# Patient Record
Sex: Female | Born: 1935
Health system: Southern US, Community
[De-identification: ages and names within clinical notes are randomized; demographics above are authoritative.]

## PROBLEM LIST (undated history)

## (undated) DIAGNOSIS — E785 Hyperlipidemia, unspecified: Secondary | ICD-10-CM

## (undated) DIAGNOSIS — H409 Unspecified glaucoma: Secondary | ICD-10-CM

## (undated) DIAGNOSIS — N183 Chronic kidney disease, stage 3 unspecified: Secondary | ICD-10-CM

## (undated) DIAGNOSIS — I10 Essential (primary) hypertension: Secondary | ICD-10-CM

## (undated) DIAGNOSIS — E119 Type 2 diabetes mellitus without complications: Secondary | ICD-10-CM

## (undated) DIAGNOSIS — H547 Unspecified visual loss: Secondary | ICD-10-CM

## (undated) DIAGNOSIS — E7800 Pure hypercholesterolemia, unspecified: Secondary | ICD-10-CM

## (undated) DIAGNOSIS — M899 Disorder of bone, unspecified: Secondary | ICD-10-CM

## (undated) DIAGNOSIS — M949 Disorder of cartilage, unspecified: Secondary | ICD-10-CM

## (undated) DIAGNOSIS — E669 Obesity, unspecified: Secondary | ICD-10-CM

## (undated) DIAGNOSIS — H9190 Unspecified hearing loss, unspecified ear: Secondary | ICD-10-CM

## (undated) DIAGNOSIS — J310 Chronic rhinitis: Secondary | ICD-10-CM

## (undated) HISTORY — DX: Unspecified glaucoma: H40.9

## (undated) HISTORY — DX: Unspecified hearing loss, unspecified ear: H91.90

## (undated) HISTORY — DX: Essential (primary) hypertension: I10

## (undated) HISTORY — DX: Unspecified visual loss: H54.7

## (undated) HISTORY — DX: Disorder of cartilage, unspecified: M94.9

## (undated) HISTORY — DX: Type 2 diabetes mellitus without complications: E11.9

## (undated) HISTORY — DX: Obesity, unspecified: E66.9

## (undated) HISTORY — DX: Hyperlipidemia, unspecified: E78.5

## (undated) HISTORY — PX: TOTAL ABDOMINAL HYSTERECTOMY: SHX209

## (undated) HISTORY — DX: Pure hypercholesterolemia, unspecified: E78.00

## (undated) HISTORY — DX: Chronic rhinitis: J31.0

## (undated) HISTORY — DX: Disorder of bone, unspecified: M89.9

---

## 1997-11-18 ENCOUNTER — Other Ambulatory Visit: Admission: RE | Admit: 1997-11-18 | Discharge: 1997-11-18 | Payer: Self-pay | Admitting: *Deleted

## 2000-11-01 ENCOUNTER — Encounter: Admission: RE | Admit: 2000-11-01 | Discharge: 2000-11-01 | Payer: Self-pay | Admitting: Internal Medicine

## 2001-03-07 ENCOUNTER — Encounter: Admission: RE | Admit: 2001-03-07 | Discharge: 2001-06-05 | Payer: Self-pay | Admitting: Internal Medicine

## 2004-04-04 ENCOUNTER — Ambulatory Visit: Payer: Self-pay | Admitting: Internal Medicine

## 2004-05-17 ENCOUNTER — Ambulatory Visit: Payer: Self-pay | Admitting: Internal Medicine

## 2004-05-23 ENCOUNTER — Ambulatory Visit: Payer: Self-pay | Admitting: Internal Medicine

## 2004-05-24 ENCOUNTER — Ambulatory Visit: Payer: Self-pay | Admitting: Internal Medicine

## 2004-07-25 ENCOUNTER — Ambulatory Visit: Payer: Self-pay | Admitting: Internal Medicine

## 2004-08-01 ENCOUNTER — Ambulatory Visit: Payer: Self-pay | Admitting: Internal Medicine

## 2004-08-08 ENCOUNTER — Ambulatory Visit: Payer: Self-pay | Admitting: Internal Medicine

## 2004-08-22 ENCOUNTER — Ambulatory Visit: Payer: Self-pay | Admitting: Internal Medicine

## 2004-08-23 ENCOUNTER — Ambulatory Visit: Payer: Self-pay | Admitting: Internal Medicine

## 2004-10-03 ENCOUNTER — Ambulatory Visit: Payer: Self-pay | Admitting: Internal Medicine

## 2004-10-06 ENCOUNTER — Ambulatory Visit: Payer: Self-pay | Admitting: Internal Medicine

## 2004-11-18 ENCOUNTER — Ambulatory Visit: Payer: Self-pay | Admitting: Internal Medicine

## 2004-11-21 ENCOUNTER — Ambulatory Visit: Payer: Self-pay | Admitting: Pulmonary Disease

## 2004-12-02 ENCOUNTER — Ambulatory Visit: Payer: Self-pay | Admitting: Pulmonary Disease

## 2004-12-08 ENCOUNTER — Ambulatory Visit: Payer: Self-pay | Admitting: Pulmonary Disease

## 2004-12-30 ENCOUNTER — Ambulatory Visit: Payer: Self-pay | Admitting: Internal Medicine

## 2005-01-06 ENCOUNTER — Ambulatory Visit: Payer: Self-pay | Admitting: Internal Medicine

## 2005-01-10 ENCOUNTER — Ambulatory Visit: Payer: Self-pay | Admitting: Cardiovascular Disease

## 2005-01-12 ENCOUNTER — Ambulatory Visit: Payer: Self-pay | Admitting: Internal Medicine

## 2005-01-23 ENCOUNTER — Ambulatory Visit: Payer: Self-pay | Admitting: Internal Medicine

## 2005-02-10 ENCOUNTER — Ambulatory Visit: Payer: Self-pay | Admitting: Internal Medicine

## 2005-02-13 ENCOUNTER — Ambulatory Visit: Payer: Self-pay | Admitting: Internal Medicine

## 2005-04-17 ENCOUNTER — Ambulatory Visit: Payer: Self-pay | Admitting: Internal Medicine

## 2005-07-12 ENCOUNTER — Encounter: Admission: RE | Admit: 2005-07-12 | Discharge: 2005-07-12 | Payer: Self-pay | Admitting: Internal Medicine

## 2005-07-27 ENCOUNTER — Ambulatory Visit: Payer: Self-pay | Admitting: Internal Medicine

## 2005-07-28 ENCOUNTER — Ambulatory Visit: Payer: Self-pay | Admitting: Internal Medicine

## 2005-11-03 ENCOUNTER — Ambulatory Visit: Payer: Self-pay | Admitting: Internal Medicine

## 2005-12-11 ENCOUNTER — Ambulatory Visit: Payer: Self-pay | Admitting: Internal Medicine

## 2006-01-29 ENCOUNTER — Ambulatory Visit: Payer: Self-pay | Admitting: Internal Medicine

## 2006-04-23 ENCOUNTER — Ambulatory Visit: Payer: Self-pay | Admitting: Internal Medicine

## 2006-04-23 LAB — CONVERTED CEMR LAB
ALT: 16 units/L (ref 0–40)
AST: 18 units/L (ref 0–37)
Albumin: 3.8 g/dL (ref 3.5–5.2)
Alkaline Phosphatase: 33 units/L — ABNORMAL LOW (ref 39–117)
BUN: 12 mg/dL (ref 6–23)
Bacteria, U Microscopic: NEGATIVE /hpf
Basophils Absolute: 0 10*3/uL (ref 0.0–0.1)
Basophils Relative: 0.6 % (ref 0.0–1.0)
Bilirubin Urine: NEGATIVE
CO2: 29 meq/L (ref 19–32)
Calcium: 9.2 mg/dL (ref 8.4–10.5)
Chloride: 107 meq/L (ref 96–112)
Chol/HDL Ratio, serum: 3.1
Cholesterol: 198 mg/dL (ref 0–200)
Creatinine, Ser: 0.7 mg/dL (ref 0.4–1.2)
Crystals: NEGATIVE
Eosinophil percent: 4.9 % (ref 0.0–5.0)
GFR calc non Af Amer: 86 mL/min
Glomerular Filtration Rate, Af Am: 104 mL/min/{1.73_m2}
Glucose, Bld: 126 mg/dL — ABNORMAL HIGH (ref 70–99)
HCT: 37.1 % (ref 36.0–46.0)
HDL: 63 mg/dL (ref >39.0)
Hemoglobin, Urine: NEGATIVE
Hemoglobin: 12.5 g/dL (ref 12.0–15.0)
Hgb A1c MFr Bld: 6.9 % — ABNORMAL HIGH (ref 4.6–6.0)
Ketones, ur: NEGATIVE mg/dL
LDL Cholesterol: 103 mg/dL — ABNORMAL HIGH (ref 0–99)
Lymphocytes Relative: 25.9 % (ref 12.0–46.0)
MCHC: 33.6 g/dL (ref 30.0–36.0)
MCV: 89.3 fL (ref 78.0–100.0)
Monocytes Absolute: 0.4 10*3/uL (ref 0.2–0.7)
Monocytes Relative: 8 % (ref 3.0–11.0)
Mucus, UA: NEGATIVE
Neutro Abs: 3.2 10*3/uL (ref 1.4–7.7)
Neutrophils Relative %: 60.6 % (ref 43.0–77.0)
Nitrite: NEGATIVE
Platelets: 206 10*3/uL (ref 150–400)
Potassium: 4.4 meq/L (ref 3.5–5.1)
RBC: 4.15 M/uL (ref 3.87–5.11)
RDW: 12.8 % (ref 11.5–14.6)
Sodium: 143 meq/L (ref 135–145)
Specific Gravity, Urine: 1.015 (ref 1.000–1.03)
TSH: 2.68 microintl units/mL (ref 0.35–5.50)
Total Bilirubin: 0.7 mg/dL (ref 0.3–1.2)
Total Protein, Urine: NEGATIVE mg/dL
Total Protein: 6.5 g/dL (ref 6.0–8.3)
Triglyceride fasting, serum: 160 mg/dL — ABNORMAL HIGH (ref 0–149)
Urine Glucose: NEGATIVE mg/dL
Urobilinogen, UA: 0.2 (ref 0.0–1.0)
VLDL: 32 mg/dL (ref 0–40)
WBC: 5.3 10*3/uL (ref 4.5–10.5)
pH: 6 (ref 5.0–8.0)

## 2006-05-24 ENCOUNTER — Ambulatory Visit: Payer: Self-pay | Admitting: Internal Medicine

## 2006-08-13 ENCOUNTER — Ambulatory Visit: Payer: Self-pay | Admitting: Internal Medicine

## 2006-08-13 LAB — CONVERTED CEMR LAB
Chloride: 103 meq/L (ref 96–112)
Cholesterol: 247 mg/dL (ref 0–200)
Creatinine, Ser: 0.6 mg/dL (ref 0.4–1.2)
Glucose, Bld: 144 mg/dL — ABNORMAL HIGH (ref 70–99)
Hgb A1c MFr Bld: 7 % — ABNORMAL HIGH (ref 4.6–6.0)
Sodium: 139 meq/L (ref 135–145)
VLDL: 44 mg/dL — ABNORMAL HIGH (ref 0–40)

## 2006-11-19 ENCOUNTER — Ambulatory Visit: Payer: Self-pay | Admitting: Internal Medicine

## 2006-11-19 LAB — CONVERTED CEMR LAB
BUN: 10 mg/dL (ref 6–23)
Basophils Absolute: 0 10*3/uL (ref 0.0–0.1)
Bilirubin, Direct: 0.1 mg/dL (ref 0.0–0.3)
CRP, High Sensitivity: 1 (ref 0.00–5.00)
Calcium: 9.2 mg/dL (ref 8.4–10.5)
Chloride: 111 meq/L (ref 96–112)
Direct LDL: 118.6 mg/dL
Eosinophils Absolute: 0.2 10*3/uL (ref 0.0–0.6)
Eosinophils Relative: 3.3 % (ref 0.0–5.0)
GFR calc Af Amer: 124 mL/min
Glucose, Bld: 141 mg/dL — ABNORMAL HIGH (ref 70–99)
HDL: 56.4 mg/dL (ref >39.0)
Hgb A1c MFr Bld: 7 % — ABNORMAL HIGH (ref 4.6–6.0)
MCHC: 34.2 g/dL (ref 30.0–36.0)
MCV: 87 fL (ref 78.0–100.0)
Platelets: 201 10*3/uL (ref 150–400)
Potassium: 4.3 meq/L (ref 3.5–5.1)
RBC: 4.12 M/uL (ref 3.87–5.11)
Sodium: 143 meq/L (ref 135–145)
Total Bilirubin: 0.8 mg/dL (ref 0.3–1.2)
Total Protein: 6.5 g/dL (ref 6.0–8.3)
Triglycerides: 203 mg/dL (ref 0–149)
WBC: 6.2 10*3/uL (ref 4.5–10.5)

## 2007-02-28 ENCOUNTER — Ambulatory Visit: Payer: Self-pay | Admitting: Internal Medicine

## 2007-02-28 LAB — CONVERTED CEMR LAB
Cholesterol: 219 mg/dL (ref 0–200)
Direct LDL: 127.6 mg/dL
Hgb A1c MFr Bld: 6.9 % — ABNORMAL HIGH (ref 4.6–6.0)
Total CHOL/HDL Ratio: 3.8
VLDL: 38 mg/dL (ref 0–40)
Vit D, 1,25-Dihydroxy: 43 (ref 30–89)

## 2007-06-10 DIAGNOSIS — E119 Type 2 diabetes mellitus without complications: Secondary | ICD-10-CM | POA: Insufficient documentation

## 2007-06-10 DIAGNOSIS — Z9189 Other specified personal risk factors, not elsewhere classified: Secondary | ICD-10-CM

## 2007-06-10 DIAGNOSIS — E1169 Type 2 diabetes mellitus with other specified complication: Secondary | ICD-10-CM | POA: Insufficient documentation

## 2007-06-10 DIAGNOSIS — E785 Hyperlipidemia, unspecified: Secondary | ICD-10-CM

## 2007-06-10 DIAGNOSIS — E7800 Pure hypercholesterolemia, unspecified: Secondary | ICD-10-CM | POA: Insufficient documentation

## 2007-06-10 DIAGNOSIS — E669 Obesity, unspecified: Secondary | ICD-10-CM

## 2007-06-10 DIAGNOSIS — J310 Chronic rhinitis: Secondary | ICD-10-CM

## 2007-06-11 ENCOUNTER — Ambulatory Visit: Payer: Self-pay | Admitting: Internal Medicine

## 2007-06-11 LAB — CONVERTED CEMR LAB
Creatinine, Ser: 0.5 mg/dL (ref 0.4–1.2)
GFR calc Af Amer: 153 mL/min
Hgb A1c MFr Bld: 7 % — ABNORMAL HIGH (ref 4.6–6.0)
Potassium: 4.6 meq/L (ref 3.5–5.1)
Sodium: 140 meq/L (ref 135–145)

## 2007-06-24 ENCOUNTER — Telehealth (INDEPENDENT_AMBULATORY_CARE_PROVIDER_SITE_OTHER): Payer: Self-pay | Admitting: *Deleted

## 2007-09-05 ENCOUNTER — Encounter: Payer: Self-pay | Admitting: Internal Medicine

## 2007-09-12 ENCOUNTER — Ambulatory Visit: Payer: Self-pay | Admitting: Internal Medicine

## 2007-09-12 DIAGNOSIS — I10 Essential (primary) hypertension: Secondary | ICD-10-CM | POA: Insufficient documentation

## 2007-09-19 ENCOUNTER — Ambulatory Visit: Payer: Self-pay | Admitting: Internal Medicine

## 2007-09-19 DIAGNOSIS — J209 Acute bronchitis, unspecified: Secondary | ICD-10-CM | POA: Insufficient documentation

## 2007-12-30 ENCOUNTER — Ambulatory Visit: Payer: Self-pay | Admitting: Internal Medicine

## 2007-12-30 ENCOUNTER — Encounter: Payer: Self-pay | Admitting: Internal Medicine

## 2007-12-31 ENCOUNTER — Ambulatory Visit: Payer: Self-pay | Admitting: Internal Medicine

## 2008-01-01 LAB — CONVERTED CEMR LAB
ALT: 16 units/L (ref 0–35)
AST: 17 units/L (ref 0–37)
Albumin: 3.5 g/dL (ref 3.5–5.2)
Alkaline Phosphatase: 39 units/L (ref 39–117)
BUN: 14 mg/dL (ref 6–23)
Bilirubin, Direct: 0.1 mg/dL (ref 0.0–0.3)
CO2: 30 meq/L (ref 19–32)
Calcium: 9.1 mg/dL (ref 8.4–10.5)
Chloride: 103 meq/L (ref 96–112)
Cholesterol: 190 mg/dL (ref 0–200)
Creatinine, Ser: 0.6 mg/dL (ref 0.4–1.2)
GFR calc Af Amer: 124 mL/min
GFR calc non Af Amer: 102 mL/min
Glucose, Bld: 146 mg/dL — ABNORMAL HIGH (ref 70–99)
HDL: 50 mg/dL (ref >39.0)
Hgb A1c MFr Bld: 7.3 % — ABNORMAL HIGH (ref 4.6–6.0)
LDL Cholesterol: 111 mg/dL — ABNORMAL HIGH (ref 0–99)
Potassium: 4.3 meq/L (ref 3.5–5.1)
Sodium: 140 meq/L (ref 135–145)
Total Bilirubin: 0.8 mg/dL (ref 0.3–1.2)
Total CHOL/HDL Ratio: 3.8
Total Protein: 6.3 g/dL (ref 6.0–8.3)
Triglycerides: 146 mg/dL (ref 0–149)
VLDL: 29 mg/dL (ref 0–40)

## 2008-01-03 LAB — CONVERTED CEMR LAB: Vit D, 1,25-Dihydroxy: 37 (ref 30–89)

## 2008-04-02 ENCOUNTER — Ambulatory Visit: Payer: Self-pay | Admitting: Internal Medicine

## 2008-04-02 LAB — CONVERTED CEMR LAB
BUN: 16 mg/dL (ref 6–23)
CO2: 28 meq/L (ref 19–32)
Calcium: 10.5 mg/dL (ref 8.4–10.5)
Chloride: 103 meq/L (ref 96–112)
Creatinine, Ser: 0.6 mg/dL (ref 0.4–1.2)
GFR calc Af Amer: 124 mL/min
GFR calc non Af Amer: 102 mL/min
Glucose, Bld: 176 mg/dL — ABNORMAL HIGH (ref 70–99)
Hgb A1c MFr Bld: 7.2 % — ABNORMAL HIGH (ref 4.6–6.0)
Potassium: 4.9 meq/L (ref 3.5–5.1)
Sodium: 144 meq/L (ref 135–145)

## 2008-05-05 ENCOUNTER — Ambulatory Visit: Payer: Self-pay | Admitting: Internal Medicine

## 2008-06-04 ENCOUNTER — Emergency Department (HOSPITAL_COMMUNITY): Admission: EM | Admit: 2008-06-04 | Discharge: 2008-06-04 | Payer: Self-pay | Admitting: Emergency Medicine

## 2008-06-05 ENCOUNTER — Telehealth (INDEPENDENT_AMBULATORY_CARE_PROVIDER_SITE_OTHER): Payer: Self-pay | Admitting: *Deleted

## 2008-06-09 ENCOUNTER — Ambulatory Visit: Payer: Self-pay | Admitting: Internal Medicine

## 2008-06-09 DIAGNOSIS — N3000 Acute cystitis without hematuria: Secondary | ICD-10-CM

## 2008-06-11 LAB — CONVERTED CEMR LAB
Bilirubin Urine: NEGATIVE
Mucus, UA: NEGATIVE
Nitrite: NEGATIVE
RBC / HPF: NONE SEEN
Specific Gravity, Urine: 1.01 (ref 1.000–1.03)
Total Protein, Urine: NEGATIVE mg/dL
pH: 6 (ref 5.0–8.0)

## 2008-06-24 ENCOUNTER — Emergency Department (HOSPITAL_COMMUNITY): Admission: EM | Admit: 2008-06-24 | Discharge: 2008-06-24 | Payer: Self-pay | Admitting: Emergency Medicine

## 2008-06-25 ENCOUNTER — Telehealth: Payer: Self-pay | Admitting: Adult Health

## 2008-06-25 DIAGNOSIS — R1900 Intra-abdominal and pelvic swelling, mass and lump, unspecified site: Secondary | ICD-10-CM | POA: Insufficient documentation

## 2008-07-10 ENCOUNTER — Encounter: Payer: Self-pay | Admitting: Internal Medicine

## 2008-07-13 ENCOUNTER — Inpatient Hospital Stay (HOSPITAL_COMMUNITY): Admission: RE | Admit: 2008-07-13 | Discharge: 2008-07-15 | Payer: Self-pay | Admitting: Obstetrics and Gynecology

## 2008-07-13 ENCOUNTER — Encounter (INDEPENDENT_AMBULATORY_CARE_PROVIDER_SITE_OTHER): Payer: Self-pay | Admitting: Obstetrics and Gynecology

## 2008-08-04 ENCOUNTER — Ambulatory Visit: Payer: Self-pay | Admitting: Internal Medicine

## 2008-08-04 DIAGNOSIS — J381 Polyp of vocal cord and larynx: Secondary | ICD-10-CM

## 2008-08-18 ENCOUNTER — Ambulatory Visit: Payer: Self-pay | Admitting: Internal Medicine

## 2008-08-18 ENCOUNTER — Telehealth (INDEPENDENT_AMBULATORY_CARE_PROVIDER_SITE_OTHER): Payer: Self-pay | Admitting: *Deleted

## 2008-09-22 ENCOUNTER — Encounter: Payer: Self-pay | Admitting: Internal Medicine

## 2008-11-05 ENCOUNTER — Ambulatory Visit: Payer: Self-pay | Admitting: Internal Medicine

## 2008-11-05 LAB — CONVERTED CEMR LAB
Albumin: 3.9 g/dL (ref 3.5–5.2)
BUN: 14 mg/dL (ref 6–23)
Basophils Relative: 0.4 % (ref 0.0–3.0)
CRP, High Sensitivity: 2 (ref 0.00–5.00)
Calcium: 9.4 mg/dL (ref 8.4–10.5)
Cholesterol: 225 mg/dL — ABNORMAL HIGH (ref 0–200)
Creatinine, Ser: 0.5 mg/dL (ref 0.4–1.2)
Direct LDL: 123 mg/dL
Eosinophils Relative: 3.6 % (ref 0.0–5.0)
GFR calc non Af Amer: 125.76 mL/min (ref >60)
Glucose, Bld: 123 mg/dL — ABNORMAL HIGH (ref 70–99)
HCT: 34 % — ABNORMAL LOW (ref 36.0–46.0)
HDL: 63.2 mg/dL (ref >39.00)
Hemoglobin, Urine: NEGATIVE
Lymphs Abs: 1.5 10*3/uL (ref 0.7–4.0)
MCV: 87.6 fL (ref 78.0–100.0)
Monocytes Absolute: 0.4 10*3/uL (ref 0.1–1.0)
Monocytes Relative: 7.4 % (ref 3.0–12.0)
Nitrite: NEGATIVE
RBC: 3.89 M/uL (ref 3.87–5.11)
Specific Gravity, Urine: 1.01 (ref 1.000–1.030)
TSH: 2.96 microintl units/mL (ref 0.35–5.50)
Total Bilirubin: 0.9 mg/dL (ref 0.3–1.2)
Total Protein, Urine: NEGATIVE mg/dL
Triglycerides: 262 mg/dL — ABNORMAL HIGH (ref 0.0–149.0)
Urine Glucose: NEGATIVE mg/dL
Urobilinogen, UA: 0.2 (ref 0.0–1.0)
VLDL: 52.4 mg/dL — ABNORMAL HIGH (ref 0.0–40.0)
WBC: 5.5 10*3/uL (ref 4.5–10.5)

## 2009-02-02 ENCOUNTER — Ambulatory Visit: Payer: Self-pay | Admitting: Internal Medicine

## 2009-02-02 LAB — CONVERTED CEMR LAB
Albumin: 3.8 g/dL (ref 3.5–5.2)
BUN: 16 mg/dL (ref 6–23)
Calcium: 9.5 mg/dL (ref 8.4–10.5)
Cholesterol: 245 mg/dL — ABNORMAL HIGH (ref 0–200)
GFR calc non Af Amer: 125.69 mL/min (ref >60)
Glucose, Bld: 131 mg/dL — ABNORMAL HIGH (ref 70–99)
HDL: 52.4 mg/dL (ref >39.00)
Potassium: 4.5 meq/L (ref 3.5–5.1)
Total Bilirubin: 0.8 mg/dL (ref 0.3–1.2)
Total CHOL/HDL Ratio: 5
Triglycerides: 226 mg/dL — ABNORMAL HIGH (ref 0.0–149.0)
VLDL: 45.2 mg/dL — ABNORMAL HIGH (ref 0.0–40.0)

## 2009-03-12 ENCOUNTER — Ambulatory Visit: Payer: Self-pay | Admitting: Internal Medicine

## 2009-03-17 ENCOUNTER — Telehealth: Payer: Self-pay | Admitting: Internal Medicine

## 2009-03-17 ENCOUNTER — Ambulatory Visit: Payer: Self-pay | Admitting: Internal Medicine

## 2009-03-22 ENCOUNTER — Telehealth: Payer: Self-pay | Admitting: Internal Medicine

## 2009-04-23 ENCOUNTER — Encounter: Payer: Self-pay | Admitting: Internal Medicine

## 2009-05-28 ENCOUNTER — Ambulatory Visit: Payer: Self-pay | Admitting: Internal Medicine

## 2009-05-31 LAB — CONVERTED CEMR LAB
Albumin: 4.2 g/dL (ref 3.5–5.2)
Alkaline Phosphatase: 43 units/L (ref 39–117)
BUN: 17 mg/dL (ref 6–23)
Bilirubin, Direct: 0.1 mg/dL (ref 0.0–0.3)
CO2: 18 meq/L — ABNORMAL LOW (ref 19–32)
Calcium: 10 mg/dL (ref 8.4–10.5)
Chloride: 102 meq/L (ref 96–112)
Creatinine, Ser: 0.58 mg/dL (ref 0.40–1.20)
Glucose, Bld: 118 mg/dL — ABNORMAL HIGH (ref 70–99)
HDL: 57 mg/dL (ref >39)
LDL Cholesterol: 114 mg/dL — ABNORMAL HIGH (ref 0–99)
Total Bilirubin: 0.5 mg/dL (ref 0.3–1.2)

## 2009-08-06 ENCOUNTER — Ambulatory Visit: Payer: Self-pay | Admitting: Internal Medicine

## 2009-08-06 DIAGNOSIS — J4520 Mild intermittent asthma, uncomplicated: Secondary | ICD-10-CM | POA: Insufficient documentation

## 2009-08-27 ENCOUNTER — Ambulatory Visit: Payer: Self-pay | Admitting: Internal Medicine

## 2009-08-30 LAB — CONVERTED CEMR LAB
ALT: 17 units/L (ref 0–35)
AST: 17 units/L (ref 0–37)
BUN: 12 mg/dL (ref 6–23)
Bilirubin, Direct: 0.1 mg/dL (ref 0.0–0.3)
CO2: 30 meq/L (ref 19–32)
Chloride: 102 meq/L (ref 96–112)
Cholesterol: 276 mg/dL — ABNORMAL HIGH (ref 0–200)
Hgb A1c MFr Bld: 7.2 % — ABNORMAL HIGH (ref 4.6–6.5)
Potassium: 4.3 meq/L (ref 3.5–5.1)
Total Bilirubin: 0.6 mg/dL (ref 0.3–1.2)
Total CHOL/HDL Ratio: 5
Total Protein: 6.8 g/dL (ref 6.0–8.3)

## 2009-11-25 ENCOUNTER — Ambulatory Visit: Payer: Self-pay | Admitting: Internal Medicine

## 2009-11-25 LAB — CONVERTED CEMR LAB
Albumin: 4.3 g/dL (ref 3.5–5.2)
Alkaline Phosphatase: 40 units/L (ref 39–117)
Basophils Absolute: 0 10*3/uL (ref 0.0–0.1)
Bilirubin, Direct: 0.1 mg/dL (ref 0.0–0.3)
CO2: 30 meq/L (ref 19–32)
Calcium: 9.6 mg/dL (ref 8.4–10.5)
Cholesterol: 305 mg/dL — ABNORMAL HIGH (ref 0–200)
Creatinine, Ser: 0.5 mg/dL (ref 0.4–1.2)
Creatinine,U: 64.2 mg/dL
Eosinophils Absolute: 0.3 10*3/uL (ref 0.0–0.7)
Glucose, Bld: 124 mg/dL — ABNORMAL HIGH (ref 70–99)
HDL: 72.6 mg/dL (ref >39.00)
Ketones, ur: NEGATIVE mg/dL
Lymphocytes Relative: 22.8 % (ref 12.0–46.0)
MCHC: 34.8 g/dL (ref 30.0–36.0)
Microalb Creat Ratio: 0.9 mg/g (ref 0.0–30.0)
Neutrophils Relative %: 66 % (ref 43.0–77.0)
Platelets: 206 10*3/uL (ref 150.0–400.0)
RDW: 13.6 % (ref 11.5–14.6)
Specific Gravity, Urine: 1.015 (ref 1.000–1.030)
Total Bilirubin: 0.7 mg/dL (ref 0.3–1.2)
Triglycerides: 316 mg/dL — ABNORMAL HIGH (ref 0.0–149.0)
Urobilinogen, UA: 0.2 (ref 0.0–1.0)

## 2010-01-18 IMAGING — CR DG RIBS 2V*L*
1 series · 1 of 1 positions shown · non-contrast
Comparison: 04/23/2006

CLINICAL DATA: Left chest pain

LEFT RIBS - 2 VIEW

[view not recorded]
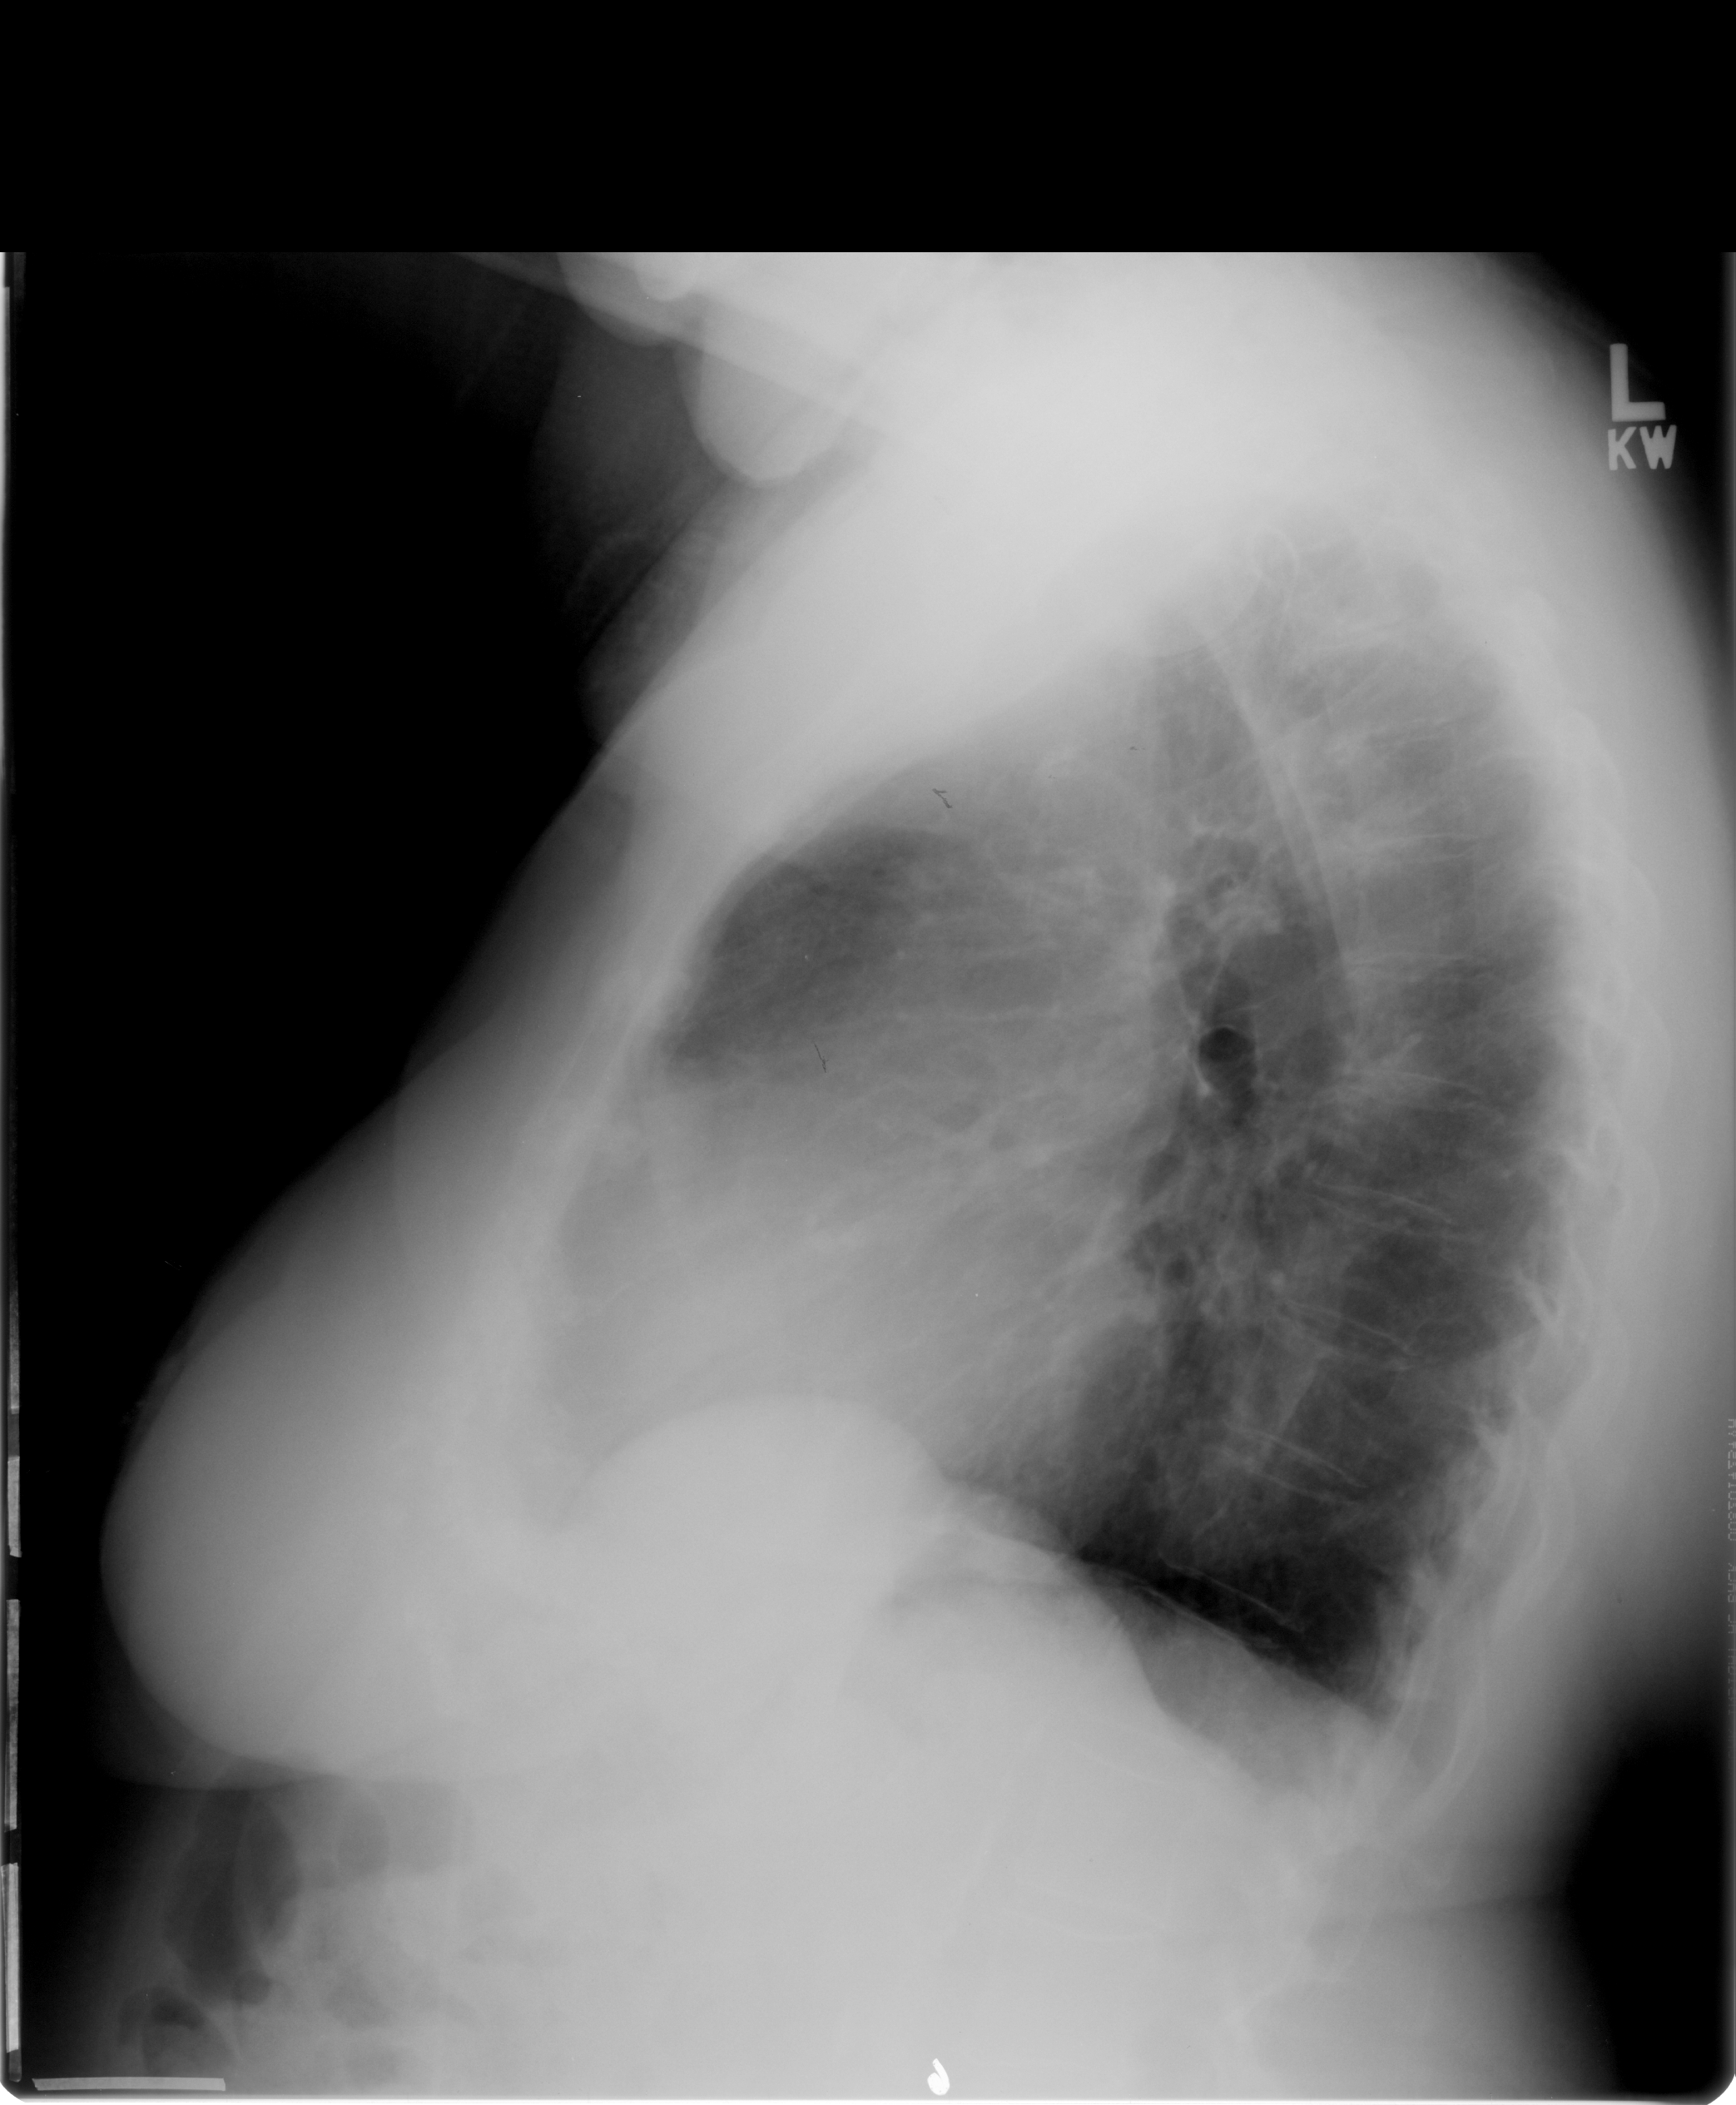

[1 of 1 positions shown; findings below may reference images not displayed]

FINDINGS: Two detailed views of left ribs show no displaced
fracture or other focal lesion.  No definite pneumothorax or
effusion although a standard chest radiograph was not obtained.
Atheromatous   aorta.
IMPRESSION: 1.  Negative for displaced fracture or associated complication.

## 2010-02-10 ENCOUNTER — Ambulatory Visit: Payer: Self-pay | Admitting: Internal Medicine

## 2010-02-23 ENCOUNTER — Ambulatory Visit: Payer: Self-pay | Admitting: Internal Medicine

## 2010-02-23 ENCOUNTER — Encounter: Payer: Self-pay | Admitting: Internal Medicine

## 2010-03-23 ENCOUNTER — Ambulatory Visit: Payer: Self-pay | Admitting: Internal Medicine

## 2010-03-24 LAB — CONVERTED CEMR LAB
CO2: 31 meq/L (ref 19–32)
Calcium: 9.6 mg/dL (ref 8.4–10.5)
Glucose, Bld: 142 mg/dL — ABNORMAL HIGH (ref 70–99)
Potassium: 4.7 meq/L (ref 3.5–5.1)
Sodium: 141 meq/L (ref 135–145)

## 2010-04-13 ENCOUNTER — Ambulatory Visit: Payer: Self-pay | Admitting: Internal Medicine

## 2010-04-13 DIAGNOSIS — D239 Other benign neoplasm of skin, unspecified: Secondary | ICD-10-CM | POA: Insufficient documentation

## 2010-04-18 LAB — CONVERTED CEMR LAB
ALT: 18 units/L (ref 0–35)
AST: 18 units/L (ref 0–37)
Alkaline Phosphatase: 44 units/L (ref 39–117)
BUN: 18 mg/dL (ref 6–23)
Bilirubin, Direct: 0.1 mg/dL (ref 0.0–0.3)
Calcium: 9.4 mg/dL (ref 8.4–10.5)
Cholesterol: 266 mg/dL — ABNORMAL HIGH (ref 0–200)
Creatinine, Ser: 0.5 mg/dL (ref 0.4–1.2)
GFR calc non Af Amer: 117.17 mL/min (ref >60)
Total Protein: 6.8 g/dL (ref 6.0–8.3)

## 2010-06-12 LAB — CONVERTED CEMR LAB
ALT: 16 units/L (ref 0–35)
AST: 19 units/L (ref 0–37)
Albumin: 3.8 g/dL (ref 3.5–5.2)
BUN: 13 mg/dL (ref 6–23)
Bacteria, UA: NEGATIVE
Basophils Absolute: 0 10*3/uL (ref 0.0–0.1)
Basophils Relative: 0.5 % (ref 0.0–1.0)
Bilirubin Urine: NEGATIVE
CO2: 32 meq/L (ref 19–32)
Chloride: 104 meq/L (ref 96–112)
Creatinine, Ser: 0.6 mg/dL (ref 0.4–1.2)
HCT: 34.7 % — ABNORMAL LOW (ref 36.0–46.0)
Hemoglobin: 12.1 g/dL (ref 12.0–15.0)
Hgb A1c MFr Bld: 7 % — ABNORMAL HIGH (ref 4.6–6.0)
Ketones, ur: NEGATIVE mg/dL
Lymphocytes Relative: 26.8 % (ref 12.0–46.0)
MCHC: 34.7 g/dL (ref 30.0–36.0)
Monocytes Relative: 7 % (ref 3.0–12.0)
Mucus, UA: NEGATIVE
Neutro Abs: 3 10*3/uL (ref 1.4–7.7)
RBC: 3.89 M/uL (ref 3.87–5.11)
Squamous Epithelial / LPF: NEGATIVE /lpf
Total Bilirubin: 0.7 mg/dL (ref 0.3–1.2)
Total Protein, Urine: NEGATIVE mg/dL
VLDL: 47 mg/dL — ABNORMAL HIGH (ref 0–40)
pH: 6 (ref 5.0–8.0)

## 2010-06-14 NOTE — Assessment & Plan Note (Signed)
Summary: Primary svc/ ext ov with hfa teaching 50%   Primary Provider/Referring Provider:  Sherene Sires  CC:  Acute visit.  Pt c/o cough and increased SOB x 2 days.  Cough is prod with minimal yellow sputum.  She also c/o "stuffy head".  .  History of Present Illness: 63 yowf never smoker with moderate obesity> adult onset diabetes dx  2002    December 31, 2007  c/o  chronic is bilateral  nocturnal muscle cramps for which quinine works great but she is having more difficulty finding it.  The other complaint she has is persistent nasal congestion with sensation of water in drainage, especially when she lies down at night.  She admits she has not been compliant with Nasarel.--A1C   7.3 advised on diet mananagement.   April 02, 2008 -- follow up and med review. rec increase actos 15 to 2 daily because of hgba1c of 7.2 with pc blood sugar of 176  August 04, 2008 post op visit s/p resection of benign ovarian mass and hysterectomy 07/13/08 and slowly regaining full function, minimal residual pain.    February 02, 2009 ov no med calendar.  no hypoglycemic spells, tia, claudication. rec use med calendar,  no change in rx  March 17, 2009 ov Acute visit.  Pt c/o cough x 3 days.  Cough is prod with yellow sputum.  She also c/o wheezing.  All started 10/29 with scratchy throat and nasal congestion. on timolol eye drops with no h/o asthma.  rec change to betoptic > resolved.  August 06, 2009 Acute visit.  Pt c/o cough and increased SOB x 2 days.  Cough is prod with minimal yellow sputum.  She also c/o "stuffy head" x 3 days and subjective wheezing.  No chronic symptoms of wheeze or sob.  Pt denies any significant sore throat, dysphagia, itching, sneezing,  nasal congestion or excess secretions,  fever, chills, sweats, unintended wt loss, pleuritic or exertional cp, hempoptysis, change in activity tolerance  orthopnea pnd or leg swelling.   Current Medications (verified): 1)  Actos 15 Mg  Tabs (Pioglitazone Hcl)  .... Take 1 Tab By Mouth Once Daily For Blood Sugar 2)  Zetia 10 Mg  Tabs (Ezetimibe) .... Once Daily 3)  Vitamin D 1000 Unit  Caps (Cholecalciferol) .... One Daily 4)  Timolol .... At Bedtime 5)  Travatan 0.004 % Soln (Travoprost) .Marland Kitchen.. 1 Drop Each Eye Once Daily 6)  Centrum Silver   Tabs (Multiple Vitamins-Minerals) .... Once Daily 7)  Bayer Aspirin 325 Mg  Tabs (Aspirin) .... Once Daily 8)  Tums 500 Mg  Chew (Calcium Carbonate Antacid) .... Before Meals and At Bedtime 9)  Claritin 10 Mg  Tabs (Loratadine) .... Once Daily As Needed 10)  Delsym 30 Mg/96ml  Lqcr (Dextromethorphan Polistirex) .... 2 Tsp Every 12 Hours As Needed 11)  Advil Cold & Sinus Liqui-Gels 30-200 Mg  Caps (Pseudoephedrine-Ibuprofen) .... As Needed 12)  Advil 200 Mg  Caps (Ibuprofen) .... As Needed  Allergies (verified): 1)  ! Lipitor 2)  ! * Drixoral  Past History:  Past Medical History: ESSENTIAL HYPERTENSION, BENIGN (ICD-401.1) Glaucoma................................................................................[name] HYPERLIPIDEMIA (ICD-272.4)   -  target LDL less than 70 because of diabetes RHINITIS (ICD-472.0) Intermittent asthma     -  OSTEOPENIA (ICD-733.90)    - DEXA  12/28/07 PA Spine .655, L Hip -1.033, Lfem Neck -1.793 OBESITY (ICD-278.00)   - Target wt  =  148  for BMI < 30  AODM (ICD-250.00)   Health Maintenance......................................................................Marland KitchenWert   -  Med calendar done 04/02/08   - Td 11/08   - Pneumovax 5/02   - November 05, 2008 CPX             Vital Signs:  Patient profile:   77 year old female Weight:      165 pounds O2 Sat:      95 % on Room air Temp:     97.8 degrees F oral Pulse rate:   88 / minute BP sitting:   164 / 78  (left arm) Cuff size:   regular  Vitals Entered By: Vernie Murders (August 06, 2009 9:35 AM)  O2 Flow:  Room air  Physical Exam  Additional Exam:  wt 157 > 159 November 05, 2008 > 162 February 02, 2009 > 161  March 17, 2009 > 161 May 29, 2009 > 165 August 06, 2009  HEENT: nl dentition, turbinates, and orophanx. Nl external ear canals without cough reflex NECK :  without JVD/Nodes/TM/ nl carotid upstrokes bilaterally LUNGS: no acc muscle,  mild/ moderate sonorous rhonchi exp > isp bilaterally CV:  RRR  no s3 or murmur or increase in P2, no edema   ABD:  soft and nontender with nl excursion in the supine position. No bruits or organomegaly, bowel sounds nl MS:  warm without deformities, calf tenderness, cyanosis or clubbing SKIN: warm and dry without lesions        Impression & Recommendations:  Problem # 1:  ASTHMA, UNSPECIFIED (ICD-493.90) Previously wheezed with timoptic, now wheezing with apparetn uri vs allergic rhinitis so has intermittent if not chronic asthma.  I spent extra time with the patient today explaining optimal mdi  technique.  This improved from  25-75%   Each maintenance medication was reviewed in detail including most importantly the difference between maintenance and as needed and under what circumstances the prns are to be used. See instructions for specific recommendations   I discussed the importance of understanding the goals of asthma therapy and benefits of using symbicort off label, the way it is used in [place].  Problem # 2:  AODM (ICD-250.00)  Her updated medication list for this problem includes:    Actos 15 Mg Tabs (Pioglitazone hcl) .Marland Kitchen... Take 1 tab by mouth once daily for blood sugar    Bayer Aspirin 325 Mg Tabs (Aspirin) ..... Once daily  Labs Reviewed: Creat: 0.58 (05/28/2009)    Reviewed HgBA1c results: 7.0 (05/28/2009)  6.7 (02/02/2009)  Good control chronically so should be able to tolerate short course prednisone  Medications Added to Medication List This Visit: 1)  Prednisone 10 Mg Tabs (Prednisone) .... 4 each am x 2 days,  3 x 2days, 2x2 days, and 1x2 days 2)  Doxycycline Hyclate 100 Mg Caps (Doxycycline hyclate) .... One twice daily x  7 day 3)  Symbicort 80-4.5 Mcg/act Aero (Budesonide-formoterol fumarate) .... 2 puffs first thing  in am and 2 puffs again in pm about 12 hours later  Other Orders: Est. Patient Level IV (46962)  Patient Instructions: 1)  Take prednisone and doxy until complete 2)  Symbicort 80 2 puffs first thing  in am and 2 puffs again in pm about 12 hours later if wheezing or breath  3)  Work on inhaler technique:  relax and blow all the way out then take a nice smooth deep breath back in, triggering the inhaler at same time you start breathing in and hold a few seconds 4)  See calendar for specific medication instructions and bring it back  for each and every office visit for every healthcare provider you see.  Without it,  you may not receive the best quality medical care that we feel you deserve.  Prescriptions: DOXYCYCLINE HYCLATE 100 MG CAPS (DOXYCYCLINE HYCLATE) one twice daily x 7 day  #14 x 0   Entered and Authorized by:   Nyoka Cowden MD   Signed by:   Nyoka Cowden MD on 08/06/2009   Method used:   Electronically to        [facility]* (retail)       [address] [place], [place]  [address]       Ph: [phone] or [phone]       Fax: [phone]   RxID:   [id number] PREDNISONE 10 MG  TABS (PREDNISONE) 4 each am x 2 days,  3 x 2days, 2x2 days, and 1x2 days  #20 x 0   Entered and Authorized by:   Nyoka Cowden MD   Signed by:   Nyoka Cowden MD on 08/06/2009   Method used:   Electronically to        [facility]* (retail)       [address] [place], [place]  [address]       Ph: [phone] or [phone]       Fax: [phone]   RxID:   [id number]

## 2010-06-14 NOTE — Assessment & Plan Note (Signed)
Summary: Primary svc/ f/u asthma/ dm > change actos to metformin   Primary Provider/Referring Provider:  Sherene Sires  CC:  Follow up, denies sob at this time, and needs refill on symbicort and actos.  History of Present Illness: 77 yowf never smoker with moderate obesity> adult onset diabetes dx  2002.  December 31, 2007  c/o  chronic is bilateral  nocturnal muscle cramps for which quinine works great but she is having more difficulty finding it.  The other complaint she has is persistent nasal congestion with sensation of water in drainage, especially when she lies down at night.  She admits she has not been compliant with Nasarel.--A1C   7.3 advised on diet mananagement.   April 02, 2008 -- follow up and med review. rec increase actos 15 to 2 daily because of hgba1c of 7.2 with pc blood sugar of 176  August 04, 2008 post op visit s/p resection of benign ovarian mass and hysterectomy 07/13/08 and slowly regaining full function, minimal residual pain.    February 02, 2009 ov no med calendar.  no hypoglycemic spells, tia, claudication. rec use med calendar,  no change in rx  March 17, 2009 ov Acute visit.  Pt c/o cough x 3 days.  Cough is prod with yellow sputum.  She also c/o wheezing.  All started 10/29 with scratchy throat and nasal congestion. on timolol eye drops with no h/o asthma.  rec change to betoptic > resolved.  August 06, 2009 Acute visit.  Pt c/o cough and increased SOB x 2 days.  Cough is prod with minimal yellow sputum.  She also c/o "stuffy head" x 3 days and subjective wheezing.  No chronic symptoms of wheeze or sob.  Take prednisone and doxy until complete Symbicort 80 2 puffs first thing  in am and 2 puffs again in pm about 12 hours later if wheezing or breath  Work on inhaler technique:  relax and blow all the way out then take a nice smooth deep breath back in, triggering the inhaler at same time you start breathing in and hold a few seconds  August 27, 2009 ov cough and SOB   resolved, no need for symbicort, back on timolol and doing fine    November 25, 2009 cpx not needing symbicort, no ex symptoms, tia, claudication or symptoms of high or low sugars. no change in rxNovember  9, 2011 ov f/u dm, wants off actos due to fda warnings.  no sob, cp or h/o swelling.  Pt denies any significant sore throat, dysphagia, itching, sneezing,  nasal congestion or excess secretions,  fever, chills, sweats, unintended wt loss, pleuritic or exertional cp, hempoptysis, change in activity tolerance  orthopnea pnd or leg swelling.  no overt hyper or hypoglycemia  Preventive Screening-Counseling & Management  Alcohol-Tobacco     Smoking Status: never  Allergies: 1)  ! Lipitor 2)  ! * Drixoral  Past History:  Past Medical History: ESSENTIAL HYPERTENSION, BENIGN (ICD-401.1) Glaucoma................................................................................[name]/[name] HYPERLIPIDEMIA (ICD-272.4)   -  target LDL less than 70 because of diabetes RHINITIS (ICD-472.0) Intermittent asthma     - ? exac by Timolol OSTEOPENIA (ICD-733.90)    - DEXA  12/28/07 PA Spine .655, L Hip -1.033, Lfem Neck -1.793   -  DEXA  02/23/10              1.7     L Fem -1.9    Right Fem -1.8 OBESITY (ICD-278.00)   - Target wt  =  148  for BMI < 30  AODM (ICD-250.00)   Health Maintenance.........................................................................Marland KitchenWert   - Med calendar done 04/02/08   - Td 11/08   - Pneumovax 5/02   - CPX November 25, 2009    - GYN Health Maint : GYN             Vital Signs:  Patient profile:   77 year old female Height:      59 inches Weight:      163.4 pounds BMI:     33.12 O2 Sat:      97 % on Room air Temp:     97.9 degrees F oral Pulse rate:   67 / minute BP sitting:   134 / 70  (left arm)  Vitals Entered By: Renold Genta RCP, LPN (March 23, 2010 9:36 AM)  O2 Flow:  Room air CC: Follow up, denies sob at this time, needs refill on  symbicort and actos Comments Medications reviewed with patient Renold Genta RCP, LPN  March 23, 2010 9:36 AM    Physical Exam  Additional Exam:  wt  159 November 05, 2008  > 165 August 27, 2009 > 159 November 25, 2009 > 163 March 23, 2010  HEENT: nl dentition, turbinates, and orophanx. Nl external ear canals without cough reflex NECK :  without JVD/Nodes/TM/ nl carotid upstrokes bilaterally LUNGS: no acc muscle,  mild/ moderate sonorous rhonchi exp > isp bilaterally CV:  RRR  no s3 or murmur or increase in P2, no edema   ABD:  soft and nontender with nl excursion in the supine position. No bruits or organomegaly, bowel sounds nl MS:  nl gait, warm without deformities, calf tenderness, cyanosis or clubbing Neuro: alert, approp          Impression & Recommendations:  Problem # 1:  ASTHMA, UNSPECIFIED (ICD-493.90) All goals of asthma met including optimal function and elimination of symptoms with minimum need for rescue therapy. Contingencies discussed today including the rule of two's.   Problem # 2:  AODM (ICD-250.00)  The following medications were removed from the medication list:    Actos 15 Mg Tabs (Pioglitazone hcl) .Marland Kitchen... Take 1 tab by mouth once daily for blood sugar Her updated medication list for this problem includes:    Bayer Aspirin 325 Mg Tabs (Aspirin) ..... Once daily    Metformin Hcl 500 Mg Tabs (Metformin hcl) ..... One tablet twice daily     Labs Reviewed: Creat: 0.5 (11/25/2009)    Reviewed HgBA1c results: 7.2 (08/27/2009) > 7.3 March 23, 2010 > change to metformin  7.0 (05/28/2009)  Medications Added to Medication List This Visit: 1)  Metformin Hcl 500 Mg Tabs (Metformin hcl) .... One tablet twice daily  Other Orders: TLB-BMP (Basic Metabolic Panel-BMET) (80048-METABOL) TLB-A1C / Hgb A1C (Glycohemoglobin) (83036-A1C) Est. Patient Level III ([address])  Patient Instructions: 1)  See [name] w/in 2 weeks with all your medications, even over the counter  meds, separated in two separate bags, the ones you take no matter what vs the ones you stop once you feel better and take only as needed.  She will generate for you a new user friendly medication calendar that will put [place] all on the same page re: your medication use. she will recheck your fasting sugar 2)  stop actos 3)  start metformin 500 mg twice daily Prescriptions: METFORMIN HCL 500 MG  TABS (METFORMIN HCL) One tablet twice daily  #60 x 11   Entered and Authorized by:  Nyoka Cowden MD   Signed by:   Nyoka Cowden MD on 03/23/2010   Method used:   Electronically to        [facility]* (retail)       [address] [place], [place]  [address]       Ph: [phone] or [phone]       Fax: [phone]   RxID:   [id number]

## 2010-06-14 NOTE — Assessment & Plan Note (Signed)
Summary: NP follow up - med calendar   Primary Provider/Referring Provider:  Sherene Sires  CC:  est med calendar - pt brought prescription meds with her.  no new complaints..  History of Present Illness: 43 yowf never smoker with moderate obesity> adult onset diabetes dx  2002.  December 31, 2007  c/o  chronic is bilateral  nocturnal muscle cramps for which quinine works great but she is having more difficulty finding it.  The other complaint she has is persistent nasal congestion with sensation of water in drainage, especially when she lies down at night.  She admits she has not been compliant with Nasarel.--A1C   7.3 advised on diet mananagement.   April 02, 2008 -- follow up and med review. rec increase actos 15 to 2 daily because of hgba1c of 7.2 with pc blood sugar of 176  August 04, 2008 post op visit s/p resection of benign ovarian mass and hysterectomy 07/13/08 and slowly regaining full function, minimal residual pain.    February 02, 2009 ov no med calendar.  no hypoglycemic spells, tia, claudication. rec use med calendar,  no change in rx  March 17, 2009 ov Acute visit.  Pt c/o cough x 3 days.  Cough is prod with yellow sputum.  She also c/o wheezing.  All started 10/29 with scratchy throat and nasal congestion. on timolol eye drops with no h/o asthma.  rec change to betoptic > resolved.  August 06, 2009 Acute visit.  Pt c/o cough and increased SOB x 2 days.  Cough is prod with minimal yellow sputum.  She also c/o "stuffy head" x 3 days and subjective wheezing.  No chronic symptoms of wheeze or sob.  Take prednisone and doxy until complete Symbicort 80 2 puffs first thing  in am and 2 puffs again in pm about 12 hours later if wheezing or breath  Work on inhaler technique:  relax and blow all the way out then take a nice smooth deep breath back in, triggering the inhaler at same time you start breathing in and hold a few seconds  August 27, 2009 ov cough and SOB  resolved, no need for  symbicort, back on timolol and doing fine    November 25, 2009 cpx not needing symbicort, no ex symptoms, tia, claudication or symptoms of high or low sugars. no change in rx March 23, 2010 ov f/u dm, wants off actos due to fda warnings.  no sob, cp or h/o swelling.   >>A1C 7.3 . Actos stopped and metformin two times a day rx   April 13, 2010--Presents for follow up and med review. We orgainzed her meds and updated her med calendar. She is fasting today for cholestrol . Has had muscle cramps in past on statin rx. Cholestrol has not bee at goal in past on zetia but refuses statin. Doing well on metformin w/ no diarrhea. Denies chest pain, dyspnea, orthopnea, hemoptysis, fever, n/v/d, edema, headache.   Medications Prior to Update: 1)  Zetia 10 Mg  Tabs (Ezetimibe) .... Once Daily 2)  Vitamin D 1000 Unit  Caps (Cholecalciferol) .... One Daily 3)  Timolol .... At Bedtime 4)  Travatan 0.004 % Soln (Travoprost) .Marland Kitchen.. 1 Drop Each Eye Once Daily 5)  Centrum Silver   Tabs (Multiple Vitamins-Minerals) .... Once Daily 6)  Bayer Aspirin 325 Mg  Tabs (Aspirin) .... Once Daily 7)  Tums E-X 750 750 Mg Chew (Calcium Carbonate Antacid) .... Before Meals and At Bedtime 8)  Metformin Hcl 500  Mg  Tabs (Metformin Hcl) .... One Tablet Twice Daily 9)  Claritin 10 Mg  Tabs (Loratadine) .... Once Daily As Needed 10)  Delsym 30 Mg/75ml  Lqcr (Dextromethorphan Polistirex) .... 2 Tsp Every 12 Hours As Needed 11)  Advil Cold & Sinus Liqui-Gels 30-200 Mg  Caps (Pseudoephedrine-Ibuprofen) .... As Needed 12)  Advil 200 Mg  Caps (Ibuprofen) .... As Needed 13)  Symbicort 80-4.5 Mcg/act Aero (Budesonide-Formoterol Fumarate) .... 2 Puffs Every 12 Hours As Needed 14)  Desoximetasone 0.05 % Crea (Desoximetasone) .... As Directed  Allergies (verified): 1)  ! Lipitor 2)  ! * Drixoral  Past History:  Past Surgical History: Last updated: 08/04/2008 one ovary removed age 38s TAH USO  July 13, 2008  ............................................Marland KitchenHenley  Family History: Last updated: 05/28/2009 COPD: mother smoker Hyperlipidemia 2 sisters and brother no premature ascvd but pt is oldest Lung  father, lung cancer Prostate Cancer  Social History: Last updated: 11/05/2008 Patient never smoked.  Wine with dinner  Risk Factors: Smoking Status: never (03/23/2010)  Past Medical History: ESSENTIAL HYPERTENSION, BENIGN (ICD-401.1) Glaucoma................................................................................[name]/[name] HYPERLIPIDEMIA (ICD-272.4)   -  target LDL less than 70 because of diabetes RHINITIS (ICD-472.0) Intermittent asthma     - ? exac by Timolol OSTEOPENIA (ICD-733.90)    - DEXA  12/28/07 PA Spine .655, L Hip -1.033, Lfem Neck -1.793   -  DEXA  02/23/10              1.7     L Fem -1.9    Right Fem -1.8 OBESITY (ICD-278.00)   - Target wt  =  148  for BMI < 30  AODM (ICD-250.00)   Health Maintenance.........................................................................Marland KitchenWert   - Med calendar done 04/02/08   - Td 11/08   - Pneumovax 5/02   - CPX November 25, 2009    - GYN Health Maint : GYN COMPLEX MED REGIMEN-Meds reviewed with pt education and computerized med calendar updated April 13, 2010            Review of Systems      See HPI  Vital Signs:  Patient profile:   77 year old female Height:      59 inches Weight:      162.25 pounds BMI:     32.89 O2 Sat:      98 % on Room air Temp:     97.6 degrees F oral Pulse rate:   66 / minute BP sitting:   140 / 78  (left arm) Cuff size:   regular  Vitals Entered By: Boone Master CNA/MA (April 13, 2010 10:26 AM)  O2 Flow:  Room air CC: est med calendar - pt brought prescription meds with her.  no new complaints. Is Patient Diabetic? No Comments Medications reviewed with patient Daytime contact number verified with patient. Boone Master CNA/MA  April 13, 2010 10:22 AM     Physical Exam  Additional Exam:  wt  159 November 05, 2008  > 165 August 27, 2009 > 159 November 25, 2009 > 163 March 23, 2010 >162 April 13, 2010  HEENT: nl dentition, turbinates, and orophanx. Nl external ear canals without cough reflex NECK :  without JVD/Nodes/TM/ nl carotid upstrokes bilaterally LUNGS: no acc muscle,  mild/ moderate sonorous rhonchi exp > isp bilaterally CV:  RRR  no s3 or murmur or increase in P2, no edema   ABD:  soft and nontender with nl excursion in the supine position. No bruits or organomegaly, bowel sounds nl MS:  nl  gait, warm without deformities, calf tenderness, cyanosis or clubbing Neuro: alert, approp   Skin: along right temple hairline 2cm raised dark nevus         Impression & Recommendations:  Problem # 1:  ASTHMA, UNSPECIFIED (ICD-493.90) Controlled   Problem # 2:  HYPERLIPIDEMIA (ICD-272.4)  fasitng labs today if not at goal consider low dose statin w/ q hs every other day   Her updated medication list for this problem includes:    Zetia 10 Mg Tabs (Ezetimibe) ..... Once daily  Labs Reviewed: SGOT: 17 (11/25/2009)   SGPT: 17 (11/25/2009)   HDL:72.60 (11/25/2009), 57.80 (08/27/2009)  LDL:114 (05/28/2009), 111 (12/31/2007)  Chol:305 (11/25/2009), 276 (08/27/2009)  Trig:316.0 (11/25/2009), 288.0 (08/27/2009)  Orders: TLB-Hepatic/Liver Function Pnl (80076-HEPATIC) TLB-BMP (Basic Metabolic Panel-BMET) (80048-METABOL) TLB-Lipid Panel (80061-LIPID) TLB-CRP-High Sensitivity (C-Reactive Protein) (86140-FCRP) Est. Patient Level IV (56213)  Problem # 3:  AODM (ICD-250.00)  A1c 7.3  advised on diet and exercise.  follow up labs in 3 months w/ A1C.  Her updated medication list for this problem includes:    Bayer Aspirin 325 Mg Tabs (Aspirin) ..... Once daily    Metformin Hcl 500 Mg Tabs (Metformin hcl) ..... One tablet twice daily  Her updated medication list for this problem includes:    Bayer Aspirin 325 Mg Tabs (Aspirin) ..... Once  daily    Metformin Hcl 500 Mg Tabs (Metformin hcl) ..... One tablet twice daily  Orders: Est. Patient Level IV (08657)  Problem # 4:  NEVUS (ICD-216.9)  facial nevus w/ irregular borders/raised have recommended referral to dermatologist she declines, says she will make her own appointment with Dr. Londell Moh (husband's derm) advised of dangers of moles/skin cancers.   she verbalizes understanding. husband encouraged her as well she declines referral today.   Orders: Est. Patient Level IV (84696)  Complete Medication List: 1)  Zetia 10 Mg Tabs (Ezetimibe) .... Once daily 2)  Vitamin D 1000 Unit Caps (Cholecalciferol) .... One daily 3)  Timolol  .... At bedtime 4)  Travatan 0.004 % Soln (Travoprost) .Marland Kitchen.. 1 drop each eye once daily 5)  Centrum Silver Tabs (Multiple vitamins-minerals) .... Once daily 6)  Bayer Aspirin 325 Mg Tabs (Aspirin) .... Once daily 7)  Tums E-x 750 750 Mg Chew (Calcium carbonate antacid) .... Before meals and at bedtime 8)  Metformin Hcl 500 Mg Tabs (Metformin hcl) .... One tablet twice daily 9)  Claritin 10 Mg Tabs (Loratadine) .... Once daily as needed 10)  Delsym 30 Mg/18ml Lqcr (Dextromethorphan polistirex) .... 2 tsp every 12 hours as needed 11)  Advil Cold & Sinus Liqui-gels 30-200 Mg Caps (Pseudoephedrine-ibuprofen) .... As needed 12)  Advil 200 Mg Caps (Ibuprofen) .... As needed 13)  Symbicort 80-4.5 Mcg/act Aero (Budesonide-formoterol fumarate) .... 2 puffs every 12 hours as needed 14)  Desoximetasone 0.05 % Crea (Desoximetasone) .... As directed  Patient Instructions: 1)  I will call with lab resuls.  2)  Follow med calendar closely and birng to each visit.  3)  follow up 3 months Dr. Sherene Sires  4)  Low sweet and cholestrol diet.  5)  Please contact office for sooner follow up if symptoms do not improve or worsen  6)  PLEASE MAKE APPOINTMENT TO SEE DR HOUSTON ABOUT MOLES, ESPECIALLY THE ONE ON YOUR FACE/HAIRLINE.     Appended Document: med calendar  update    Clinical Lists Changes  Medications: Changed medication from ZETIA 10 MG  TABS (EZETIMIBE) once daily to ZETIA 10 MG  TABS (EZETIMIBE) Take 1  tab by mouth at bedtime Changed medication from * TIMOLOL at bedtime to TIMOLOL MALEATE 0.5 % SOLG (TIMOLOL MALEATE) 1 drop each eye at bedtime Changed medication from TRAVATAN 0.004 % SOLN (TRAVOPROST) 1 drop each eye once daily to TRAVATAN 0.004 % SOLN (TRAVOPROST) 1 drop each eye at bedtime Changed medication from TUMS E-X 750 750 MG CHEW (CALCIUM CARBONATE ANTACID) before meals and at bedtime to TUMS E-X 750 750 MG CHEW (CALCIUM CARBONATE ANTACID) 1 tab with each meal Added new medication of AFRIN NASAL SPRAY 0.05 % SOLN (OXYMETAZOLINE HCL) 1 spray every 12 hours x5 days Changed medication from ADVIL COLD & SINUS LIQUI-GELS 30-200 MG  CAPS (PSEUDOEPHEDRINE-IBUPROFEN) as needed to ADVIL COLD & SINUS LIQUI-GELS 30-200 MG  CAPS (PSEUDOEPHEDRINE-IBUPROFEN) per bottle as needed Changed medication from ADVIL 200 MG  CAPS (IBUPROFEN) as needed to ADVIL 200 MG  CAPS (IBUPROFEN) 2-3 with meals two times a day as needed Removed medication of DESOXIMETASONE 0.05 % CREA (DESOXIMETASONE) as directed Added new medication of GINKOBA 40 MG TABS (GINKGO BILOBA) use as needed

## 2010-06-14 NOTE — Assessment & Plan Note (Signed)
Summary: Primary Svc/ multiple issues   Primary Provider/Referring Provider:  Sherene Sires  CC:  Followup for fasting labs.  Pt denies any complaints today.Mikayla [name]  History of Present Illness: 13 yowf never smoker with moderate obesity> adult onset diabetes dx  2002    December 31, 2007  c/o  chronic is bilateral  nocturnal muscle cramps for which quinine works great but she is having more difficulty finding it.  The other complaint she has is persistent nasal congestion with sensation of water in drainage, especially when she lies down at night.  She admits she has not been compliant with Nasarel.--A1C   7.3 advised on diet mananagement.   April 02, 2008 -- follow up and med review. rec increase actos 15 to 2 daily because of hgba1c of 7.2 with pc blood sugar of 176  August 04, 2008 post op visit s/p resection of benign ovarian mass and hysterectomy 07/13/08 and slowly regaining full function, minimal residual pain.    February 02, 2009 ov no med calendar.  no hypoglycemic spells, tia, claudication. rec use med calendar,  no change in rx  March 17, 2009 ov Acute visit.  Pt c/o cough x 3 days.  Cough is prod with yellow sputum.  She also c/o wheezing.  All started 10/29 with scratchy throat and nasal congestion. on timolol eye drops with no h/o asthma.    May 28, 2009 Followup for fasting labs.  Pt denies any complaints today. no overt symptoms of hypo or hyperglycemia.  Pt denies any significant sore throat, dysphagia, itching, sneezing,  nasal congestion or excess secretions,  fever, chills, sweats, unintended wt loss, pleuritic or exertional cp, hempoptysis, change in activity tolerance  orthopnea pnd or leg swelling   Current Medications (verified): 1)  Actos 15 Mg  Tabs (Pioglitazone Hcl) .... Take 1 Tab By Mouth Once Daily For Blood Sugar 2)  Zetia 10 Mg  Tabs (Ezetimibe) .... Once Daily 3)  Vitamin D 1000 Unit  Caps (Cholecalciferol) .... One Daily 4)  Timolol .... At Bedtime 5)  Travatan  0.004 % Soln (Travoprost) .Mikayla [name].. 1 Drop Each Eye Once Daily 6)  Centrum Silver   Tabs (Multiple Vitamins-Minerals) .... Once Daily 7)  Bayer Aspirin 325 Mg  Tabs (Aspirin) .... Once Daily 8)  Tums 500 Mg  Chew (Calcium Carbonate Antacid) .... Before Meals and At Bedtime 9)  Claritin 10 Mg  Tabs (Loratadine) .... Once Daily As Needed 10)  Delsym 30 Mg/56ml  Lqcr (Dextromethorphan Polistirex) .... 2 Tsp Every 12 Hours As Needed 11)  Advil Cold & Sinus Liqui-Gels 30-200 Mg  Caps (Pseudoephedrine-Ibuprofen) .... As Needed 12)  Advil 200 Mg  Caps (Ibuprofen) .... As Needed 13)  Desoximetasone 0.05 % Crea (Desoximetasone) .... As Directed  Allergies (verified): 1)  ! Lipitor 2)  ! * Drixoral  Past History:  Past Medical History: ESSENTIAL HYPERTENSION, BENIGN (ICD-401.1) Glaucoma................................................................................Mikayla KitchenCashwell HYPERLIPIDEMIA (ICD-272.4)   -  target LDL less than 70 because of diabetes RHINITIS (ICD-472.0) OSTEOPENIA (ICD-733.90)    - DEXA  12/28/07 PA Spine .655, L Hip -1.033, Lfem Neck -1.793 OBESITY (ICD-278.00)   - Target wt  =  148  for BMI < 30  AODM (ICD-250.00)   Health Maintenance.....................................................................Mikayla KitchenWert   - Med calendar done 04/02/08   - Td 11/08   - Pneumovax 5/02   - November 05, 2008 CPX             Family History: COPD: mother smoker Hyperlipidemia 2 sisters and brother no premature  ascvd but pt is oldest Lung  father, lung cancer Prostate Cancer  Vital Signs:  Patient profile:   77 year old female Weight:      161.38 pounds BMI:     31.63 O2 Sat:      99 % on Room air Temp:     97.8 degrees F oral Pulse rate:   64 / minute BP sitting:   136 / 78  (left arm)  Vitals Entered By: Vernie Murders (May 28, 2009 9:29 AM)  O2 Flow:  Room air  Physical Exam  Additional Exam:  wt 157 > 159 November 05, 2008 > 162 February 02, 2009 > 161 March 17, 2009 >  161 May 29, 2009  HEENT: nl dentition, turbinates, and orophanx. Nl external ear canals without cough reflex NECK :  without JVD/Nodes/TM/ nl carotid upstrokes bilaterally LUNGS: no acc muscle,  mild/ moderate sonorous rhonchi exp > isp bilaterally CV:  RRR  no s3 or murmur or increase in P2, no edema   ABD:  soft and nontender with nl excursion in the supine position. No bruits or organomegaly, bowel sounds nl MS:  warm without deformities, calf tenderness, cyanosis or clubbing SKIN: warm and dry without lesions      Sodium                    142 mEq/L                   135-145   Potassium                 4.9 mEq/L                   3.5-5.3   Chloride                  102 mEq/L                   96-112   CO2                  [L]  18 mEq/L                    19-32   Glucose              [H]  118 mg/dL                   16-10   BUN                       17 mg/dL                    9-60   Creatinine                0.58 mg/dL                  0.40-1.20   Calcium                   10.0 mg/dL                  4.5-40.9  Tests: (2) Lipid Profile (81191)   Cholesterol          [H]  226 mg/dL                   4-782     ATP III Classification:           <  200        mg/dL        Desirable          200 - 239     mg/dL        Borderline High          >= 240        mg/dL        High         Triglyceride         [H]  273 mg/dL                   <098   HDL Cholesterol           57 mg/dL                    >11   Total Chol/HDL Ratio      4.0 Ratio  VLDL Cholesterol (Calc)                        [H]  55 mg/dL                    9-14  LDL Cholesterol (Calc)                        [H]  114 mg/dL                   7-82           Total Cholesterol/HDL Ratio:CHD Risk                            Coronary Heart Disease Risk Table                                            Men       Women              1/2 Average Risk              3.4        3.3                  Average Risk              5.0         4.4              2 X Average Risk              9.6        7.1              3 X Average Risk             23.4       11.0     Use the calculated Patient Ratio above and the CHD Risk table      to determine the patient's CHD Risk.     ATP III Classification (LDL):           < 100        mg/dL         Optimal          100 - 129     mg/dL  Near or Above Optimal          130 - 159     mg/dL         Borderline High          160 - 189     mg/dL         High           > 190        mg/dL         Very High        Tests: (3) Liver Profile (16109)   Bilirubin, Total          0.5 mg/dL                   6.0-4.5   Bilirubin, Direct         0.1 mg/dL                   4.0-9.8   Indirect Bilirubin        0.4 mg/dL                   1.1-9.1   Alkaline Phosphatase      43 U/L                      39-117   AST/SGOT                  18 U/L                      0-37   ALT/SGPT                  12 U/L                      0-35   Total Protein             7.3 g/dL                    4.7-8.2   Albumin                   4.2 g/dL                    9.5-6.2  Hgb A1C  7.0  Impression & Recommendations:  Problem # 1:  AODM (ICD-250.00)  Her updated medication list for this problem includes:    Actos 15 Mg Tabs (Pioglitazone hcl) .Mikayla [name]... Take 1 tab by mouth once daily for blood sugar    Bayer Aspirin 325 Mg Tabs (Aspirin) ..... Once daily   Labs Reviewed: Creat: 0.5 (02/02/2009)    Reviewed HgBA1c results: 6.7 (02/02/2009) >  7.0  May 28, 2009  so no change med, work on diet ex  6.8 (11/05/2008)  Problem # 2:  HYPERLIPIDEMIA (ICD-272.4)  can't tolerate statins, target ldl , 70 due to dm.  Her updated medication list for this problem includes:    Zetia 10 Mg Tabs (Ezetimibe) ..... Once daily  Labs Reviewed: SGOT: 16 (02/02/2009)   SGPT: 16 (02/02/2009)   HDL:52.40 (02/02/2009), 63.20 (11/05/2008)  LDL:111 (12/31/2007) > 114 May 28, 2009  so no change feasible  DEL (09/12/2007)  Chol:245  (02/02/2009), 225 (11/05/2008)  Trig:226.0 (02/02/2009), 262.0 (11/05/2008)  Orders: Est. Patient Level IV (13086)  Problem # 3:  OBESITY (ICD-278.00)  Weight control is a matter of calorie balance which needs to be  tilted in the pt's favor by eating less and exercising more.  Specifically, I recommended  exercise at a level where pt  is short of breath but not out of breath 30 minutes daily.  If not losing weight on this program, I would strongly recommend pt see a nutritionist with a food diary recorded for two weeks prior to the visit.     Problem # 4:  OSTEOPENIA (ICD-733.90) vit d 44 on present rx, no changes required. increase wt bearing ex advised   Each maintenance medication was reviewed in detail including most importantly the difference between maintenance and as needed and under what circumstances the prns are to be used.  This was done in the context of a medication calendar review which provided the patient with a user-friendly unambiguous mechanism for medication administration and reconciliation and provides an action plan for all active problems. It is critical that this be shown to every doctor  for modification during the office visit if necessary so the patient can use it as a working document.   Medications Added to Medication List This Visit: 1)  Travatan 0.004 % Soln (Travoprost) .Mikayla [name].. 1 drop each eye once daily  Other Orders: T-Vitamin D (25-Hydroxy) (16109-60454) T- * Misc. Laboratory test [phone]) TLB-A1C / Hgb A1C (Glycohemoglobin) (83036-A1C)  Patient Instructions: 1)  Weight control is simply a matter of calorie balance which needs to be tilted in your favor by eating less and exercising more.  To get the most out of exercise, you need to be continuously aware that you are short of breath, but never out of breath, for 30 minutes daily. As you improve, it will actually be easier for you to do the same amount in  30 minutes so always push to the level where you are  short of breath.  If this does not result in gradual weight reduction,  I recommend  a nutritionist for a food diary 2)  We will call with lab reports 3)  See calendar for specific medication instructions and bring it back for each and every office visit for every healthcare provider you see.  Without it,  you may not receive the best quality medical care that we feel you deserve. 4)  Return to office in 3 months, sooner if needed

## 2010-06-14 NOTE — Assessment & Plan Note (Signed)
Summary: Primary svc/ f/u ov multiple chronic problems   Primary Provider/Referring Provider:  Sherene Sires  CC:  3 month followup.  Pt states that her cough and SOB has resolved.  She denies any complaints today.Marland Kitchen  History of Present Illness: 50 yowf never smoker with moderate obesity> adult onset diabetes dx  2002    December 31, 2007  c/o  chronic is bilateral  nocturnal muscle cramps for which quinine works great but she is having more difficulty finding it.  The other complaint she has is persistent nasal congestion with sensation of water in drainage, especially when she lies down at night.  She admits she has not been compliant with Nasarel.--A1C   7.3 advised on diet mananagement.   April 02, 2008 -- follow up and med review. rec increase actos 15 to 2 daily because of hgba1c of 7.2 with pc blood sugar of 176  August 04, 2008 post op visit s/p resection of benign ovarian mass and hysterectomy 07/13/08 and slowly regaining full function, minimal residual pain.    February 02, 2009 ov no med calendar.  no hypoglycemic spells, tia, claudication. rec use med calendar,  no change in rx  March 17, 2009 ov Acute visit.  Pt c/o cough x 3 days.  Cough is prod with yellow sputum.  She also c/o wheezing.  All started 10/29 with scratchy throat and nasal congestion. on timolol eye drops with no h/o asthma.  rec change to betoptic > resolved.  August 06, 2009 Acute visit.  Pt c/o cough and increased SOB x 2 days.  Cough is prod with minimal yellow sputum.  She also c/o "stuffy head" x 3 days and subjective wheezing.  No chronic symptoms of wheeze or sob.  Take prednisone and doxy until complete Symbicort 80 2 puffs first thing  in am and 2 puffs again in pm about 12 hours later if wheezing or breath  Work on inhaler technique:  relax and blow all the way out then take a nice smooth deep breath back in, triggering the inhaler at same time you start breathing in and hold a few seconds  August 27, 2009 ov  cough and SOB has resolved.  She denies any complaints of hypo or hyperglycemia cough or sob.  Pt denies any significant sore throat, dysphagia, itching, sneezing,  nasal congestion or excess secretions,  fever, chills, sweats, unintended wt loss, pleuritic or exertional cp, hempoptysis.  Current Medications (verified): 1)  Actos 15 Mg  Tabs (Pioglitazone Hcl) .... Take 1 Tab By Mouth Once Daily For Blood Sugar 2)  Zetia 10 Mg  Tabs (Ezetimibe) .... Once Daily 3)  Vitamin D 1000 Unit  Caps (Cholecalciferol) .... One Daily 4)  Timolol .... At Bedtime 5)  Travatan 0.004 % Soln (Travoprost) .Marland Kitchen.. 1 Drop Each Eye Once Daily 6)  Centrum Silver   Tabs (Multiple Vitamins-Minerals) .... Once Daily 7)  Bayer Aspirin 325 Mg  Tabs (Aspirin) .... Once Daily 8)  Tums 500 Mg  Chew (Calcium Carbonate Antacid) .... Before Meals and At Bedtime 9)  Claritin 10 Mg  Tabs (Loratadine) .... Once Daily As Needed 10)  Delsym 30 Mg/20ml  Lqcr (Dextromethorphan Polistirex) .... 2 Tsp Every 12 Hours As Needed 11)  Advil Cold & Sinus Liqui-Gels 30-200 Mg  Caps (Pseudoephedrine-Ibuprofen) .... As Needed 12)  Advil 200 Mg  Caps (Ibuprofen) .... As Needed  Allergies (verified): 1)  ! Lipitor 2)  ! * Drixoral  Past History:  Past Medical History: ESSENTIAL HYPERTENSION,  BENIGN (ICD-401.1) Glaucoma................................................................................[name] HYPERLIPIDEMIA (ICD-272.4)   -  target LDL less than 70 because of diabetes RHINITIS (ICD-472.0) Intermittent asthma     -  OSTEOPENIA (ICD-733.90)    - DEXA  12/28/07 PA Spine .655, L Hip -1.033, Lfem Neck -1.793 OBESITY (ICD-278.00)   - Target wt  =  148  for BMI < 30  AODM (ICD-250.00)   Health Maintenance.......................................................................Marland KitchenWert   - Med calendar done 04/02/08   - Td 11/08   - Pneumovax 5/02   - November 05, 2008 CPX             Vital Signs:  Patient profile:   77 year  old female Weight:      165 pounds O2 Sat:      98 % on Room air Temp:     97.9 degrees F oral Pulse rate:   60 / minute BP sitting:   140 / 76  (left arm) Cuff size:   large  Vitals Entered By: Vernie Murders (August 27, 2009 9:22 AM)  O2 Flow:  Room air  Physical Exam  Additional Exam:  wt  159 November 05, 2008 > 162 February 02, 2009 >  161 May 29, 2009 > 165 August 06, 2009 > 165 August 27, 2009  HEENT: nl dentition, turbinates, and orophanx. Nl external ear canals without cough reflex NECK :  without JVD/Nodes/TM/ nl carotid upstrokes bilaterally LUNGS: no acc muscle,  mild/ moderate sonorous rhonchi exp > isp bilaterally CV:  RRR  no s3 or murmur or increase in P2, no edema   ABD:  soft and nontender with nl excursion in the supine position. No bruits or organomegaly, bowel sounds nl MS:  warm without deformities, calf tenderness, cyanosis or clubbing        Sodium                    140 mEq/L                   135-145   Potassium                 4.3 mEq/L                   3.5-5.1   Chloride                  102 mEq/L                   96-112   Carbon Dioxide            30 mEq/L                    19-32   Glucose              [H]  127 mg/dL                   40-98   BUN                       12 mg/dL                    1-19   Creatinine                0.5 mg/dL                   1.4-7.8   Calcium  9.2 mg/dL                   0.4-54.0   GFR                       125.51 mL/min               >60  Tests: (2) Hepatic/Liver Function Panel (HEPATIC)   Total Bilirubin           0.6 mg/dL                   9.8-1.1   Direct Bilirubin          0.1 mg/dL                   9.1-4.7   Alkaline Phosphatase [L]  36 U/L                      39-117   AST                       17 U/L                      0-37   ALT                       17 U/L                      0-35   Total Protein             6.8 g/dL                    8.2-9.5   Albumin                   3.7 g/dL                     6.2-1.3  Tests: (3) Lipid Panel (LIPID)   Cholesterol          [H]  276 mg/dL                   0-865     ATP III Classification            Desirable:  < 200 mg/dL                    Borderline High:  200 - 239 mg/dL               High:  > = 240 mg/dL   Triglycerides        [H]  288.0 mg/dL                 7.8-469.6     Normal:  <150 mg/dL     Borderline High:  295 - 199 mg/dL   HDL                       28.41 mg/dL                 >32.44   VLDL Cholesterol     [H]  57.6 mg/dL                  0.1-02.7  CHO/HDL Ratio:  CHD Risk  5                    Men          Women     1/2 Average Risk     3.4          3.3     Average Risk          5.0          4.4     2X Average Risk          9.6          7.1     3X Average Risk          15.0          11.0                           Tests: (4) Hemoglobin A1C (A1C)   Hemoglobin A1C       [H]  7.2 %                       4.6-6.5     Glycemic Control Guidelines for People with Diabetes:     Non Diabetic:  <6%     Goal of Therapy: <7%     Additional Action Suggested:  >8%   Tests: (5) Cholesterol LDL - Direct (DIRLDL)  Cholesterol LDL - Direct                             154.2 mg/dL  Impression & Recommendations:  Problem # 1:  ASTHMA, UNSPECIFIED (ICD-493.90) All goals of asthma met including optimal function and elimination of symptoms with minimum need for rescue therapy. Contingencies discussed today including the rule of two's.    Each maintenance medication was reviewed in detail including most importantly the difference between maintenance and as needed and under what circumstances the prns are to be used.  This was done in the context of a medication calendar review which provided the patient with a user-friendly unambiguous mechanism for medication administration and reconciliation and provides an action plan for all active problems. It is critical that this be shown to every doctor  for  modification during the office visit if necessary so the patient can use it as a working document.   Problem # 2:  AODM (ICD-250.00)  Her updated medication list for this problem includes:    Actos 15 Mg Tabs (Pioglitazone hcl) .Marland Kitchen... Take 1 tab by mouth once daily for blood sugar    Bayer Aspirin 325 Mg Tabs (Aspirin) ..... Once daily  Labs Reviewed: Creat: 0.58 (05/28/2009)    Reviewed HgBA1c results: 7.0 (05/28/2009) > 7.2 August 27, 2009   6.7 (02/02/2009)  Problem # 3:  HYPERLIPIDEMIA (ICD-272.4)  Her updated medication list for this problem includes:    Zetia 10 Mg Tabs (Ezetimibe) ..... Once daily   Labs Reviewed: SGOT: 18 (05/28/2009)   SGPT: 12 (05/28/2009)   HDL:57 (05/28/2009), 52.40 (02/02/2009)  LDL:114 (05/28/2009), 111 (12/31/2007)   >  154  August 27, 2009 work on diet/ex   Chol:226 (05/28/2009), 245 (02/02/2009)  Trig:273 (05/28/2009), 226.0 (02/02/2009)  Problem # 4:  ESSENTIAL HYPERTENSION, BENIGN (ICD-401.1) control with diet  Other Orders: TLB-BMP (Basic Metabolic Panel-BMET) (80048-METABOL) TLB-Hepatic/Liver Function Pnl (80076-HEPATIC) TLB-Lipid Panel (80061-LIPID) TLB-A1C / Hgb A1C (Glycohemoglobin) (83036-A1C) Est. Patient Level IV (16109)  Patient Instructions:  1)  See calendar for specific medication instructions and bring it back for each and every office visit for every healthcare provider you see.  Without it,  you may not receive the best quality medical care that we feel you deserve.  2)  CPX 3 months

## 2010-06-14 NOTE — Assessment & Plan Note (Signed)
Summary: Primary svc/ cpx   Primary Provider/Referring Provider:  Sherene Sires   History of Present Illness: 77 yowf never smoker with moderate obesity> adult onset diabetes dx  2002.  December 31, 2007  c/o  chronic is bilateral  nocturnal muscle cramps for which quinine works great but she is having more difficulty finding it.  The other complaint she has is persistent nasal congestion with sensation of water in drainage, especially when she lies down at night.  She admits she has not been compliant with Nasarel.--A1C   7.3 advised on diet mananagement.   April 02, 2008 -- follow up and med review. rec increase actos 15 to 2 daily because of hgba1c of 7.2 with pc blood sugar of 176  August 04, 2008 post op visit s/p resection of benign ovarian mass and hysterectomy 07/13/08 and slowly regaining full function, minimal residual pain.    February 02, 2009 ov no med calendar.  no hypoglycemic spells, tia, claudication. rec use med calendar,  no change in rx  March 17, 2009 ov Acute visit.  Pt c/o cough x 3 days.  Cough is prod with yellow sputum.  She also c/o wheezing.  All started 10/29 with scratchy throat and nasal congestion. on timolol eye drops with no h/o asthma.  rec change to betoptic > resolved.  August 06, 2009 Acute visit.  Pt c/o cough and increased SOB x 2 days.  Cough is prod with minimal yellow sputum.  She also c/o "stuffy head" x 3 days and subjective wheezing.  No chronic symptoms of wheeze or sob.  Take prednisone and doxy until complete Symbicort 80 2 puffs first thing  in am and 2 puffs again in pm about 12 hours later if wheezing or breath  Work on inhaler technique:  relax and blow all the way out then take a nice smooth deep breath back in, triggering the inhaler at same time you start breathing in and hold a few seconds  August 27, 2009 ov cough and SOB  resolved, no need for symbicort, back on timolol and doing fine    November 25, 2009 cpx not needing symbicort, no ex  symptoms, tia, claudication or symptoms of high or low sugars.  Current Medications (verified): 1)  Actos 15 Mg  Tabs (Pioglitazone Hcl) .... Take 1 Tab By Mouth Once Daily For Blood Sugar 2)  Zetia 10 Mg  Tabs (Ezetimibe) .... Once Daily 3)  Vitamin D 1000 Unit  Caps (Cholecalciferol) .... One Daily 4)  Timolol .... At Bedtime 5)  Travatan 0.004 % Soln (Travoprost) .Marland Kitchen.. 1 Drop Each Eye Once Daily 6)  Centrum Silver   Tabs (Multiple Vitamins-Minerals) .... Once Daily 7)  Bayer Aspirin 325 Mg  Tabs (Aspirin) .... Once Daily 8)  Tums E-X 750 750 Mg Chew (Calcium Carbonate Antacid) .... Before Meals and At Bedtime 9)  Claritin 10 Mg  Tabs (Loratadine) .... Once Daily As Needed 10)  Delsym 30 Mg/31ml  Lqcr (Dextromethorphan Polistirex) .... 2 Tsp Every 12 Hours As Needed 11)  Advil Cold & Sinus Liqui-Gels 30-200 Mg  Caps (Pseudoephedrine-Ibuprofen) .... As Needed 12)  Advil 200 Mg  Caps (Ibuprofen) .... As Needed 13)  Symbicort 80-4.5 Mcg/act Aero (Budesonide-Formoterol Fumarate) .... 2 Puffs Every 12 Hours As Needed  Allergies (verified): 1)  ! Lipitor 2)  ! * Drixoral  Past History:  Past Medical History: ESSENTIAL HYPERTENSION, BENIGN (ICD-401.1) Glaucoma................................................................................[name]/[name] HYPERLIPIDEMIA (ICD-272.4)   -  target LDL less than 70 because of diabetes RHINITIS (  ICD-472.0) Intermittent asthma     - ? exac by Timolol OSTEOPENIA (ICD-733.90)    - DEXA  12/28/07 PA Spine .655, L Hip -1.033, Lfem Neck -1.793 OBESITY (ICD-278.00)   - Target wt  =  148  for BMI < 30  AODM (ICD-250.00)   Health Maintenance.......................................................................Marland KitchenWert   - Med calendar done 04/02/08   - Td 11/08   - Pneumovax 5/02   - CPX November 25, 2009    - GYN Health Maint : GYN             Family History: Reviewed history from 05/28/2009 and no changes required. COPD: mother  smoker Hyperlipidemia 2 sisters and brother no premature ascvd but pt is oldest Lung  father, lung cancer Prostate Cancer  Social History: Reviewed history from 11/05/2008 and no changes required. Patient never smoked.  Wine with dinner  Vital Signs:  Patient profile:   77 year old female Height:      59 inches Weight:      159.50 pounds BMI:     32.33 O2 Sat:      97 % on Room air Temp:     97.7 degrees F oral Pulse rate:   67 / minute BP sitting:   112 / 72  (left arm) Cuff size:   large  Vitals Entered ByVernie Murders (November 25, 2009 8:45 AM)  O2 Flow:  Room air  Physical Exam  Additional Exam:  wt  159 November 05, 2008 > 162 February 02, 2009 >  161 May 29, 2009 > 165 August 06, 2009 > 165 August 27, 2009 > 159 November 25, 2009  HEENT: nl dentition, turbinates, and orophanx. Nl external ear canals without cough reflex NECK :  without JVD/Nodes/TM/ nl carotid upstrokes bilaterally LUNGS: no acc muscle,  mild/ moderate sonorous rhonchi exp > isp bilaterally CV:  RRR  no s3 or murmur or increase in P2, no edema   ABD:  soft and nontender with nl excursion in the supine position. No bruits or organomegaly, bowel sounds nl MS:  nl gait, warm without deformities, calf tenderness, cyanosis or clubbing Neuro: alert, approp with nl short term recall, no sensory or motor def, nl dtrs      Cholesterol          [H]  305 mg/dL                   1-610     ATP III Classification            Desirable:  < 200 mg/dL                    Borderline High:  200 - 239 mg/dL               High:  > = 240 mg/dL   Triglycerides        [H]  316.0 mg/dL                 9.6-045.4     Normal:  <150 mg/dL     Borderline High:  098 - 199 mg/dL   HDL                       11.91 mg/dL                 >47.82   VLDL Cholesterol     [H]  63.2 mg/dL  0.0-40.0  CHO/HDL Ratio:  CHD Risk                             4                    Men          Women     1/2 Average Risk     3.4           3.3     Average Risk          5.0          4.4     2X Average Risk          9.6          7.1     3X Average Risk          15.0          11.0                           Tests: (2) BMP (METABOL)   Sodium                    139 mEq/L                   135-145   Potassium                 4.4 mEq/L                   3.5-5.1   Chloride                  100 mEq/L                   96-112   Carbon Dioxide            30 mEq/L                    19-32   Glucose              [H]  124 mg/dL                   16-10   BUN                       20 mg/dL                    9-60   Creatinine                0.5 mg/dL                   4.5-4.0   Calcium                   9.6 mg/dL                   9.8-11.9   GFR                       131.49 mL/min               >60  Tests: (3) CBC Platelet w/Diff (CBCD)   White Cell Count          7.9 K/uL  4.5-10.5   Red Cell Count            4.23 Mil/uL                 3.87-5.11   Hemoglobin                13.2 g/dL                   60.4-54.0   Hematocrit                37.8 %                      36.0-46.0   MCV                       89.4 fl                     78.0-100.0   MCHC                      34.8 g/dL                   98.1-19.1   RDW                       13.6 %                      11.5-14.6   Platelet Count            206.0 K/uL                  150.0-400.0   Neutrophil %              66.0 %                      43.0-77.0   Lymphocyte %              22.8 %                      12.0-46.0   Monocyte %                7.3 %                       3.0-12.0   Eosinophils%              3.7 %                       0.0-5.0   Basophils %               0.2 %                       0.0-3.0   Neutrophill Absolute      5.2 K/uL                    1.4-7.7   Lymphocyte Absolute       1.8 K/uL                    0.7-4.0   Monocyte Absolute         0.6 K/uL  0.1-1.0  Eosinophils, Absolute                             0.3 K/uL                     0.0-0.7   Basophils Absolute        0.0 K/uL                    0.0-0.1  Tests: (4) Hepatic/Liver Function Panel (HEPATIC)   Total Bilirubin           0.7 mg/dL                   5.4-0.9   Direct Bilirubin          0.1 mg/dL                   8.1-1.9   Alkaline Phosphatase      40 U/L                      39-117   AST                       17 U/L                      0-37   ALT                       17 U/L                      0-35   Total Protein             7.3 g/dL                    1.4-7.8   Albumin                   4.3 g/dL                    2.9-5.6  Tests: (5) TSH (TSH)   FastTSH                   3.27 uIU/mL                 0.35-5.50  Tests: (6) Microalbumin/Creatinine Ratio (MALB)   Microalbumin              0.6 mg/dL                   2.1-3.0   Urine Creainine           64.2 mg/dL   Microalbumin Ratio        0.9 mg/g                    0.0-30.0  Tests: (7) UDip w/Micro (URINE)   Color                     LT. YELLOW       RANGE:  Yellow;Lt. Yellow   Clarity                   CLEAR                       Clear  Specific Gravity          1.015                       1.000 - 1.030   Urine Ph                  6.5                         5.0-8.0   Protein                   NEGATIVE                    Negative   Urine Glucose             NEGATIVE                    Negative   Ketones                   NEGATIVE                    Negative   Urine Bilirubin           NEGATIVE                    Negative   Blood                     NEGATIVE                    Negative   Urobilinogen              0.2                         0.0 - 1.0   Leukocyte Esterace        SMALL                       Negative   Nitrite                   NEGATIVE                    Negative   Urine WBC                 3-6/hpf                     0-2/hpf   Urine Epith               Rare(0-4/hpf)               Rare(0-4/hpf)   Urine Bacteria            Rare(<10/hpf)               None  Tests: (8)  Cholesterol LDL - Direct (DIRLDL)  Cholesterol LDL - Direct                             178.3 mg/dL  Impression & Recommendations:  Problem # 1:  AODM (ICD-250.00)    Labs Reviewed: Creat: 0.5 (08/27/2009)    Reviewed HgBA1c results: 7.2 (08/27/2009)   7.0 (05/28/2009)  ok on rx acceptable fbs, rech hgba1c 3 months  Problem #  2:  OSTEOPENIA (ICD-733.90)  VIt k ok, repeat bone density due after 01/27/10  Orders: T-Vitamin D (25-Hydroxy) [phone]) Misc. Referral (Misc. Ref)  Problem # 3:  HYPERLIPIDEMIA (ICD-272.4)  Her updated medication list for this problem includes:    Zetia 10 Mg Tabs (Ezetimibe) ..... Once daily     Labs Reviewed: SGOT: 17 (08/27/2009)   SGPT: 17 (08/27/2009)   HDL:57.80 (08/27/2009), 57 (05/28/2009)  LDL:114 (05/28/2009), 111 (12/31/2007)  >  178 November 25, 2009  Chol:276 (08/27/2009), 226 (05/28/2009)  Trig:288.0 (08/27/2009), 273 (05/28/2009)  Can't take statins so options are diet /ex or go to lipid clinic  Problem # 4:  OBESITY (ICD-278.00)  Weight control is a matter of calorie balance which needs to be tilted in the pt's favor by eating less and exercising more.  Specifically, I recommended  exercise at a level where pt  is short of breath but not out of breath 30 minutes daily.  If not losing weight on this program, I would strongly recommend pt see a nutritionist with a food diary recorded for two weeks prior to the visit.     Medications Added to Medication List This Visit: 1)  Tums E-x 750 750 Mg Chew (Calcium carbonate antacid) .... Before meals and at bedtime 2)  Symbicort 80-4.5 Mcg/act Aero (Budesonide-formoterol fumarate) .... 2 puffs every 12 hours as needed  Other Orders: Est. Patient 65& > (44010) TLB-Lipid Panel (80061-LIPID) TLB-BMP (Basic Metabolic Panel-BMET) (80048-METABOL) TLB-CBC Platelet - w/Differential (85025-CBCD) TLB-Hepatic/Liver Function Pnl (80076-HEPATIC) TLB-TSH (Thyroid Stimulating Hormone)  (84443-TSH) TLB-Udip ONLY (81003-UDIP) TLB-Microalbumin/Creat Ratio, Urine (82043-MALB)  Patient Instructions: 1)  Call [phone] for your results w/in next 3 days - if there's something important  I feel you need to know,  I'll be in touch with you directly.  2)  Return to office in 3 months, sooner if needed

## 2010-06-14 NOTE — Assessment & Plan Note (Signed)
Summary: FLU SHOT/KLW  Nurse Visit   Flu Vaccine Consent Questions     Do you have a history of severe allergic reactions to this vaccine? no    Any prior history of allergic reactions to egg and/or gelatin? no    Do you have a sensitivity to the preservative Thimersol? no    Do you have a past history of Guillan-Barre Syndrome? no    Do you currently have an acute febrile illness? no    Have you ever had a severe reaction to latex? no    Vaccine information given and explained to patient? yes    Are you currently pregnant? no    Lot Number:[id number]   Exp Date:11/12/2010   Site Given  Left Deltoid IM Reynaldo Minium CMA  February 10, 2010 4:07 PM             Allergies: 1)  ! Lipitor 2)  ! * Drixoral  Orders Added: 1)  Flu Vaccine 60yrs + MEDICARE PATIENTS [Q2039] 2)  Administration Flu vaccine - MCR [G0008]

## 2010-06-14 NOTE — Miscellaneous (Signed)
Summary: BONE DENSITY  Clinical Lists Changes  Orders: Added new Test order of T-Bone Densitometry (77080) - Signed Added new Test order of T-Lumbar Vertebral Assessment (77082) - Signed 

## 2010-07-06 ENCOUNTER — Ambulatory Visit (INDEPENDENT_AMBULATORY_CARE_PROVIDER_SITE_OTHER): Payer: Medicare Other | Admitting: Internal Medicine

## 2010-07-06 ENCOUNTER — Encounter (INDEPENDENT_AMBULATORY_CARE_PROVIDER_SITE_OTHER): Payer: Self-pay | Admitting: *Deleted

## 2010-07-06 ENCOUNTER — Other Ambulatory Visit: Payer: Medicare Other

## 2010-07-06 ENCOUNTER — Other Ambulatory Visit: Payer: Self-pay | Admitting: Internal Medicine

## 2010-07-06 ENCOUNTER — Encounter: Payer: Self-pay | Admitting: Internal Medicine

## 2010-07-06 DIAGNOSIS — D239 Other benign neoplasm of skin, unspecified: Secondary | ICD-10-CM

## 2010-07-06 DIAGNOSIS — J45909 Unspecified asthma, uncomplicated: Secondary | ICD-10-CM

## 2010-07-06 DIAGNOSIS — E119 Type 2 diabetes mellitus without complications: Secondary | ICD-10-CM

## 2010-07-06 DIAGNOSIS — E785 Hyperlipidemia, unspecified: Secondary | ICD-10-CM

## 2010-07-06 LAB — BASIC METABOLIC PANEL
BUN: 14 mg/dL (ref 6–23)
Creatinine, Ser: 0.5 mg/dL (ref 0.4–1.2)
GFR: 114.6 mL/min (ref >60.00)
Glucose, Bld: 118 mg/dL — ABNORMAL HIGH (ref 70–99)

## 2010-07-06 LAB — LIPID PANEL
Cholesterol: 200 mg/dL (ref 0–200)
LDL Cholesterol: 110 mg/dL — ABNORMAL HIGH (ref 0–99)
Triglycerides: 191 mg/dL — ABNORMAL HIGH (ref 0.0–149.0)
VLDL: 38.2 mg/dL (ref 0.0–40.0)

## 2010-07-06 LAB — HEMOGLOBIN A1C: Hgb A1c MFr Bld: 7.4 % — ABNORMAL HIGH (ref 4.6–6.5)

## 2010-07-06 LAB — HEPATIC FUNCTION PANEL
ALT: 16 U/L (ref 0–35)
Albumin: 3.8 g/dL (ref 3.5–5.2)
Total Bilirubin: 0.7 mg/dL (ref 0.3–1.2)

## 2010-07-12 NOTE — Assessment & Plan Note (Signed)
Summary: Pulmonary/ ext ov multiple issus   Primary Provider/Referring Provider:  Sherene Sires  CC:  Hyperlipidemia.  History of Present Illness: 62  yowf never smoker with moderate obesity> adult onset diabetes dx  2002 and hyperlipidemia.     March 17, 2009 ov Acute visit.  Pt c/o cough x 3 days.  Cough is prod with yellow sputum.  She also c/o wheezing.  All started 10/29 with scratchy throat and nasal congestion. on timolol eye drops with no h/o asthma.  rec change to betoptic > resolved.  August 06, 2009 Acute visit.  Pt c/o cough and increased SOB x 2 days.  Cough is prod with minimal yellow sputum.  She also c/o "stuffy head" x 3 days and subjective wheezing.  No chronic symptoms of wheeze or sob.  Take prednisone and doxy until complete Symbicort 80 2 puffs first thing  in am and 2 puffs again in pm about 12 hours later if wheezing or breath  Work on inhaler technique:  relax and blow all the way out then take a nice smooth deep breath back in, triggering the inhaler at same time you start breathing in and hold a few seconds  August 27, 2009 ov cough and SOB  resolved, no need for symbicort, back on timolol and doing fine    March 23, 2010 ov f/u dm, wants off actos due to fda warnings.  no sob, cp or h/o swelling.   >>A1C 7.3 . Actos stopped and metformin two times a day rx   April 13, 2010--Presents for follow up and med review. We orgainzed her meds and updated her med calendar. zetia not working well so rec rechallenge with zocor 20 every other day.  July 06, 2010 ov back on statin on simvastatin 20 mg every other day and no cramps, no tia or claudiation, low or high blood sugar symptoms.  Pt denies any significant sore throat, dysphagia, itching, sneezing,  nasal congestion or excess secretions,  fever, chills, sweats, unintended wt loss, pleuritic or exertional cp, hempoptysis, change in activity tolerance  orthopnea pnd or leg swelling   Current Medications (verified): 1)   Metformin Hcl 500 Mg  Tabs (Metformin Hcl) .... One Tablet Twice Daily 2)  Zocor 20 Mg Tabs (Simvastatin) .Marland Kitchen.. 1 Every Other Day At Bedtime 3)  Vitamin D 1000 Unit  Caps (Cholecalciferol) .... One Daily 4)  Timolol Maleate 0.5 % Solg (Timolol Maleate) .Marland Kitchen.. 1 Drop Each Eye At Bedtime 5)  Travatan 0.004 % Soln (Travoprost) .Marland Kitchen.. 1 Drop Each Eye At Bedtime 6)  Centrum Silver   Tabs (Multiple Vitamins-Minerals) .... Once Daily 7)  Bayer Aspirin 325 Mg  Tabs (Aspirin) .... Once Daily 8)  Tums E-X 750 750 Mg Chew (Calcium Carbonate Antacid) .Marland Kitchen.. 1 Tab With Each Meal 9)  Claritin 10 Mg  Tabs (Loratadine) .... Once Daily As Needed 10)  Delsym 30 Mg/55ml  Lqcr (Dextromethorphan Polistirex) .... 2 Tsp Every 12 Hours As Needed 11)  Afrin Nasal Spray 0.05 % Soln (Oxymetazoline Hcl) .Marland Kitchen.. 1 Spray Every 12 Hours X5 Days 12)  Advil Cold & Sinus Liqui-Gels 30-200 Mg  Caps (Pseudoephedrine-Ibuprofen) .... Per Bottle As Needed 13)  Advil 200 Mg  Caps (Ibuprofen) .... 2-3 With Meals Two Times A Day As Needed 14)  Symbicort 80-4.5 Mcg/act Aero (Budesonide-Formoterol Fumarate) .... 2 Puffs Every 12 Hours As Needed 15)  Ginkoba 40 Mg Tabs (Ginkgo Biloba) .... Use As Needed  Allergies (verified): 1)  ! Lipitor 2)  ! *  Drixoral  Past History:  Past Medical History: ESSENTIAL HYPERTENSION, BENIGN (ICD-401.1) Glaucoma................................................................................[name]/[name] HYPERLIPIDEMIA (ICD-272.4)   -  target LDL less than 70 because of diabetes -Zocor 20mg  every other day at bedtime April 14, 2010>>> LDL 110 July 06, 2010 and no cramps RHINITIS (ICD-472.0) Intermittent asthma     -   exac by Timolol but tolerates unless uri/flare OSTEOPENIA (ICD-733.90)    - DEXA  12/28/07 PA Spine .655, L Hip -1.033, Lfem Neck -1.793   -  DEXA  02/23/10              1.7     L Fem -1.9    Right Fem -1.8 OBESITY (ICD-278.00)   - Target wt  =  148  for BMI < 30  AODM  (ICD-250.00)   Health Maintenance.........................................................................Marland KitchenWert   - Med calendar done 04/02/08   - Td 11/08   - Pneumovax 5/02   - CPX November 25, 2009    - GYN Health Maint : GYN COMPLEX MED REGIMEN-Meds reviewed with pt education and computerized med calendar updated April 13, 2010            Vital Signs:  Patient profile:   77 year old female Weight:      159 pounds O2 Sat:      97 % on Room air Temp:     97.9 degrees F oral Pulse rate:   64 / minute BP sitting:   162 / 72  (left arm)  Vitals Entered By: Vernie Murders (July 06, 2010 9:18 AM)  O2 Flow:  Room air  Physical Exam  Additional Exam:  wt  159 November 05, 2008   >162 April 13, 2010 > 159 July 06, 2010  HEENT: nl dentition, turbinates, and orophanx. Nl external ear canals without cough reflex NECK :  without JVD/Nodes/TM/ nl carotid upstrokes bilaterally LUNGS: no acc muscle,  mild/ moderate sonorous rhonchi exp > isp bilaterally CV:  RRR  no s3 or murmur or increase in P2, no edema   ABD:  soft and nontender with nl excursion in the supine position. No bruits or organomegaly, bowel sounds nl MS:  nl gait, warm without deformities, calf tenderness, cyanosis or clubbing         Impression & Recommendations:  Problem # 1:  AODM (ICD-250.00)  Her updated medication list for this problem includes:    Metformin Hcl 500 Mg Tabs (Metformin hcl) ..... One tablet twice daily    Bayer Aspirin 325 Mg Tabs (Aspirin) ..... Once daily  Labs Reviewed: Creat: 0.5 (04/13/2010)    Reviewed HgBA1c results: 7.3 (03/23/2010) > 7.4 July 06, 2010 so no change in rx at age 56 warranted   7.2 (08/27/2009)  Orders: Est. Patient Level IV (16109)  Problem # 2:  HYPERLIPIDEMIA (ICD-272.4)  The following medications were removed from the medication list:    Simvastatin 20 Mg Tabs (Simvastatin) .Marland Kitchen... Take 1 tablet by mouth every other day at bedtime Her updated  medication list for this problem includes:    Zocor 20 Mg Tabs (Simvastatin) .Marland Kitchen... 1 every other day at bedtime    Labs Reviewed: SGOT: 18 (04/13/2010)   SGPT: 18 (04/13/2010)   HDL:58.00 (04/13/2010), 72.60 (11/25/2009)  LDL:114 (05/28/2009) > 110 July 06, 2010 so no change for now 111 (12/31/2007)  Chol:266 (04/13/2010), 305 (11/25/2009)  Trig:262.0 (04/13/2010), 316.0 (11/25/2009)  Problem # 3:  ASTHMA, UNSPECIFIED (ICD-493.90)  All goals of asthma met including optimal function and elimination of symptoms with minimum  need for rescue therapy. Contingencies discussed today including the rule of two's.   Orders: Est. Patient Level IV (32951)  Problem # 4:  NEVUS (ICD-216.9) re-emphasized derm f/u as prev rec  Medications Added to Medication List This Visit: 1)  Zocor 20 Mg Tabs (Simvastatin) .Marland Kitchen.. 1 every other day at bedtime  Other Orders: TLB-Lipid Panel (80061-LIPID) TLB-BMP (Basic Metabolic Panel-BMET) (80048-METABOL) TLB-Hepatic/Liver Function Pnl (80076-HEPATIC) TLB-A1C / Hgb A1C (Glycohemoglobin) (83036-A1C)  Patient Instructions: 1)  We will call you with results 2)  No change in meds 3)  See calendar for specific medication instructions and bring it back for each and every office visit for every healthcare provider you see.  Without it,  you may not receive the best quality medical care that we feel you deserve.  4)  Return to office in 3 months, sooner if needed  5)  LATE ADD - MAKE SURE SHE SEE'S DERM SOON  Appended Document: Pulmonary/ ext ov multiple issus called and spoke with pt.  instructed her regarding the late addendum to the d/c instructions-----pt will f/u with derm soon.

## 2010-07-22 ENCOUNTER — Other Ambulatory Visit: Payer: Self-pay | Admitting: Dermatology

## 2010-08-25 LAB — TYPE AND SCREEN: ABO/RH(D): A NEG

## 2010-08-25 LAB — CBC
HCT: 29.6 % — ABNORMAL LOW (ref 36.0–46.0)
Hemoglobin: 9.9 g/dL — ABNORMAL LOW (ref 12.0–15.0)
RBC: 3.3 MIL/uL — ABNORMAL LOW (ref 3.87–5.11)
WBC: 9 10*3/uL (ref 4.0–10.5)

## 2010-08-25 LAB — GLUCOSE, CAPILLARY
Glucose-Capillary: 142 mg/dL — ABNORMAL HIGH (ref 70–99)
Glucose-Capillary: 143 mg/dL — ABNORMAL HIGH (ref 70–99)
Glucose-Capillary: 159 mg/dL — ABNORMAL HIGH (ref 70–99)
Glucose-Capillary: 164 mg/dL — ABNORMAL HIGH (ref 70–99)
Glucose-Capillary: 180 mg/dL — ABNORMAL HIGH (ref 70–99)

## 2010-08-29 LAB — DIFFERENTIAL
Basophils Absolute: 0 10*3/uL (ref 0.0–0.1)
Basophils Relative: 0 % (ref 0–1)
Lymphocytes Relative: 15 % (ref 12–46)
Neutro Abs: 6.4 10*3/uL (ref 1.7–7.7)
Neutrophils Relative %: 79 % — ABNORMAL HIGH (ref 43–77)

## 2010-08-29 LAB — BASIC METABOLIC PANEL
CO2: 27 mEq/L (ref 19–32)
Calcium: 9.6 mg/dL (ref 8.4–10.5)
Creatinine, Ser: 0.55 mg/dL (ref 0.4–1.2)
GFR calc Af Amer: >60 mL/min (ref >60)
GFR calc non Af Amer: >60 mL/min (ref >60)
Glucose, Bld: 196 mg/dL — ABNORMAL HIGH (ref 70–99)
Sodium: 135 mEq/L (ref 135–145)

## 2010-08-29 LAB — CBC
Hemoglobin: 12.3 g/dL (ref 12.0–15.0)
MCHC: 33.8 g/dL (ref 30.0–36.0)
RDW: 13.4 % (ref 11.5–15.5)

## 2010-08-29 LAB — URINALYSIS, ROUTINE W REFLEX MICROSCOPIC
Bilirubin Urine: NEGATIVE
Ketones, ur: NEGATIVE mg/dL
Nitrite: NEGATIVE
Protein, ur: NEGATIVE mg/dL

## 2010-08-29 LAB — URINE CULTURE: Colony Count: >=100000

## 2010-08-29 LAB — URINE MICROSCOPIC-ADD ON

## 2010-08-30 LAB — URINALYSIS, ROUTINE W REFLEX MICROSCOPIC
Bilirubin Urine: NEGATIVE
Nitrite: NEGATIVE
Specific Gravity, Urine: 1.022 (ref 1.005–1.030)
Urobilinogen, UA: 0.2 mg/dL (ref 0.0–1.0)
pH: 7 (ref 5.0–8.0)

## 2010-08-30 LAB — COMPREHENSIVE METABOLIC PANEL
Alkaline Phosphatase: 44 U/L (ref 39–117)
BUN: 13 mg/dL (ref 6–23)
Chloride: 100 mEq/L (ref 96–112)
Creatinine, Ser: 0.57 mg/dL (ref 0.4–1.2)
Glucose, Bld: 150 mg/dL — ABNORMAL HIGH (ref 70–99)
Potassium: 4.1 mEq/L (ref 3.5–5.1)
Total Bilirubin: 0.6 mg/dL (ref 0.3–1.2)

## 2010-08-30 LAB — DIFFERENTIAL
Basophils Absolute: 0 10*3/uL (ref 0.0–0.1)
Basophils Relative: 0 % (ref 0–1)
Basophils Relative: 1 % (ref 0–1)
Lymphocytes Relative: 17 % (ref 12–46)
Monocytes Relative: 4 % (ref 3–12)
Neutro Abs: 5.8 10*3/uL (ref 1.7–7.7)
Neutro Abs: 7 10*3/uL (ref 1.7–7.7)
Neutrophils Relative %: 77 % (ref 43–77)
Neutrophils Relative %: 78 % — ABNORMAL HIGH (ref 43–77)

## 2010-08-30 LAB — BASIC METABOLIC PANEL
Chloride: 102 mEq/L (ref 96–112)
GFR calc non Af Amer: >60 mL/min (ref >60)
Glucose, Bld: 192 mg/dL — ABNORMAL HIGH (ref 70–99)
Potassium: 4.4 mEq/L (ref 3.5–5.1)
Sodium: 138 mEq/L (ref 135–145)

## 2010-08-30 LAB — CBC
HCT: 32.4 % — ABNORMAL LOW (ref 36.0–46.0)
Hemoglobin: 12.7 g/dL (ref 12.0–15.0)
Hemoglobin: 10.9 g/dL — ABNORMAL LOW (ref 12.0–15.0)
MCHC: 35.5 g/dL (ref 30.0–36.0)
MCV: 89.4 fL (ref 78.0–100.0)
RBC: 4.1 MIL/uL (ref 3.87–5.11)
RDW: 13.4 % (ref 11.5–15.5)
WBC: 7.6 10*3/uL (ref 4.0–10.5)
WBC: 8.9 10*3/uL (ref 4.0–10.5)

## 2010-09-27 NOTE — Assessment & Plan Note (Signed)
[facility]                             PULMONARY OFFICE NOTE   NAME:Geier, ANTANIYA VENUTI                  MRN:          [id number]  DATE:02/28/2007                            DOB:          [birth year]    PRIMARY SERVICE EXTENDED FOLLOWUP OFFICE VISIT   HISTORY:  A 77 year old white female with multiple concerns. She has  been followed for obesity, diabetes and tells me today that every time  she loses weight she has problems with low sugar spells, which sound  convincing in that she begins to shake and immediately is improved when  she eats. This is the dose of Actos 15 mg daily. She has not had any of  these spells recently and on the other hand is maintaining a higher  weight at around 158. She denies any exertional chest pain, orthopnea,  PND or leg swelling.   The purpose of her visit today is for a repeat hemoglobin A1c with no  overt symptoms of hyper or hypoglycemia since her previous visit. She  also was found to be slightly deficient in vitamin D and carries a  diagnosis of osteopenia and is here for vitamin D levels.   For full inventory of medications, please see medication face sheet  dated February 28, 2007, noting there has been no real change. She  misunderstood her instructions on how to use Nasarel appropriately and  thought she could taper it off completely even though it is listed to  take a minimum of one at bedtime on her calendar. Otherwise, her list  corresponds to how she takes her medications.   PHYSICAL EXAMINATION:  She is a pleasant ambulatory white female in no  acute distress. She has stable vital signs, although blood pressure  156/78.  HEENT: Reveals moderate nonspecific turbinate edema. Oropharynx is  clear.  NECK: Supple without cervical adenopathy or tenderness. Trachea is  midline without lymphadenopathy.  LUNGS: Lung fields are clear bilaterally to auscultation and percussion.  HEART: Regular rate and rhythm  without murmur, gallop or rub.  ABDOMEN: Soft, benign.  EXTREMITIES: Warm without calf tenderness, cyanosis, clubbing or edema.   IMPRESSION:  1. Diabetes secondary to obesity. If she is wholeheartedly ready to      lose weight then I think we need to find a lower dose of Actos or      switch over to metformin as her oral hypoglycemic agent since it      causes less hypoglycemia than Actos, which actually rarely causes      hypoglycemia itself. I discussed the options with her and she is      happy however at her present weight. Therefore, as long as she is      meeting the goals for diabetes and cholesterol, I believe it is      reasonable to continue on the Actos.  2. Osteopenia with borderline low vitamin D levels. I reviewed the      standard recommendations for vitamin D replacement therapy and for      now will recheck a vitamin D level and ask her to increase the  vitamin D up to 800 units a day (because she was on 400 a day when      she was deficient) status post a three month course of vitamin D at      50000 units that was      completed three weeks ago now.  3. General health maintenance. She was due a flu shot today which was      given and also lab work is pending.     Charlaine Dalton. Sherene Sires, MD, [facility]  Electronically Signed    MBW/MedQ  DD: 02/28/2007  DT: 02/28/2007  Job #: [id number]

## 2010-09-27 NOTE — H&P (Signed)
Mikayla [name], Mikayla [name]           ACCOUNT NO.:  [id number]   MEDICAL RECORD NO.:  [id number]          PATIENT TYPE:  AMB   LOCATION:                                FACILITY:  [facility]   PHYSICIAN:  Malachi Pro. Ambrose Mantle, M.D. DATE OF BIRTH:  [birth year]   DATE OF ADMISSION:  07/13/2008  DATE OF DISCHARGE:                              HISTORY & PHYSICAL   PRESENT ILLNESS:  An 77 year old white female para 4-0-0-3 who is  admitted to the hospital for exploratory laparotomy abdominal  hysterectomy and removal of the remaining tube and ovary including a  pelvic mass that is thought to be ovarian in origin.  This patient had  pain in her lower abdomen in January 2010.  She went to the emergency  room and was treated for a urinary tract infection.  She was placed on  Bactrim for 3 days.  The symptoms cleared.  She did not have dysuria,  just pelvic pressure and pain.  Then on June 21, 2008, she had a  bowel movement and then developed pain in her abdomen, nausea, and  retching without vomiting and went to the emergency room again.  A CT  scan showed an 11 cm centrally necrotic pelvic mass that was thought to  probably be a fibroid.  After that she took hydrocodone and did not have  fever.  She reported that she had never had a Pap smear in 30 years and  had not had a pelvic exam the same period of time.  She underwent a  pelvic ultrasound on June 26, 2008 and that showed what appeared to  be 11.7 x 9.9 x 8.0 cm mass most likely arising from an ovary.  The mass  was largely filled with echogenic fluid.  Soft tissue irregular solid  growth was seen along the internal aspects of the walls in several areas  and there was a layering of thick debris within dependent aspects of the  mass.  There was no free fluid present.  There was no a fluid seen in  the abdomen or pelvis.  The CT scan done on  February 10 had shown lung  bases bilaterally with scarring, no pleural or pericardial fluid.  The  liver  and gallbladder appeared normal.  The spleen was normal.  The  pancreas was normal.  The adrenal glands were normal.  The kidneys were  normal.  There were no focal lesions.  There was pronounced  arteriosclerosis of the aorta but no aneurysm.  The inferior vena caval  was unremarkable.  No retroperitoneal mass or adenopathy  was seen.  There was no fluid in the bowel and no bowel pathology was evident.  A  CA-125 was done and was elevated at 148.7.  Pap smear was normal.  Because of the pelvic mass, I offered the patient a consultation with a  gynecologic oncologist.  She wanted to proceed with me doing the  surgery.  I asked Dr. Thornton Dales to assist me with the surgery in  case nodal dissection is necessary.  The patient has continued to have  pain but it is well  controlled with Vicodin.   PAST MEDICAL HISTORY:  No known allergies.  Operations: C-section x2,  cataract surgery x2, and she did have surgery on her optic nerve.  Illnesses:  Adult onset diabetes, hyperlipidemia, obesity, glaucoma.   REVIEW OF SYSTEMS:  No headaches.  No heart problems.  No bowel  problems.   SOCIAL HISTORY:  Alcohol:  Occasional.  No tobacco.  The patient is  retired.  Previously she taught elementary school for 7 years, never  went to college but was able to teach in this elementary school because  it was a [employer] stool.  She worked in the Psychologist, clinical for 6-1/2  years, with a telephone company for 7 years and has lived here for 22  years.   FAMILY HISTORY:  The patient has no living sisters.  She has one living  brother, unremarkable health.  Her mother died of COPD.  Father died  with lung cancer.  She has an 51 year old sister who is not living.  She  has three children living, 64, 39 and 19 years of age.   MEDICATIONS:  Actos, Zetia, Centrum Silver, hydrocodone, vitamin D, ,  aspirin.   PHYSICAL EXAMINATION:  Well-developed, somewhat obese, 5 feet tall, 164  pounds female in no  distress.  HEENT:  Normal.  HEART:  Normal size and sounds.  No murmurs.  LUNGS:  Clear to auscultation.  BREASTS:  Soft without masses.  ABDOMEN:  Soft, obese.  There is a lower abdominal mass that is somewhat  tender.  There are two prior vertical incisions from the pubis to the  umbilicus and one transverse incision suprapubically.  The vulva and vagina are clean.  The cervix is clean.  Uterus is hard to  feel.  There is a lower abdominal mass that is tender.  RECTAL:  Exam is negative.   ADMITTING IMPRESSION:  Pelvic mass with pain, adult onset diabetes,  hyperlipidemia, glaucoma.   The patient is admitted for laparotomy.  She has had an ovary removed  previously and she will undergo abdominal hysterectomy and removal of  the remaining tube and ovary.  The mass will be sent for frozen section.  If it is an ovarian malignancy, consideration will be given to do an  omentectomy and lymph node dissection by Dr. Zachery Dakins.  The patient has  been informed of the risks of surgery including but not limited to heart  attack, stroke, pulmonary embolus, wound disruption, hemorrhage with  need for reoperation and/or transfusion, fistula formation, nerve  injury, intestinal obstruction, hernia formation.  She understands and  agrees to proceed.  I have consulted with Dr. Sherene Sires prior to surgery to  see if he needed to see her preoperatively and he felt like she was a  good candidate for surgery and did not need to see her.      Malachi Pro. Ambrose Mantle, M.D.  Electronically Signed     TFH/MEDQ  D:  07/12/2008  T:  07/12/2008  Job:  [id number]   cc:   Anselm Pancoast. Zachery Dakins, M.D.  [address]  [place]  [place] [address]

## 2010-09-27 NOTE — Op Note (Signed)
NAMEMELANIA, [name] NO.:  [id number]   MEDICAL RECORD NO.:  [id number]           PATIENT TYPE:   LOCATION:                                 FACILITY:   PHYSICIAN:  Mikayla Pro. Ambrose [name], M.D. DATE OF BIRTH:  November 28, 1925   DATE OF PROCEDURE:  DATE OF DISCHARGE:                               OPERATIVE REPORT   PREOPERATIVE DIAGNOSIS:  Large pelvic mass with pain.   POSTOPERATIVE DIAGNOSES:  Large pelvic mass with pain with frozen  section probably benign, possibly borderline, left ovarian neoplasm with  hemorrhage, inflammation, and necrosis, and adherence to the sigmoid  colon and the omentum.   OPERATION:  Laparotomy, removal of pelvic mass, division of adhesions,  abdominal hysterectomy.   OPERATOR:  Mikayla Pro. Ambrose Mantle, MD   ASSISTANT:  Mikayla Pancoast. Zachery Dakins, MD   General anesthesia.   The patient was brought to the operating room and placed under  satisfactory general anesthesia.  The abdomen was prepped from the pubis  to the xiphoid.  The vagina was prepped and then the urethra was  prepped, and a Foley catheter was inserted to straight drain.  The  patient was then placed supine.  The abdomen was draped as a sterile  field, and the abdomen was examined.  The mass was palpable to just  under the umbilicus.  The abdomen was distorted by 2 prior vertical  incisions and a transverse incision.  The midline incision was used and  carried in layers through the skin, subcutaneous tissue, and fascia from  the pubis to the umbilicus.  Almost immediately upon incising the  fascia, the peritoneal cavity was opened, so it was a suggestion that  may be there had been a slight hernia or just weakening of the fascia.  A retractor was placed, and some omental adhesions to the abdominal wall  were lysed on the right side of the midline.  The large mass presented  right underneath the abdominal wall and Dr. Zachery [name] divided adhesions  between the mass and the sigmoid colon  and the mass and the omentum.  Then, I was able to visualize the left ovarian blood supply which the  tumor had torsed on its blood supply.  The tumor itself appeared  somewhat necrotic.  A clamp was placed across the blood supply, and the  tumor was removed after extending the incision above the umbilicus.  This was then sent for frozen section.  Peritoneal washings had already  been obtained.  The omentum appeared completely normal.  The bowel  appeared normal.  The uterus was small, but the patient expressed a  desire to have the uterus removed, and so while we were waiting for the  frozen section report, we removed the uterus by dividing both round  ligaments creating a bladder flap clamping, cutting, and suture ligating  the parametrium and paracervical tissues.  The vaginal angle on the  right was entered and then the uterus was removed by transecting the  upper vagina.  Both vaginal angles were suture ligated, and the central  portion of the vagina was closed with interrupted figure-of-eight  sutures of  0 Vicryl, keeping the bladder well free of the operative  field at all times.  I did palpate the ureters.  They were felt normal.  Even though I did not separately see the uterosacral ligaments, I did  suture the tissue close to the uterosacral ligaments together in the  midline to hopefully provide better support.  There was really no tissue  to reperitonealize over the vaginal cuff.  We suture ligated the left  round ligament and doubly suture ligated the ovarian pedicle.  I  palpated both ureters, they felt normal.  The frozen section report came  back from Dr. Luisa [name] that the tumor in the large ovary was probably  neoplastic, it was probably benign, but possibly borderline in the  ligament.  No evidence of frank carcinoma and with the findings that  there was no evidence of true carcinoma.  The patient being 77 years old  showing significant arteriosclerosis of the aorta on CT  scan, we felt  that the proper procedure was to not do lymph node dissection and  omentectomy.  At this point, liberal irrigation confirmed hemostasis.  Packs and retractors were removed, and the abdominal wall was closed  with interrupted Marcello Moores sutures of #1 Novofil and then placing  interrupted sutures of #1 Novofil in the areas where the fascia seemed  to have a gap.  The subcu tissue was closed with a running suture of 3-0  Vicryl, and the skin was reapproximated with the automatic staples.  The  patient seemed to tolerate the procedure well.  Blood loss was felt to  be no more than 100 mL.  Sponge and needle counts were correct, and she  was returned to recovery in satisfactory condition having put out 350 mL  of clear urine.  During the case, the patient did have 2 events with her  heart.  The anesthetist called the anesthesiologist in to evaluate the  EKG which apparently showed a prolonged PR interval and also at one time  when Dr. Zachery [name] was examining the upper abdomen which he found to be  free of any significant disease.  The patient did experience a  bradycardia down to 32.  This recovered quite quickly.      Mikayla Pro. Ambrose [name], M.D.  Electronically Signed     TFH/MEDQ  D:  07/13/2008  T:  07/13/2008  Job:  [id number]   cc:   Mikayla Dalton. Sherene Sires, MD, FCCP  [address]  [place] [address]   Mikayla Pancoast. Zachery [name], M.D.  [address], [address]  [place]  [place] [address]

## 2010-09-27 NOTE — Discharge Summary (Signed)
Mikayla [name], Mikayla [name]           ACCOUNT NO.:  [id number]   MEDICAL RECORD NO.:  [id number]          PATIENT TYPE:  INP   LOCATION:  9302                          FACILITY:  [facility]   PHYSICIAN:  Malachi Pro. Ambrose Mantle, M.D. DATE OF BIRTH:  [birth year]   DATE OF ADMISSION:  07/13/2008  DATE OF DISCHARGE:  07/15/2008                               DISCHARGE SUMMARY   This is an 77 year old white female who was admitted to the hospital for  surgical exploration of a large pelvic mass with pain.  She underwent a  laparotomy, removal of the pelvic mass with division of adhesions and  abdominal hysterectomy and removal of the mass included the left tube  and ovary.  Procedure was done by Dr. Ambrose Mantle with Dr. Zachery Dakins  assisting under general anesthesia.  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  No evidence of carcinoma.  Postoperatively, the patient did well.  Her initial blood pressures were  elevated at 183/76 as high as 190/65 but subsequently returned toward  normal and the last several blood pressures have been approximately  160/70.  Her output was good.  The hemoglobin was stable.  She tolerated  solid food, did not pass flatus, but was felt to be a candidate for  discharge on the second postop day.  Her temperatures were all normal.  She never became febrile.  Capillary blood glucoses were checked after  each meal, never rose as high as 200, mainly in the range of 137-189.  O2 sats remained 96 or above without oxygen.  Laboratory data, initial  hemoglobin 10.9, hematocrit 32.4, white count 8900, platelet count  371,000.  Followup hemoglobin 9.9.  Comprehensive metabolic profile was  abnormal in that there was a glucose of 150.  Estimated glomerular  filtration rate was greater than 60.  Pathology report other than the  frozen section is not available.   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  Pathology report final is pending.   OPERATION:  Laparotomy, removal of the pelvic mass, division of  adhesions, and removal of the left tube and ovary that was part of the  pelvic mass and the uterus.   FINAL CONDITION:  Improved.   INSTRUCTIONS:  Our regular discharge instructions.  No vaginal entrance,  no heavy lifting or strenuous activity.  Call with any fever greater  than 100.4 degrees.  Call with any unusual problems.  Call with heavy  drainage from the abdominal incision.  Call with vomiting.   MEDICATIONS TO CONTINUE AT HOME:  Actos, Zetia, vitamin D, timolol,  Travatan, aspirin, Tums, and ibuprofen.  I have written a prescription  for Vicodin 5/500, 36 tablets 1 every 4-6 hours as needed for pain, and  she is to apply Silvadene cream to her blisters around her incision  twice a day.  She is to return in 5 days for followup examination.      Maisie Fus  Corwin Levins, M.D.  Electronically Signed     TFH/MEDQ  D:  07/15/2008  T:  07/15/2008  Job:  [id number]   cc:   Anselm Pancoast. Zachery Dakins, M.D.  [address], [address]  [place]  [place] [address]   Charlaine Dalton. Sherene Sires, MD, FCCP  [address]  [place] [address]

## 2010-09-30 NOTE — Assessment & Plan Note (Signed)
[facility]                               PULMONARY OFFICE NOTE   NAME:[name], Mikayla ROSENSTOCK                  MRN:          [id number]  DATE:01/29/2006                            DOB:          29-Aug-1925    PRIMARY SERVICE/EXTENDED FOLLOWUP OFFICE VISIT   A 77 year old, white female in for a followup discussion of multiple issues.  She has morbid obesity and has not been able to make any further headway  toward her goal weight of 133 from her present weight in the mid 150 range.  She is here for followup of hyperlipidemia which by most recent lipid  profile had improved on Zetia down to in the low 100 range but was not able  to tolerate statin, so has been using diet and exercise.  She also was  concerned about bone density issues (see comments below) and diabetes.   She is not able to fingerstick herself but has no overt symptoms of hyper or  hypoglycemia, specifically no polyuria, polydipsia, or polyuria.   For full list of medication, please see face sheet, dated January 29, 2006, which was reviewed line-by-line from her medication counter.   PHYSICAL EXAMINATION:  She is a pleasant, ambulatory, white female in no  acute distress.  She is afebrile, stable vital signs with a weight of 158  pounds.  Blood pressure 146/80.  HEENT:  Unremarkable.  Pharynx clear.  No evidence of postnasal drainage or  cobblestoning.  NECK:  Supple without cervical adenopathy, tenderness.  Trachea is midline.  No thyromegaly.  LUNGS:  Perfectly clear bilaterally on auscultation and percussion.  HEART:  Regular rhythm without murmur, gallop, or rub.  ABDOMEN:  Soft, benign.  EXTREMITIES:  Warm without calf tenderness, cyanosis, clubbing.   Bone density was reviewed of the patient from December 27, 2005, and shows no  significant worsening of mild osteopenia.  In fact, her spine density has  actually increased slightly.  Most recent lipid profile was reviewed with  the patient, indicating her LDL had actually increased slightly from 111 to  125.  Her hemoglobin A1c, however, dropped from 6.2 to 6.0.   I had a long discussion with the patient about options regarding further  medical therapy.  I think it is critical that she commit again to consistent  weight loss through calorie balance issues and consider referring herself  back to Nutrition with a food diary in hand if not able to move toward  goal.  The other option is simply let it go and accept a higher risk of  long-term complications  from diabetes.  Followup will be in the context of comprehensive evaluation  in December 2007.                                  Charlaine Dalton. Sherene Sires, MD, [facility]   MBW/MedQ  DD:  01/29/2006 DT:  01/30/2006 Job #:  [id number]

## 2010-09-30 NOTE — Assessment & Plan Note (Signed)
[facility]                             PULMONARY OFFICE NOTE   NAME:Mikayla [name], Mikayla [name]                  MRN:          [id number]  DATE:05/24/2006                            DOB:          22-Nov-1925    HISTORY OF PRESENT ILLNESS:  Patient is a 77 year old white female  patient of Dr. Thurston Hole who has a known history of diabetes mellitus,  asthmatic bronchitis, rhinitis, and hyperlipidemia, presents for 1 month  followup and medications review.  Patient has brought all of her  medications in today for review which are correct with our medication  list.  Patient has a history of diabetes mellitus, currently maintained  on Actos 15 mg daily.  A1c was slightly elevated from 6.5 up to 6.9.  Patient also has a history of hyperlipidemia, currently on Zetia 10 mg,  total cholesterol 198, and LDL 103.  Patient reports she eats a very low  fat, low cholesterol diet, and has mild-to-moderate exercise, however,  does not consistently exercise.  Patient also complains that since  discontinuing Lyrica for postherpetic neuralgia, that she has been  having some intermittent pains along the left mid subscapular thorax.  Patient had previously been on Lyrica 75 mg 3 times a day, and had been  well-controlled.  Patient reports pain is somewhat mild and intermittent  in nature.  Patient denies any chest pain, shortness of breath,  orthopnea, PND, or leg swelling.   PAST MEDICAL HISTORY:  Reviewed.   CURRENT MEDICATIONS:  Reviewed.   PHYSICAL EXAMINATION:  Patient is a pleasant female in no acute  distress.  She is afebrile.  Stable vital signs.  O2 saturation is 99%  on room air.  HEENT:  Unremarkable.  NECK:  Supple without adenopathy.  No JVD.  Lung sounds are clear to auscultation bilaterally without any wheezing.  CARDIAC:  S1, S2 without murmurs, rubs, or gallops.  ABDOMEN:  Soft and nontender.  EXTREMITIES:  Warm without any calf tenderness, cyanosis,  clubbing, or  edema.   IMPRESSION/PLAN:  1. Diabetes mellitus currently with a slight increase in A1c per labs.      Patient is advised on dietary, exercise measures.  Patient will      recheck here as scheduled with Dr. Sherene Sires in 2 months for a followup      A1c.  If elevated, may need to adjust medications accordingly.  2. Dyslipidemia with an LDL goal of less than 70-100.  Patient, again,      is on Zetia, has been advised on dietary and exercise measures.      Patient declines to add any additional therapy such as statins at      this time.  3. Postherpetic neuralgia with previous shingles and a left T8      distribution in March of 2006.  Patient appears to have been well-      controlled on Lyrica.  Have recommended that patient restart Lyrica      75 mg at bedtime to see if this will control pain.  She will re-      follow back up with Dr. Sherene Sires  in 6-8 weeks or sooner if needed.  4. Complex medication regimen.  Patient's medications reviewed in      detail.  Patient education was provided.  Patient's computerized      medication counter was adjusted accordingly and      reviewed with patient.  5. Tetanus booster was given today.      Rubye Oaks, NP  Electronically Signed      Charlaine Dalton. Sherene Sires, MD, [facility]  Electronically Signed   TP/MedQ  DD: 05/29/2006  DT: 05/29/2006  Job #: [id number]
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
## 2010-09-30 NOTE — Assessment & Plan Note (Signed)
[facility]                             PULMONARY OFFICE NOTE   NAME:Mikayla [name], Mikayla [name]                  MRN:          [id number]  DATE:04/23/2006                            DOB:          1925-09-19    CHIEF COMPLAINT:  Progressive weight gain.   HISTORY:  A 77 year old white female with a history of morbid obesity  complicated by hyperlipidemia and diabetes in for follow up having not  been able to make any headway toward her stated goal weight of 133  pounds.  She says she does not know what she is doing wrong.  She thinks  there must be something wrong with her metabolism.  On further review,  she has received dietary instructions but admits she is not particularly  compliant with diet and also is not consistently exercising.  She  denies, however, any exertional chest pain, orthopnea, TIA or  claudication symptoms, fevers, chills, sweats or cold intolerance.   PAST MEDICAL HISTORY:  1. Diabetes mellitus type 2 diagnosed 2002.  2. Glaucoma followed by Dr. Eulah Pont.  3. She is status post right oophorectomy for cyst.  Declines further      GYN follow-up.  4. Atypical chest pain with negative heart cath in 1997.  5. Mild seasonal rhinitis with sensation of postnasal drainage with      sinusitis documented August 2006.  6. Asthmatic bronchitis typically triggered by exacerbation of      rhinitis.  7. Shingles left T8 distribution March of 2006 with no residual pain.   MEDICATIONS:  Taken in detail from the medication face sheet dated  April 23, 2006 which corresponds at 100% to her medication calendar.   SOCIAL HISTORY:  She has never smoked.  She rarely drinks alcohol.  She  lives with her husband who is a retired Production designer, theatre/television/film for a medical  group in [place].   FAMILY HISTORY:  Positive for lung cancer in her father who was a  smoker.  Mother had COPD and was also a smoker.  Brother had prostate  cancer.  No one else in the family has  any major illnesses to her  knowledge.   REVIEW OF SYSTEMS:  Taken in detail on the worksheet and essentially  negative except as outlined above.  She has occasional aches and pains  in her hands but no significant stiffness or numbness.   PHYSICAL EXAMINATION:  This is a pleasant, ambulatory, moderately obese  white female with a weight of 159 pounds compared to 150 pounds a year  ago versus a target of 132 or 133.  HEENT reveals severe right turbinate  edema more than left with nonspecific features.  Ear canals are clear.  Limited funduscopy was normal.  Dentition was intact.  Neck was supple  without cervical adenopathy or tenderness.  Carotid upstrokes were brisk  without any bruits.  Chest was completely clear bilaterally to  auscultation and percussion.  Breasts were without masses, nipple or  skin changes.  Axillary nodes were negative.  Regular rate and rhythm  without murmur, rub, or gallop.  I know this patient had PMI.  Abdomen  is soft, obese and benign with no palpable organomegaly, masses or  tenderness.  Extremities are warm without calf tenderness, clubbing,  cyanosis, or edema.  Femoral pulses were present bilaterally.  Neurologic shows no focal deficits.  Pathologic reflexes.  Skin exam was  warm and dry.   LABORATORY DATA:  Chest x-ray showed minimally cardiomegaly.  EKG was  normal.  Urinalysis was unremarkable with no microscopic proteinuria.  Hemoglobin A1c was 6.9, TRP was 1, fasting blood sugar was 126.  Total  cholesterol was 198 with an HDL of 63 and an LDL of 103.   IMPRESSION:  1. Morbid obesity with diabetes and hyperlipidemia:  Note, however,      that her cholesterol profile is favorable on Zetia and no further      changes are needed.  I did spend extra time today going over      calorie balance issues with the patient in detail and emphasized      the importance of returning to her nutritionist with her food diary      in hand and also exercise at a  level where she is short of breath      but not out of breath daily.  2. Chronic rhinitis with severe turbinate edema:  I have recommended      the patient resume taking Nizoral on a regular basis and for any      acute symptoms to use Afrin for the first five days to help Nizoral      penetrate through target tissue.  3. Diabetes mellitus with marginal control on her present regimen:  I      did not change it but did recommend again that she move toward her      target rate rather than take additional medications which would of      course incur expense and possible side effects.  4. Glaucoma:  Already on eye drops which include Timolol.  This may be      a problem if she developed significant asthma but does not appear      to have any wheezing or cough unless she is having active rhinitis      or bronchitis.  5. Health maintenance issues:  She again refused colonoscopy and      gynecologic evaluation.  She did agree to bone densitometry      serially and her last study reviewed no significant change in      osteopenia dated August 2007.  It was reviewed with her.  Mammogram      was also reviewed with her from February 2007 and appeared normal.   Follow up will be in one month for the purpose of reviewing all of her  lab studies and providing her with a new updated and user friendly  medication calendar and new MAR for the chart.  If she is not making  progress in her weight at that point, we need to make sure she is being  seen by a nutritionist with a food diary for specific feedback.     Charlaine Dalton. Sherene Sires, MD, [facility]  Electronically Signed    MBW/MedQ  DD: 04/24/2006  DT: 04/25/2006  Job #: [id number]

## 2010-10-04 ENCOUNTER — Encounter: Payer: Self-pay | Admitting: Internal Medicine

## 2010-10-11 ENCOUNTER — Ambulatory Visit (INDEPENDENT_AMBULATORY_CARE_PROVIDER_SITE_OTHER): Payer: Medicare Other | Admitting: Internal Medicine

## 2010-10-11 ENCOUNTER — Other Ambulatory Visit (INDEPENDENT_AMBULATORY_CARE_PROVIDER_SITE_OTHER): Payer: Medicare Other

## 2010-10-11 ENCOUNTER — Encounter: Payer: Self-pay | Admitting: Internal Medicine

## 2010-10-11 ENCOUNTER — Other Ambulatory Visit (INDEPENDENT_AMBULATORY_CARE_PROVIDER_SITE_OTHER): Payer: Medicare Other | Admitting: Internal Medicine

## 2010-10-11 VITALS — BP 140/70 | HR 63 | Temp 97.4°F | Ht 61.0 in | Wt 151.0 lb

## 2010-10-11 DIAGNOSIS — E119 Type 2 diabetes mellitus without complications: Secondary | ICD-10-CM

## 2010-10-11 DIAGNOSIS — E785 Hyperlipidemia, unspecified: Secondary | ICD-10-CM

## 2010-10-11 DIAGNOSIS — M949 Disorder of cartilage, unspecified: Secondary | ICD-10-CM

## 2010-10-11 DIAGNOSIS — E669 Obesity, unspecified: Secondary | ICD-10-CM

## 2010-10-11 DIAGNOSIS — M899 Disorder of bone, unspecified: Secondary | ICD-10-CM

## 2010-10-11 LAB — LIPID PANEL
Cholesterol: 204 mg/dL — ABNORMAL HIGH (ref 0–200)
HDL: 59.5 mg/dL (ref >39.00)
Total CHOL/HDL Ratio: 3
VLDL: 34.4 mg/dL (ref 0.0–40.0)

## 2010-10-11 LAB — BASIC METABOLIC PANEL
Chloride: 101 mEq/L (ref 96–112)
GFR: 105.46 mL/min (ref >60.00)
Potassium: 4.8 mEq/L (ref 3.5–5.1)
Sodium: 139 mEq/L (ref 135–145)

## 2010-10-11 NOTE — Patient Instructions (Signed)
Please schedule a follow up visit in 3 months but call sooner if needed for CPX  See calendar for specific medication instructions and bring it back for each and every office visit for every healthcare provider you see.  Without it,  you may not receive the best quality medical care that we feel you deserve.  You will note that the calendar groups together  your maintenance  medications that are timed at particular times of the day.  Think of this as your checklist for what your doctor has instructed you to do until your next evaluation to see what benefit  there is  to staying on a consistent group of medications intended to keep you well.  The other group at the bottom is entirely up to you to use as you see fit  for specific symptoms that may arise between visits that require you to treat them on an as needed basis.  Think of this as your action plan or "what if" list.   Separating the top medications from the bottom group is fundamental to providing you adequate care going forward.

## 2010-10-11 NOTE — Progress Notes (Signed)
Subjective:     Patient ID: Mikayla [name], female   DOB: Feb 26, 1926, 77 y.o.   MRN: [id number]  HPI 30 yowf never smoker with moderate obesity> adult onset diabetes dx 2002 and hyperlipidemia.   March 17, 2009 ov Acute visit. Pt c/o cough x 3 days. Cough is prod with yellow sputum. She also c/o wheezing. All started 10/29 with scratchy throat and nasal congestion. on timolol eye drops with no h/o asthma. rec change to betoptic > resolved.   March 23, 2010 ov f/u dm, wants off actos due to fda warnings. no sob, cp or h/o swelling. >>A1C 7.3 . Actos stopped and metformin two times a day rx   April 13, 2010--Presents for follow up and med review. We orgainzed her meds and updated her med calendar. zetia not working well so rec rechallenge with zocor 20 every other day.   July 06, 2010 ov back on statin on simvastatin 20 mg every other day and no cramps, no tia or claudiation, low or high blood sugar symptoms. rec no change rx  10/11/10 ov/[name]  Cc f/u dm/ lipids. No muscle cramps on zocor 20 qod. Pt denies any significant sore throat, dysphagia, itching, sneezing,  nasal congestion or excess/ purulent secretions,  fever, chills, sweats, unintended wt loss, pleuritic or exertional cp, hempoptysis, orthopnea pnd or leg swelling.    Also denies any obvious fluctuation of symptoms with weather or environmental changes or other aggravating or alleviating factors.     Allergies   1) ! Lipitor  2) ! * Drixoral   :  Past Medical History:  ESSENTIAL HYPERTENSION, BENIGN (ICD-401.1)  Glaucoma................................................................................[name]/[name]  HYPERLIPIDEMIA (ICD-272.4)  - target LDL less than 70 because of diabetes  -Zocor 20mg  every other day at bedtime April 14, 2010>>> LDL 110 July 06, 2010 and no cramps  RHINITIS (ICD-472.0)  Intermittent asthma  - exac by Timolol but tolerates unless uri/flare  OSTEOPENIA (ICD-733.90)  -  DEXA 12/28/07 PA Spine .655, L Hip -1.033, Lfem Neck -1.793  - DEXA 02/23/10 1.7 L Fem -1.9 Right Fem -1.8  OBESITY (ICD-278.00)  - Target wt = 148 for BMI < 30  AODM (ICD-250.00)  Health Maintenance.........................................................................Marland KitchenWert  - Med calendar done 04/02/08  - Td 11/08  - Pneumovax 5/02  - CPX November 25, 2009  - GYN Health Maint : GYN  COMPLEX MED REGIMEN-Meds reviewed with pt education and computerized med calendar updated April 13, 2010       Review of Systems     Objective:   Physical Exam   wt 159 November 05, 2008 >162 April 13, 2010 > 159 July 06, 2010 > 151 10/11/2010  HEENT: nl dentition, turbinates, and orophanx. Nl external ear canals without cough reflex  NECK : without JVD/Nodes/TM/ nl carotid upstrokes bilaterally  LUNGS: no acc muscle, mild/ moderate sonorous rhonchi exp > isp bilaterally  CV: RRR no s3 or murmur or increase in P2, no edema  ABD: soft and nontender with nl excursion in the supine position. No bruits or organomegaly, bowel sounds nl  MS: nl gait, warm without deformities, calf tenderness, cyanosis or clubbing     Assessment:         Plan:

## 2010-10-12 ENCOUNTER — Encounter: Payer: Self-pay | Admitting: Internal Medicine

## 2010-10-12 NOTE — Progress Notes (Signed)
Quick Note:  Spoke with pt and notified of results per Dr. Sherene Sires. Pt verbalized understanding and denied any questions. Will mail copy to pt per her request- address was verified.  ______

## 2010-10-12 NOTE — Assessment & Plan Note (Signed)
Hgb a1c trending up slowly on metformin vs actos but wt trending down so for now no change in rx needed

## 2010-10-12 NOTE — Assessment & Plan Note (Signed)
LDL not at goal yet but hdl quite good and h/o poor statin tol so leave the zocor at 20 mg qod

## 2010-10-12 NOTE — Assessment & Plan Note (Signed)
Much better wt control p change to metformin

## 2010-11-30 ENCOUNTER — Telehealth: Payer: Self-pay | Admitting: Internal Medicine
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
## 2010-12-01 ENCOUNTER — Ambulatory Visit (INDEPENDENT_AMBULATORY_CARE_PROVIDER_SITE_OTHER): Payer: Medicare Other | Admitting: Adult Health

## 2010-12-01 ENCOUNTER — Encounter: Payer: Self-pay | Admitting: Adult Health

## 2010-12-01 VITALS — BP 154/66 | HR 65 | Temp 98.1°F | Ht 60.0 in | Wt 150.6 lb

## 2010-12-01 DIAGNOSIS — F419 Anxiety disorder, unspecified: Secondary | ICD-10-CM | POA: Insufficient documentation

## 2010-12-01 DIAGNOSIS — F411 Generalized anxiety disorder: Secondary | ICD-10-CM

## 2010-12-01 MED ORDER — ALPRAZOLAM 0.25 MG PO TABS: 0.2500 mg | ORAL_TABLET | Freq: Every day | ORAL | Status: DC | PRN
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
## 2010-12-01 NOTE — Patient Instructions (Signed)
Fluids and rest  Ice and heat to sore joints/muscles As needed  Please contact office for sooner follow up if symptoms do not improve or worsen or seek emergency care  May use Xanax 0.25mg 1/2-1 daily As needed Nerves      
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
## 2011-01-11 ENCOUNTER — Ambulatory Visit (INDEPENDENT_AMBULATORY_CARE_PROVIDER_SITE_OTHER): Payer: Medicare Other | Admitting: Internal Medicine

## 2011-01-11 ENCOUNTER — Other Ambulatory Visit (INDEPENDENT_AMBULATORY_CARE_PROVIDER_SITE_OTHER): Payer: Medicare Other

## 2011-01-11 ENCOUNTER — Encounter: Payer: Self-pay | Admitting: Internal Medicine

## 2011-01-11 ENCOUNTER — Other Ambulatory Visit: Payer: Self-pay | Admitting: Internal Medicine

## 2011-01-11 DIAGNOSIS — E785 Hyperlipidemia, unspecified: Secondary | ICD-10-CM

## 2011-01-11 DIAGNOSIS — E119 Type 2 diabetes mellitus without complications: Secondary | ICD-10-CM

## 2011-01-11 DIAGNOSIS — M899 Disorder of bone, unspecified: Secondary | ICD-10-CM

## 2011-01-11 DIAGNOSIS — J45909 Unspecified asthma, uncomplicated: Secondary | ICD-10-CM

## 2011-01-11 DIAGNOSIS — I10 Essential (primary) hypertension: Secondary | ICD-10-CM

## 2011-01-11 DIAGNOSIS — M949 Disorder of cartilage, unspecified: Secondary | ICD-10-CM

## 2011-01-11 LAB — CBC WITH DIFFERENTIAL/PLATELET
Basophils Relative: 0.2 % (ref 0.0–3.0)
Eosinophils Absolute: 0.2 10*3/uL (ref 0.0–0.7)
HCT: 35.9 % — ABNORMAL LOW (ref 36.0–46.0)
Hemoglobin: 12.1 g/dL (ref 12.0–15.0)
Lymphs Abs: 1.6 10*3/uL (ref 0.7–4.0)
MCHC: 33.6 g/dL (ref 30.0–36.0)
MCV: 89.8 fl (ref 78.0–100.0)
Monocytes Absolute: 0.5 10*3/uL (ref 0.1–1.0)
Neutro Abs: 3.6 10*3/uL (ref 1.4–7.7)
Neutrophils Relative %: 61.4 % (ref 43.0–77.0)
RBC: 3.99 Mil/uL (ref 3.87–5.11)

## 2011-01-11 LAB — URINALYSIS, ROUTINE W REFLEX MICROSCOPIC
Bilirubin Urine: NEGATIVE
Hgb urine dipstick: NEGATIVE
Ketones, ur: NEGATIVE
Nitrite: NEGATIVE
Specific Gravity, Urine: 1.01
Total Protein, Urine: NEGATIVE
Urine Glucose: NEGATIVE
Urobilinogen, UA: 0.2
pH: 7 (ref 5.0–8.0)

## 2011-01-11 LAB — BASIC METABOLIC PANEL WITH GFR
BUN: 15 mg/dL (ref 6–23)
CO2: 31 meq/L (ref 19–32)
Calcium: 9.2 mg/dL (ref 8.4–10.5)
Chloride: 102 meq/L (ref 96–112)
Creatinine, Ser: 0.5 mg/dL (ref 0.4–1.2)
GFR: 131.12 mL/min
Glucose, Bld: 148 mg/dL — ABNORMAL HIGH (ref 70–99)
Potassium: 4 meq/L (ref 3.5–5.1)
Sodium: 141 meq/L (ref 135–145)

## 2011-01-11 LAB — HEPATIC FUNCTION PANEL
Albumin: 4 g/dL (ref 3.5–5.2)
Bilirubin, Direct: 0.1 mg/dL (ref 0.0–0.3)
Total Protein: 6.8 g/dL (ref 6.0–8.3)

## 2011-01-11 LAB — LIPID PANEL
HDL: 54.9 mg/dL (ref >39.00)
VLDL: 46.2 mg/dL — ABNORMAL HIGH (ref 0.0–40.0)

## 2011-01-11 LAB — LDL CHOLESTEROL, DIRECT: Direct LDL: 130 mg/dL

## 2011-01-11 LAB — MICROALBUMIN / CREATININE URINE RATIO: Creatinine,U: 53.4 mg/dL
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
## 2011-01-11 NOTE — Assessment & Plan Note (Signed)
All goals of chronic asthma control met including optimal function and elimination of symptoms with minimal need for rescue therapy.  Contingencies discussed in full including contacting this office immediately if not controlling the symptoms using the rule of two's.    

## 2011-01-11 NOTE — Patient Instructions (Signed)
See calendar for specific medication instructions and bring it back for each and every office visit for every healthcare provider you see.  Without it,  you may not receive the best quality medical care that we feel you deserve.  You will note that the calendar groups together  your maintenance  medications that are timed at particular times of the day.  Think of this as your checklist for what your doctor has instructed you to do until your next evaluation to see what benefit  there is  to staying on a consistent group of medications intended to keep you well.  The other group at the bottom is entirely up to you to use as you see fit  for specific symptoms that may arise between visits that require you to treat them on an as needed basis.  Think of this as your action plan or "what if" list.   Separating the top medications from the bottom group is fundamental to providing you adequate care going forward.    We will with your lab results  Please schedule a follow up visit in 3 months but call sooner if needed

## 2011-01-11 NOTE — Assessment & Plan Note (Signed)
Lab Results  Component Value Date   CREATININE 0.5 01/11/2011   CREATININE 0.6 10/11/2010   CREATININE 0.5 07/06/2010    Adequate control on present rx, reviewed

## 2011-01-11 NOTE — Assessment & Plan Note (Signed)
Not at goal so rechallenge with zocor 20 mg per day and see if tolerates

## 2011-01-12 NOTE — Progress Notes (Signed)
Quick Note:  Spoke with pt and notified of results per Dr. [name]. Pt verbalized understanding and denied any questions.  ______ 

## 2011-01-25 ENCOUNTER — Ambulatory Visit (INDEPENDENT_AMBULATORY_CARE_PROVIDER_SITE_OTHER): Payer: Medicare Other

## 2011-01-25 DIAGNOSIS — Z23 Encounter for immunization: Secondary | ICD-10-CM

## 2011-02-09 ENCOUNTER — Telehealth: Payer: Self-pay | Admitting: Internal Medicine

## 2011-02-09 NOTE — Telephone Encounter (Signed)
Called and spoke with pt. Pt states she recently had a root canal done last week d/t an abcess and was given Amoxicillin and Motrin to take.  States she was taking the Amoxicillin qid as prescribed but became nauseated so over the last few days has only been taking bid.  Last night she noticed her neck itched and today she woke up to rash all over her body- arms, legs, and face.  States the rash looks like "blotches" and is red and itchy.  Denies any swelling of her tongue or face and no increased sob.  Pt states she doesn't recall ever having any reactions to these meds in the past.  Pt denies any other med changes recently.  Pt is requesting recs.  Please advise.  Thank you.   Allergies  Allergen Reactions  . Atorvastatin     REACTION: aches

## 2011-02-09 NOTE — Telephone Encounter (Signed)
Called and spoke with pt.  Informed her of VS's recs.  Pt verbalized understanding and denied any questions.

## 2011-02-09 NOTE — Telephone Encounter (Signed)
She should stop taking amoxicillin and contact her dentist for an alternative antibiotic.    She can use benadryl 50 mg every 6 hours as needed for rash and itching.  Advised her to use caution when taking benadryl since this can cause drowsiness, and she should not drink alcohol with this medication.    Advise her to go to the emergency room if she notices swelling in her throat/tongue/lips, and/or feels like she has trouble with her breathing.  She can call back for ROV if rash persists after few days.

## 2011-03-03 ENCOUNTER — Telehealth: Payer: Self-pay | Admitting: Internal Medicine

## 2011-03-03 NOTE — Telephone Encounter (Signed)
Error.  Sent to voice mail.  Mikayla [name]

## 2011-03-28 ENCOUNTER — Telehealth: Payer: Self-pay | Admitting: Internal Medicine

## 2011-03-28 MED ORDER — DOXYCYCLINE HYCLATE 100 MG PO TABS: 100.0000 mg | ORAL_TABLET | Freq: Two times a day (BID) | ORAL | Status: AC

## 2011-03-28 NOTE — Telephone Encounter (Signed)
Per SN: okay for doxycycline 100mg  #14, 1 po bid.  Called spoke with patient, advised of SN's recs as stated above.  Pt okay with this and verbalized her understanding.  rx sent to verified pharmacy.

## 2011-03-28 NOTE — Telephone Encounter (Signed)
Spoke with pt. She states had a head cold about 1 wk ago and this has started to clear up, but for the past 2 days has had a productive cough with green sputum. Denies any SOB, f/c/s. I advised that MW out of the office this afternoon and offered her ov with TP for tomorrow and she declined this, stating that there is no need for appt b/c "this is the same thing that happens every year and I just need abx". Pt with hx of asthma. Looks like he has given her doxy in the past. Will forward to doc of the day- pls advise, thanks! Allergies  Allergen Reactions  . Amoxicillin Rash  . Atorvastatin     REACTION: aches

## 2011-04-27 ENCOUNTER — Encounter: Payer: Self-pay | Admitting: Internal Medicine

## 2011-04-27 ENCOUNTER — Other Ambulatory Visit (INDEPENDENT_AMBULATORY_CARE_PROVIDER_SITE_OTHER): Payer: Medicare Other

## 2011-04-27 ENCOUNTER — Ambulatory Visit (INDEPENDENT_AMBULATORY_CARE_PROVIDER_SITE_OTHER): Payer: Medicare Other | Admitting: Internal Medicine

## 2011-04-27 ENCOUNTER — Other Ambulatory Visit: Payer: Self-pay | Admitting: Internal Medicine

## 2011-04-27 VITALS — BP 132/70 | HR 65 | Temp 98.1°F | Ht 60.0 in | Wt 148.2 lb

## 2011-04-27 DIAGNOSIS — J45909 Unspecified asthma, uncomplicated: Secondary | ICD-10-CM

## 2011-04-27 DIAGNOSIS — E785 Hyperlipidemia, unspecified: Secondary | ICD-10-CM

## 2011-04-27 DIAGNOSIS — E119 Type 2 diabetes mellitus without complications: Secondary | ICD-10-CM

## 2011-04-27 LAB — HEPATIC FUNCTION PANEL
ALT: 17 U/L (ref 0–35)
AST: 18 U/L (ref 0–37)
Alkaline Phosphatase: 37 U/L — ABNORMAL LOW (ref 39–117)
Bilirubin, Direct: 0 mg/dL (ref 0.0–0.3)
Total Bilirubin: 0.6 mg/dL (ref 0.3–1.2)
Total Protein: 6.8 g/dL (ref 6.0–8.3)

## 2011-04-27 LAB — BASIC METABOLIC PANEL
BUN: 16 mg/dL (ref 6–23)
CO2: 28 mEq/L (ref 19–32)
Calcium: 9.3 mg/dL (ref 8.4–10.5)
Creatinine, Ser: 0.6 mg/dL (ref 0.4–1.2)
GFR: 103.27 mL/min (ref >60.00)
Glucose, Bld: 157 mg/dL — ABNORMAL HIGH (ref 70–99)
Sodium: 141 mEq/L (ref 135–145)

## 2011-04-27 NOTE — Assessment & Plan Note (Signed)
Adequate control on present rx, reviewed  
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
## 2011-04-27 NOTE — Progress Notes (Signed)
Quick Note:  Spoke with pt and notified of results per Dr. [name]. Pt verbalized understanding and denied any questions.  ______ 

## 2011-04-27 NOTE — Patient Instructions (Signed)
Continue to use your med calendar we updated today.  Prefer you use Betoptic exclusively  But will need to be monitored by your eye doctor  For am cough, try taking pepcid 20 mg one at bedtime until it goes away for several weeks  GERD (REFLUX)  is an extremely common cause of respiratory symptoms, many times with no significant heartburn at all.    It can be treated with medication, but also with lifestyle changes including avoidance of late meals, excessive alcohol, smoking cessation, and avoid fatty foods, chocolate, peppermint, colas, red wine, and acidic juices such as orange juice.  NO MINT OR MENTHOL PRODUCTS SO NO COUGH DROPS  USE SUGARLESS CANDY INSTEAD (jolley ranchers or Stover's)  NO OIL BASED VITAMINS - use powdered substitutes.   Please schedule a follow up visit in 3 months but call sooner if needed

## 2011-04-27 NOTE — Assessment & Plan Note (Signed)
Flare of am cough in setting of use of timolol ? Noct reflux > try pepcid qhs whenever coughing and strongly prefer Betoptic eye drops    Each maintenance medication was reviewed in detail including most importantly the difference between maintenance and as needed and under what circumstances the prns are to be used.  This was done in the context of a medication calendar review which provided the patient with a user-friendly unambiguous mechanism for medication administration and reconciliation and provides an action plan for all active problems. It is critical that this be shown to every doctor  for modification during the office visit if necessary so the patient can use it as a working document. Marland Kitchen

## 2011-05-02 ENCOUNTER — Other Ambulatory Visit: Payer: Self-pay | Admitting: Allergy

## 2011-05-02 MED ORDER — METFORMIN HCL ER (MOD) 500 MG PO TB24: 500.0000 mg | ORAL_TABLET | Freq: Two times a day (BID) | ORAL | Status: DC

## 2011-05-03 ENCOUNTER — Telehealth: Payer: Self-pay | Admitting: Allergy

## 2011-05-03 MED ORDER — METFORMIN HCL 500 MG PO TABS: 500.0000 mg | ORAL_TABLET | Freq: Two times a day (BID) | ORAL | Status: DC

## 2011-05-03 NOTE — Telephone Encounter (Signed)
Should stay on metformin

## 2011-05-03 NOTE — Telephone Encounter (Signed)
[facility] [phone] PT PREFERS METFORMIN TO THE GLUMETZA 500 MG  TAKES 1 TABLET BID  WITH MEALS DR Sherene Sires IS THIS OK TO CHANGE AND FILL  Allergies  Allergen Reactions  . Amoxicillin Rash  . Atorvastatin     REACTION: aches

## 2011-05-03 NOTE — Telephone Encounter (Signed)
Called [facility], was told that 12.18.12 rx was for glumetza which is the extended release metformin.  Per MW, okay to stay with the metformin 500mg  bid.  This was given verbally to [facility] rep.  Updated on med list.  Nothing further needed.

## 2011-05-08 ENCOUNTER — Other Ambulatory Visit: Payer: Self-pay | Admitting: Allergy

## 2011-06-06 DIAGNOSIS — H353290 Exudative age-related macular degeneration, unspecified eye, stage unspecified: Secondary | ICD-10-CM | POA: Diagnosis not present

## 2011-06-06 DIAGNOSIS — H353190 Nonexudative age-related macular degeneration, unspecified eye, stage unspecified: Secondary | ICD-10-CM | POA: Diagnosis not present

## 2011-06-07 ENCOUNTER — Other Ambulatory Visit: Payer: Self-pay | Admitting: Allergy

## 2011-06-07 MED ORDER — METFORMIN HCL 500 MG PO TABS: 500.0000 mg | ORAL_TABLET | Freq: Two times a day (BID) | ORAL | Status: DC

## 2011-06-07 NOTE — Telephone Encounter (Signed)
[facility] REQUESTING RX FOR  METFORMIN RX SENT

## 2011-07-31 ENCOUNTER — Encounter: Payer: Self-pay | Admitting: Internal Medicine

## 2011-07-31 ENCOUNTER — Other Ambulatory Visit (INDEPENDENT_AMBULATORY_CARE_PROVIDER_SITE_OTHER): Payer: Medicare Other

## 2011-07-31 ENCOUNTER — Ambulatory Visit (INDEPENDENT_AMBULATORY_CARE_PROVIDER_SITE_OTHER): Payer: Medicare Other | Admitting: Internal Medicine

## 2011-07-31 VITALS — BP 120/70 | HR 64 | Temp 97.8°F | Ht 60.5 in | Wt 148.8 lb

## 2011-07-31 DIAGNOSIS — E119 Type 2 diabetes mellitus without complications: Secondary | ICD-10-CM

## 2011-07-31 DIAGNOSIS — J45909 Unspecified asthma, uncomplicated: Secondary | ICD-10-CM

## 2011-07-31 DIAGNOSIS — E785 Hyperlipidemia, unspecified: Secondary | ICD-10-CM | POA: Diagnosis not present

## 2011-07-31 LAB — BASIC METABOLIC PANEL
GFR: 97.46 mL/min (ref >60.00)
Glucose, Bld: 142 mg/dL — ABNORMAL HIGH (ref 70–99)
Potassium: 4.6 mEq/L (ref 3.5–5.1)
Sodium: 138 mEq/L (ref 135–145)

## 2011-07-31 NOTE — Patient Instructions (Signed)
Please remember to go to the lab department downstairs for your tests - we will call you with the results when they are available.  Please schedule a follow up visit in 3 months but call sooner if needed.  See calendar for specific medication instructions and bring it back for each and every office visit for every healthcare provider you see.  Without it,  you may not receive the best quality medical care that we feel you deserve.  You will note that the calendar groups together  your maintenance  medications that are timed at particular times of the day.  Think of this as your checklist for what your doctor has instructed you to do until your next evaluation to see what benefit  there is  to staying on a consistent group of medications intended to keep you well.  The other group at the bottom is entirely up to you to use as you see fit  for specific symptoms that may arise between visits that require you to treat them on an as needed basis.  Think of this as your action plan or "what if" list.   Separating the top medications from the bottom group is fundamental to providing you adequate care going forward.

## 2011-07-31 NOTE — Progress Notes (Signed)
Subjective:     Patient ID: Mikayla [name], female   DOB: 09/16/1925 .   MRN: [id number]  HPI 57 yowf never smoker with moderate obesity> adult onset diabetes dx 2002 and hyperlipidemia.   March 17, 2009 ov Acute visit. Pt c/o cough x 3 days. Cough is prod with yellow sputum. She also c/o wheezing. All started 10/29 with scratchy throat and nasal congestion. on timolol eye drops with no h/o asthma. rec change to betoptic > resolved.   March 23, 2010 ov f/u dm, wants off actos due to fda warnings. no sob, cp or h/o swelling. >>A1C 7.3 . Actos stopped and metformin two times a day rx     04/27/2011 f/u ov/Lillieann Pavlich cc increase cough and wheeze on Timolol requiring symbicort x 48 hours no purulent sputum fever cp overt sinus or reflux symptoms, cough worst first thing in am, abrupt in onset p "getting over a cold" rec Continue to use your med calendar we updated today. Prefer you use Betoptic exclusively  But will need to be monitored by your eye doctor For am cough, try taking pepcid 20 mg one at bedtime until it goes away for several weeks GERD diet reviewed   07/31/2011 f/u ov/Donika Butner all better from last flare  cc no sob no cough no overt symptoms of high or low sugars but continues to decline self testing. No need for saba daytime back on timolol ok  Sleeping ok without nocturnal  or early am exacerbation  of respiratory  c/o's or need for noct saba. Also denies any obvious fluctuation of symptoms with weather or environmental changes or other aggravating or alleviating factors except as outlined above    ROS  At present neg for  any significant sore throat, dysphagia, itching, sneezing,  nasal congestion or excess/ purulent secretions,  fever, chills, sweats, unintended wt loss, pleuritic or exertional cp, hempoptysis, orthopnea pnd or leg swelling.  Also denies presyncope, palpitations, heartburn, abdominal pain, nausea, vomiting, diarrhea  or change in bowel or urinary habits,  dysuria,hematuria,  rash, arthralgias, visual complaints, headache, numbness weakness or ataxia.        Allergies   1) ! Lipitor  2) ! * Drixoral   :  Past Medical History:  ESSENTIAL HYPERTENSION, BENIGN (ICD-401.1)  Glaucoma................................................................................[name]/[name]  HYPERLIPIDEMIA (ICD-272.4)  - target LDL less than 70 because of diabetes  -Zocor 20mg  every other day at bedtime April 14, 2010>>> LDL 110 July 06, 2010 and no cramps  RHINITIS (ICD-472.0)  Intermittent asthma  - exac by Timolol but tolerates unless uri/flare  OSTEOPENIA (ICD-733.90)  - DEXA 12/28/07 PA Spine .655, L Hip -1.033, Lfem Neck -1.793  - DEXA 02/23/10 1.7 L Fem -1.9 Right Fem -1.8  OBESITY (ICD-278.00)  - Target wt = 148 for BMI < 30  AODM (ICD-250.00)  Health Maintenance.........................................................................Marland KitchenWert  - Med calendar done 04/02/08  - Td 03/2007 - Pneumovax 09/2000 - CPX Jan 11 2011  - GYN Health Maint : GYN  COMPLEX MED REGIMEN-Meds reviewed with pt education and computerized med calendar updated April 13, 2010               Objective:   Physical Exam   wt 159 November 05, 2008 >162 April 13, 2010 > 159 July 06, 2010 > 151 10/11/2010 >150 12/01/2010 > 150 01/11/2011 > 04/27/2011  148> 148 07/31/2011  HEENT: nl dentition, turbinates, and orophanx. Nl external ear canals without cough reflex  NECK : without JVD/Nodes/TM/ nl carotid upstrokes bilaterally  LUNGS: no  acc muscle, mild/ moderate sonorous rhonchi exp > isp bilaterally  CV: RRR no s3 or murmur or increase in P2, no edema  ABD: soft and nontender with nl excursion in the supine position. No bruits or organomegaly, bowel sounds nl  MS: nl gait, warm without deformities, calf tenderness, cyanosis or clubbing  Neuro: intact with no focal deficits noted.     Assessment:         Plan:

## 2011-08-01 ENCOUNTER — Encounter: Payer: Self-pay | Admitting: Internal Medicine

## 2011-08-01 NOTE — Assessment & Plan Note (Signed)
All goals of chronic asthma control met including optimal function and elimination of symptoms with minimal need for rescue therapy.  Contingencies discussed in full including contacting this office immediately if not controlling the symptoms using the rule of two's.    

## 2011-08-01 NOTE — Progress Notes (Signed)
Quick Note:  Spoke with pt and notified of results per Dr. [name]. Pt verbalized understanding and denied any questions.  ______ 

## 2011-08-01 NOTE — Assessment & Plan Note (Signed)
-   Target LDL < 100 since has DM but poor statin tolerance   Lab Results  Component Value Date   CHOL 244* 04/27/2011   HDL 59.40 04/27/2011   LDLCALC 110* 07/06/2010   LDLDIRECT 131.6 04/27/2011   TRIG 244.0* 04/27/2011   CHOLHDL 4 04/27/2011   Not ideal but poor tol statins so no change rx x work on wt at age 78 the NNT to drive this lower is unknown but probably quite high

## 2011-08-01 NOTE — Assessment & Plan Note (Signed)
Adequate control on present rx, reviewed  

## 2011-09-07 DIAGNOSIS — H409 Unspecified glaucoma: Secondary | ICD-10-CM | POA: Diagnosis not present

## 2011-09-07 DIAGNOSIS — H4011X0 Primary open-angle glaucoma, stage unspecified: Secondary | ICD-10-CM | POA: Diagnosis not present

## 2011-09-07 DIAGNOSIS — H402290 Chronic angle-closure glaucoma, unspecified eye, stage unspecified: Secondary | ICD-10-CM | POA: Diagnosis not present

## 2011-09-07 DIAGNOSIS — H353290 Exudative age-related macular degeneration, unspecified eye, stage unspecified: Secondary | ICD-10-CM | POA: Diagnosis not present

## 2011-09-28 ENCOUNTER — Other Ambulatory Visit: Payer: Self-pay | Admitting: *Deleted

## 2011-09-28 MED ORDER — METFORMIN HCL 500 MG PO TABS: 500.0000 mg | ORAL_TABLET | Freq: Two times a day (BID) | ORAL | Status: DC

## 2011-10-30 ENCOUNTER — Other Ambulatory Visit (INDEPENDENT_AMBULATORY_CARE_PROVIDER_SITE_OTHER): Payer: Medicare Other

## 2011-10-30 ENCOUNTER — Encounter: Payer: Self-pay | Admitting: Internal Medicine

## 2011-10-30 ENCOUNTER — Ambulatory Visit (INDEPENDENT_AMBULATORY_CARE_PROVIDER_SITE_OTHER): Payer: Medicare Other | Admitting: Internal Medicine

## 2011-10-30 VITALS — BP 140/70 | HR 68 | Temp 98.1°F | Ht 61.0 in | Wt 148.2 lb

## 2011-10-30 DIAGNOSIS — J45909 Unspecified asthma, uncomplicated: Secondary | ICD-10-CM

## 2011-10-30 DIAGNOSIS — E119 Type 2 diabetes mellitus without complications: Secondary | ICD-10-CM

## 2011-10-30 DIAGNOSIS — M949 Disorder of cartilage, unspecified: Secondary | ICD-10-CM

## 2011-10-30 DIAGNOSIS — M899 Disorder of bone, unspecified: Secondary | ICD-10-CM

## 2011-10-30 DIAGNOSIS — E785 Hyperlipidemia, unspecified: Secondary | ICD-10-CM | POA: Diagnosis not present

## 2011-10-30 DIAGNOSIS — E669 Obesity, unspecified: Secondary | ICD-10-CM

## 2011-10-30 LAB — BASIC METABOLIC PANEL
CO2: 32 mEq/L (ref 19–32)
Chloride: 103 mEq/L (ref 96–112)
Potassium: 5 mEq/L (ref 3.5–5.1)
Sodium: 140 mEq/L (ref 135–145)

## 2011-10-30 LAB — LDL CHOLESTEROL, DIRECT: Direct LDL: 137.3 mg/dL

## 2011-10-30 LAB — HEPATIC FUNCTION PANEL
Alkaline Phosphatase: 36 U/L — ABNORMAL LOW (ref 39–117)
Bilirubin, Direct: 0.1 mg/dL (ref 0.0–0.3)
Total Bilirubin: 0.5 mg/dL (ref 0.3–1.2)
Total Protein: 6.8 g/dL (ref 6.0–8.3)

## 2011-10-30 LAB — LIPID PANEL
Total CHOL/HDL Ratio: 4
VLDL: 57.4 mg/dL — ABNORMAL HIGH (ref 0.0–40.0)

## 2011-10-30 MED ORDER — METFORMIN HCL 500 MG PO TABS: 500.0000 mg | ORAL_TABLET | Freq: Two times a day (BID) | ORAL | Status: DC

## 2011-10-30 NOTE — Assessment & Plan Note (Signed)
Wt Readings from Last 3 Encounters:  10/30/11 148 lb 3.2 oz (67.223 kg)  07/31/11 148 lb 12.8 oz (67.495 kg)  04/27/11 148 lb 3.2 oz (67.223 kg)    She is at target for bmi < 30 but not yet at ideal body wt, discussed

## 2011-10-30 NOTE — Assessment & Plan Note (Signed)
Adequate control on present rx, reviewed contingencies

## 2011-10-30 NOTE — Patient Instructions (Addendum)
See calendar for specific medication instructions and bring it back for each and every office visit for every healthcare provider you see.  Without it,  you may not receive the best quality medical care that we feel you deserve.  You will note that the calendar groups together  your maintenance  medications that are timed at particular times of the day.  Think of this as your checklist for what your doctor has instructed you to do until your next evaluation to see what benefit  there is  to staying on a consistent group of medications intended to keep you well.  The other group at the bottom is entirely up to you to use as you see fit  for specific symptoms that may arise between visits that require you to treat them on an as needed basis.  Think of this as your action plan or "what if" list.   Separating the top medications from the bottom group is fundamental to providing you adequate care going forward.    Please remember to go to the lab   department downstairs for your tests - we will call you with the results when they are available.     Please schedule a follow up visit in 3 months but call sooner if needed for CPX on return.

## 2011-10-30 NOTE — Assessment & Plan Note (Signed)
Adequate control on present rx, reviewed need to control wt

## 2011-10-30 NOTE — Assessment & Plan Note (Signed)
-   DEXA 12/28/07 PA Spine .655, L Hip -1.033, Lfem Neck -1.793  - DEXA 02/23/10 1.7 L Fem -1.9 Right Fem -1.8   Check Vit D levels

## 2011-10-30 NOTE — Progress Notes (Signed)
Subjective:     Patient ID: Mikayla [name], female   DOB: 01/24/1926 .   MRN: [id number]  HPI 53 yowf never smoker with moderate obesity> adult onset diabetes dx 2002 and hyperlipidemia.   March 17, 2009 ov Acute visit. Pt c/o cough x 3 days. Cough is prod with yellow sputum. She also c/o wheezing. All started 10/29 with scratchy throat and nasal congestion. on timolol eye drops with no h/o asthma. rec change to betoptic > resolved.   March 23, 2010 ov f/u dm, wants off actos due to fda warnings. no sob, cp or h/o swelling. >>A1C 7.3 . Actos stopped and metformin two times a day rx     04/27/2011 f/u ov/Moses Odoherty cc increase cough and wheeze on Timolol requiring symbicort x 48 hours no purulent sputum fever cp overt sinus or reflux symptoms, cough worst first thing in am, abrupt in onset p "getting over a cold" rec Continue to use your med calendar we updated today. Prefer you use Betoptic exclusively  But will need to be monitored by your eye doctor For am cough, try taking pepcid 20 mg one at bedtime until it goes away for several weeks GERD diet reviewed   07/31/2011 f/u ov/Abdirahman Chittum all better from last flare  cc no sob no cough no overt symptoms of high or low sugars but continues to decline self testing. No need for saba daytime back on timolol ok rec Follow calendar, no change rx  10/30/2011 f/u ov/Deardra Hinkley for yearly eval multiple chronic conditions: dm/ hyperlipidemia/asthma no new c/o's with no tia/claudication/ symptoms of high or low sugars, declines self testing. Not able to lose additional wt, no cough or limiting sob  Sleeping ok without nocturnal  or early am exacerbation  of respiratory  c/o's or need for noct saba. Also denies any obvious fluctuation of symptoms with weather or environmental changes or other aggravating or alleviating factors except as outlined above    ROS  At present neg for  any significant sore throat, dysphagia, itching, sneezing,  nasal congestion or excess/  purulent secretions,  fever, chills, sweats, unintended wt loss, pleuritic or exertional cp, hempoptysis, orthopnea pnd or leg swelling.  Also denies presyncope, palpitations, heartburn, abdominal pain, nausea, vomiting, diarrhea  or change in bowel or urinary habits, dysuria,hematuria,  rash, arthralgias, visual complaints, headache, numbness weakness or ataxia.        Allergies   1) ! Lipitor  2) ! * Drixoral   :  Past Medical History:  ESSENTIAL HYPERTENSION, BENIGN (ICD-401.1)  Glaucoma................................................................................[name]/[name]  HYPERLIPIDEMIA (ICD-272.4)  - target LDL less than 70 because of diabetes  -Zocor 20mg  every other day at bedtime April 14, 2010>>> LDL 110 July 06, 2010 and no recurrent  cramps  RHINITIS (ICD-472.0)  Intermittent asthma  - exac by Timolol but tolerates unless uri/flare  OSTEOPENIA (ICD-733.90)  - DEXA 12/28/07 PA Spine .655, L Hip -1.033, Lfem Neck -1.793  - DEXA 02/23/10 1.7 L Fem -1.9 Right Fem -1.8  OBESITY (ICD-278.00)  - Target wt = 148 for BMI < 30  AODM (ICD-250.00)  Health Maintenance.........................................................................Marland KitchenWert  - Td 03/2007 - Pneumovax 09/2000 - CPX 10/30/2011  - GYN Health Maint : GYN                Objective:   Physical Exam   wt 159 November 05, 2008 >162 April 13, 2010 > 159 July 06, 2010 > 151 10/11/2010 >150 12/01/2010 > 150 01/11/2011 > 04/27/2011  148> 148 07/31/2011 > 10/30/2011  148 HEENT: nl dentition, turbinates, and orophanx. Nl external ear canals without cough reflex  NECK : without JVD/Nodes/TM/ nl carotid upstrokes bilaterally  LUNGS: no acc muscle, mild/ moderate sonorous rhonchi exp > isp bilaterally  CV: RRR no s3 or murmur or increase in P2, no edema  ABD: soft and nontender with nl excursion in the supine position. No bruits or organomegaly, bowel sounds nl  MS: nl gait, warm without deformities,  calf tenderness, cyanosis or clubbing  Neuro: intact with no focal deficits noted.  Skin: no lestions    Assessment:         Plan:

## 2011-10-30 NOTE — Assessment & Plan Note (Signed)
-   Target LDL < 100 since has DM but poor statin tolerance   Adequate control on present rx, reviewed unlikley to tolerate higher statin doses

## 2011-10-31 NOTE — Progress Notes (Signed)
Quick Note:  Spoke with pt and notified of results per Dr. [name]. Pt verbalized understanding and denied any questions.  ______ 

## 2011-11-01 ENCOUNTER — Telehealth: Payer: Self-pay | Admitting: Internal Medicine

## 2011-11-01 MED ORDER — METFORMIN HCL 500 MG PO TABS: 500.0000 mg | ORAL_TABLET | Freq: Two times a day (BID) | ORAL | Status: DC

## 2011-11-01 NOTE — Telephone Encounter (Signed)
RX has been resent and pt aware. Nothing further was needed

## 2011-12-05 DIAGNOSIS — H409 Unspecified glaucoma: Secondary | ICD-10-CM | POA: Diagnosis not present

## 2011-12-05 DIAGNOSIS — H4010X0 Unspecified open-angle glaucoma, stage unspecified: Secondary | ICD-10-CM | POA: Diagnosis not present

## 2011-12-05 DIAGNOSIS — H353290 Exudative age-related macular degeneration, unspecified eye, stage unspecified: Secondary | ICD-10-CM | POA: Diagnosis not present

## 2011-12-05 DIAGNOSIS — H353190 Nonexudative age-related macular degeneration, unspecified eye, stage unspecified: Secondary | ICD-10-CM | POA: Diagnosis not present

## 2012-01-04 DIAGNOSIS — H353290 Exudative age-related macular degeneration, unspecified eye, stage unspecified: Secondary | ICD-10-CM | POA: Diagnosis not present

## 2012-01-11 DIAGNOSIS — H353290 Exudative age-related macular degeneration, unspecified eye, stage unspecified: Secondary | ICD-10-CM | POA: Diagnosis not present

## 2012-01-31 ENCOUNTER — Other Ambulatory Visit (INDEPENDENT_AMBULATORY_CARE_PROVIDER_SITE_OTHER): Payer: Medicare Other

## 2012-01-31 ENCOUNTER — Encounter: Payer: Self-pay | Admitting: Internal Medicine

## 2012-01-31 ENCOUNTER — Ambulatory Visit (INDEPENDENT_AMBULATORY_CARE_PROVIDER_SITE_OTHER): Payer: Medicare Other | Admitting: Internal Medicine

## 2012-01-31 VITALS — BP 132/68 | HR 63 | Temp 97.4°F | Ht 60.0 in | Wt 147.4 lb

## 2012-01-31 DIAGNOSIS — Z23 Encounter for immunization: Secondary | ICD-10-CM

## 2012-01-31 DIAGNOSIS — J45909 Unspecified asthma, uncomplicated: Secondary | ICD-10-CM

## 2012-01-31 DIAGNOSIS — E119 Type 2 diabetes mellitus without complications: Secondary | ICD-10-CM

## 2012-01-31 DIAGNOSIS — M899 Disorder of bone, unspecified: Secondary | ICD-10-CM | POA: Diagnosis not present

## 2012-01-31 DIAGNOSIS — E785 Hyperlipidemia, unspecified: Secondary | ICD-10-CM

## 2012-01-31 DIAGNOSIS — M549 Dorsalgia, unspecified: Secondary | ICD-10-CM

## 2012-01-31 DIAGNOSIS — M949 Disorder of cartilage, unspecified: Secondary | ICD-10-CM

## 2012-01-31 LAB — CBC WITH DIFFERENTIAL/PLATELET
Basophils Absolute: 0 10*3/uL (ref 0.0–0.1)
Basophils Relative: 0.3 % (ref 0.0–3.0)
Eosinophils Absolute: 0.3 10*3/uL (ref 0.0–0.7)
Lymphocytes Relative: 28.1 % (ref 12.0–46.0)
MCHC: 33.4 g/dL (ref 30.0–36.0)
Neutrophils Relative %: 60.3 % (ref 43.0–77.0)
RBC: 4.17 Mil/uL (ref 3.87–5.11)
WBC: 5.7 10*3/uL (ref 4.5–10.5)

## 2012-01-31 LAB — HEPATIC FUNCTION PANEL
Alkaline Phosphatase: 37 U/L — ABNORMAL LOW (ref 39–117)
Bilirubin, Direct: 0.1 mg/dL (ref 0.0–0.3)
Total Protein: 6.8 g/dL (ref 6.0–8.3)

## 2012-01-31 LAB — URINALYSIS, ROUTINE W REFLEX MICROSCOPIC
Specific Gravity, Urine: <=1.005 (ref 1.000–1.030)
Total Protein, Urine: NEGATIVE
Urine Glucose: NEGATIVE

## 2012-01-31 LAB — BASIC METABOLIC PANEL
CO2: 30 mEq/L (ref 19–32)
Calcium: 9.4 mg/dL (ref 8.4–10.5)
Chloride: 102 mEq/L (ref 96–112)
Potassium: 4.6 mEq/L (ref 3.5–5.1)
Sodium: 140 mEq/L (ref 135–145)

## 2012-01-31 LAB — MICROALBUMIN / CREATININE URINE RATIO
Creatinine,U: 31.9 mg/dL
Microalb Creat Ratio: 0.6 mg/g (ref 0.0–30.0)
Microalb, Ur: 0.2 mg/dL (ref 0.0–1.9)

## 2012-01-31 MED ORDER — SIMVASTATIN 20 MG PO TABS: 20.0000 mg | ORAL_TABLET | Freq: Every day | ORAL | Status: DC

## 2012-01-31 NOTE — Patient Instructions (Addendum)
Please remember to go to the x-ray department downstairs for your tests - we will call you with the results when they are available.  See calendar for specific medication instructions and bring it back for each and every office visit for every healthcare provider you see.  Without it,  you may not receive the best quality medical care that we feel you deserve.  You will note that the calendar groups together  your maintenance  medications that are timed at particular times of the day.  Think of this as your checklist for what your doctor has instructed you to do until your next evaluation to see what benefit  there is  to staying on a consistent group of medications intended to keep you well.  The other group at the bottom is entirely up to you to use as you see fit  for specific symptoms that may arise between visits that require you to treat them on an as needed basis.  Think of this as your action plan or "what if" list.   Separating the top medications from the bottom group is fundamental to providing you adequate care going forward.    Please schedule a follow up visit in 3 months but call sooner if needed to see Tammy on return for new med calendar and fasting lab bloodwork

## 2012-01-31 NOTE — Progress Notes (Signed)
Subjective:     Patient ID: Mikayla [name], female   DOB: 1926-05-12 .   MRN: [id number]  HPI 58 yowf never smoker with moderate obesity> adult onset diabetes dx 2002 and hyperlipidemia.   March 17, 2009 ov Acute visit. Pt c/o cough x 3 days. Cough is prod with yellow sputum. She also c/o wheezing. All started 10/29 with scratchy throat and nasal congestion. on timolol eye drops with no h/o asthma. rec change to betoptic > resolved.   March 23, 2010 ov f/u dm, wants off actos due to fda warnings. no sob, cp or h/o swelling. >>A1C 7.3 . Actos stopped and metformin two times a day rx     04/27/2011 f/u ov/[name] cc increase cough and wheeze on Timolol requiring symbicort x 48 hours no purulent sputum fever cp overt sinus or reflux symptoms, cough worst first thing in am, abrupt in onset p "getting over a cold" rec Continue to use your med calendar we updated today. Prefer you use Betoptic exclusively  But will need to be monitored by your eye doctor For am cough, try taking pepcid 20 mg one at bedtime until it goes away for several weeks GERD diet reviewed   07/31/2011 f/u ov/[name] all better from last flare  cc no sob no cough no overt symptoms of high or low sugars but continues to decline self testing. No need for saba daytime back on timolol ok rec Follow calendar, no change rx  10/30/2011 f/u ov/[name] for yearly eval multiple chronic conditions: dm/ hyperlipidemia/asthma no new c/o's with no tia/claudication/ symptoms of high or low sugars, declines self testing. Not able to lose additional wt, no cough or limiting sob rec Follow calendar, no change in rx  01/31/2012 f/u ov/[name] cc multiple chronic medical problems main c/o  L sacroiliac area pain s radiation aggravated by housework first noted x 2 days - no weakness or numbness or abn gait.  No change with urination or bm's. No cough or sob.  Sleeping ok without nocturnal  or early am exacerbation  of respiratory  c/o's or need for  noct saba. Also denies any obvious fluctuation of symptoms with weather or environmental changes or other aggravating or alleviating factors except as outlined above   ROS  The following are not active complaints unless bolded sore throat, dysphagia, dental problems, itching, sneezing,  nasal congestion or excess/ purulent secretions, ear ache,   fever, chills, sweats, unintended wt loss, pleuritic or exertional cp, hemoptysis,  orthopnea pnd or leg swelling, presyncope, palpitations, heartburn, abdominal pain, anorexia, nausea, vomiting, diarrhea  or change in bowel or urinary habits, change in stools or urine, dysuria,hematuria,  rash, arthralgias, visual complaints, headache, numbness weakness or ataxia or problems with walking or coordination,  change in mood/affect or memory.            Allergies   1) ! Lipitor  2) ! * Drixoral   :  Past Medical History:  ESSENTIAL HYPERTENSION, BENIGN (ICD-401.1)  Glaucoma................................................................................[name]/[name]  HYPERLIPIDEMIA (ICD-272.4)  - target LDL less than 70 because of diabetes  -Zocor 20mg  every other day at bedtime April 14, 2010>>> LDL 110 July 06, 2010 and no recurrent  cramps  RHINITIS (ICD-472.0)  Intermittent asthma  - exac by Timolol but tolerates unless uri/flare  OSTEOPENIA (ICD-733.90)  - DEXA 12/28/07 PA Spine .655, L Hip -1.033, Lfem Neck -1.793  - DEXA 02/23/10 1.7 L Fem -1.9 Right Fem -1.8  OBESITY (ICD-278.00)  - Target wt = 148 for BMI <  30  AODM (ICD-250.00)  Health Maintenance.........................................................................Marland KitchenWert  - Td 03/2007 - Pneumovax 09/2000 - CPX 10/30/2011  - GYN Health Maint : GYN [name], declines mammography               Objective:   Physical Exam   wt 159 November 05, 2008 >162 April 13, 2010 > 159 July 06, 2010 > 151 10/11/2010 >150 12/01/2010 > 150 01/11/2011 > 04/27/2011  148> 148  07/31/2011 > 10/30/2011  148 > 01/31/2012  147  HEENT: nl dentition, turbinates, and orophanx. Nl external ear canals without cough reflex  NECK : without JVD/Nodes/TM/ nl carotid upstrokes bilaterally  LUNGS: no acc muscle, mild/ moderate sonorous rhonchi exp > isp bilaterally  CV: RRR no s3 or murmur or increase in P2, no edema  ABD: soft and nontender with nl excursion in the supine position. No bruits or organomegaly, bowel sounds nl  MS: nl gait, warm without deformities, calf tenderness, cyanosis or clubbing, neg slr, from both hips  Neuro: intact with no focal deficits noted - nl gait.  Skin: no lestions    Assessment:         Plan:

## 2012-02-01 ENCOUNTER — Ambulatory Visit: Payer: Medicare Other | Admitting: Internal Medicine

## 2012-02-01 NOTE — Progress Notes (Signed)
Quick Note:  Spoke with pt and notified of results per Dr. [name]. Pt verbalized understanding and denied any questions.  ______ 

## 2012-02-04 DIAGNOSIS — M549 Dorsalgia, unspecified: Secondary | ICD-10-CM | POA: Insufficient documentation

## 2012-02-04 NOTE — Assessment & Plan Note (Signed)
Not Adequate control on present rx, reviewed options, declined referral to lipid clinic

## 2012-02-04 NOTE — Assessment & Plan Note (Signed)
Adequate control on present rx, reviewed  

## 2012-02-04 NOTE — Assessment & Plan Note (Signed)
L sacroiliac pain > try nsaids first then refer to ortho prn

## 2012-02-04 NOTE — Assessment & Plan Note (Signed)
Adequate control on present rx, reviewed action plans

## 2012-02-08 DIAGNOSIS — H353290 Exudative age-related macular degeneration, unspecified eye, stage unspecified: Secondary | ICD-10-CM | POA: Diagnosis not present

## 2012-03-07 DIAGNOSIS — H353290 Exudative age-related macular degeneration, unspecified eye, stage unspecified: Secondary | ICD-10-CM | POA: Diagnosis not present

## 2012-04-04 DIAGNOSIS — H353290 Exudative age-related macular degeneration, unspecified eye, stage unspecified: Secondary | ICD-10-CM | POA: Diagnosis not present

## 2012-04-24 DIAGNOSIS — H353290 Exudative age-related macular degeneration, unspecified eye, stage unspecified: Secondary | ICD-10-CM | POA: Diagnosis not present

## 2012-04-24 DIAGNOSIS — H4011X0 Primary open-angle glaucoma, stage unspecified: Secondary | ICD-10-CM | POA: Diagnosis not present

## 2012-04-24 DIAGNOSIS — H409 Unspecified glaucoma: Secondary | ICD-10-CM | POA: Diagnosis not present

## 2012-05-02 ENCOUNTER — Encounter: Payer: Medicare Other | Admitting: Adult Health

## 2012-05-16 ENCOUNTER — Encounter: Payer: Medicare Other | Admitting: Adult Health

## 2012-05-27 ENCOUNTER — Other Ambulatory Visit: Payer: Self-pay | Admitting: Internal Medicine

## 2012-05-27 ENCOUNTER — Encounter: Payer: Medicare Other | Admitting: Adult Health

## 2012-05-27 ENCOUNTER — Other Ambulatory Visit (INDEPENDENT_AMBULATORY_CARE_PROVIDER_SITE_OTHER): Payer: Medicare Other

## 2012-05-27 DIAGNOSIS — E785 Hyperlipidemia, unspecified: Secondary | ICD-10-CM

## 2012-05-27 DIAGNOSIS — E119 Type 2 diabetes mellitus without complications: Secondary | ICD-10-CM | POA: Diagnosis not present

## 2012-05-27 LAB — HEPATIC FUNCTION PANEL
ALT: 16 U/L (ref 0–35)
Bilirubin, Direct: 0.1 mg/dL (ref 0.0–0.3)
Total Bilirubin: 0.8 mg/dL (ref 0.3–1.2)
Total Protein: 6.7 g/dL (ref 6.0–8.3)

## 2012-05-27 LAB — LIPID PANEL
Cholesterol: 251 mg/dL — ABNORMAL HIGH (ref 0–200)
Triglycerides: 270 mg/dL — ABNORMAL HIGH (ref 0.0–149.0)
VLDL: 54 mg/dL — ABNORMAL HIGH (ref 0.0–40.0)

## 2012-05-27 LAB — HEMOGLOBIN A1C: Hgb A1c MFr Bld: 7.3 % — ABNORMAL HIGH (ref 4.6–6.5)

## 2012-05-27 NOTE — Progress Notes (Signed)
Quick Note:  Spoke with pt and notified of results per Dr. [name]. Pt verbalized understanding and denied any questions.  ______ 

## 2012-06-04 ENCOUNTER — Encounter: Payer: Self-pay | Admitting: Adult Health

## 2012-06-04 ENCOUNTER — Ambulatory Visit (INDEPENDENT_AMBULATORY_CARE_PROVIDER_SITE_OTHER): Payer: Medicare Other | Admitting: Adult Health

## 2012-06-04 VITALS — BP 134/64 | HR 82 | Temp 98.3°F | Ht 60.0 in | Wt 143.8 lb

## 2012-06-04 DIAGNOSIS — J45909 Unspecified asthma, uncomplicated: Secondary | ICD-10-CM

## 2012-06-04 DIAGNOSIS — E785 Hyperlipidemia, unspecified: Secondary | ICD-10-CM | POA: Diagnosis not present

## 2012-06-04 DIAGNOSIS — Z23 Encounter for immunization: Secondary | ICD-10-CM | POA: Diagnosis not present

## 2012-06-04 DIAGNOSIS — E119 Type 2 diabetes mellitus without complications: Secondary | ICD-10-CM

## 2012-06-04 NOTE — Assessment & Plan Note (Signed)
Compensated on present regimen without flare  

## 2012-06-04 NOTE — Patient Instructions (Addendum)
Low fat cholesterol diet.  Restart Zocor as discussed.  Keep sweets low.  Pneumovax today  follow up Dr. Sherene Sires  In 3 months for fasting labs and physical

## 2012-06-04 NOTE — Progress Notes (Signed)
Subjective:     Patient ID: Mikayla [name], female   DOB: 11-26-1925 .   MRN: [id number]  HPI 89 yowf never smoker with moderate obesity> adult onset diabetes dx 2002 and hyperlipidemia.   March 17, 2009 ov Acute visit. Pt c/o cough x 3 days. Cough is prod with yellow sputum. She also c/o wheezing. All started 10/29 with scratchy throat and nasal congestion. on timolol eye drops with no h/o asthma. rec change to betoptic > resolved.   March 23, 2010 ov f/u dm, wants off actos due to fda warnings. no sob, cp or h/o swelling. >>A1C 7.3 . Actos stopped and metformin two times a day rx     04/27/2011 f/u ov/[name] cc increase cough and wheeze on Timolol requiring symbicort x 48 hours no purulent sputum fever cp overt sinus or reflux symptoms, cough worst first thing in am, abrupt in onset p "getting over a cold" rec Continue to use your med calendar we updated today. Prefer you use Betoptic exclusively  But will need to be monitored by your eye doctor For am cough, try taking pepcid 20 mg one at bedtime until it goes away for several weeks GERD diet reviewed   07/31/2011 f/u ov/[name] all better from last flare  cc no sob no cough no overt symptoms of high or low sugars but continues to decline self testing. No need for saba daytime back on timolol ok rec Follow calendar, no change rx  10/30/2011 f/u ov/[name] for yearly eval multiple chronic conditions: dm/ hyperlipidemia/asthma no new c/o's with no tia/claudication/ symptoms of high or low sugars, declines self testing. Not able to lose additional wt, no cough or limiting sob rec Follow calendar, no change in rx  01/31/2012 f/u ov/[name] cc multiple chronic medical problems main c/o  L sacroiliac area pain s radiation aggravated by housework first noted x 2 days - no weakness or numbness or abn gait.  No change with urination or bm's. No cough or sob. >  06/04/2012 Follow up and Med review  Patient returns for a three-month followup and  medication review. We reviewed all her medications and organized them into a medication calendar with patient education. Patient had fasting labs done last week. Patient's cholesterol is not at goal. She reports that she has not been taking her simvastatin on a consistent basis. We discussed the dangers of hyperlipidemia, along with her underlying diabetes. Patient has agreed to restart simvastatin take it on a daily basis. She will return in 3 months with fasting labs. Hemoglobin A1c was elevated at 7.3 , to discuss better diet, and weight management planned. Patient will return in 3 months and have a repeat hemoglobin A1c if not improving. Need additional medications. Patient denies any chest pain, palpitations, orthopnea, PND, or leg swelling We discussed routine colonoscopies as in the past. Patient has refused on multiple occasions to have a screening colonoscopy. She declines cards. We also discussed routine mammograms, which she has declined having in the past as well. I have discussed benefit of colonoscopies and mammograms. However, she continues to decline.              Allergies   1) ! Lipitor  2) ! * Drixoral   :  Past Medical History:  ESSENTIAL HYPERTENSION, BENIGN (ICD-401.1)  Glaucoma................................................................................[name]/[name]  HYPERLIPIDEMIA (ICD-272.4)  - target LDL less than 70 because of diabetes  -Zocor 20mg  every other day at bedtime April 14, 2010>>> LDL 110 July 06, 2010 and no recurrent  cramps  RHINITIS (ICD-472.0)  Intermittent asthma  - exac by Timolol but tolerates unless uri/flare  OSTEOPENIA (ICD-733.90)  - DEXA 12/28/07 PA Spine .655, L Hip -1.033, Lfem Neck -1.793  - DEXA 02/23/10 1.7 L Fem -1.9 Right Fem -1.8  OBESITY (ICD-278.00)  - Target wt = 148 for BMI < 30  AODM (ICD-250.00)  Health Maintenance.........................................................................Marland KitchenWert  - Td  03/2007 - Pneumovax 09/2000 - CPX 10/30/2011  - GYN Health Maint : GYN [name], declines mammography and colonoscopy  --med calendar 06/04/2012        Constitutional:   No  weight loss, night sweats,  Fevers, chills, + fatigue, or  lassitude.  HEENT:   No headaches,  Difficulty swallowing,  Tooth/dental problems, or  Sore throat,                No sneezing, itching, ear ache, nasal congestion, post nasal drip,   CV:  No chest pain,  Orthopnea, PND, swelling in lower extremities, anasarca, dizziness, palpitations, syncope.   GI  No heartburn, indigestion, abdominal pain, nausea, vomiting, diarrhea, change in bowel habits, loss of appetite, bloody stools.   Resp: No shortness of breath with exertion or at rest.  No excess mucus, no productive cough,  No non-productive cough,  No coughing up of blood.  No change in color of mucus.  No wheezing.  No chest wall deformity  Skin: no rash or lesions.  GU: no dysuria, change in color of urine, no urgency or frequency.  No flank pain, no hematuria   MS:  No joint pain or swelling.  No decreased range of motion.  No back pain.  Psych:  No change in mood or affect. No depression or anxiety.  No memory loss.             Objective:   Physical Exam   wt 159 November 05, 2008 >162 April 13, 2010 > 159 July 06, 2010 > 151 10/11/2010 >150 12/01/2010 > 150 01/11/2011 > 04/27/2011  148> 148 07/31/2011 > 10/30/2011  148 > 01/31/2012  147 >143 06/04/2012  HEENT: nl dentition, turbinates, and orophanx. Nl external ear canals without cough reflex  NECK : without JVD/Nodes/TM/ nl carotid upstrokes bilaterally  LUNGS: no acc muscle, CTA  CV: RRR no s3 or murmur or increase in P2, no edema  ABD: soft and nontender with nl excursion in the supine position. No bruits or organomegaly, bowel sounds nl  MS: nl gait, warm without deformities, calf tenderness, cyanosis or clubbing,   Neuro: intact with no focal deficits noted - nl gait.  Skin: no lestions     Assessment:         Plan:

## 2012-06-04 NOTE — Assessment & Plan Note (Signed)
LDL currently not a go. Patient is to restart her Zocor as discussed. Low-fat, low-cholesterol diet. Return 2 months with fasting labs with repeat lipid panel. Patient's medications were reviewed today and patient education was given. Computerized medication calendar was adjusted/completed

## 2012-06-04 NOTE — Assessment & Plan Note (Signed)
Hemoglobin A1c currently not at ideal goal. Patient is encouraged on dietary measures along with weight loss. Repeat hemoglobin A1c in 3 months if not improved will need to add in additional therapy

## 2012-07-09 ENCOUNTER — Telehealth: Payer: Self-pay | Admitting: Internal Medicine

## 2012-07-09 MED ORDER — METFORMIN HCL 500 MG PO TABS: 500.0000 mg | ORAL_TABLET | Freq: Two times a day (BID) | ORAL | Status: DC

## 2012-07-09 NOTE — Telephone Encounter (Signed)
1.21.14 med cal w/ TP 9.18.13 ov w/ MW  Refills metformin sent to [facility] Patient's husband is aware Will sign off

## 2012-09-04 ENCOUNTER — Ambulatory Visit: Payer: Medicare Other | Admitting: Internal Medicine

## 2012-09-23 ENCOUNTER — Other Ambulatory Visit (INDEPENDENT_AMBULATORY_CARE_PROVIDER_SITE_OTHER): Payer: Medicare Other

## 2012-09-23 ENCOUNTER — Encounter: Payer: Self-pay | Admitting: Internal Medicine

## 2012-09-23 ENCOUNTER — Ambulatory Visit (INDEPENDENT_AMBULATORY_CARE_PROVIDER_SITE_OTHER): Payer: Medicare Other | Admitting: Internal Medicine

## 2012-09-23 VITALS — BP 140/78 | HR 64 | Temp 97.9°F | Ht 60.0 in | Wt 149.0 lb

## 2012-09-23 DIAGNOSIS — E785 Hyperlipidemia, unspecified: Secondary | ICD-10-CM | POA: Diagnosis not present

## 2012-09-23 DIAGNOSIS — E119 Type 2 diabetes mellitus without complications: Secondary | ICD-10-CM

## 2012-09-23 DIAGNOSIS — E669 Obesity, unspecified: Secondary | ICD-10-CM | POA: Diagnosis not present

## 2012-09-23 LAB — HEMOGLOBIN A1C: Hgb A1c MFr Bld: 6.7 % — ABNORMAL HIGH (ref 4.6–6.5)

## 2012-09-23 LAB — BASIC METABOLIC PANEL
BUN: 20 mg/dL (ref 6–23)
Calcium: 9.5 mg/dL (ref 8.4–10.5)
Creatinine, Ser: 0.5 mg/dL (ref 0.4–1.2)
GFR: 119.07 mL/min (ref >60.00)
Glucose, Bld: 127 mg/dL — ABNORMAL HIGH (ref 70–99)
Potassium: 4.7 mEq/L (ref 3.5–5.1)

## 2012-09-23 LAB — HEPATIC FUNCTION PANEL
AST: 18 U/L (ref 0–37)
Albumin: 4 g/dL (ref 3.5–5.2)
Total Bilirubin: 0.6 mg/dL (ref 0.3–1.2)

## 2012-09-23 LAB — LDL CHOLESTEROL, DIRECT: Direct LDL: 147.3 mg/dL

## 2012-09-23 NOTE — Progress Notes (Signed)
Subjective:     Patient ID: Mikayla [name], female   DOB: June 30, 1925 .   MRN: [id number]  HPI 30 yowf never smoker with moderate obesity> adult onset diabetes dx 2002 and hyperlipidemia.   March 17, 2009 ov Acute visit. Pt c/o cough x 3 days. Cough is prod with yellow sputum. She also c/o wheezing. All started 10/29 with scratchy throat and nasal congestion. on timolol eye drops with no h/o asthma. rec change to betoptic > resolved.   March 23, 2010 ov f/u dm, wants off actos due to fda warnings. no sob, cp or h/o swelling. >>A1C 7.3 . Actos stopped and metformin two times a day rx     04/27/2011 f/u ov/Tywaun Hiltner cc increase cough and wheeze on Timolol requiring symbicort x 48 hours no purulent sputum fever cp overt sinus or reflux symptoms, cough worst first thing in am, abrupt in onset p "getting over a cold" rec Continue to use your med calendar we updated today. Prefer you use Betoptic exclusively  But will need to be monitored by your eye doctor For am cough, try taking pepcid 20 mg one at bedtime until it goes away for several weeks GERD diet reviewed   07/31/2011 f/u ov/Mikayla [name] all better from last flare  cc no sob no cough no overt symptoms of high or low sugars but continues to decline self testing. No need for saba daytime back on timolol ok rec Follow calendar, no change rx  10/30/2011 f/u ov/Mikayla [name] for yearly eval multiple chronic conditions: dm/ hyperlipidemia/asthma no new c/o's with no tia/claudication/ symptoms of high or low sugars, declines self testing. Not able to lose additional wt, no cough or limiting sob rec Follow calendar, no change in rx  01/31/2012 f/u ov/Mikayla [name] cc multiple chronic medical problems main c/o  L sacroiliac area pain s radiation aggravated by housework first noted x 2 days - no weakness or numbness or abn gait.  No change with urination or bm's. No cough or sob. >  06/04/2012 Follow up and Med review  Patient returns for a three-month followup and  medication review. We reviewed all her medications and organized them into a medication calendar with patient education. Patient had fasting labs done last week. Patient's cholesterol is not at goal. She reports that she has not been taking her simvastatin on a consistent basis. We discussed the dangers of hyperlipidemia, along with her underlying diabetes. Patient has agreed to restart simvastatin take it on a daily basis. She will return in 3 months with fasting labs. Hemoglobin A1c was elevated at 7.3 , to discuss better diet, and weight management planned. Patient will return in 3 months and have a repeat hemoglobin A1c if not improving. Need additional medications. Patient denies any chest pain, palpitations, orthopnea, PND, or leg swelling We discussed routine colonoscopies as in the past. Patient has refused on multiple occasions to have a screening colonoscopy. She declines cards. We also discussed routine mammograms, which she has declined having in the past as well. I have discussed benefit of colonoscopies and mammograms. However, she continues to decline. rec Low fat cholesterol diet.  Restart Zocor as discussed.  Keep sweets low.  Pneumovax today   09/23/2012 f/u ov/Mikayla [name] re dm/ hyperlipidemia Chief Complaint  Patient presents with  . Follow-up    Pt doing well and denies any new co's today.    no cp, tia or claudication, no symptoms of overt hyper or hypoglycemia.  Sleeping ok without nocturnal  or early am exacerbation  of respiratory  c/o's or need for noct saba. Also denies any obvious fluctuation of symptoms with weather or environmental changes or other aggravating or alleviating factors except as outlined above   Current Medications, Allergies, Past Medical History, Past Surgical History, Family History, and Social History were reviewed in Mikayla [name] record.  ROS  The following are not active complaints unless bolded sore throat, dysphagia, dental  problems, itching, sneezing,  nasal congestion or excess/ purulent secretions, ear ache,   fever, chills, sweats, unintended wt loss, pleuritic or exertional cp, hemoptysis,  orthopnea pnd or leg swelling, presyncope, palpitations, heartburn, abdominal pain, anorexia, nausea, vomiting, diarrhea  or change in bowel or urinary habits, change in stools or urine, dysuria,hematuria,  rash, arthralgias, visual complaints, headache, numbness weakness or ataxia or problems with walking or coordination,  change in mood/affect or memory.                   Allergies   1) ! Lipitor  2) ! * Drixoral   :  Past Medical History:  ESSENTIAL HYPERTENSION, BENIGN (ICD-401.1)  Glaucoma................................................................................[name]/[name]  HYPERLIPIDEMIA (ICD-272.4)  - target LDL less than 70 because of diabetes  -Zocor 20mg  every other day at bedtime April 14, 2010>>> LDL 110 July 06, 2010 and no recurrent  cramps  RHINITIS (ICD-472.0)  Intermittent asthma  - exac by Timolol but tolerates unless uri/flare  OSTEOPENIA (ICD-733.90)  - DEXA 12/28/07 PA Spine .655, L Hip -1.033, Lfem Neck -1.793  - DEXA 02/23/10 1.7 L Fem -1.9 Right Fem -1.8  OBESITY (ICD-278.00)  - Target wt = 148 for BMI < 30  AODM (ICD-250.00)  Health Maintenance.........................................................................Marland KitchenWert  - Td 03/2007 - Pneumovax 04/2013  - CPX 10/30/2011  - GYN Health Maint : [name], declines mammography and colonoscopy  --med calendar 06/04/2012                    Objective:   Physical Exam   wt 159 November 05, 2008 >162 April 13, 2010 > 159 July 06, 2010 > 151 10/11/2010 >150 12/01/2010 > 150 01/11/2011 > 04/27/2011  148> 148 07/31/2011 > 10/30/2011  148 > 01/31/2012  147 >143 06/04/2012 > 09/23/2012  149 HEENT: nl dentition, turbinates, and orophanx. Nl external ear canals without cough reflex  NECK : without JVD/Nodes/TM/ nl  carotid upstrokes bilaterally  LUNGS: no acc muscle, CTA  CV: RRR no s3 or murmur or increase in P2, no edema  ABD: soft and nontender with nl excursion in the supine position. No bruits or organomegaly, bowel sounds nl  MS: nl gait, warm without deformities, calf tenderness, cyanosis or clubbing,   Neuro: intact with no focal deficits noted - nl gait.  Skin: no lestions    Assessment:         Plan:

## 2012-09-23 NOTE — Patient Instructions (Addendum)
Please remember to go to the lab  department downstairs for your tests - we will call you with the results when they are available.  See calendar for specific medication instructions and bring it back for each and every office visit for every healthcare provider you see.  Without it,  you may not receive the best quality medical care that we feel you deserve.  You will note that the calendar groups together  your maintenance  medications that are timed at particular times of the day.  Think of this as your checklist for what your doctor has instructed you to do until your next evaluation to see what benefit  there is  to staying on a consistent group of medications intended to keep you well.  The other group at the bottom is entirely up to you to use as you see fit  for specific symptoms that may arise between visits that require you to treat them on an as needed basis.  Think of this as your action plan or "what if" list.   Separating the top medications from the bottom group is fundamental to providing you adequate care going forward.    Please schedule a follow up visit in 3 months but call sooner if needed - cpx on return

## 2012-09-24 ENCOUNTER — Telehealth: Payer: Self-pay | Admitting: Internal Medicine

## 2012-09-24 NOTE — Telephone Encounter (Signed)
Patient returning call.

## 2012-09-24 NOTE — Assessment & Plan Note (Signed)
Adequate control on present rx, reviewed     Each maintenance medication was reviewed in detail including most importantly the difference between maintenance and as needed and under what circumstances the prns are to be used. This was done in the context of a medication calendar review which provided the patient with a user-friendly unambiguous mechanism for medication administration and reconciliation and provides an action plan for all active problems. It is critical that this be shown to every doctor  for modification during the office visit if necessary so the patient can use it as a working document.     

## 2012-09-24 NOTE — Telephone Encounter (Signed)
Called and spoke with patient-- Made her aware of results as listed below Patient requesting copy mailed to home This has been placed upfront for outgoing mail Nothing further needed at this time   Result Note    Call patient : Studies are unremarkable, no change in recs - cholesterol not great so be sure still taking same rx of zocor and we'll revisit this issue in August 2014 cpx

## 2012-09-24 NOTE — Progress Notes (Signed)
Quick Note:  Called and spoke with patient-- Made her aware of results as listed below Patient requesting copy mailed to home This has been placed upfront for outgoing mail Nothing further needed at this time ______

## 2012-09-24 NOTE — Assessment & Plan Note (Signed)
-   Target LDL < 100 since has DM but poor statin tolerance   No Adequate control on present rx, reviewed options > working on wt loss, declined referral to lipid clinic

## 2012-09-24 NOTE — Telephone Encounter (Signed)
LMTCB This is in regards to her blood work results

## 2012-09-24 NOTE — Assessment & Plan Note (Signed)
Getting close to target wt for bmi < 30 but not down to ideal body wt to  Eliminate aodm and help control hyperlipidemia without rx, reviewed

## 2012-09-26 DIAGNOSIS — H409 Unspecified glaucoma: Secondary | ICD-10-CM | POA: Diagnosis not present

## 2012-09-26 DIAGNOSIS — H353190 Nonexudative age-related macular degeneration, unspecified eye, stage unspecified: Secondary | ICD-10-CM | POA: Diagnosis not present

## 2012-09-26 DIAGNOSIS — H4011X0 Primary open-angle glaucoma, stage unspecified: Secondary | ICD-10-CM | POA: Diagnosis not present

## 2012-11-19 DIAGNOSIS — H353290 Exudative age-related macular degeneration, unspecified eye, stage unspecified: Secondary | ICD-10-CM | POA: Diagnosis not present

## 2012-12-24 ENCOUNTER — Ambulatory Visit: Payer: Medicare Other | Admitting: Internal Medicine

## 2012-12-31 ENCOUNTER — Other Ambulatory Visit (INDEPENDENT_AMBULATORY_CARE_PROVIDER_SITE_OTHER): Payer: Medicare Other

## 2012-12-31 ENCOUNTER — Ambulatory Visit (INDEPENDENT_AMBULATORY_CARE_PROVIDER_SITE_OTHER)
Admission: RE | Admit: 2012-12-31 | Discharge: 2012-12-31 | Disposition: A | Discharge status: Home / Self Care | Payer: Medicare Other | Source: Ambulatory Visit | Attending: Internal Medicine | Admitting: Internal Medicine

## 2012-12-31 ENCOUNTER — Ambulatory Visit (INDEPENDENT_AMBULATORY_CARE_PROVIDER_SITE_OTHER): Payer: Medicare Other | Admitting: Internal Medicine

## 2012-12-31 ENCOUNTER — Encounter: Payer: Self-pay | Admitting: Internal Medicine

## 2012-12-31 VITALS — BP 138/74 | HR 67 | Temp 97.7°F | Ht 59.0 in | Wt 142.0 lb

## 2012-12-31 DIAGNOSIS — M899 Disorder of bone, unspecified: Secondary | ICD-10-CM

## 2012-12-31 DIAGNOSIS — E119 Type 2 diabetes mellitus without complications: Secondary | ICD-10-CM

## 2012-12-31 DIAGNOSIS — J45909 Unspecified asthma, uncomplicated: Secondary | ICD-10-CM

## 2012-12-31 DIAGNOSIS — R05 Cough: Secondary | ICD-10-CM | POA: Diagnosis not present

## 2012-12-31 DIAGNOSIS — E785 Hyperlipidemia, unspecified: Secondary | ICD-10-CM

## 2012-12-31 DIAGNOSIS — J310 Chronic rhinitis: Secondary | ICD-10-CM

## 2012-12-31 LAB — CBC WITH DIFFERENTIAL/PLATELET
Basophils Absolute: 0 10*3/uL (ref 0.0–0.1)
HCT: 36.2 % (ref 36.0–46.0)
Lymphs Abs: 1.8 10*3/uL (ref 0.7–4.0)
MCV: 88.2 fl (ref 78.0–100.0)
Monocytes Absolute: 0.5 10*3/uL (ref 0.1–1.0)
Neutrophils Relative %: 57.6 % (ref 43.0–77.0)
Platelets: 186 10*3/uL (ref 150.0–400.0)
RDW: 12.9 % (ref 11.5–14.6)

## 2012-12-31 LAB — MICROALBUMIN / CREATININE URINE RATIO
Creatinine,U: 55.7 mg/dL
Microalb, Ur: 1.5 mg/dL (ref 0.0–1.9)

## 2012-12-31 LAB — LIPID PANEL
Cholesterol: 219 mg/dL — ABNORMAL HIGH (ref 0–200)
HDL: 50.7 mg/dL (ref >39.00)
Total CHOL/HDL Ratio: 4
Triglycerides: 234 mg/dL — ABNORMAL HIGH (ref 0.0–149.0)
VLDL: 46.8 mg/dL — ABNORMAL HIGH (ref 0.0–40.0)

## 2012-12-31 LAB — HEPATIC FUNCTION PANEL: Total Bilirubin: 0.7 mg/dL (ref 0.3–1.2)

## 2012-12-31 LAB — HEMOGLOBIN A1C: Hgb A1c MFr Bld: 6.9 % — ABNORMAL HIGH (ref 4.6–6.5)

## 2012-12-31 NOTE — Patient Instructions (Addendum)
Please see patient coordinator before you leave today  to schedule bone density   Please remember to go to the lab and x-ray department downstairs for your tests - we will call you with the results when they are available.    Please schedule a follow up visit in 3 months but call sooner if needed  Late add : vit d level denied but not done since 2009

## 2012-12-31 NOTE — Assessment & Plan Note (Signed)
Adequate control on present rx, reviewed > no change in rx needed      Each maintenance medication was reviewed in detail including most importantly the difference between maintenance and as needed and under what circumstances the prns are to be used. This was done in the context of a medication calendar review which provided the patient with a user-friendly unambiguous mechanism for medication administration and reconciliation and provides an action plan for all active problems. It is critical that this be shown to every doctor  for modification during the office visit if necessary so the patient can use it as a working document.     

## 2012-12-31 NOTE — Assessment & Plan Note (Signed)
-   DEXA 12/28/07 PA Spine .655, L Hip -1.033, Lfem Neck -1.793  - DEXA 02/23/10 1.7 L Fem -1.9 Right Fem -1.8  - DEXA ordered  Reviewed Ca and Vit d recs

## 2012-12-31 NOTE — Progress Notes (Signed)
Subjective:     Patient ID: Mikayla [name], female   DOB: [birth year] .   MRN: [id number]  Brief patient profile:  34 yowf never smoker with moderate obesity> adult onset diabetes dx 2002 and hyperlipidemia.    HPI March 17, 2009 ov Acute visit. Pt c/o cough x 3 days. Cough is prod with yellow sputum. She also c/o wheezing. All started 10/29 with scratchy throat and nasal congestion. on timolol eye drops with no h/o asthma. rec change to betoptic > resolved.    06/04/2012 Follow up and Med review  Patient returns for a three-month followup and medication review. We reviewed all her medications and organized them into a medication calendar with patient education. Patient had fasting labs done last week. Patient's cholesterol is not at goal. She reports that she has not been taking her simvastatin on a consistent basis. We discussed the dangers of hyperlipidemia, along with her underlying diabetes. Patient has agreed to restart simvastatin take it on a daily basis. She will return in 3 months with fasting labs. Hemoglobin A1c was elevated at 7.3 , to discuss better diet, and weight management planned. Patient will return in 3 months and have a repeat hemoglobin A1c if not improving. Need additional medications. Patient denies any chest pain, palpitations, orthopnea, PND, or leg swelling We discussed routine colonoscopies as in the past. Patient has refused on multiple occasions to have a screening colonoscopy. She declines cards. We also discussed routine mammograms, which she has declined having in the past as well. I have discussed benefit of colonoscopies and mammograms. However, she continues to decline. rec Low fat cholesterol diet.  Restart Zocor as discussed.  Keep sweets low.  Pneumovax    12/31/2012 f/u ov/Trashaun Streight comprehensive dm/ asthma/hyperlipidemia Some sinus congestion in am, not using any prns Burning just bottom both Feet. No progression severity or pattern x sev years No  sob/ wheeze, not using saba at all  No cough   Sleeping ok without nocturnal  or early am exacerbation  of respiratory  c/o's or need for noct saba. Also denies any obvious fluctuation of symptoms with weather or environmental changes or other aggravating or alleviating factors except as outlined above   Current Medications, Allergies, Past Medical History, Past Surgical History, Family History, and Social History were reviewed in [employer] record.  ROS  The following are not active complaints unless bolded sore throat, dysphagia, dental problems, itching, sneezing,  nasal congestion or excess/ purulent secretions, ear ache,   fever, chills, sweats, unintended wt loss, pleuritic or exertional cp, hemoptysis,  orthopnea pnd or leg swelling, presyncope, palpitations, heartburn, abdominal pain, anorexia, nausea, vomiting, diarrhea  or change in bowel or urinary habits, change in stools or urine, dysuria,hematuria,  rash, arthralgias, visual complaints, headache, numbness weakness or ataxia or problems with walking or coordination,  change in mood/affect or memory.                    :  Past Medical History:  ESSENTIAL HYPERTENSION, BENIGN (ICD-401.1)  Glaucoma................................................................................[name]/[name]  HYPERLIPIDEMIA (ICD-272.4)  - target LDL less than 70 because of diabetes  -Zocor 20mg  every other day at bedtime April 14, 2010>>> LDL 110 July 06, 2010 and no recurrent  cramps  RHINITIS (ICD-472.0)  Intermittent asthma  - exac by Timolol but tolerates unless uri/flare  OSTEOPENIA (ICD-733.90)  - DEXA 12/28/07 PA Spine .655, L Hip -1.033, Lfem Neck -1.793  - DEXA 02/23/10 1.7 L Fem -1.9 Right Fem -1.8  OBESITY (ICD-278.00)  - Target wt = 148 for BMI < 30  AODM (ICD-250.00)  Health Maintenance.........................................................................Marland KitchenWert  - Td 03/2007 - Pneumovax  04/2013  - CPX 12/31/2012  - GYN Health Maint : [name], declines mammography and colonoscopy  --med calendar 06/04/2012                    Objective:   Physical Exam   wt 159 November 05, 2008 >162 April 13, 2010 > 159 July 06, 2010 > 151 10/11/2010 >150 12/01/2010 > 150 01/11/2011 > 04/27/2011  148> 148 07/31/2011 > 10/30/2011  148 > 01/31/2012  147 >143 06/04/2012 > 09/23/2012  149> 01/01/13  142  HEENT: nl dentition, turbinates, and orophanx. Nl external ear canals without cough reflex  NECK : without JVD/Nodes/TM/ nl carotid upstrokes bilaterally  LUNGS: no acc muscle, CTA  CV: RRR no s3 or murmur or increase in P2, no edema  ABD: soft and nontender with nl excursion in the supine position. No bruits or organomegaly, bowel sounds nl  MS: nl gait, warm without deformities, calf tenderness, cyanosis or clubbing,   Neuro: intact with no focal deficits noted - nl gait.  Skin: no lestions   CXR  12/31/2012 :  No edema or consolidation.        Assessment:

## 2012-12-31 NOTE — Assessment & Plan Note (Signed)
All goals of chronic asthma control met including optimal function and elimination of symptoms with minimal need for rescue therapy.  Contingencies discussed in full including contacting this office immediately if not controlling the symptoms using the rule of two's.    

## 2012-12-31 NOTE — Assessment & Plan Note (Addendum)
-   Target LDL < 100 since has DM but poor statin tolerance    Lab Results  Component Value Date   CHOL 219* 12/31/2012   HDL 50.70 12/31/2012   LDLCALC 110* 07/06/2010   LDLDIRECT 126.4 12/31/2012   TRIG 234.0* 12/31/2012   CHOLHDL 4 12/31/2012     tol statins poorly so no changes in zocor rx for now

## 2012-12-31 NOTE — Assessment & Plan Note (Addendum)
Lab Results  Component Value Date   HGBA1C 6.9* 12/31/2012     Adequate control on present rx, reviewed > no change in rx needed      Each maintenance medication was reviewed in detail including most importantly the difference between maintenance and as needed and under what circumstances the prns are to be used. This was done in the context of a medication calendar review which provided the patient with a user-friendly unambiguous mechanism for medication administration and reconciliation and provides an action plan for all active problems. It is critical that this be shown to every doctor  for modification during the office visit if necessary so the patient can use it as a working document.

## 2013-01-06 NOTE — Progress Notes (Signed)
Quick Note:  Spoke with pt and notified of results per Dr. [name]. Pt verbalized understanding and denied any questions.  ______ 

## 2013-01-07 ENCOUNTER — Inpatient Hospital Stay: Admission: RE | Admit: 2013-01-07 | Payer: Medicare Other | Source: Ambulatory Visit

## 2013-01-25 ENCOUNTER — Telehealth: Payer: Self-pay | Admitting: Pulmonary Disease

## 2013-01-25 MED ORDER — AZITHROMYCIN 250 MG PO TABS: ORAL_TABLET | ORAL | Status: DC

## 2013-01-25 NOTE — Telephone Encounter (Signed)
Pt c/o of runny nose and sore throat, and now chest congestion with cough productive of purulent mucus.  She has asthma hx, but is not more sob.  No fever.  She states she gets this 1-2 times a year and "Dr. Sherene [name] calls in abx". Will send in zpak.

## 2013-01-29 ENCOUNTER — Inpatient Hospital Stay: Admission: RE | Admit: 2013-01-29 | Payer: Medicare Other | Source: Ambulatory Visit

## 2013-01-30 ENCOUNTER — Telehealth: Payer: Self-pay | Admitting: Internal Medicine

## 2013-01-30 NOTE — Telephone Encounter (Signed)
[email or web address].Dua Mehler, CMA  

## 2013-01-31 MED ORDER — PREDNISONE 10 MG PO TABS: ORAL_TABLET | ORAL | Status: DC

## 2013-01-31 NOTE — Telephone Encounter (Signed)
Rx sent. Pt is aware. Dorean Daniello, CMA  

## 2013-01-31 NOTE — Telephone Encounter (Signed)
Spoke with patient-- states she is still coughing up yellow colored mucus. Was given Zpak on 9/13 by KC: Barbaraann Share, MD at 01/25/2013 11:15 AM   Status: Signed            Pt c/o of runny nose and sore throat, and now chest congestion with cough productive of purulent mucus. She has asthma hx, but is not more sob. No fever. She states she gets this 1-2 times a year and "Dr. Sherene Sires calls in abx".  Will send in zpak.    Patient completed this on Wednesday 9/17. Patient would like to know if she needs another round of abx to rid cough and mucus. Denies any F/C/S Dr. Sherene Sires please advise, thank you!!  Pharmacy: [facility]  Last OV: 12/31/12 w 3 month f/u not scheduled at this time  Allergies  Allergen Reactions  . Amoxicillin Rash  . Atorvastatin     REACTION: aches

## 2013-01-31 NOTE — Telephone Encounter (Signed)
Prednisone 10 mg take  4 each am x 2 days,   2 each am x 2 days,  1 each am x 2 days and stop  

## 2013-02-06 ENCOUNTER — Telehealth: Payer: Self-pay | Admitting: Internal Medicine

## 2013-02-06 NOTE — Telephone Encounter (Signed)
LMTCB-patient out at this time;daughter will have her call us back.

## 2013-02-06 NOTE — Telephone Encounter (Signed)
Pt returned call and can be reached @ [phone]. Mikayla [name]

## 2013-02-06 NOTE — Telephone Encounter (Signed)
I spoke with the pt and she states that she is better, mucus is clear but she is still coughing at night. I asked about the delsym that is on her med list ans she states this does help cough at night. I advised to continue this and give more time to improve since she just finished pred yesterday. Pt states understanding and will call if does not continue to improve. Carron Curie, CMA

## 2013-03-27 DIAGNOSIS — H353290 Exudative age-related macular degeneration, unspecified eye, stage unspecified: Secondary | ICD-10-CM | POA: Diagnosis not present

## 2013-03-27 DIAGNOSIS — H353190 Nonexudative age-related macular degeneration, unspecified eye, stage unspecified: Secondary | ICD-10-CM | POA: Diagnosis not present

## 2013-04-02 ENCOUNTER — Ambulatory Visit (INDEPENDENT_AMBULATORY_CARE_PROVIDER_SITE_OTHER): Payer: Medicare Other | Admitting: Internal Medicine

## 2013-04-02 ENCOUNTER — Encounter: Payer: Self-pay | Admitting: Internal Medicine

## 2013-04-02 ENCOUNTER — Other Ambulatory Visit (INDEPENDENT_AMBULATORY_CARE_PROVIDER_SITE_OTHER): Payer: Medicare Other

## 2013-04-02 VITALS — BP 144/78 | HR 63 | Temp 97.7°F | Ht 60.0 in | Wt 140.0 lb

## 2013-04-02 DIAGNOSIS — E119 Type 2 diabetes mellitus without complications: Secondary | ICD-10-CM

## 2013-04-02 DIAGNOSIS — Z23 Encounter for immunization: Secondary | ICD-10-CM

## 2013-04-02 DIAGNOSIS — J45909 Unspecified asthma, uncomplicated: Secondary | ICD-10-CM | POA: Diagnosis not present

## 2013-04-02 DIAGNOSIS — E785 Hyperlipidemia, unspecified: Secondary | ICD-10-CM

## 2013-04-02 LAB — LIPID PANEL
Cholesterol: 246 mg/dL — ABNORMAL HIGH (ref 0–200)
HDL: 52.9 mg/dL (ref >39.00)
Total CHOL/HDL Ratio: 5
Triglycerides: 212 mg/dL — ABNORMAL HIGH (ref 0.0–149.0)
VLDL: 42.4 mg/dL — ABNORMAL HIGH (ref 0.0–40.0)

## 2013-04-02 LAB — BASIC METABOLIC PANEL
BUN: 20 mg/dL (ref 6–23)
GFR: 116.33 mL/min (ref >60.00)
Potassium: 4.3 mEq/L (ref 3.5–5.1)
Sodium: 137 mEq/L (ref 135–145)

## 2013-04-02 LAB — HEPATIC FUNCTION PANEL
Albumin: 3.9 g/dL (ref 3.5–5.2)
Alkaline Phosphatase: 38 U/L — ABNORMAL LOW (ref 39–117)
Bilirubin, Direct: 0.1 mg/dL (ref 0.0–0.3)

## 2013-04-02 LAB — LDL CHOLESTEROL, DIRECT: Direct LDL: 160.9 mg/dL

## 2013-04-02 LAB — TSH: TSH: 3.47 u[IU]/mL (ref 0.35–5.50)

## 2013-04-02 LAB — HEMOGLOBIN A1C: Hgb A1c MFr Bld: 7.4 % — ABNORMAL HIGH (ref 4.6–6.5)

## 2013-04-02 NOTE — Patient Instructions (Addendum)
Please remember to go to the lab   department downstairs for your tests - we will call you with the results when they are available.  See calendar for specific medication instructions and bring it back for each and every office visit for every healthcare provider you see.  Without it,  you may not receive the best quality medical care that we feel you deserve.  You will note that the calendar groups together  your maintenance  medications that are timed at particular times of the day.  Think of this as your checklist for what your doctor has instructed you to do until your next evaluation to see what benefit  there is  to staying on a consistent group of medications intended to keep you well.  The other group at the bottom is entirely up to you to use as you see fit  for specific symptoms that may arise between visits that require you to treat them on an as needed basis.  Think of this as your action plan or "what if" list.   Separating the top medications from the bottom group is fundamental to providing you adequate care going forward.    Please schedule a follow up visit in 3 months but call sooner if needed  Late add:  Review statin rx/ lipids trending up ? rechallenge with higher dose or change to crestor?

## 2013-04-02 NOTE — Progress Notes (Signed)
Quick Note:  LMTCB ______ 

## 2013-04-03 NOTE — Assessment & Plan Note (Signed)
Flare in 01/2013, now Adequate control on present rx, reviewed > no change in rx needed

## 2013-04-03 NOTE — Progress Notes (Signed)
Subjective:     Patient ID: Mikayla [name], female   DOB: 06-26-1925 .   MRN: [id number]  Brief patient profile:  46 yowf never smoker with moderate obesity> adult onset diabetes dx 2002 and hyperlipidemia.    HPI March 17, 2009 ov Acute visit. Pt c/o cough x 3 days. Cough is prod with yellow sputum. She also c/o wheezing. All started 10/29 with scratchy throat and nasal congestion. on timolol eye drops with no h/o asthma. rec change to betoptic > resolved.   01/30/13 phone care for rhinitis/ asthma flare rx zpak and pred   04/01/2013 f/u ov/Joene Gelder re: am, intermittent asthma, hyperlipdemia Chief Complaint  Patient presents with  . Follow-up    Pt has no complaints at this time.     Stressed about husband in hospice. No flare of asthma. No over high or low sugar symptoms  Sleeping ok without nocturnal  or early am exacerbation  of respiratory  c/o's or need for noct saba. Also denies any obvious fluctuation of symptoms with weather or environmental changes or other aggravating or alleviating factors except as outlined above   Current Medications, Allergies, Past Medical History, Past Surgical History, Family History, and Social History were reviewed in [employer] record.  ROS  The following are not active complaints unless bolded sore throat, dysphagia, dental problems, itching, sneezing,  nasal congestion or excess/ purulent secretions, ear ache,   fever, chills, sweats, unintended wt loss, pleuritic or exertional cp, hemoptysis,  orthopnea pnd or leg swelling, presyncope, palpitations, heartburn, abdominal pain, anorexia, nausea, vomiting, diarrhea  or change in bowel or urinary habits, change in stools or urine, dysuria,hematuria,  rash, arthralgias, visual complaints, headache, numbness weakness or ataxia or problems with walking or coordination,  change in mood/affect or memory.                    :  Past Medical History:  ESSENTIAL HYPERTENSION,  BENIGN (ICD-401.1)  Glaucoma................................................................................[name]/[name]  HYPERLIPIDEMIA (ICD-272.4)  - target LDL less than 70 because of diabetes  -Zocor 20mg  every other day at bedtime April 14, 2010>>> LDL 110 July 06, 2010 and no recurrent  cramps  RHINITIS (ICD-472.0)  Intermittent asthma  - exac by Timolol but tolerates unless uri/flare  OSTEOPENIA (ICD-733.90)  - DEXA 12/28/07 PA Spine .655, L Hip -1.033, Lfem Neck -1.793  - DEXA 02/23/10 1.7 L Fem -1.9 Right Fem -1.8  OBESITY (ICD-278.00)  - Target wt = 148 for BMI < 30  AODM (ICD-250.00)  Health Maintenance.........................................................................Marland KitchenWert  - Td 03/2007 - Pneumovax 04/2013  - CPX 12/31/2012  - GYN Health Maint : [name], declines mammography and colonoscopy  --med calendar 06/04/2012                    Objective:   Physical Exam   wt 159 November 05, 2008 >162 April 13, 2010 > 159 July 06, 2010 > 151 10/11/2010 >150 12/01/2010 > 150 01/11/2011 > 04/27/2011  148> 148 07/31/2011 > 10/30/2011  148 > 01/31/2012  147 >143 06/04/2012 > 09/23/2012  149> 01/01/13  142 > 04/03/2013  140 HEENT: nl dentition, turbinates, and orophanx. Nl external ear canals without cough reflex  NECK : without JVD/Nodes/TM/ nl carotid upstrokes bilaterally  LUNGS: no acc muscle, CTA  CV: RRR no s3 or murmur or increase in P2, no edema  ABD: soft and nontender with nl excursion in the supine position. No bruits or organomegaly, bowel sounds nl  MS: nl gait,  warm without deformities, calf tenderness, cyanosis or clubbing,   Neuro: intact with no focal deficits noted - nl gait.  Skin: no lestions   CXR  12/31/2012 :  No edema or consolidation.        Assessment:

## 2013-04-03 NOTE — Progress Notes (Signed)
Quick Note:  LMTCB ______ 

## 2013-04-03 NOTE — Assessment & Plan Note (Signed)
Hemoglobin A1C  Date Value Range Status  04/02/2013 7.4* 4.6 - 6.5 % Final     Glycemic Control Guidelines for People with Diabetes:Non Diabetic:  <6%Goal of Therapy: <7%Additional Action Suggested:  >8%   12/31/2012 6.9* 4.6 - 6.5 % Final     Glycemic Control Guidelines for People with Diabetes:Non Diabetic:  <6%Goal of Therapy: <7%Additional Action Suggested:  >8%   09/23/2012 6.7* 4.6 - 6.5 % Final     Glycemic Control Guidelines for People with Diabetes:Non Diabetic:  <6%Goal of Therapy: <7%Additional Action Suggested:  >8%    Trending up with stress re husband and recent req for prednisone but Adequate control on present rx, reviewed > no change in rx needed

## 2013-04-03 NOTE — Assessment & Plan Note (Signed)
-   Target LDL < 100 since has DM but poor statin tolerance    Lab Results  Component Value Date   CHOL 246* 04/02/2013   HDL 52.90 04/02/2013   LDLCALC 110* 07/06/2010   LDLDIRECT 160.9 04/02/2013   TRIG 212.0* 04/02/2013   CHOLHDL 5 04/02/2013    Not Adequate control on present rx, reviewed > no change in rx due to poor tol of statins - diet/ ex only

## 2013-04-04 NOTE — Progress Notes (Signed)
Quick Note:  Spoke with pt and notified of results per Dr. Sherene Sires. Pt verbalized understanding and denied any questions. Reminder placed for 3 mo am appt with TP ______

## 2013-04-25 ENCOUNTER — Telehealth: Payer: Self-pay | Admitting: Internal Medicine

## 2013-04-25 MED ORDER — BUDESONIDE-FORMOTEROL FUMARATE 80-4.5 MCG/ACT IN AERO: 2.0000 | INHALATION_SPRAY | Freq: Two times a day (BID) | RESPIRATORY_TRACT | Status: DC | PRN

## 2013-04-25 NOTE — Telephone Encounter (Signed)
lmomtcb x1 rx sent 

## 2013-04-25 NOTE — Telephone Encounter (Signed)
Pt aware rx has been sent in.Mikayla [name]

## 2013-05-20 DIAGNOSIS — H353190 Nonexudative age-related macular degeneration, unspecified eye, stage unspecified: Secondary | ICD-10-CM | POA: Diagnosis not present

## 2013-05-20 DIAGNOSIS — H35059 Retinal neovascularization, unspecified, unspecified eye: Secondary | ICD-10-CM | POA: Diagnosis not present

## 2013-05-20 DIAGNOSIS — H4010X0 Unspecified open-angle glaucoma, stage unspecified: Secondary | ICD-10-CM | POA: Diagnosis not present

## 2013-06-26 ENCOUNTER — Other Ambulatory Visit: Payer: Self-pay | Admitting: Internal Medicine

## 2013-06-26 MED ORDER — METFORMIN HCL 500 MG PO TABS: 500.0000 mg | ORAL_TABLET | Freq: Two times a day (BID) | ORAL | Status: DC

## 2013-08-28 ENCOUNTER — Encounter: Payer: Self-pay | Admitting: Adult Health

## 2013-08-28 ENCOUNTER — Ambulatory Visit (INDEPENDENT_AMBULATORY_CARE_PROVIDER_SITE_OTHER): Payer: Medicare Other | Admitting: Adult Health

## 2013-08-28 ENCOUNTER — Other Ambulatory Visit (INDEPENDENT_AMBULATORY_CARE_PROVIDER_SITE_OTHER): Payer: Medicare Other

## 2013-08-28 VITALS — BP 140/84 | HR 61 | Ht 60.0 in | Wt 139.0 lb

## 2013-08-28 DIAGNOSIS — E119 Type 2 diabetes mellitus without complications: Secondary | ICD-10-CM | POA: Diagnosis not present

## 2013-08-28 DIAGNOSIS — J45909 Unspecified asthma, uncomplicated: Secondary | ICD-10-CM | POA: Diagnosis not present

## 2013-08-28 DIAGNOSIS — E785 Hyperlipidemia, unspecified: Secondary | ICD-10-CM | POA: Diagnosis not present

## 2013-08-28 LAB — BASIC METABOLIC PANEL
BUN: 24 mg/dL — AB (ref 6–23)
CO2: 30 meq/L (ref 19–32)
Calcium: 9.7 mg/dL (ref 8.4–10.5)
Chloride: 101 mEq/L (ref 96–112)
Creatinine, Ser: 0.6 mg/dL (ref 0.4–1.2)
GFR: 93.49 mL/min (ref >60.00)
GLUCOSE: 140 mg/dL — AB (ref 70–99)
POTASSIUM: 4.4 meq/L (ref 3.5–5.1)
SODIUM: 138 meq/L (ref 135–145)

## 2013-08-28 LAB — LIPID PANEL
CHOL/HDL RATIO: 4
Cholesterol: 235 mg/dL — ABNORMAL HIGH (ref 0–200)
HDL: 53.8 mg/dL (ref >39.00)
LDL Cholesterol: 148 mg/dL — ABNORMAL HIGH (ref 0–99)
Triglycerides: 167 mg/dL — ABNORMAL HIGH (ref 0.0–149.0)
VLDL: 33.4 mg/dL (ref 0.0–40.0)

## 2013-08-28 LAB — HEPATIC FUNCTION PANEL
ALT: 16 U/L (ref 0–35)
AST: 17 U/L (ref 0–37)
Albumin: 3.8 g/dL (ref 3.5–5.2)
Alkaline Phosphatase: 35 U/L — ABNORMAL LOW (ref 39–117)
BILIRUBIN DIRECT: 0.1 mg/dL (ref 0.0–0.3)
BILIRUBIN TOTAL: 0.9 mg/dL (ref 0.3–1.2)
Total Protein: 6.8 g/dL (ref 6.0–8.3)

## 2013-08-28 LAB — HEMOGLOBIN A1C: HEMOGLOBIN A1C: 6.9 % — AB (ref 4.6–6.5)

## 2013-08-28 MED ORDER — SIMVASTATIN 20 MG PO TABS: 20.0000 mg | ORAL_TABLET | Freq: Every day | ORAL | Status: DC

## 2013-08-28 NOTE — Progress Notes (Signed)
Subjective:     Patient ID: Mikayla [name], female   DOB: May 05, 1926 .   MRN: [id number]  Brief patient profile:  48 yowf never smoker with moderate obesity> adult onset diabetes dx 2002 and hyperlipidemia.   HPI March 17, 2009 ov Acute visit. Pt c/o cough x 3 days. Cough is prod with yellow sputum. She also c/o wheezing. All started 10/29 with scratchy throat and nasal congestion. on timolol eye drops with no h/o asthma. rec change to betoptic > resolved.   01/30/13 phone care for rhinitis/ asthma flare rx zpak and pred   04/01/2013 f/u ov/[name] re: am, intermittent asthma, hyperlipdemia Chief Complaint  Patient presents with  . Follow-up    Pt has no complaints at this time.    >>  08/28/2013 Follow up :Dyslipidemia/DM/intermittent asthma  Returns for 4 month follow up for chronic medical problems.  Patient remains on Zocor 20 mg daily. Last visit. Cholesterol was elevated. Patient is a. She was not taking her medications on a consistent basis. Over the last several weeks. She has been taking consistently, when she remembers per patient. Says that her asthma is been doing very well. She denies any cough, wheezing, or shortness, of breath. She did not have any flare ups over the winter months. She denies any albuterol use. Patient does not check blood sugars at home. Says that she is taking her metformin, more regularly. Has been cutting back on her sweets. A1c was slightly elevated. Last visit is 7.4 Patient denies any chest pain, orthopnea, PND, or leg swelling, Polyuria, or polydipsia.    Current Medications, Allergies, Past Medical History, Past Surgical History, Family History, and Social History were reviewed in [employer] record.  ROS  The following are not active complaints unless bolded sore throat, dysphagia, dental problems, itching, sneezing,  nasal congestion or excess/ purulent secretions, ear ache,   fever, chills, sweats, unintended wt loss,  pleuritic or exertional cp, hemoptysis,  orthopnea pnd or leg swelling, presyncope, palpitations, heartburn, abdominal pain, anorexia, nausea, vomiting, diarrhea  or change in bowel or urinary habits, change in stools or urine, dysuria,hematuria,  rash, arthralgias, visual complaints, headache, numbness weakness or ataxia or problems with walking or coordination,  change in mood/affect or memory.       :  Past Medical History:  ESSENTIAL HYPERTENSION, BENIGN (ICD-401.1)  Glaucoma................................................................................[name]/[name]  HYPERLIPIDEMIA (ICD-272.4)  - target LDL less than 70 because of diabetes  -Zocor 20mg  every other day at bedtime April 14, 2010>>> LDL 110 July 06, 2010 and no recurrent  cramps  RHINITIS (ICD-472.0)  Intermittent asthma  - exac by Timolol but tolerates unless uri/flare  OSTEOPENIA (ICD-733.90)  - DEXA 12/28/07 PA Spine .655, L Hip -1.033, Lfem Neck -1.793  - DEXA 02/23/10 1.7 L Fem -1.9 Right Fem -1.8  OBESITY (ICD-278.00)  - Target wt = 148 for BMI < 30  AODM (ICD-250.00)  Health Maintenance.........................................................................Marland KitchenWert  - Td 03/2007 - Pneumovax 04/2013  - CPX 12/31/2012  - GYN Health Maint : G[name], declines mammography and colonoscopy  --med calendar 06/04/2012 , 08/28/2013                    Objective:   Physical Exam   wt 159 November 05, 2008 >162 April 13, 2010 > 159 July 06, 2010 > 151 10/11/2010 >150 12/01/2010 > 150 01/11/2011 > 04/27/2011  148> 148 07/31/2011 > 10/30/2011  148 > 01/31/2012  147 >143 06/04/2012 > 09/23/2012  149> 01/01/13  142 >  04/03/2013  140 HEENT: nl dentition, turbinates, and orophanx. Nl external ear canals without cough reflex  NECK : without JVD/Nodes/TM/ nl carotid upstrokes bilaterally  LUNGS: no acc muscle, CTA  CV: RRR no s3 or murmur or increase in P2, no edema  ABD: soft and nontender with nl excursion  in the supine position. No bruits or organomegaly, bowel sounds nl  MS: nl gait, warm without deformities, calf tenderness, cyanosis or clubbing,   Neuro: intact with no focal deficits noted - nl gait.  Skin: no lestions   CXR  12/31/2012 :  No edema or consolidation.        Assessment:

## 2013-08-28 NOTE — Assessment & Plan Note (Signed)
Compensated without flare 

## 2013-08-28 NOTE — Assessment & Plan Note (Signed)
LDL goal less than 100. Patient advised on medication compliance, low with a low-fat, low-cholesterol diet. Plan labs today with fasting lipid panel

## 2013-08-28 NOTE — Assessment & Plan Note (Signed)
Advised on diabetic diet, keep, sweets, low Continue on current regimen. Labs today with A1c and bmet

## 2013-08-28 NOTE — Patient Instructions (Signed)
Low fat cholesterol diet.  Restart Zocor as discussed.  Keep sweets low.  Labs today   Follow up Dr. Melvyn Novas  In 6 months and As needed   Please contact office for sooner follow up if symptoms do not improve or worsen or seek emergency care

## 2013-10-02 DIAGNOSIS — Z961 Presence of intraocular lens: Secondary | ICD-10-CM | POA: Diagnosis not present

## 2013-10-02 DIAGNOSIS — H4020X0 Unspecified primary angle-closure glaucoma, stage unspecified: Secondary | ICD-10-CM | POA: Diagnosis not present

## 2013-10-02 DIAGNOSIS — H2640 Unspecified secondary cataract: Secondary | ICD-10-CM | POA: Diagnosis not present

## 2013-10-02 DIAGNOSIS — H353290 Exudative age-related macular degeneration, unspecified eye, stage unspecified: Secondary | ICD-10-CM | POA: Diagnosis not present

## 2013-11-06 DIAGNOSIS — H353190 Nonexudative age-related macular degeneration, unspecified eye, stage unspecified: Secondary | ICD-10-CM | POA: Diagnosis not present

## 2013-11-06 DIAGNOSIS — H4010X0 Unspecified open-angle glaucoma, stage unspecified: Secondary | ICD-10-CM | POA: Diagnosis not present

## 2013-11-06 DIAGNOSIS — H409 Unspecified glaucoma: Secondary | ICD-10-CM | POA: Diagnosis not present

## 2013-11-06 DIAGNOSIS — H35059 Retinal neovascularization, unspecified, unspecified eye: Secondary | ICD-10-CM | POA: Diagnosis not present

## 2013-11-10 DIAGNOSIS — T1510XA Foreign body in conjunctival sac, unspecified eye, initial encounter: Secondary | ICD-10-CM | POA: Diagnosis not present

## 2014-02-24 ENCOUNTER — Ambulatory Visit: Payer: Medicare Other | Admitting: Internal Medicine

## 2014-03-03 ENCOUNTER — Ambulatory Visit (INDEPENDENT_AMBULATORY_CARE_PROVIDER_SITE_OTHER): Payer: Medicare Other | Admitting: Internal Medicine

## 2014-03-03 ENCOUNTER — Other Ambulatory Visit (INDEPENDENT_AMBULATORY_CARE_PROVIDER_SITE_OTHER): Payer: Medicare Other

## 2014-03-03 ENCOUNTER — Encounter: Payer: Self-pay | Admitting: Internal Medicine

## 2014-03-03 ENCOUNTER — Ambulatory Visit (INDEPENDENT_AMBULATORY_CARE_PROVIDER_SITE_OTHER)
Admission: RE | Admit: 2014-03-03 | Discharge: 2014-03-03 | Disposition: A | Discharge status: Home / Self Care | Payer: Medicare Other | Source: Ambulatory Visit | Attending: Internal Medicine | Admitting: Internal Medicine

## 2014-03-03 VITALS — BP 228/100 | HR 62 | Temp 98.2°F | Ht 59.0 in | Wt 141.4 lb

## 2014-03-03 DIAGNOSIS — Z9189 Other specified personal risk factors, not elsewhere classified: Secondary | ICD-10-CM

## 2014-03-03 DIAGNOSIS — J4520 Mild intermittent asthma, uncomplicated: Secondary | ICD-10-CM

## 2014-03-03 DIAGNOSIS — E785 Hyperlipidemia, unspecified: Secondary | ICD-10-CM

## 2014-03-03 DIAGNOSIS — I10 Essential (primary) hypertension: Secondary | ICD-10-CM | POA: Diagnosis not present

## 2014-03-03 DIAGNOSIS — E669 Obesity, unspecified: Secondary | ICD-10-CM

## 2014-03-03 DIAGNOSIS — Z87898 Personal history of other specified conditions: Secondary | ICD-10-CM

## 2014-03-03 DIAGNOSIS — E119 Type 2 diabetes mellitus without complications: Secondary | ICD-10-CM

## 2014-03-03 DIAGNOSIS — E1169 Type 2 diabetes mellitus with other specified complication: Secondary | ICD-10-CM

## 2014-03-03 DIAGNOSIS — Z23 Encounter for immunization: Secondary | ICD-10-CM

## 2014-03-03 LAB — CBC WITH DIFFERENTIAL/PLATELET
BASOS ABS: 0 10*3/uL (ref 0.0–0.1)
Basophils Relative: 0.3 % (ref 0.0–3.0)
Eosinophils Absolute: 0.3 10*3/uL (ref 0.0–0.7)
Eosinophils Relative: 4.2 % (ref 0.0–5.0)
HEMATOCRIT: 36.4 % (ref 36.0–46.0)
Hemoglobin: 12.3 g/dL (ref 12.0–15.0)
LYMPHS ABS: 1.6 10*3/uL (ref 0.7–4.0)
Lymphocytes Relative: 26.3 % (ref 12.0–46.0)
MCHC: 33.8 g/dL (ref 30.0–36.0)
MCV: 87.7 fl (ref 78.0–100.0)
MONOS PCT: 7.7 % (ref 3.0–12.0)
Monocytes Absolute: 0.5 10*3/uL (ref 0.1–1.0)
NEUTROS PCT: 61.5 % (ref 43.0–77.0)
Neutro Abs: 3.7 10*3/uL (ref 1.4–7.7)
PLATELETS: 203 10*3/uL (ref 150.0–400.0)
RBC: 4.16 Mil/uL (ref 3.87–5.11)
RDW: 12.9 % (ref 11.5–15.5)
WBC: 6 10*3/uL (ref 4.0–10.5)

## 2014-03-03 LAB — HEPATIC FUNCTION PANEL
ALK PHOS: 41 U/L (ref 39–117)
ALT: 13 U/L (ref 0–35)
AST: 17 U/L (ref 0–37)
Albumin: 3.6 g/dL (ref 3.5–5.2)
BILIRUBIN DIRECT: 0.1 mg/dL (ref 0.0–0.3)
BILIRUBIN TOTAL: 0.8 mg/dL (ref 0.2–1.2)
Total Protein: 7.3 g/dL (ref 6.0–8.3)

## 2014-03-03 LAB — HEMOGLOBIN A1C: HEMOGLOBIN A1C: 7.4 % — AB (ref 4.6–6.5)

## 2014-03-03 LAB — LIPID PANEL
CHOL/HDL RATIO: 5
Cholesterol: 249 mg/dL — ABNORMAL HIGH (ref 0–200)
HDL: 55.2 mg/dL (ref >39.00)
NONHDL: 193.8
TRIGLYCERIDES: 220 mg/dL — AB (ref 0.0–149.0)
VLDL: 44 mg/dL — AB (ref 0.0–40.0)

## 2014-03-03 LAB — URINALYSIS, ROUTINE W REFLEX MICROSCOPIC
Bilirubin Urine: NEGATIVE
Hgb urine dipstick: NEGATIVE
Ketones, ur: NEGATIVE
Nitrite: NEGATIVE
Specific Gravity, Urine: 1.015
Total Protein, Urine: NEGATIVE
Urine Glucose: NEGATIVE
Urobilinogen, UA: 0.2
pH: 6 (ref 5.0–8.0)

## 2014-03-03 LAB — MICROALBUMIN / CREATININE URINE RATIO
Creatinine,U: 63 mg/dL
MICROALB/CREAT RATIO: 4.1 mg/g (ref 0.0–30.0)
Microalb, Ur: 2.6 mg/dL — ABNORMAL HIGH (ref 0.0–1.9)

## 2014-03-03 LAB — BASIC METABOLIC PANEL
BUN: 21 mg/dL (ref 6–23)
CALCIUM: 9.4 mg/dL (ref 8.4–10.5)
CO2: 30 mEq/L (ref 19–32)
Chloride: 101 mEq/L (ref 96–112)
Creatinine, Ser: 0.6 mg/dL (ref 0.4–1.2)
GFR: 93.38 mL/min (ref >60.00)
Glucose, Bld: 159 mg/dL — ABNORMAL HIGH (ref 70–99)
POTASSIUM: 4.1 meq/L (ref 3.5–5.1)
SODIUM: 138 meq/L (ref 135–145)

## 2014-03-03 LAB — TSH: TSH: 3.19 u[IU]/mL (ref 0.35–4.50)

## 2014-03-03 LAB — LDL CHOLESTEROL, DIRECT: LDL DIRECT: 149.1 mg/dL

## 2014-03-03 MED ORDER — LOSARTAN POTASSIUM-HCTZ 50-12.5 MG PO TABS: 1.0000 | ORAL_TABLET | Freq: Every day | ORAL | Status: DC

## 2014-03-03 NOTE — Progress Notes (Signed)
Quick Note:  Spoke with pt and notified of results per Dr. [name]. Pt verbalized understanding and denied any questions.  ______ 

## 2014-03-03 NOTE — Progress Notes (Addendum)
Subjective:     Patient ID: Mikayla [name], female   DOB: 12/24/1925 .   MRN: [id number]  Brief patient profile:  19 yowf never smoker with moderate obesity> adult onset diabetes dx 2002 and hyperlipidemia.   HPI March 17, 2009 ov Acute visit. Pt c/o cough x 3 days. Cough is prod with yellow sputum. She also c/o wheezing. All started 10/29 with scratchy throat and nasal congestion. on timolol eye drops with no h/o asthma. rec change to betoptic > resolved.   01/30/13 phone care for rhinitis/ asthma flare rx zpak and pred   04/01/2013 f/u ov/Christopher Glasscock re: am, intermittent asthma, hyperlipdemia Chief Complaint  Patient presents with  . Follow-up    Pt has no complaints at this time.    >>  08/28/2013 Follow up :Dyslipidemia/DM/intermittent asthma  Returns for 4 month follow up for chronic medical problems.  Patient remains on Zocor 20 mg daily. Last visit. Cholesterol was elevated. Patient is a. She was not taking her medications on a consistent basis. Over the last several weeks. She has been taking consistently, when she remembers per patient. Says that her asthma is been doing very well. She denies any cough, wheezing, or shortness, of breath. She did not have any flare ups over the winter months. She denies any albuterol use. Patient does not check blood sugars at home. Says that she is taking her metformin, more regularly. Has been cutting back on her sweets. A1c was slightly elevated. Last visit is 7.4 Patient denies any chest pain, orthopnea, PND, or leg swelling, Polyuria, or polydipsia rec Low fat cholesterol diet.  Restart Zocor as discussed.  Keep sweets low.    03/03/2014 f/u ov/Loyal Holzheimer re: hbp/ dm/ asthma Chief Complaint  Patient presents with  . Annual Exam    Pt is fasting. BP elevated today- pt relates to increased stress.    Not limited by breathing from desired activities, nor by cp. No tia or claudication  Declines monitoring sugars, doing any breast or gyn health  maint  . No   chronic cough or cp or chest tightness, subjective wheeze overt sinus or hb symptoms. No unusual exp hx or h/o childhood pna/ asthma or knowledge of premature birth.  Sleeping ok without nocturnal  or early am exacerbation  of respiratory  c/o's or need for noct saba. Also denies any obvious fluctuation of symptoms with weather or environmental changes or other aggravating or alleviating factors except as outlined above   Current Medications, Allergies, Complete Past Medical History, Past Surgical History, Family History, and Social History were reviewed in [employer] record.  ROS  The following are not active complaints unless bolded sore throat, dysphagia, dental problems, itching, sneezing,  nasal congestion or excess/ purulent secretions, ear ache,   fever, chills, sweats, unintended wt loss, pleuritic or exertional cp, hemoptysis,  orthopnea pnd or leg swelling, presyncope, palpitations, heartburn, abdominal pain, anorexia, nausea, vomiting, diarrhea  or change in bowel or urinary habits, change in stools or urine, dysuria,hematuria,  rash, arthralgias, visual complaints, headache, numbness weakness or ataxia or problems with walking or coordination,  change in mood/affect or memory.               :  Past Medical History:  ESSENTIAL HYPERTENSION, BENIGN (ICD-401.1)  Glaucoma................................................................................[name]/[name]  HYPERLIPIDEMIA (ICD-272.4)  - target LDL less than 70 because of diabetes  -Zocor 20mg  every other day at bedtime April 14, 2010>>> LDL 110 July 06, 2010 and no recurrent  cramps  RHINITIS (  ICD-472.0)  Intermittent asthma  - exac by Timolol but tolerates unless uri/flare  OSTEOPENIA (ICD-733.90)  - DEXA 12/28/07 PA Spine .655, L Hip -1.033, Lfem Neck -1.793  - DEXA 02/23/10 1.7 L Fem -1.9 Right Fem -1.8  OBESITY (ICD-278.00)  - Target wt = 148 for BMI < 30  AODM  (ICD-250.00)  Health Maintenance.........................................................................Marland KitchenWert  - Td 03/2007 - Pneumovax 04/2013 . prevnar 03/03/14 - CPX 03/03/2014  - GYN Health Maint : G[name], declines mammography and colonoscopy  --med calendar 06/04/2012 , 08/28/2013                    Objective:   Physical Exam   wt 159 November 05, 2008 >162 April 13, 2010 > 159 July 06, 2010 > 151 10/11/2010 >150 12/01/2010 > 150 01/11/2011 > 04/27/2011  148> 148 07/31/2011 > 10/30/2011  148 > 01/31/2012  147 >143 06/04/2012 > 09/23/2012  149> 01/01/13  142 > 04/03/2013  140> 03/03/2014 141   HEENT: nl dentition, turbinates, and orophanx. Nl external ear canals without cough reflex  NECK : without JVD/Nodes/TM/ nl carotid upstrokes bilaterally  LUNGS: no acc muscle, CTA  CV: RRR no s3 or murmur or increase in P2, no edema  ABD: soft and nontender with nl excursion in the supine position. No bruits or organomegaly, bowel sounds nl  MS: nl gait, warm without deformities, calf tenderness, cyanosis or clubbing,   Neuro: intact with no focal deficits noted - nl gait.  Skin: no lestions Neuro:  Alert/ no motor or cerebellar def/ nl gait      CXR  03/03/2014 : No active cardiopulmonary disease.       Recent Labs Lab 03/03/14 1040  NA 138  K 4.1  CL 101  CO2 30  BUN 21  CREATININE 0.6  GLUCOSE 159*    Recent Labs Lab 03/03/14 1040  HGB 12.3  HCT 36.4  WBC 6.0  PLT 203.0     Lab Results  Component Value Date   TSH 3.19 03/03/2014      Assessment:

## 2014-03-03 NOTE — Patient Instructions (Addendum)
prevnar and flu shot today  Start losartan/hctz 50/12/5 one daily   Please remember to go to the lab and x-ray department downstairs for your tests - we will call you with the results when they are available.    See [name] w/in 2 weeks  To recheck blood pressure and kidney function on your new medication.

## 2014-03-04 ENCOUNTER — Telehealth: Payer: Self-pay | Admitting: Internal Medicine

## 2014-03-04 NOTE — Assessment & Plan Note (Signed)
-   DEXA 12/28/07 PA Spine .655, L Hip -1.033, Lfem Neck -1.793  - DEXA 02/23/10 1.7 L Fem -1.9 Right Fem -1.8  - DEXA ordered 12/31/2012 > did not go   rec reconsider / added low dose thiazide should help with calcium loss

## 2014-03-04 NOTE — Assessment & Plan Note (Signed)
Adequate control on present rx, reviewed > no change in rx needed   

## 2014-03-04 NOTE — Assessment & Plan Note (Addendum)
-   Target LDL < 100 since has DM but poor statin tolerance  Lab Results  Component Value Date   CHOL 249* 03/03/2014   HDL 55.20 03/03/2014   LDLCALC 148* 08/28/2013   LDLDIRECT 149.1 03/03/2014   TRIG 220.0* 03/03/2014   CHOLHDL 5 03/03/2014      Admits not consistent with zocor though on her checklist formatted med calendar, reinforced

## 2014-03-04 NOTE — Progress Notes (Signed)
Quick Note:  Spoke with pt and notified of results per Dr. [name]. Pt verbalized understanding and denied any questions.  ______ 

## 2014-03-04 NOTE — Assessment & Plan Note (Signed)
Lab Results  Component Value Date   HGBA1C 7.4* 03/03/2014   HGBA1C 6.9* 08/28/2013   HGBA1C 7.4* 04/02/2013       Trending up with stress - may need higher dose glucophage

## 2014-03-04 NOTE — Telephone Encounter (Signed)
ATC line busy x 3. According to lab result note, Mikayla [name] has already spoken with pt regarding results.  WCB

## 2014-03-04 NOTE — Assessment & Plan Note (Signed)
prob related to stress though note pulse relatively low so not good for BB here  rec hyzar trial and f/u in 2 weeks

## 2014-03-05 ENCOUNTER — Telehealth: Payer: Self-pay | Admitting: Internal Medicine

## 2014-03-05 NOTE — Telephone Encounter (Signed)
Called and spoke to pt. Informed pt of the results and recs per MW. Pt requested a copy be mailed to her home. Address verified. Labs placed in outgoing mail. Pt verbalized understanding and denied any further questions or concerns at this time.   Notes Recorded by Rosana Berger, CMA on 03/04/2014 at 12:37 PM Spoke with pt and notified of results per Dr. Melvyn Novas. Pt verbalized understanding and denied any questions.  Notes Recorded by Tanda Rockers, MD on 03/04/2014 at 8:17 AM Call patient: results are unremarkable, no change in Recs

## 2014-03-05 NOTE — Telephone Encounter (Signed)
lmtcb X1 to relay recs.  

## 2014-03-05 NOTE — Telephone Encounter (Signed)
This is a very mild med and should be taken one daily but if symptoms from it ok then to break in half with pill cutter

## 2014-03-05 NOTE — Telephone Encounter (Signed)
Spoke with pt, she is wondering if she can take only half a tablet of Losartan at a time since she is just starting on a bp medication and is afraid of the side effects it might have.  States that she was told by her pharmacist that it is not a scored pill, and is worried that she might not get the full benefit of the medication if she takes only half. Her blood pressure yesterday was measured by her husband's hospice nurse, yesterday was 180/? (pt unsure of diastolic number) and today was 164/?.    Dr. Melvyn Novas please advise.  Thank you.

## 2014-03-06 NOTE — Telephone Encounter (Signed)
Spoke with pt- aware of rec per MW Nothing further needed.

## 2014-03-17 ENCOUNTER — Ambulatory Visit (INDEPENDENT_AMBULATORY_CARE_PROVIDER_SITE_OTHER): Payer: Medicare Other | Admitting: Adult Health

## 2014-03-17 ENCOUNTER — Encounter: Payer: Self-pay | Admitting: Adult Health

## 2014-03-17 ENCOUNTER — Other Ambulatory Visit (INDEPENDENT_AMBULATORY_CARE_PROVIDER_SITE_OTHER): Payer: Medicare Other

## 2014-03-17 VITALS — BP 140/86 | HR 71 | Temp 97.1°F | Ht 59.0 in | Wt 145.0 lb

## 2014-03-17 DIAGNOSIS — I10 Essential (primary) hypertension: Secondary | ICD-10-CM

## 2014-03-17 DIAGNOSIS — M545 Low back pain, unspecified: Secondary | ICD-10-CM

## 2014-03-17 LAB — BASIC METABOLIC PANEL
BUN: 29 mg/dL — ABNORMAL HIGH (ref 6–23)
CALCIUM: 9.5 mg/dL (ref 8.4–10.5)
CHLORIDE: 101 meq/L (ref 96–112)
CO2: 23 mEq/L (ref 19–32)
Creatinine, Ser: 0.9 mg/dL (ref 0.4–1.2)
GFR: 59.92 mL/min — ABNORMAL LOW (ref >60.00)
Glucose, Bld: 109 mg/dL — ABNORMAL HIGH (ref 70–99)
Potassium: 5 mEq/L (ref 3.5–5.1)
Sodium: 137 mEq/L (ref 135–145)

## 2014-03-17 MED ORDER — TRAMADOL HCL 50 MG PO TABS: 50.0000 mg | ORAL_TABLET | Freq: Three times a day (TID) | ORAL | Status: DC | PRN

## 2014-03-17 NOTE — Assessment & Plan Note (Signed)
Improved control on Cozaar.  Advised on NSAID use  Low salt diet   Plan  Please stop advil /Ibuprofen. Avoid NSAIDS -aleve,    Continue Losartan/HCTZ daily  Low salt diet.  Follow up Dr. Melvyn Novas  In 3 months and As needed   Labs today.

## 2014-03-17 NOTE — Patient Instructions (Signed)
Please stop advil /Ibuprofen. Avoid NSAIDS -aleve,  May use Tylenol As needed  Pain .  May take Tramadol 50mg  1 every 8hr As needed  Pain , may make you sleepy.  If hip if not improving will need to be referred to Orthopedics.  Continue Losartan/HCTZ daily  Low salt diet.  Follow up Dr. Melvyn Novas  In 3 months and As needed   Labs today.

## 2014-03-17 NOTE — Assessment & Plan Note (Signed)
Low back pain /DJD  Advised to use ice and heat  Ortho referral if not improving   Plan  Please stop advil /Ibuprofen. Avoid NSAIDS -aleve,  May use Tylenol As needed  Pain .  May take Tramadol 50mg  1 every 8hr As needed  Pain , may make you sleepy.  If hip if not improving will need to be referred to Orthopedics.   Follow up Dr. Melvyn Novas  In 3 months and As needed   .

## 2014-03-17 NOTE — Progress Notes (Signed)
Subjective:     Patient ID: Mikayla [name], female   DOB: [birth year] .   MRN: [id number]  Brief patient profile:  54 yowf never smoker with moderate obesity> adult onset diabetes dx 2002 and hyperlipidemia.   HPI March 17, 2009 ov Acute visit. Pt c/o cough x 3 days. Cough is prod with yellow sputum. She also c/o wheezing. All started 10/29 with scratchy throat and nasal congestion. on timolol eye drops with no h/o asthma. rec change to betoptic > resolved.   01/30/13 phone care for rhinitis/ asthma flare rx zpak and pred   04/01/2013 f/u ov/[name] re: am, intermittent asthma, hyperlipdemia Chief Complaint  Patient presents with  . Follow-up    Pt has no complaints at this time.    >>  08/28/2013 Follow up :Dyslipidemia/DM/intermittent asthma  Returns for 4 month follow up for chronic medical problems.  Patient remains on Zocor 20 mg daily. Last visit. Cholesterol was elevated. Patient is a. She was not taking her medications on a consistent basis. Over the last several weeks. She has been taking consistently, when she remembers per patient. Says that her asthma is been doing very well. She denies any cough, wheezing, or shortness, of breath. She did not have any flare ups over the winter months. She denies any albuterol use. Patient does not check blood sugars at home. Says that she is taking her metformin, more regularly. Has been cutting back on her sweets. A1c was slightly elevated. Last visit is 7.4 Patient denies any chest pain, orthopnea, PND, or leg swelling, Polyuria, or polydipsia rec Low fat cholesterol diet.  Restart Zocor as discussed.  Keep sweets low.    03/03/2014 f/u ov/[name] re: hbp/ dm/ asthma Chief Complaint  Patient presents with  . Annual Exam    Pt is fasting. BP elevated today- pt relates to increased stress.    Not limited by breathing from desired activities, nor by cp. No tia or claudication Declines monitoring sugars, doing any breast or gyn health  maint  >rx cozaar   03/17/2014 Follow up HTN  Pt returns for 2 week follow up for HTN B/p was elevated at last ov, she was started on cozaar.  On return today , b/p has improved -near goal .  She says she is tolerating ok. No headache, visual or speech changes. No chest pain or edema Does complain that hip and back hurt, she takes motrin several times a day. No leg weakness or radicular symptoms.  We discussed alternatives for NSAIDS due to elevated b/p.    Current Medications, Allergies, Complete Past Medical History, Past Surgical History, Family History, and Social History were reviewed in [employer] record.  ROS  The following are not active complaints unless bolded sore throat, dysphagia, dental problems, itching, sneezing,  nasal congestion or excess/ purulent secretions, ear ache,   fever, chills, sweats, unintended wt loss, pleuritic or exertional cp, hemoptysis,  orthopnea pnd or leg swelling, presyncope, palpitations, heartburn, abdominal pain, anorexia, nausea, vomiting, diarrhea  or change in bowel or urinary habits, change in stools or urine, dysuria,hematuria,  rash,   visual complaints, headache, numbness weakness or ataxia or problems with walking or coordination,  change in mood/affect or memory.               :  Past Medical History:  ESSENTIAL HYPERTENSION, BENIGN (ICD-401.1)  Glaucoma................................................................................[name]/[name]  HYPERLIPIDEMIA (ICD-272.4)  - target LDL less than 70 because of diabetes  -Zocor 20mg  every other day at bedtime  April 14, 2010>>> LDL 110 July 06, 2010 and no recurrent  cramps  RHINITIS (ICD-472.0)  Intermittent asthma  - exac by Timolol but tolerates unless uri/flare  OSTEOPENIA (ICD-733.90)  - DEXA 12/28/07 PA Spine .655, L Hip -1.033, Lfem Neck -1.793  - DEXA 02/23/10 1.7 L Fem -1.9 Right Fem -1.8  OBESITY (ICD-278.00)  - Target wt = 148 for BMI <  30  AODM (ICD-250.00)  Health Maintenance.........................................................................Marland KitchenWert  - Td 03/2007 - Pneumovax 04/2013 . prevnar 03/03/14 - CPX 03/03/2014  - GYN Health Maint : [name], declines mammography and colonoscopy  --med calendar 06/04/2012 , 08/28/2013                    Objective:   Physical Exam   wt 159 November 05, 2008 >162 April 13, 2010 > 159 July 06, 2010 > 151 10/11/2010 >150 12/01/2010 > 150 01/11/2011 > 04/27/2011  148> 148 07/31/2011 > 10/30/2011  148 > 01/31/2012  147 >143 06/04/2012 > 09/23/2012  149> 01/01/13  142 > 04/03/2013  140> 03/03/2014 141 >145 03/17/2014   HEENT: nl dentition, turbinates, and orophanx. Nl external ear canals without cough reflex  NECK : without JVD/Nodes/TM/ nl carotid upstrokes bilaterally  LUNGS: no acc muscle, CTA  CV: RRR no s3 or murmur or increase in P2, no edema  ABD: soft and nontender with nl excursion in the supine position. No bruits or organomegaly, bowel sounds nl  MS: nl gait, warm without deformities, calf tenderness, cyanosis or clubbing,  Neg SLR  Neuro: intact with no focal deficits noted - nl gait.  Skin: no lestions Neuro:  Alert/ no motor or cerebellar def/ nl gait      CXR  03/03/2014 : No active cardiopulmonary disease.       Recent Labs Lab 03/03/14 1040  NA 138  K 4.1  CL 101  CO2 30  BUN 21  CREATININE 0.6  GLUCOSE 159*    Recent Labs Lab 03/03/14 1040  HGB 12.3  HCT 36.4  WBC 6.0  PLT 203.0     Lab Results  Component Value Date   TSH 3.19 03/03/2014      Assessment:

## 2014-03-20 NOTE — Progress Notes (Signed)
Quick Note:  Called spoke with patient, advised of lab results / recs as stated by TP. Pt verbalized her understanding and denied any questions. ______ 

## 2014-03-26 DIAGNOSIS — H4011X2 Primary open-angle glaucoma, moderate stage: Secondary | ICD-10-CM | POA: Diagnosis not present

## 2014-04-20 ENCOUNTER — Other Ambulatory Visit (INDEPENDENT_AMBULATORY_CARE_PROVIDER_SITE_OTHER): Payer: Medicare Other

## 2014-04-20 ENCOUNTER — Ambulatory Visit (INDEPENDENT_AMBULATORY_CARE_PROVIDER_SITE_OTHER): Payer: Medicare Other | Admitting: Adult Health

## 2014-04-20 ENCOUNTER — Encounter: Payer: Self-pay | Admitting: Adult Health

## 2014-04-20 VITALS — BP 140/70 | HR 77 | Temp 97.7°F | Ht 59.0 in | Wt 142.2 lb

## 2014-04-20 DIAGNOSIS — R42 Dizziness and giddiness: Secondary | ICD-10-CM

## 2014-04-20 DIAGNOSIS — I10 Essential (primary) hypertension: Secondary | ICD-10-CM | POA: Diagnosis not present

## 2014-04-20 LAB — BASIC METABOLIC PANEL
BUN: 24 mg/dL — AB (ref 6–23)
CO2: 29 mEq/L (ref 19–32)
Calcium: 9.3 mg/dL (ref 8.4–10.5)
Chloride: 100 mEq/L (ref 96–112)
Creatinine, Ser: 0.9 mg/dL (ref 0.4–1.2)
GFR: 61.41 mL/min (ref >60.00)
Glucose, Bld: 103 mg/dL — ABNORMAL HIGH (ref 70–99)
POTASSIUM: 4.2 meq/L (ref 3.5–5.1)
Sodium: 137 mEq/L (ref 135–145)

## 2014-04-20 MED ORDER — LOSARTAN POTASSIUM 25 MG PO TABS: 25.0000 mg | ORAL_TABLET | Freq: Every day | ORAL | Status: DC

## 2014-04-20 MED ORDER — MECLIZINE HCL 12.5 MG PO TABS: 6.2500 mg | ORAL_TABLET | Freq: Two times a day (BID) | ORAL | Status: DC | PRN

## 2014-04-20 NOTE — Progress Notes (Signed)
Subjective:     Patient ID: Mikayla [name], female   DOB: 06-May-1926 .   MRN: [id number]  Brief patient profile:  35 yowf never smoker with moderate obesity> adult onset diabetes dx 2002 and hyperlipidemia.   HPI March 17, 2009 ov Acute visit. Pt c/o cough x 3 days. Cough is prod with yellow sputum. She also c/o wheezing. All started 10/29 with scratchy throat and nasal congestion. on timolol eye drops with no h/o asthma. rec change to betoptic > resolved.   01/30/13 phone care for rhinitis/ asthma flare rx zpak and pred   04/01/2013 f/u ov/[name] re: am, intermittent asthma, hyperlipdemia Chief Complaint  Patient presents with  . Follow-up    Pt has no complaints at this time.    >>  08/28/2013 Follow up :Dyslipidemia/DM/intermittent asthma  Returns for 4 month follow up for chronic medical problems.  Patient remains on Zocor 20 mg daily. Last visit. Cholesterol was elevated. Patient is a. She was not taking her medications on a consistent basis. Over the last several weeks. She has been taking consistently, when she remembers per patient. Says that her asthma is been doing very well. She denies any cough, wheezing, or shortness, of breath. She did not have any flare ups over the winter months. She denies any albuterol use. Patient does not check blood sugars at home. Says that she is taking her metformin, more regularly. Has been cutting back on her sweets. A1c was slightly elevated. Last visit is 7.4 Patient denies any chest pain, orthopnea, PND, or leg swelling, Polyuria, or polydipsia rec Low fat cholesterol diet.  Restart Zocor as discussed.  Keep sweets low.    03/03/2014 f/u ov/[name] re: hbp/ dm/ asthma Chief Complaint  Patient presents with  . Annual Exam    Pt is fasting. BP elevated today- pt relates to increased stress.    Not limited by breathing from desired activities, nor by cp. No tia or claudication Declines monitoring sugars, doing any breast or gyn health  maint  >rx cozaar   03/17/2014 Follow up HTN  Pt returns for 2 week follow up for HTN B/p was elevated at last ov, she was started on cozaar.  On return today , b/p has improved -near goal .  She says she is tolerating ok. No headache, visual or speech changes. No chest pain or edema Does complain that hip and back hurt, she takes motrin several times a day. No leg weakness or radicular symptoms.  We discussed alternatives for NSAIDS due to elevated b/p.  >>  04/20/2014 Acute OV  Feels like losartan is making her dizzy and nauseated, very lethargic. Feels like she does not have any energy. For the last few weeks. Gets lightheaded with standing . Has nausea on/off .  No vomiting or diarrhea.  She denies any headache, visual changes, chest pain, palpitations, presyncope, syncope, extremity weakness or speech changes. She did not take her losartan today and has been feeling better.  Current Medications, Allergies, Complete Past Medical History, Past Surgical History, Family History, and Social History were reviewed in [employer] record.  ROS  The following are not active complaints unless bolded sore throat, dysphagia, dental problems, itching, sneezing,  nasal congestion or excess/ purulent secretions, ear ache,   fever, chills, sweats, unintended wt loss, pleuritic or exertional cp, hemoptysis,  orthopnea pnd or leg swelling, presyncope, palpitations, heartburn, abdominal pain, anorexia, nausea, vomiting, diarrhea  or change in bowel or urinary habits, change in stools or  urine, dysuria,hematuria,  rash,   visual complaints, headache, numbness weakness or ataxia or problems with walking or coordination,  change in mood/affect or memory.               :  Past Medical History:  ESSENTIAL HYPERTENSION, BENIGN (ICD-401.1)  Glaucoma................................................................................[name]/[name]  HYPERLIPIDEMIA (ICD-272.4)  - target  LDL less than 70 because of diabetes  -Zocor 20mg  every other day at bedtime April 14, 2010>>> LDL 110 July 06, 2010 and no recurrent  cramps  RHINITIS (ICD-472.0)  Intermittent asthma  - exac by Timolol but tolerates unless uri/flare  OSTEOPENIA (ICD-733.90)  - DEXA 12/28/07 PA Spine .655, L Hip -1.033, Lfem Neck -1.793  - DEXA 02/23/10 1.7 L Fem -1.9 Right Fem -1.8  OBESITY (ICD-278.00)  - Target wt = 148 for BMI < 30  AODM (ICD-250.00)  Health Maintenance.........................................................................Marland KitchenWert  - Td 03/2007 - Pneumovax 04/2013 . prevnar 03/03/14 - CPX 03/03/2014  - GYN Health Maint : G[name], declines mammography and colonoscopy  --med calendar 06/04/2012 , 08/28/2013                    Objective:   Physical Exam   wt 159 November 05, 2008 >162 April 13, 2010 > 159 July 06, 2010 > 151 10/11/2010 >150 12/01/2010 > 150 01/11/2011 > 04/27/2011  148> 148 07/31/2011 > 10/30/2011  148 > 01/31/2012  147 >143 06/04/2012 > 09/23/2012  149> 01/01/13  142 > 04/03/2013  140> 03/03/2014 141 >145 03/17/2014 >>142 04/20/2014   HEENT: nl dentition, turbinates, and orophanx. Nl external ear canals without cough reflex  NECK : without JVD/Nodes/TM/ nl carotid upstrokes bilaterally  LUNGS: no acc muscle, CTA  CV: RRR no s3 or murmur or increase in P2, no edema  ABD: soft and nontender with nl excursion in the supine position. No bruits or organomegaly, bowel sounds nl  MS: nl gait, warm without deformities, calf tenderness, cyanosis or clubbing,  Neg SLR  Neuro: intact with no focal deficits noted - nl gait.  Skin: no lestions Neuro:  Alert/ no motor or cerebellar def/ nl gait  Cranial nerves II through XII are intact Neg pronator drift. nml grips.  Equal ext strength.     CXR  03/03/2014 : No active cardiopulmonary disease.       Recent Labs Lab 03/03/14 1040  NA 138  K 4.1  CL 101  CO2 30  BUN 21  CREATININE 0.6  GLUCOSE 159*     Recent Labs Lab 03/03/14 1040  HGB 12.3  HCT 36.4  WBC 6.0  PLT 203.0     Lab Results  Component Value Date   TSH 3.19 03/03/2014      Assessment:

## 2014-04-20 NOTE — Assessment & Plan Note (Signed)
HTN ? Medication intolerance vs Vertigo  Check labs   Plan  Stop Losartan /Hctz  Change to Losartan 25mg  daily  Check blood pressure daily , keep log, call if >150 Low salt diet  Change positions slowly.  Meclizine 12.5mg  1/2 - 1 Twice daily  As needed  Dizziness.  Follow up Dr. Melvyn Novas  In 6-8 weeks as planned .  Please contact office for sooner follow up if symptoms do not improve or worsen or seek emergency care

## 2014-04-20 NOTE — Patient Instructions (Addendum)
Stop Losartan /Hctz  Change to Losartan 25mg  daily  Check blood pressure daily , keep log, call if >150 Low salt diet  Change positions slowly.  Meclizine 12.5mg  1/2 - 1 Twice daily  As needed  Dizziness.  Follow up Dr. Melvyn Novas  In 6-8 weeks as planned .  Please contact office for sooner follow up if symptoms do not improve or worsen or seek emergency care

## 2014-04-22 NOTE — Progress Notes (Signed)
Quick Note:  Patient notified of lab results. Nothing further needed. ______

## 2014-04-29 ENCOUNTER — Telehealth: Payer: Self-pay

## 2014-04-29 ENCOUNTER — Encounter (HOSPITAL_COMMUNITY)
Admission: EM | Disposition: A | Discharge status: Home / Self Care | Payer: Self-pay | Source: Home / Self Care | Attending: Emergency Medicine

## 2014-04-29 ENCOUNTER — Emergency Department (HOSPITAL_COMMUNITY)
Admission: EM | Admit: 2014-04-29 | Discharge: 2014-04-29 | Disposition: A | Discharge status: Home / Self Care | Payer: Medicare Other | Attending: Emergency Medicine | Admitting: Emergency Medicine

## 2014-04-29 ENCOUNTER — Encounter (HOSPITAL_COMMUNITY): Payer: Self-pay | Admitting: Emergency Medicine

## 2014-04-29 ENCOUNTER — Ambulatory Visit (HOSPITAL_COMMUNITY): Admit: 2014-04-29 | Payer: Self-pay | Admitting: Gastroenterology

## 2014-04-29 DIAGNOSIS — Y9389 Activity, other specified: Secondary | ICD-10-CM | POA: Diagnosis not present

## 2014-04-29 DIAGNOSIS — I10 Essential (primary) hypertension: Secondary | ICD-10-CM | POA: Insufficient documentation

## 2014-04-29 DIAGNOSIS — T18128A Food in esophagus causing other injury, initial encounter: Secondary | ICD-10-CM | POA: Insufficient documentation

## 2014-04-29 DIAGNOSIS — J310 Chronic rhinitis: Secondary | ICD-10-CM | POA: Insufficient documentation

## 2014-04-29 DIAGNOSIS — E669 Obesity, unspecified: Secondary | ICD-10-CM | POA: Insufficient documentation

## 2014-04-29 DIAGNOSIS — E785 Hyperlipidemia, unspecified: Secondary | ICD-10-CM | POA: Insufficient documentation

## 2014-04-29 DIAGNOSIS — Z791 Long term (current) use of non-steroidal anti-inflammatories (NSAID): Secondary | ICD-10-CM | POA: Diagnosis not present

## 2014-04-29 DIAGNOSIS — Z7982 Long term (current) use of aspirin: Secondary | ICD-10-CM | POA: Diagnosis not present

## 2014-04-29 DIAGNOSIS — E119 Type 2 diabetes mellitus without complications: Secondary | ICD-10-CM | POA: Diagnosis not present

## 2014-04-29 DIAGNOSIS — X58XXXA Exposure to other specified factors, initial encounter: Secondary | ICD-10-CM | POA: Diagnosis not present

## 2014-04-29 DIAGNOSIS — Y999 Unspecified external cause status: Secondary | ICD-10-CM | POA: Insufficient documentation

## 2014-04-29 DIAGNOSIS — Z79899 Other long term (current) drug therapy: Secondary | ICD-10-CM | POA: Diagnosis not present

## 2014-04-29 DIAGNOSIS — Y929 Unspecified place or not applicable: Secondary | ICD-10-CM | POA: Diagnosis not present

## 2014-04-29 HISTORY — PX: ESOPHAGOGASTRODUODENOSCOPY: SHX5428

## 2014-04-29 LAB — GLUCOSE, CAPILLARY: Glucose-Capillary: 117 mg/dL — ABNORMAL HIGH (ref 70–99)

## 2014-04-29 SURGERY — EGD (ESOPHAGOGASTRODUODENOSCOPY)
Anesthesia: Moderate Sedation

## 2014-04-29 MED ORDER — DIPHENHYDRAMINE HCL 50 MG/ML IJ SOLN
INTRAMUSCULAR | Status: AC
  Filled 2014-04-29: qty 1

## 2014-04-29 MED ORDER — ESOMEPRAZOLE MAGNESIUM 40 MG PO PACK: 40.0000 mg | PACK | Freq: Two times a day (BID) | ORAL | Status: DC

## 2014-04-29 MED ORDER — FENTANYL CITRATE 0.05 MG/ML IJ SOLN
INTRAMUSCULAR | Status: AC
  Filled 2014-04-29: qty 2

## 2014-04-29 MED ORDER — MIDAZOLAM HCL 10 MG/2ML IJ SOLN
INTRAMUSCULAR | Status: DC | PRN
  Administered 2014-04-29 (×2): 1 mg via INTRAVENOUS
  Administered 2014-04-29 (×2): 2 mg via INTRAVENOUS

## 2014-04-29 MED ORDER — FENTANYL CITRATE 0.05 MG/ML IJ SOLN
INTRAMUSCULAR | Status: DC | PRN
  Administered 2014-04-29: 12.5 ug via INTRAVENOUS
  Administered 2014-04-29 (×2): 25 ug via INTRAVENOUS
  Administered 2014-04-29: 12.5 ug via INTRAVENOUS

## 2014-04-29 MED ORDER — MIDAZOLAM HCL 10 MG/2ML IJ SOLN
INTRAMUSCULAR | Status: AC
  Filled 2014-04-29: qty 2

## 2014-04-29 MED ORDER — BUTAMBEN-TETRACAINE-BENZOCAINE 2-2-14 % EX AERO
INHALATION_SPRAY | CUTANEOUS | Status: DC | PRN
  Administered 2014-04-29: 1 via TOPICAL

## 2014-04-29 NOTE — H&P (View-Only) (Signed)
   Brief note.  Patient is an 80 year old female who presented to ED today with complaints of fish being stuck in esophagus.  Patient has intermittent dysphagia, especially when under stress but food has always passed without intervention. This time patient had to extract the fish but is still unable to swallow saliva. Patient takes a daily asa but no other blood thinning products. She is hypertensive but vitals otherwise stable. Will proceed with EGD with possible extraction of food. The benefits, risks, and potential complications of EGD with possible biopsies were discussed with the patient and she agrees to proceed.    I agree, proceeding with EGD now.

## 2014-04-29 NOTE — Interval H&P Note (Signed)
History and Physical Interval Note:  04/29/2014 3:44 PM  Mikayla [name]  has presented today for surgery, with the diagnosis of food impaction  The various methods of treatment have been discussed with the patient and family. After consideration of risks, benefits and other options for treatment, the patient has consented to  Procedure(s): ESOPHAGOGASTRODUODENOSCOPY (EGD) (N/A) as a surgical intervention .  The patient's history has been reviewed, patient examined, no change in status, stable for surgery.  I have reviewed the patient's chart and labs.  Questions were answered to the patient's satisfaction.     Milus Banister

## 2014-04-29 NOTE — ED Notes (Signed)
Pt transported to endoscopy 

## 2014-04-29 NOTE — Progress Notes (Signed)
   Brief note.  Patient is an 80 year old female who presented to ED today with complaints of fish being stuck in esophagus.  Patient has intermittent dysphagia, especially when under stress but food has always passed without intervention. This time patient had to extract the fish but is still unable to swallow saliva. Patient takes a daily asa but no other blood thinning products. She is hypertensive but vitals otherwise stable. Will proceed with EGD with possible extraction of food. The benefits, risks, and potential complications of EGD with possible biopsies were discussed with the patient and she agrees to proceed.    I agree, proceeding with EGD now.

## 2014-04-29 NOTE — ED Notes (Signed)
Pt states that she was eating baked fish and rice and then had something get stuck in her throat last night.  States that this has happened several times before but it normally goes down by now. Pt concerned because she cannot keep any water down.  It comes right back up. Pt also hypertensive because she has not been able to take any of her medication.

## 2014-04-29 NOTE — ED Provider Notes (Signed)
CSN: [id number]     Arrival date & time 04/29/14  1256 History   First MD Initiated Contact with Patient 04/29/14 1351     Chief Complaint  Patient presents with  . Food in throat      (Consider location/radiation/quality/duration/timing/severity/associated sxs/prior Treatment) HPI Comments: Patient here complaining of esophageal food impaction since yesterday. States that she was eating fish and since that time cannot control her secretions. States she is able to swallow water but that it comes back up. Denies abdominal pain. Denies any prior history of esophageal strictures. Has used antacids and soda without relief.  The history is provided by the patient.    Past Medical History  Diagnosis Date  . Essential hypertension, benign   . Other and unspecified hyperlipidemia   . Chronic rhinitis   . Disorder of bone and cartilage, unspecified   . Obesity, unspecified   . Type II or unspecified type diabetes mellitus without mention of complication, not stated as uncontrolled    Past Surgical History  Procedure Laterality Date  . Total abdominal hysterectomy     Family History  Problem Relation Age of Onset  . COPD Mother   . Lung cancer Father   . Prostate cancer     History  Substance Use Topics  . Smoking status: Never Smoker   . Smokeless tobacco: Never Used  . Alcohol Use: Yes     Comment: wine with dinner   OB History    No data available     Review of Systems  All other systems reviewed and are negative.     Allergies  Amoxicillin and Atorvastatin  Home Medications   Prior to Admission medications   Medication Sig Start Date End Date Taking? Authorizing Provider  aspirin 325 MG tablet Take 325 mg by mouth daily.      Historical Provider, MD  budesonide-formoterol (SYMBICORT) 80-4.5 MCG/ACT inhaler Inhale 2 puffs into the lungs 2 (two) times daily as needed (shortness of breath/wheezing).    Historical Provider, MD  calcium carbonate (TUMS EX) 750 MG  chewable tablet Chew 1 tablet by mouth 3 (three) times daily with meals.     Historical Provider, MD  Cholecalciferol (VITAMIN D) 1000 UNITS capsule Take 1,000 Units by mouth daily.      Historical Provider, MD  dextromethorphan (DELSYM) 30 MG/5ML liquid 2 tsp by mouth every 12 hours as needed for cough    Historical Provider, MD  famotidine (PEPCID) 20 MG tablet Take 20 mg by mouth at bedtime as needed for heartburn.    Historical Provider, MD  Homeopathic Products ([employer] LEG CRAMPS PAIN RELIEF PO) Take one at bedtime as needed    Historical Provider, MD  ibuprofen (ADVIL,MOTRIN) 200 MG tablet 2-3 with meals two times a day as needed     Historical Provider, MD  loratadine (CLARITIN) 10 MG tablet Take 10 mg by mouth daily as needed.     Historical Provider, MD  losartan (COZAAR) 25 MG tablet Take 1 tablet (25 mg total) by mouth daily. 04/20/14   Tammy S Parrett, NP  meclizine (ANTIVERT) 12.5 MG tablet Take 0.5 tablets (6.25 mg total) by mouth 2 (two) times daily as needed for dizziness. 04/20/14   Melvenia Needles, NP  metFORMIN (GLUCOPHAGE) 500 MG tablet Take 1 tablet (500 mg total) by mouth 2 (two) times daily with a meal. 06/26/13 06/26/14  Tanda Rockers, MD  Multiple Vitamins-Minerals (CENTRUM SILVER PO) Take 1 tablet by mouth daily.  Historical Provider, MD  Multiple Vitamins-Minerals (ICAPS AREDS FORMULA PO) Take 1 capsule by mouth daily.    Historical Provider, MD  Pseudoephedrine-Ibuprofen 30-200 MG TABS Per bottle as needed for stuffy head/nasal discharge    Historical Provider, MD  simvastatin (ZOCOR) 20 MG tablet Take 1 tablet (20 mg total) by mouth at bedtime. 08/28/13   Tammy S Parrett, NP  timolol (BETIMOL) 0.5 % ophthalmic solution Place 1 drop into both eyes at bedtime.     Historical Provider, MD  traMADol (ULTRAM) 50 MG tablet Take 1 tablet (50 mg total) by mouth every 8 (eight) hours as needed. 03/17/14   Tammy S Parrett, NP   BP 183/111 mmHg  Pulse 87  Temp(Src) 98.1 F (36.7 C)  (Oral)  Resp 18  SpO2 100% Physical Exam  Constitutional: She is oriented to person, place, and time. She appears well-developed and well-nourished.  Non-toxic appearance. No distress.  HENT:  Head: Normocephalic and atraumatic.  Eyes: Conjunctivae, EOM and lids are normal. Pupils are equal, round, and reactive to light.  Neck: Normal range of motion. Neck supple. No tracheal deviation present. No thyroid mass present.  Cardiovascular: Normal rate, regular rhythm and normal heart sounds.  Exam reveals no gallop.   No murmur heard. Pulmonary/Chest: Effort normal and breath sounds normal. No stridor. No respiratory distress. She has no decreased breath sounds. She has no wheezes. She has no rhonchi. She has no rales.  Abdominal: Soft. Normal appearance and bowel sounds are normal. She exhibits no distension. There is no tenderness. There is no rebound and no CVA tenderness.  Musculoskeletal: Normal range of motion. She exhibits no edema or tenderness.  Neurological: She is alert and oriented to person, place, and time. She has normal strength. No cranial nerve deficit or sensory deficit. GCS eye subscore is 4. GCS verbal subscore is 5. GCS motor subscore is 6.  Skin: Skin is warm and dry. No abrasion and no rash noted.  Psychiatric: She has a normal mood and affect. Her speech is normal and behavior is normal.  Nursing note and vitals reviewed.   ED Course  Procedures (including critical care time) Labs Review Labs Reviewed - No data to display  Imaging Review No results found.   EKG Interpretation None      MDM   Final diagnoses:  None   Patient to be seen by gastroenterology    Leota Jacobsen, MD 04/29/14 1410

## 2014-04-29 NOTE — Telephone Encounter (Signed)
-----   Message from Milus Banister, MD sent at 04/29/2014  4:27 PM EST ----- She needs EGD at [facility] with me in 4-5 weeks for balloon dilation.  Thanks

## 2014-04-29 NOTE — Op Note (Signed)
[facility] [place], [id number]   ENDOSCOPY PROCEDURE REPORT  PATIENT: Mikayla, [name]  MR#: [id number] BIRTHDATE: 01-01-1926 , 88  yrs. old GENDER: female ENDOSCOPIST: Milus Banister, MD PROCEDURE DATE:  04/29/2014 PROCEDURE:  EGD w/ fb removal ASA CLASS:     Class III INDICATIONS:  food impaction in esophagus, dysphagia. MEDICATIONS: Fentanyl 50 mcg IV and Versed 6 mg IV TOPICAL ANESTHETIC: none  DESCRIPTION OF PROCEDURE: After the risks benefits and alternatives of the procedure were thoroughly explained, informed consent was obtained.  The Pentax Gastroscope [id number] endoscope was introduced through the mouth and advanced to the second portion of the duodenum , Without limitations.  The instrument was slowly withdrawn as the mucosa was fully examined.    There was a large food impaction just above the GE junction, this was whitish fish, meat.  Using a combination of the talon grasper and Roth net, some of the bolus was removed from her esophagus and the remaining was fragmented enough to allow it to be gently pushed into the stomach.  The GE junction was very inflammed, with a focal peptic stricture vs.  inflammed Schatzki's ring at the site.  No clear maligancy noted.  The UGI tract was otherwise normal. Retroflexed views revealed no abnormalities.     The scope was then withdrawn from the patient and the procedure completed.  COMPLICATIONS: There were no immediate complications.  ENDOSCOPIC IMPRESSION: There was a large food impaction just above the GE junction, this was whitish fish, meat.  Using a combination of the talon grasper and Roth net, some of the bolus was removed from her esophagus and the remaining was fragmented enough to allow it to be gently pushed into the stomach.  The GE junction was very inflammed, with a focal peptic stricture vs.  inflammed Schatzki's ring at the site.  No clear maligancy noted.  The UGI tract  was otherwise normal  RECOMMENDATIONS: You should start twice daily PPI.  A new prescription was called into your pharmacy today.  Take one pill 20-30 min prior to BF and dinner meals.  Chew your food well, eat slowly and take small bites.  Dr.  Ardis Hughs' office will contact you about scheduling repeat EGD with dilation in 3-4 weeks.   eSigned:  Milus Banister, MD 04/29/2014 4:25 PM     PATIENT NAME:  Mikayla, [name] MR#: [id number]

## 2014-04-29 NOTE — Discharge Instructions (Addendum)
Gastrointestinal Endoscopy, Care After °Refer to this sheet in the next few weeks. These instructions provide you with information on caring for yourself after your procedure. Your caregiver may also give you more specific instructions. Your treatment has been planned according to current medical practices, but problems sometimes occur. Call your caregiver if you have any problems or questions after your procedure. °HOME CARE INSTRUCTIONS °· If you were given medicine to help you relax (sedative), do not drive, operate machinery, or sign important documents for 24 hours. °· Avoid alcohol and hot or warm beverages for the first 24 hours after the procedure. °· Only take over-the-counter or prescription medicines for pain, discomfort, or fever as directed by your caregiver. You may resume taking your normal medicines unless your caregiver tells you otherwise. Ask your caregiver when you may resume taking medicines that may cause bleeding, such as aspirin, clopidogrel, or warfarin. °· You may return to your normal diet and activities on the day after your procedure, or as directed by your caregiver. Walking may help to reduce any bloated feeling in your abdomen. °· Drink enough fluids to keep your urine clear or pale yellow. °· You may gargle with salt water if you have a sore throat. °SEEK IMMEDIATE MEDICAL CARE IF: °· You have severe nausea or vomiting. °· You have severe abdominal pain, abdominal cramps that last longer than 6 hours, or abdominal swelling (distention). °· You have severe shoulder or back pain. °· You have trouble swallowing. °· You have shortness of breath, your breathing is shallow, or you are breathing faster than normal. °· You have a fever or a rapid heartbeat. °· You vomit blood or material that looks like coffee grounds. °· You have bloody, black, or tarry stools. °MAKE SURE YOU: °· Understand these instructions. °· Will watch your condition. °· Will get help right away if you are not doing  well or get worse. °Document Released: 12/14/2003 Document Revised: 09/15/2013 Document Reviewed: 08/01/2011 °ExitCare® Patient Information ©2015 [employer]. This information is not intended to replace advice given to you by your health care provider. Make sure you discuss any questions you have with your health care provider. ° °

## 2014-04-30 ENCOUNTER — Encounter (HOSPITAL_COMMUNITY): Payer: Self-pay | Admitting: Gastroenterology

## 2014-04-30 ENCOUNTER — Telehealth: Payer: Self-pay | Admitting: Adult Health

## 2014-04-30 ENCOUNTER — Telehealth: Payer: Self-pay

## 2014-04-30 NOTE — Telephone Encounter (Signed)
Needs to have b/p check again to make sure it is better since this was done during stressfull event  Needs ov if b/p remains >150/100  Please contact office for sooner follow up if symptoms do not improve or worsen or seek emergency care

## 2014-04-30 NOTE — Telephone Encounter (Signed)
Left message on machine to call back  

## 2014-04-30 NOTE — Telephone Encounter (Signed)
Called and spoke to pt. Informed pt of the recs per TP. Pt is requesting to speak with TP herself. Pt stated she can be reached anytime at [phone].  TP please advise.
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
## 2014-04-30 NOTE — Telephone Encounter (Signed)
Called pt. She reports she had endo done yesterday and BP was 200/111. Pt reports she did not take her losartan 25 mg bc she was not able to swallow yesterday, She did take the medication today but has not checked BP. Pt has not checked BP prior to yesterday either but reports she knows her BP has been high. She wants to know if she should still continue losartan 25 mg QD or increase to BID. Please advise TP thanks

## 2014-04-30 NOTE — Telephone Encounter (Signed)
-----   Message from Milus Banister, MD sent at 04/29/2014  4:27 PM EST ----- She needs EGD at [facility] with me in 4-5 weeks for balloon dilation.  Thanks

## 2014-05-01 ENCOUNTER — Telehealth: Payer: Self-pay | Admitting: Gastroenterology

## 2014-05-01 NOTE — Telephone Encounter (Signed)
Pt has been scheduled for previsit and endo.

## 2014-05-01 NOTE — Telephone Encounter (Signed)
See alternate note  

## 2014-05-07 ENCOUNTER — Telehealth: Payer: Self-pay | Admitting: Adult Health

## 2014-05-07 MED ORDER — LOSARTAN POTASSIUM 25 MG PO TABS: 25.0000 mg | ORAL_TABLET | Freq: Two times a day (BID) | ORAL | Status: DC

## 2014-05-07 NOTE — Telephone Encounter (Signed)
That's fine but make sure she has f/u to regroup p holidays with [name] to re write med calendar and new rx for cozar if working ok

## 2014-05-07 NOTE — Telephone Encounter (Signed)
Yes,  Losartan 25 bid until she sees [name]

## 2014-05-07 NOTE — Telephone Encounter (Signed)
MW to clarify---  You want her to increase the losartan 25 mg to 1 tablet BID and have an appt with TP after the holidays?  thanks

## 2014-05-07 NOTE — Telephone Encounter (Signed)
Called and spoke with pt and she is aware of MW recs.  appt has been scheduled for the pt to see TP on 1/28 for med cal.  Nothing further is needed and pt is aware that new rx has been sent to her pharmacy.

## 2014-05-07 NOTE — Telephone Encounter (Signed)
Called and spoke with pt and she stated that her BP has been elevated.  The 180/86 was yesterday when the nurse came in to take her BP but she stated that it has been staying around these numbers recently.  She wanted to see if she needed to increase her BP meds since she is only taking 25 mg once daily.  MW please advise. Thanks  Allergies  Allergen Reactions  . Amoxicillin Rash  . Atorvastatin     REACTION: aches    Current Outpatient Prescriptions on File Prior to Visit  Medication Sig Dispense Refill  . aspirin 325 MG tablet Take 325 mg by mouth daily.      . budesonide-formoterol (SYMBICORT) 80-4.5 MCG/ACT inhaler Inhale 2 puffs into the lungs 2 (two) times daily as needed (shortness of breath/wheezing).    . calcium carbonate (TUMS EX) 750 MG chewable tablet Chew 1 tablet by mouth 3 (three) times daily with meals.     . Cholecalciferol (VITAMIN D) 1000 UNITS capsule Take 1,000 Units by mouth daily.      Marland Kitchen dextromethorphan (DELSYM) 30 MG/5ML liquid 2 tsp by mouth every 12 hours as needed for cough    . esomeprazole (NEXIUM) 40 MG packet Take 40 mg by mouth 2 (two) times daily before a meal. 60 each 12  . famotidine (PEPCID) 20 MG tablet Take 20 mg by mouth at bedtime as needed for heartburn.    . Homeopathic Products ([employer] LEG CRAMPS PAIN RELIEF PO) Take one at bedtime as needed    . ibuprofen (ADVIL,MOTRIN) 200 MG tablet 2-3 with meals two times a day as needed     . loratadine (CLARITIN) 10 MG tablet Take 10 mg by mouth daily as needed.     Marland Kitchen losartan (COZAAR) 25 MG tablet Take 1 tablet (25 mg total) by mouth daily. 30 tablet 5  . meclizine (ANTIVERT) 12.5 MG tablet Take 0.5 tablets (6.25 mg total) by mouth 2 (two) times daily as needed for dizziness. 20 tablet 0  . metFORMIN (GLUCOPHAGE) 500 MG tablet Take 1 tablet (500 mg total) by mouth 2 (two) times daily with a meal. 180 tablet 2  . Multiple Vitamins-Minerals (CENTRUM SILVER PO) Take 1 tablet by mouth daily.      . Multiple  Vitamins-Minerals (ICAPS AREDS FORMULA PO) Take 1 capsule by mouth daily.    . Pseudoephedrine-Ibuprofen 30-200 MG TABS Per bottle as needed for stuffy head/nasal discharge    . simvastatin (ZOCOR) 20 MG tablet Take 1 tablet (20 mg total) by mouth at bedtime. 30 tablet 11  . timolol (BETIMOL) 0.5 % ophthalmic solution Place 1 drop into both eyes at bedtime.     . traMADol (ULTRAM) 50 MG tablet Take 1 tablet (50 mg total) by mouth every 8 (eight) hours as needed. 30 tablet 0   No current facility-administered medications on file prior to visit.

## 2014-05-11 ENCOUNTER — Ambulatory Visit (AMBULATORY_SURGERY_CENTER): Payer: Self-pay | Admitting: *Deleted

## 2014-05-11 ENCOUNTER — Telehealth: Payer: Self-pay | Admitting: Internal Medicine

## 2014-05-11 ENCOUNTER — Ambulatory Visit (INDEPENDENT_AMBULATORY_CARE_PROVIDER_SITE_OTHER): Payer: Medicare Other | Admitting: Internal Medicine

## 2014-05-11 ENCOUNTER — Encounter: Payer: Self-pay | Admitting: Internal Medicine

## 2014-05-11 VITALS — BP 180/80 | HR 70 | Ht 59.0 in | Wt 139.0 lb

## 2014-05-11 VITALS — Ht 59.0 in | Wt 139.2 lb

## 2014-05-11 DIAGNOSIS — E785 Hyperlipidemia, unspecified: Secondary | ICD-10-CM | POA: Diagnosis not present

## 2014-05-11 DIAGNOSIS — J4520 Mild intermittent asthma, uncomplicated: Secondary | ICD-10-CM

## 2014-05-11 DIAGNOSIS — E669 Obesity, unspecified: Secondary | ICD-10-CM | POA: Diagnosis not present

## 2014-05-11 DIAGNOSIS — I10 Essential (primary) hypertension: Secondary | ICD-10-CM

## 2014-05-11 DIAGNOSIS — E1169 Type 2 diabetes mellitus with other specified complication: Secondary | ICD-10-CM | POA: Diagnosis not present

## 2014-05-11 DIAGNOSIS — R1314 Dysphagia, pharyngoesophageal phase: Secondary | ICD-10-CM

## 2014-05-11 DIAGNOSIS — E119 Type 2 diabetes mellitus without complications: Secondary | ICD-10-CM

## 2014-05-11 NOTE — Telephone Encounter (Signed)
Spoke with pt, states she wants MW to know what her bp is running since changing her losartin to 25 mg bid on 12/24.  Pt has an appt with GI at 3:30 and does not want to be late for that.  She has been scheduled with MW for 4:00.  Nothing further needed at this time.

## 2014-05-11 NOTE — Patient Instructions (Signed)
  See [name] w/in 2 weeks with all your medications, even over the counter meds, separated in two separate bags, the ones you take no matter what vs the ones you stop once you feel better and take only as needed when you feel you need them.   Tammy  will generate for you a new user friendly medication calendar that will put us all on the same page re: your medication use.     Without this process, it simply isn't possible to assure that we are providing  your outpatient care  with  the attention to detail we feel you deserve.   If we cannot assure that you're getting that kind of care,  then we cannot manage your problem effectively from this clinic.  Once you have seen Tammy and we are sure that we're all on the same page with your medication use she will arrange follow up with me.  

## 2014-05-11 NOTE — Progress Notes (Signed)
Subjective:     Patient ID: Mikayla [name], female   DOB: [birth year] .   MRN: [id number]  Brief patient profile:  68 yowf never smoker with moderate obesity> adult onset diabetes dx 2002 and hyperlipidemia and asthma  HPI March 17, 2009 ov Acute visit. Pt c/o cough x 3 days. Cough is prod with yellow sputum. She also c/o wheezing. All started 10/29 with scratchy throat and nasal congestion. on timolol eye drops with no h/o asthma. rec change to betoptic > resolved.    03/17/2014 Follow up HTN  Pt returns for 2 week follow up for HTN B/p was elevated at last ov, she was started on cozaar.  On return today , b/p has improved -near goal .  She says she is tolerating ok. No headache, visual or speech changes. No chest pain or edema Does complain that hip and back hurt, she takes motrin several times a day. No leg weakness or radicular symptoms.  We discussed alternatives for NSAIDS due to elevated b/p.  >> Please stop advil /Ibuprofen. Avoid NSAIDS -aleve,  May use Tylenol As needed  Pain .  May take Tramadol 50mg  1 every 8hr As needed  Pain , may make you sleepy.  If hip if not improving will need to be referred to Orthopedics.  Continue Losartan/HCTZ daily  Low salt diet.  04/20/2014 Acute OV  Feels like losartan is making her dizzy and nauseated, very lethargic. Feels like she does not have any energy. For the last few weeks. Gets lightheaded with standing . Has nausea on/off .  No vomiting or diarrhea.  She denies any headache, visual changes, chest pain, palpitations, presyncope, syncope, extremity weakness or speech changes. She did not take her losartan today and has been feeling better. rec Stop Losartan /Hctz  Change to Losartan 25mg  daily  Check blood pressure daily , keep log, call if >150 Low salt diet  Change positions slowly.  Meclizine 12.5mg  1/2 - 1 Twice daily  As needed  Dizziness.    05/11/2014 f/u ov/Nikodem Leadbetter re: hbp/ asthma/ dm Chief Complaint  Patient  presents with  . Acute Visit    doubled losartan to 25 mg bid on 05/07/2014 per MW.  pt states she feels well but her bp hasn't come down at all yet.    No ha, tia/claudication symptoms  No obvious day to day or daytime variabilty or assoc chronic cough or cp or chest tightness, subjective wheeze overt sinus or hb symptoms. No unusual exp hx or h/o childhood pna/ asthma or knowledge of premature birth.  Sleeping ok without nocturnal  or early am exacerbation  of respiratory  c/o's or need for noct saba. Also denies any obvious fluctuation of symptoms with weather or environmental changes or other aggravating or alleviating factors except as outlined above   Current Medications, Allergies, Complete Past Medical History, Past Surgical History, Family History, and Social History were reviewed in [employer] record.  ROS  The following are not active complaints unless bolded sore throat, dysphagia, dental problems, itching, sneezing,  nasal congestion or excess/ purulent secretions, ear ache,   fever, chills, sweats, unintended wt loss, pleuritic or exertional cp, hemoptysis,  orthopnea pnd or leg swelling, presyncope, palpitations, heartburn, abdominal pain, anorexia, nausea, vomiting, diarrhea  or change in bowel or urinary habits, change in stools or urine, dysuria,hematuria,  rash, arthralgias, visual complaints, headache, numbness weakness or ataxia or problems with walking or coordination,  change in mood/affect or memory.  Past Medical History:  ESSENTIAL HYPERTENSION, BENIGN (ICD-401.1)  Glaucoma................................................................................[name]/[name]  HYPERLIPIDEMIA (ICD-272.4)  - target LDL less than 70 because of diabetes  -Zocor 20mg  every other day at bedtime April 14, 2010>>> LDL 110 July 06, 2010 and no recurrent  cramps  RHINITIS (ICD-472.0)  Intermittent asthma  - exac by Timolol  but tolerates unless uri/flare  OSTEOPENIA (ICD-733.90)  - DEXA 12/28/07 PA Spine .655, L Hip -1.033, Lfem Neck -1.793  - DEXA 02/23/10 1.7 L Fem -1.9 Right Fem -1.8  OBESITY (ICD-278.00)  - Target wt = 148 for BMI < 30  AODM (ICD-250.00)  Health Maintenance.........................................................................Marland KitchenWert  - Td 03/2007 - Pneumovax 04/2013 . prevnar 03/03/14 - CPX 03/03/2014  - GYN Health Maint : G[name], declines mammography and colonoscopy  --med calendar 06/04/2012 , 08/28/2013         Objective:   Physical Exam   amb elderly wf nad   wt 159 November 05, 2008 >162 April 13, 2010 > 159 July 06, 2010 > 151 10/11/2010 >150 12/01/2010 > 150 01/11/2011 > 04/27/2011  148> 148 07/31/2011 > 10/30/2011  148 > 01/31/2012  147 >143 06/04/2012 > 09/23/2012  149> 01/01/13  142 > 04/03/2013  140> 03/03/2014 141 >145 03/17/2014 >>142 04/20/2014 > 05/11/2014   HEENT: nl dentition, turbinates, and orophanx. Nl external ear canals without cough reflex  NECK : without JVD/Nodes/TM/ nl carotid upstrokes bilaterally  LUNGS: no acc muscle, CTA  CV: RRR no s3 or murmur or increase in P2, no edema  ABD: soft and nontender with nl excursion in the supine position. No bruits or organomegaly, bowel sounds nl  MS: nl gait, warm without deformities, calf tenderness, cyanosis or clubbing,    Skin: no lesions Neuro:  Alert/ no motor or cerebellar def/ nl gait        CXR  03/03/2014 : No active cardiopulmonary disease.       Recent Labs Lab 03/03/14 1040  NA 138  K 4.1  CL 101  CO2 30  BUN 21  CREATININE 0.6  GLUCOSE 159*    Recent Labs Lab 03/03/14 1040  HGB 12.3  HCT 36.4  WBC 6.0  PLT 203.0     Lab Results  Component Value Date   TSH 3.19 03/03/2014      Assessment:

## 2014-05-14 NOTE — Assessment & Plan Note (Signed)
Not  Adequate control on present rx, reviewed > no change in rx needed  For now given the new guidelines and the likelihood some of this is stress related due to husband's terminal condition > recheck in 2 weeks

## 2014-05-14 NOTE — Assessment & Plan Note (Signed)
All goals of chronic asthma control met including optimal function and elimination of symptoms with minimal need for rescue therapy.  Contingencies discussed in full including contacting this office immediately if not controlling the symptoms using the rule of two's.    

## 2014-05-14 NOTE — Assessment & Plan Note (Signed)
-   Target LDL < 100 since has DM but poor statin tolerance   Lab Results  Component Value Date   CHOL 249* 03/03/2014   HDL 55.20 03/03/2014   LDLCALC 148* 08/28/2013   LDLDIRECT 149.1 03/03/2014   TRIG 220.0* 03/03/2014   CHOLHDL 5 03/03/2014     Not ideal but note hdl at least is high also > discussed with pt/continue diet only

## 2014-05-14 NOTE — Assessment & Plan Note (Signed)
Lab Results  Component Value Date   HGBA1C 7.4* 03/03/2014   HGBA1C 6.9* 08/28/2013   HGBA1C 7.4* 04/02/2013      Marginally Adequate control on present rx, reviewed > no change in rx needed  > diet key   Needs med reconciliation.  To keep things simple, I have asked the patient to first separate medicines that are perceived as maintenance, that is to be taken daily "no matter what", from those medicines that are taken on only on an as-needed basis and I have given the patient examples of both, and then return to see our NP to generate a  detailed  medication calendar which should be followed until the next physician sees the patient and updates it.

## 2014-05-18 ENCOUNTER — Ambulatory Visit (AMBULATORY_SURGERY_CENTER): Payer: Medicare Other | Admitting: Gastroenterology

## 2014-05-18 ENCOUNTER — Encounter: Payer: Self-pay | Admitting: Gastroenterology

## 2014-05-18 VITALS — BP 216/90 | HR 65 | Temp 96.1°F | Resp 20 | Ht 59.0 in | Wt 139.0 lb

## 2014-05-18 DIAGNOSIS — R1310 Dysphagia, unspecified: Secondary | ICD-10-CM | POA: Diagnosis not present

## 2014-05-18 DIAGNOSIS — K222 Esophageal obstruction: Secondary | ICD-10-CM | POA: Diagnosis not present

## 2014-05-18 DIAGNOSIS — E669 Obesity, unspecified: Secondary | ICD-10-CM | POA: Diagnosis not present

## 2014-05-18 DIAGNOSIS — R1314 Dysphagia, pharyngoesophageal phase: Secondary | ICD-10-CM

## 2014-05-18 DIAGNOSIS — E119 Type 2 diabetes mellitus without complications: Secondary | ICD-10-CM | POA: Diagnosis not present

## 2014-05-18 DIAGNOSIS — I10 Essential (primary) hypertension: Secondary | ICD-10-CM | POA: Diagnosis not present

## 2014-05-18 DIAGNOSIS — R1319 Other dysphagia: Secondary | ICD-10-CM

## 2014-05-18 LAB — GLUCOSE, CAPILLARY
Glucose-Capillary: 117 mg/dL — ABNORMAL HIGH (ref 70–99)
Glucose-Capillary: 112 mg/dL — ABNORMAL HIGH (ref 70–99)

## 2014-05-18 MED ORDER — OMEPRAZOLE 40 MG PO CPDR: 40.0000 mg | DELAYED_RELEASE_CAPSULE | Freq: Every day | ORAL | Status: DC

## 2014-05-18 MED ORDER — SODIUM CHLORIDE 0.9 % IV SOLN: 500.0000 mL | INTRAVENOUS | Status: DC

## 2014-05-18 NOTE — Progress Notes (Signed)
Report to PACU, RN, vss, BBS= Clear.  

## 2014-05-18 NOTE — Progress Notes (Signed)
Called to room to assist during endoscopic procedure.  Patient ID and intended procedure confirmed with present staff. Received instructions for my participation in the procedure from the performing physician.  

## 2014-05-18 NOTE — Op Note (Signed)
[place]  [address] [place], [id number]   ENDOSCOPY PROCEDURE REPORT  PATIENT: Mikayla [name], Mikayla [name]  MR#: [id number] BIRTHDATE: Dec 04, 1925 , 88  yrs. old GENDER: female ENDOSCOPIST: Milus Banister, MD PROCEDURE DATE:  05/18/2014 PROCEDURE:  EGD w/ balloon dilation ASA CLASS:     Class II INDICATIONS:  recent esophageal food impaction, noted underlying GE junction stricture. MEDICATIONS: Monitored anesthesia care, Propofol 100 mg IV, and lidocaine 100mg  IV TOPICAL ANESTHETIC: none  DESCRIPTION OF PROCEDURE: After the risks benefits and alternatives of the procedure were thoroughly explained, informed consent was obtained.  The LB [id number] endoscope was introduced through the mouth and advanced to the second portion of the duodenum , Without limitations.  The instrument was slowly withdrawn as the mucosa was fully examined.  There was a focal peptic stricture vs.  thick Schatzki's ring at the GE junction.  This did not obstruct passage of adult gastroscope. There was a 3-4cm hiatal hernia.  The examination was otherwise normal.  The GE junction stricture was dilated sequentially from 67mm to 24mm using CRE TTS balloon.  There was the usual superficial mucosal tear and self limited oozing of blood following dilation.  The examination was otherwise normal.  Retroflexed views revealed no abnormalities.     The scope was then withdrawn from the patient and the procedure completed.  COMPLICATIONS: There were no immediate complications.  ENDOSCOPIC IMPRESSION: There was a focal peptic stricture vs.  thick Schatzki's ring at the GE junction.  This did not obstruct passage of adult gastroscope. There was a 3-4cm hiatal hernia.  The examination was otherwise normal.  The GE junction stricture was dilated sequentially from 67mm to 42mm using CRE TTS balloon.  There was the usual superficial mucosal tear and self limited oozing of blood  following dilation.  The examination was otherwise normal  RECOMMENDATIONS: New prescription was called in today for omeprazole.  You should take one pill daily, indefinitely.  Continue to chew your food well, eat slowly and take small bites.  eSigned:  Milus Banister, MD 05/18/2014 2:10 PM    CC: Clois Comber, MD

## 2014-05-18 NOTE — Patient Instructions (Signed)
Discharge instructions given. Handout on a dilatation diet. Resume previous medications. YOU HAD AN ENDOSCOPIC PROCEDURE TODAY AT [facility]: Refer to the procedure report that was given to you for any specific questions about what was found during the examination.  If the procedure report does not answer your questions, please call your gastroenterologist to clarify.  If you requested that your care partner not be given the details of your procedure findings, then the procedure report has been included in a sealed envelope for you to review at your convenience later.  YOU SHOULD EXPECT: Some feelings of bloating in the abdomen. Passage of more gas than usual.  Walking can help get rid of the air that was put into your GI tract during the procedure and reduce the bloating. If you had a lower endoscopy (such as a colonoscopy or flexible sigmoidoscopy) you may notice spotting of blood in your stool or on the toilet paper. If you underwent a bowel prep for your procedure, then you may not have a normal bowel movement for a few days.  DIET: Your first meal following the procedure should be a light meal and then it is ok to progress to your normal diet.  A half-sandwich or bowl of soup is an example of a good first meal.  Heavy or fried foods are harder to digest and may make you feel nauseous or bloated.  Likewise meals heavy in dairy and vegetables can cause extra gas to form and this can also increase the bloating.  Drink plenty of fluids but you should avoid alcoholic beverages for 24 hours.  ACTIVITY: Your care partner should take you home directly after the procedure.  You should plan to take it easy, moving slowly for the rest of the day.  You can resume normal activity the day after the procedure however you should NOT DRIVE or use heavy machinery for 24 hours (because of the sedation medicines used during the test).    SYMPTOMS TO REPORT IMMEDIATELY: A gastroenterologist can be  reached at any hour.  During normal business hours, 8:30 AM to 5:00 PM Monday through Friday, call [phone].  After hours and on weekends, please call the GI answering service at [phone] who will take a message and have the physician on call contact you.   Following upper endoscopy (EGD)  Vomiting of blood or coffee ground material  New chest pain or pain under the shoulder blades  Painful or persistently difficult swallowing  New shortness of breath  Fever of 100F or higher  Black, tarry-looking stools  FOLLOW UP: If any biopsies were taken you will be contacted by phone or by letter within the next 1-3 weeks.  Call your gastroenterologist if you have not heard about the biopsies in 3 weeks.  Our staff will call the home number listed on your records the next business day following your procedure to check on you and address any questions or concerns that you may have at that time regarding the information given to you following your procedure. This is a courtesy call and so if there is no answer at the home number and we have not heard from you through the emergency physician on call, we will assume that you have returned to your regular daily activities without incident.  SIGNATURES/CONFIDENTIALITY: You and/or your care partner have signed paperwork which will be entered into your electronic medical record.  These signatures attest to the fact that that the information above on your After  Visit Summary has been reviewed and is understood.  Full responsibility of the confidentiality of this discharge information lies with you and/or your care-partner. 

## 2014-05-19 ENCOUNTER — Telehealth: Payer: Self-pay | Admitting: *Deleted

## 2014-05-19 NOTE — Telephone Encounter (Signed)
  Follow up Call-  Call back number 05/18/2014  Post procedure Call Back phone  # [phone]  Permission to leave phone message Yes     Patient questions:  Do you have a fever, pain , or abdominal swelling? No. Pain Score  0 *  Have you tolerated food without any problems? Yes.    Have you been able to return to your normal activities? Yes.    Do you have any questions about your discharge instructions: Diet   No. Medications  No. Follow up visit  No.  Do you have questions or concerns about your Care? No.  Actions: * If pain score is 4 or above: No action needed, pain <4.  Pt. C/o  Of dripping nose,but stated that she was o.k and i informed her that is probably from oxygen, but told her she can call us back if it does not go away.

## 2014-05-28 ENCOUNTER — Encounter: Payer: Medicare Other | Admitting: Adult Health

## 2014-06-04 DIAGNOSIS — H3532 Exudative age-related macular degeneration: Secondary | ICD-10-CM | POA: Diagnosis not present

## 2014-06-04 DIAGNOSIS — H3531 Nonexudative age-related macular degeneration: Secondary | ICD-10-CM | POA: Diagnosis not present

## 2014-06-11 ENCOUNTER — Ambulatory Visit (INDEPENDENT_AMBULATORY_CARE_PROVIDER_SITE_OTHER): Payer: Medicare Other | Admitting: Adult Health

## 2014-06-11 ENCOUNTER — Encounter: Payer: Self-pay | Admitting: Adult Health

## 2014-06-11 ENCOUNTER — Encounter: Payer: Medicare Other | Admitting: Adult Health

## 2014-06-11 VITALS — BP 128/76 | HR 87 | Temp 97.8°F | Ht <= 58 in | Wt 141.0 lb

## 2014-06-11 DIAGNOSIS — I10 Essential (primary) hypertension: Secondary | ICD-10-CM | POA: Diagnosis not present

## 2014-06-11 DIAGNOSIS — E1169 Type 2 diabetes mellitus with other specified complication: Secondary | ICD-10-CM | POA: Diagnosis not present

## 2014-06-11 DIAGNOSIS — E785 Hyperlipidemia, unspecified: Secondary | ICD-10-CM

## 2014-06-11 DIAGNOSIS — E119 Type 2 diabetes mellitus without complications: Secondary | ICD-10-CM | POA: Diagnosis not present

## 2014-06-11 DIAGNOSIS — E669 Obesity, unspecified: Secondary | ICD-10-CM

## 2014-06-11 NOTE — Progress Notes (Signed)
Subjective:     Patient ID: Mikayla [name], female   DOB: 10-17-1925 .   MRN: [id number]  Brief patient profile:  76 yowf never smoker with moderate obesity> adult onset diabetes dx 2002 and hyperlipidemia and asthma  HPI March 17, 2009 ov Acute visit. Pt c/o cough x 3 days. Cough is prod with yellow sputum. She also c/o wheezing. All started 10/29 with scratchy throat and nasal congestion. on timolol eye drops with no h/o asthma. rec change to betoptic > resolved.    03/17/2014 Follow up HTN  Pt returns for 2 week follow up for HTN B/p was elevated at last ov, she was started on cozaar.  On return today , b/p has improved -near goal .  She says she is tolerating ok. No headache, visual or speech changes. No chest pain or edema Does complain that hip and back hurt, she takes motrin several times a day. No leg weakness or radicular symptoms.  We discussed alternatives for NSAIDS due to elevated b/p.  >> Please stop advil /Ibuprofen. Avoid NSAIDS -aleve,  May use Tylenol As needed  Pain .  May take Tramadol 50mg  1 every 8hr As needed  Pain , may make you sleepy.  If hip if not improving will need to be referred to Orthopedics.  Continue Losartan/HCTZ daily  Low salt diet.  04/20/2014 Acute OV  Feels like losartan is making her dizzy and nauseated, very lethargic. Feels like she does not have any energy. For the last few weeks. Gets lightheaded with standing . Has nausea on/off .  No vomiting or diarrhea.  She denies any headache, visual changes, chest pain, palpitations, presyncope, syncope, extremity weakness or speech changes. She did not take her losartan today and has been feeling better. rec Stop Losartan /Hctz  Change to Losartan 25mg  daily  Check blood pressure daily , keep log, call if >150 Low salt diet  Change positions slowly.  Meclizine 12.5mg  1/2 - 1 Twice daily  As needed  Dizziness.    05/11/2014 f/u ov/[name] re: hbp/ asthma/ dm Chief Complaint  Patient  presents with  . Acute Visit    doubled losartan to 25 mg bid on 05/07/2014 per MW.  pt states she feels well but her bp hasn't come down at all yet.   >>  06/11/2014 Follow up : HTN/Asthma and DM Patient returns for 1 month follow-up Overall, stating that she is doing well without any flare of her cough, wheezing. She denies any chest pain, orthopnea, PND or leg swelling   Current Medications, Allergies, Complete Past Medical History, Past Surgical History, Family History, and Social History were reviewed in [facility] record.  ROS  The following are not active complaints unless bolded sore throat, dysphagia, dental problems, itching, sneezing,  nasal congestion or excess/ purulent secretions, ear ache,   fever, chills, sweats, unintended wt loss, pleuritic or exertional cp, hemoptysis,  orthopnea pnd or leg swelling, presyncope, palpitations, heartburn, abdominal pain, anorexia, nausea, vomiting, diarrhea  or change in bowel or urinary habits, change in stools or urine, dysuria,hematuria,  rash, arthralgias, visual complaints, headache, numbness weakness or ataxia or problems with walking or coordination,  change in mood/affect or memory.                        Past Medical History:  ESSENTIAL HYPERTENSION, BENIGN (ICD-401.1)  Glaucoma................................................................................[name]/[name]  HYPERLIPIDEMIA (ICD-272.4)  - target LDL less than 70 because of diabetes  -Zocor 20mg  every  other day at bedtime April 14, 2010>>> LDL 110 July 06, 2010 and no recurrent  cramps  RHINITIS (ICD-472.0)  Intermittent asthma  - exac by Timolol but tolerates unless uri/flare  OSTEOPENIA (ICD-733.90)  - DEXA 12/28/07 PA Spine .655, L Hip -1.033, Lfem Neck -1.793  - DEXA 02/23/10 1.7 L Fem -1.9 Right Fem -1.8  OBESITY (ICD-278.00)  - Target wt = 148 for BMI < 30  AODM (ICD-250.00)  Health  Maintenance.........................................................................Marland KitchenWert  - Td 03/2007 - Pneumovax 04/2013 . prevnar 03/03/14 - CPX 03/03/2014  - GYN Health Maint : G[name], declines mammography and colonoscopy  --med calendar 06/04/2012 , 08/28/2013 , 04/20/14        Objective:   Physical Exam   amb elderly wf nad   wt 159 November 05, 2008 >162 April 13, 2010 > 159 July 06, 2010 > 151 10/11/2010 >150 12/01/2010 > 150 01/11/2011 > 04/27/2011  148> 148 07/31/2011 > 10/30/2011  148 > 01/31/2012  147 >143 06/04/2012 > 09/23/2012  149> 01/01/13  142 > 04/03/2013  140> 03/03/2014 141 >145 03/17/2014 >>142 04/20/2014 > 05/11/2014 >141 >141 06/11/14   HEENT: nl dentition, turbinates, and orophanx. Nl external ear canals without cough reflex  NECK : without JVD/Nodes/TM/ nl carotid upstrokes bilaterally  LUNGS: no acc muscle, CTA  CV: RRR no s3 or murmur or increase in P2, no edema  ABD: soft and nontender with nl excursion in the supine position. No bruits or organomegaly, bowel sounds nl  MS: nl gait, warm without deformities, calf tenderness, cyanosis or clubbing,    Skin: no lesions Neuro:  Alert/ no motor or cerebellar def/ nl gait        CXR  03/03/2014 : No active cardiopulmonary disease.       Recent Labs Lab 03/03/14 1040  NA 138  K 4.1  CL 101  CO2 30  BUN 21  CREATININE 0.6  GLUCOSE 159*    Recent Labs Lab 03/03/14 1040  HGB 12.3  HCT 36.4  WBC 6.0  PLT 203.0     Lab Results  Component Value Date   TSH 3.19 03/03/2014      Assessment:

## 2014-06-11 NOTE — Patient Instructions (Signed)
Low fat cholesterol diet.  Keep sweets low.  Follow up Dr. Melvyn Novas  In 3-4 months and As needed   Please contact office for sooner follow up if symptoms do not improve or worsen or seek emergency care

## 2014-06-26 NOTE — Assessment & Plan Note (Signed)
Low fat cholesterol diet.  Keep sweets low.  Follow up Dr. Melvyn Novas  In 3-4 months and As needed   Please contact office for sooner follow up if symptoms do not improve or worsen or seek emergency care

## 2014-06-26 NOTE — Assessment & Plan Note (Signed)
Controlled. Continue current regimen  Low fat cholesterol diet.  Keep sweets low.  Follow up Dr. Melvyn Novas  In 3-4 months and As needed   Please contact office for sooner follow up if symptoms do not improve or worsen or seek emergency care

## 2014-07-08 ENCOUNTER — Other Ambulatory Visit: Payer: Self-pay | Admitting: Internal Medicine

## 2014-07-08 MED ORDER — LOSARTAN POTASSIUM 25 MG PO TABS: 25.0000 mg | ORAL_TABLET | Freq: Two times a day (BID) | ORAL | Status: DC

## 2014-08-20 DIAGNOSIS — H4011X1 Primary open-angle glaucoma, mild stage: Secondary | ICD-10-CM | POA: Diagnosis not present

## 2014-09-10 ENCOUNTER — Ambulatory Visit: Payer: Medicare Other | Admitting: Internal Medicine

## 2014-09-22 ENCOUNTER — Encounter: Payer: Self-pay | Admitting: Internal Medicine

## 2014-09-22 ENCOUNTER — Ambulatory Visit (INDEPENDENT_AMBULATORY_CARE_PROVIDER_SITE_OTHER): Payer: Medicare Other | Admitting: Internal Medicine

## 2014-09-22 ENCOUNTER — Other Ambulatory Visit (INDEPENDENT_AMBULATORY_CARE_PROVIDER_SITE_OTHER): Payer: Medicare Other

## 2014-09-22 VITALS — BP 144/80 | HR 78 | Ht 59.0 in | Wt 141.6 lb

## 2014-09-22 DIAGNOSIS — E1169 Type 2 diabetes mellitus with other specified complication: Secondary | ICD-10-CM

## 2014-09-22 DIAGNOSIS — E669 Obesity, unspecified: Secondary | ICD-10-CM

## 2014-09-22 DIAGNOSIS — E119 Type 2 diabetes mellitus without complications: Secondary | ICD-10-CM | POA: Diagnosis not present

## 2014-09-22 DIAGNOSIS — R5383 Other fatigue: Secondary | ICD-10-CM | POA: Insufficient documentation

## 2014-09-22 DIAGNOSIS — R5382 Chronic fatigue, unspecified: Secondary | ICD-10-CM | POA: Diagnosis not present

## 2014-09-22 DIAGNOSIS — J4520 Mild intermittent asthma, uncomplicated: Secondary | ICD-10-CM | POA: Diagnosis not present

## 2014-09-22 DIAGNOSIS — I10 Essential (primary) hypertension: Secondary | ICD-10-CM | POA: Diagnosis not present

## 2014-09-22 LAB — CBC WITH DIFFERENTIAL/PLATELET
Basophils Absolute: 0 10*3/uL (ref 0.0–0.1)
Basophils Relative: 0.2 % (ref 0.0–3.0)
Eosinophils Absolute: 0.2 10*3/uL (ref 0.0–0.7)
Eosinophils Relative: 3.3 % (ref 0.0–5.0)
HEMATOCRIT: 33.2 % — AB (ref 36.0–46.0)
Hemoglobin: 11.5 g/dL — ABNORMAL LOW (ref 12.0–15.0)
LYMPHS ABS: 1.5 10*3/uL (ref 0.7–4.0)
Lymphocytes Relative: 23.5 % (ref 12.0–46.0)
MCHC: 34.8 g/dL (ref 30.0–36.0)
MCV: 84.8 fl (ref 78.0–100.0)
MONO ABS: 0.5 10*3/uL (ref 0.1–1.0)
Monocytes Relative: 7.6 % (ref 3.0–12.0)
NEUTROS PCT: 65.4 % (ref 43.0–77.0)
Neutro Abs: 4.3 10*3/uL (ref 1.4–7.7)
PLATELETS: 201 10*3/uL (ref 150.0–400.0)
RBC: 3.91 Mil/uL (ref 3.87–5.11)
RDW: 13.7 % (ref 11.5–15.5)
WBC: 6.6 10*3/uL (ref 4.0–10.5)

## 2014-09-22 LAB — BASIC METABOLIC PANEL
BUN: 23 mg/dL (ref 6–23)
CALCIUM: 9.7 mg/dL (ref 8.4–10.5)
CHLORIDE: 100 meq/L (ref 96–112)
CO2: 30 mEq/L (ref 19–32)
CREATININE: 0.9 mg/dL (ref 0.40–1.20)
GFR: 62.92 mL/min (ref >60.00)
Glucose, Bld: 132 mg/dL — ABNORMAL HIGH (ref 70–99)
Potassium: 4.6 mEq/L (ref 3.5–5.1)
Sodium: 135 mEq/L (ref 135–145)

## 2014-09-22 LAB — TSH: TSH: 3.65 u[IU]/mL (ref 0.35–4.50)

## 2014-09-22 LAB — HEMOGLOBIN A1C: HEMOGLOBIN A1C: 7.1 % — AB (ref 4.6–6.5)

## 2014-09-22 MED ORDER — BUDESONIDE-FORMOTEROL FUMARATE 80-4.5 MCG/ACT IN AERO: 2.0000 | INHALATION_SPRAY | Freq: Two times a day (BID) | RESPIRATORY_TRACT | Status: DC | PRN

## 2014-09-22 NOTE — Progress Notes (Signed)
Subjective:     Patient ID: Mikayla [name], female   DOB: [birth year] .   MRN: [id number]  Brief patient profile:  63 yowf never smoker with moderate obesity> adult onset diabetes dx 2002 and hyperlipidemia and asthma    HPI 09/22/2014 f/u ov/[name] re:  Dm/ hb/ asthma / following med calendar well  Chief Complaint  Patient presents with  . Follow-up    Pt states that her BP readings have been up and down.  No new co's today.   rarely needing symbicort/ hfa not good technique (see a/p) No overt dm symptoms    Not limited by breathing from desired activities  But more by fatigue/ sleeping poorly  No chronic cough or cp or chest tightness, subjective wheeze overt sinus or hb symptoms. No unusual exp hx or h/o childhood pna/ asthma or knowledge of premature birth.  Sleeping ok without nocturnal  or early am exacerbation  of respiratory  c/o's or need for noct saba. Also denies any obvious fluctuation of symptoms with weather or environmental changes or other aggravating or alleviating factors except as outlined above   Current Medications, Allergies, Complete Past Medical History, Past Surgical History, Family History, and Social History were reviewed in [employer] [facility] record.  ROS  The following are not active complaints unless bolded sore throat, dysphagia, dental problems, itching, sneezing,  nasal congestion or excess/ purulent secretions, ear ache,   fever, chills, sweats, unintended wt loss, pleuritic or exertional cp, hemoptysis,  orthopnea pnd or leg swelling, presyncope, palpitations, heartburn, abdominal pain, anorexia, nausea, vomiting, diarrhea  or change in bowel or urinary habits, change in stools or urine, dysuria,hematuria,  rash, arthralgias, visual complaints, headache, numbness weakness or ataxia or problems with walking or coordination,  change in mood/affect or memory.                             Past Medical History:  ESSENTIAL HYPERTENSION,  BENIGN (ICD-401.1)  Glaucoma................................................................................[name]/[name]  HYPERLIPIDEMIA (ICD-272.4)  - target LDL less than 70 because of diabetes  -Zocor 20mg  every other day at bedtime April 14, 2010>>> LDL 110 July 06, 2010 and no recurrent  cramps  RHINITIS (ICD-472.0)  Intermittent asthma  - exac by Timolol but tolerates unless uri/flare  OSTEOPENIA (ICD-733.90)  - DEXA 12/28/07 PA Spine .655, L Hip -1.033, Lfem Neck -1.793  - DEXA 02/23/10 1.7 L Fem -1.9 Right Fem -1.8  OBESITY (ICD-278.00)  - Target wt = 148 for BMI < 30  AODM (ICD-250.00)  Health Maintenance.........................................................................Marland KitchenWert  - Td 03/2007 - Pneumovax 04/2013 . prevnar 03/03/14 - CPX 03/03/2014  - GYN Health Maint : G[name], declines mammography and colonoscopy  --med calendar 06/04/2012 , 08/28/2013 , 04/20/14        Objective:   Physical Exam   amb elderly wf nad   wt 159 November 05, 2008 >162 April 13, 2010 > 159 July 06, 2010 > 151 10/11/2010 >150 12/01/2010 > 150 01/11/2011 > 04/27/2011  148> 148 07/31/2011 > 10/30/2011  148 > 01/31/2012  147 >143 06/04/2012 > 09/23/2012  149> 01/01/13  142 > 04/03/2013  140> 03/03/2014 141 >145 03/17/2014 >>142 04/20/2014 > 05/11/2014 >141 >141 06/11/14 > 09/22/2014 142   HEENT: nl dentition, turbinates, and orophanx. Nl external ear canals without cough reflex  NECK : without JVD/Nodes/TM/ nl carotid upstrokes bilaterally  LUNGS: no acc muscle, CTA  CV: RRR no s3 or murmur or increase in P2, no edema  ABD: soft and nontender with nl excursion in the supine position. No bruits or organomegaly, bowel sounds nl  MS: nl gait, warm without deformities, calf tenderness, cyanosis or clubbing,    Skin: no lesions Neuro:  Alert/ no motor or cerebellar def/ nl gait         Labs ordered/ reviewed:    Lab 09/22/14 1013  NA 135  K 4.6  CL 100  CO2 30  BUN 23   CREATININE 0.90  GLUCOSE 132*      Lab 09/22/14 1013  HGB 11.5*  HCT 33.2*  WBC 6.6  PLT 201.0        Lab Results  Component Value Date   HGBA1C 7.1* 09/22/2014   HGBA1C 7.4* 03/03/2014   HGBA1C 6.9* 08/28/2013              Assessment:

## 2014-09-22 NOTE — Patient Instructions (Addendum)
Please remember to go to the lab   department downstairs for your tests - we will call you with the results when they are available.     Please schedule a follow up visit in 3 months but call sooner if needed  

## 2014-09-23 ENCOUNTER — Telehealth: Payer: Self-pay | Admitting: Internal Medicine

## 2014-09-23 ENCOUNTER — Encounter: Payer: Self-pay | Admitting: Internal Medicine

## 2014-09-23 NOTE — Progress Notes (Signed)
Quick Note:  LMTCB ______ 

## 2014-09-23 NOTE — Telephone Encounter (Signed)
Notes Recorded by Tanda Rockers, MD on 09/22/2014 at 5:20 PM Call patient : Studies are unremarkable, no change in recs -------------------------------------------------  Pt aware of results/recs.  Nothing further needed.

## 2014-09-23 NOTE — Assessment & Plan Note (Signed)
Adequate control on present rx, reviewed > no change in rx needed   

## 2014-09-23 NOTE — Assessment & Plan Note (Signed)
The proper method of use, as well as anticipated side effects, of a metered-dose inhaler are discussed and demonstrated to the patient. Improved effectiveness after extensive coaching during this visit to a level of approximately  50%   Since rarely needing symbicort need to remind her on each ov how to use hfa effectively

## 2014-09-23 NOTE — Assessment & Plan Note (Signed)
No obvious cause other than poor sleep hygiene due to having to look after her husband on hospice with worsening dementia/ nocturnal issues

## 2014-11-12 DIAGNOSIS — H4011X1 Primary open-angle glaucoma, mild stage: Secondary | ICD-10-CM | POA: Diagnosis not present

## 2014-11-24 ENCOUNTER — Telehealth: Payer: Self-pay | Admitting: Internal Medicine

## 2014-11-24 MED ORDER — LOSARTAN POTASSIUM 25 MG PO TABS: 25.0000 mg | ORAL_TABLET | Freq: Two times a day (BID) | ORAL | Status: DC

## 2014-11-24 NOTE — Telephone Encounter (Signed)
ATC pt line rang busy x 3 wcb 

## 2014-11-24 NOTE — Telephone Encounter (Signed)
Called pt. She is asking to have her losartan sent for 90 day supply. I have done so. Nothing further needed

## 2014-12-08 DIAGNOSIS — H3532 Exudative age-related macular degeneration: Secondary | ICD-10-CM | POA: Diagnosis not present

## 2014-12-08 DIAGNOSIS — H3531 Nonexudative age-related macular degeneration: Secondary | ICD-10-CM | POA: Diagnosis not present

## 2014-12-08 DIAGNOSIS — E10329 Type 1 diabetes mellitus with mild nonproliferative diabetic retinopathy without macular edema: Secondary | ICD-10-CM | POA: Diagnosis not present

## 2014-12-22 ENCOUNTER — Ambulatory Visit: Payer: Medicare Other | Admitting: Internal Medicine

## 2014-12-29 ENCOUNTER — Encounter: Payer: Self-pay | Admitting: Internal Medicine

## 2014-12-29 ENCOUNTER — Ambulatory Visit (INDEPENDENT_AMBULATORY_CARE_PROVIDER_SITE_OTHER): Payer: Medicare Other | Admitting: Internal Medicine

## 2014-12-29 ENCOUNTER — Other Ambulatory Visit (INDEPENDENT_AMBULATORY_CARE_PROVIDER_SITE_OTHER): Payer: Medicare Other

## 2014-12-29 VITALS — BP 132/78 | HR 61 | Ht 62.0 in | Wt 139.2 lb

## 2014-12-29 DIAGNOSIS — E1169 Type 2 diabetes mellitus with other specified complication: Secondary | ICD-10-CM

## 2014-12-29 DIAGNOSIS — E669 Obesity, unspecified: Secondary | ICD-10-CM

## 2014-12-29 DIAGNOSIS — I10 Essential (primary) hypertension: Secondary | ICD-10-CM

## 2014-12-29 DIAGNOSIS — E785 Hyperlipidemia, unspecified: Secondary | ICD-10-CM

## 2014-12-29 DIAGNOSIS — E119 Type 2 diabetes mellitus without complications: Secondary | ICD-10-CM | POA: Diagnosis not present

## 2014-12-29 DIAGNOSIS — J4520 Mild intermittent asthma, uncomplicated: Secondary | ICD-10-CM | POA: Diagnosis not present

## 2014-12-29 LAB — BASIC METABOLIC PANEL
BUN: 23 mg/dL (ref 6–23)
CHLORIDE: 101 meq/L (ref 96–112)
CO2: 31 meq/L (ref 19–32)
CREATININE: 0.92 mg/dL (ref 0.40–1.20)
Calcium: 9.4 mg/dL (ref 8.4–10.5)
GFR: 61.31 mL/min (ref >60.00)
Glucose, Bld: 145 mg/dL — ABNORMAL HIGH (ref 70–99)
POTASSIUM: 4.6 meq/L (ref 3.5–5.1)
Sodium: 137 mEq/L (ref 135–145)

## 2014-12-29 LAB — HEMOGLOBIN A1C: HEMOGLOBIN A1C: 7.2 % — AB (ref 4.6–6.5)

## 2014-12-29 NOTE — Patient Instructions (Signed)
Please remember to go to the lab  department downstairs for your tests - we will call you with the results when they are available.    Let your eye doctor know you will need a substitute for the timolol  Dr Prudencio Burly   Please schedule a follow up visit in 3 months but call sooner if needed

## 2014-12-29 NOTE — Assessment & Plan Note (Signed)
-   new onset 03/03/2014 ? Stress related   Adequate control on present rx, reviewed > no change in rx needed

## 2014-12-30 ENCOUNTER — Telehealth: Payer: Self-pay | Admitting: Internal Medicine

## 2014-12-30 NOTE — Telephone Encounter (Signed)
Notes Recorded by Levander Campion, LPN on 07/13/6008 at 9:32 PM Left message for pt to call back to discuss results. Notes Recorded by Tanda Rockers, MD on 12/29/2014 at 5:17 PM Call patient : Studies are unremarkable, no change in recs -------- Pt is aware of results.

## 2014-12-31 ENCOUNTER — Other Ambulatory Visit: Payer: Self-pay | Admitting: Emergency Medicine

## 2014-12-31 MED ORDER — METFORMIN HCL 500 MG PO TABS: 500.0000 mg | ORAL_TABLET | Freq: Two times a day (BID) | ORAL | Status: DC

## 2015-01-03 ENCOUNTER — Encounter: Payer: Self-pay | Admitting: Internal Medicine

## 2015-01-03 NOTE — Assessment & Plan Note (Signed)
Adequate control on present rx, reviewed > no change in rx needed   

## 2015-01-03 NOTE — Assessment & Plan Note (Signed)
All goals of chronic asthma control met including optimal function and elimination of symptoms with nol need for rescue therapy using a strategy of prn symbicort and holding timolol if symbicort not effective  Contingencies discussed in full including contacting this office immediately if not controlling the symptoms using the rule of two's.     I had an extended discussion with the patient reviewing all relevant studies completed to date and  lasting 15 to 20 minutes of a 25 minute visit      Each maintenance medication was reviewed in detail including most importantly the difference between maintenance and as needed and under what circumstances the prns are to be used. This was done in the context of a medication calendar review which provided the patient with a user-friendly unambiguous mechanism for medication administration and reconciliation and provides an action plan for all active problems. It is critical that this be shown to every doctor  for modification during the office visit if necessary so the patient can use it as a working document.

## 2015-01-03 NOTE — Progress Notes (Addendum)
Subjective:     Patient ID: Mikayla [name], female   DOB: 25-Feb-1926 .   MRN: [id number]  Brief patient profile:  20 yowf never smoker with moderate obesity> adult onset diabetes dx 2002 and hyperlipidemia and asthma    History of Present Illness   12/29/2014 f/u ov/Tyquez Hollibaugh re:  Dm/ hb/ asthma / following med calendar well Chief Complaint  Patient presents with  . Follow-up    Pt states that latest BP readings have been stable recently. Pt states non-productive cough with wheezing x 7 days      Not limited by breathing from desired activities  But more by fatigue/ sleeping poorly  No chronic cough or cp or chest tightness, subjective wheeze overt sinus or hb symptoms. No unusual exp hx or h/o childhood pna/ asthma or knowledge of premature birth.  Sleeping ok without nocturnal  or early am exacerbation  of respiratory  c/o's or need for noct saba. Also denies any obvious fluctuation of symptoms with weather or environmental changes or other aggravating or alleviating factors except as outlined above   Current Medications, Allergies, Complete Past Medical History, Past Surgical History, Family History, and Social History were reviewed in [employer] [facility] record.  ROS  The following are not active complaints unless bolded sore throat, dysphagia, dental problems, itching, sneezing,  nasal congestion or excess/ purulent secretions, ear ache,   fever, chills, sweats, unintended wt loss, pleuritic or exertional cp, hemoptysis,  orthopnea pnd or leg swelling, presyncope, palpitations, heartburn, abdominal pain, anorexia, nausea, vomiting, diarrhea  or change in bowel or urinary habits, change in stools or urine, dysuria,hematuria,  rash, arthralgias, visual complaints, headache, numbness weakness or ataxia or problems with walking or coordination,  change in mood/affect or memory.                             Past Medical History:  ESSENTIAL HYPERTENSION, BENIGN  (ICD-401.1)  Glaucoma................................................................................[name]/[name]  HYPERLIPIDEMIA (ICD-272.4)  - target LDL less than 70 because of diabetes  -Zocor 20mg  every other day at bedtime April 14, 2010>>> LDL 110 July 06, 2010 and no recurrent  cramps  RHINITIS (ICD-472.0)  Intermittent asthma  - exac by Timolol but tolerates unless uri/flare  OSTEOPENIA (ICD-733.90)  - DEXA 12/28/07 PA Spine .655, L Hip -1.033, Lfem Neck -1.793  - DEXA 02/23/10 1.7 L Fem -1.9 Right Fem -1.8  OBESITY (ICD-278.00)  - Target wt = 148 for BMI < 30  AODM (ICD-250.00)  Health Maintenance.........................................................................Marland KitchenWert  - Td 03/2007 - Pneumovax 04/2013 . prevnar 03/03/14 - CPX 03/03/2014  - GYN Health Maint : G[name], declines mammography and colonoscopy  --med calendar 06/04/2012 , 08/28/2013 , 04/20/14        Objective:   Physical Exam   amb elderly somber  wf nad   wt 159 November 05, 2008 >162 April 13, 2010 > 159 July 06, 2010 > 151 10/11/2010 >150 12/01/2010 > 150 01/11/2011 > 04/27/2011  148> 148 07/31/2011 > 10/30/2011  148 > 01/31/2012  147 >143 06/04/2012 > 09/23/2012  149> 01/01/13  142 > 04/03/2013  140> 03/03/2014 141 >145 03/17/2014 >>142 04/20/2014 > 05/11/2014 >141 >141 06/11/14 > 09/22/2014 142 > 12/29/2014 139   HEENT: nl dentition, turbinates, and orophanx. Nl external ear canals without cough reflex  NECK : without JVD/Nodes/TM/ nl carotid upstrokes bilaterally  LUNGS: no acc muscle, CTA  CV: RRR no s3 or murmur or increase in P2, no edema  ABD: soft and nontender with nl excursion in the supine position. No bruits or organomegaly, bowel sounds nl  MS: nl gait, warm without deformities, calf tenderness, cyanosis or clubbing,    Skin: no lesions Neuro:  Alert/ no motor or cerebellar def/ nl gait         Labs ordered/ reviewed:      Lab 12/29/14 1019  NA 137  K 4.6  CL 101  CO2  31  BUN 23  CREATININE 0.92  GLUCOSE 145*             Lab Results  Component Value Date   HGBA1C 7.2* 12/29/2014   HGBA1C 7.1* 09/22/2014   HGBA1C 7.4* 03/03/2014                  Assessment:

## 2015-01-03 NOTE — Assessment & Plan Note (Signed)
-   Target LDL < 100 since has DM but poor statin tolerance    Lab Results  Component Value Date   CHOL 249* 03/03/2014   HDL 55.20 03/03/2014   LDLCALC 148* 08/28/2013   LDLDIRECT 149.1 03/03/2014   TRIG 220.0* 03/03/2014   CHOLHDL 5 03/03/2014   Reasonable control since can't push zocor higher / discussed further efforts at diet/ wt loss

## 2015-01-12 ENCOUNTER — Other Ambulatory Visit: Payer: Self-pay | Admitting: Internal Medicine

## 2015-01-12 MED ORDER — LOSARTAN POTASSIUM 25 MG PO TABS: 25.0000 mg | ORAL_TABLET | Freq: Two times a day (BID) | ORAL | Status: DC

## 2015-04-01 DIAGNOSIS — H401122 Primary open-angle glaucoma, left eye, moderate stage: Secondary | ICD-10-CM | POA: Diagnosis not present

## 2015-04-01 DIAGNOSIS — H401111 Primary open-angle glaucoma, right eye, mild stage: Secondary | ICD-10-CM | POA: Diagnosis not present

## 2015-04-20 ENCOUNTER — Encounter: Payer: Self-pay | Admitting: Internal Medicine

## 2015-04-20 ENCOUNTER — Ambulatory Visit (INDEPENDENT_AMBULATORY_CARE_PROVIDER_SITE_OTHER): Payer: Medicare Other | Admitting: Internal Medicine

## 2015-04-20 ENCOUNTER — Other Ambulatory Visit (INDEPENDENT_AMBULATORY_CARE_PROVIDER_SITE_OTHER): Payer: Medicare Other

## 2015-04-20 VITALS — BP 160/80 | HR 75 | Ht 59.0 in | Wt 144.2 lb

## 2015-04-20 DIAGNOSIS — E785 Hyperlipidemia, unspecified: Secondary | ICD-10-CM

## 2015-04-20 DIAGNOSIS — Z23 Encounter for immunization: Secondary | ICD-10-CM

## 2015-04-20 DIAGNOSIS — E1169 Type 2 diabetes mellitus with other specified complication: Secondary | ICD-10-CM

## 2015-04-20 DIAGNOSIS — E669 Obesity, unspecified: Secondary | ICD-10-CM

## 2015-04-20 DIAGNOSIS — E119 Type 2 diabetes mellitus without complications: Secondary | ICD-10-CM

## 2015-04-20 DIAGNOSIS — I10 Essential (primary) hypertension: Secondary | ICD-10-CM

## 2015-04-20 DIAGNOSIS — J4520 Mild intermittent asthma, uncomplicated: Secondary | ICD-10-CM

## 2015-04-20 LAB — LIPID PANEL
Cholesterol: 266 mg/dL — ABNORMAL HIGH (ref 0–200)
HDL: 45.7 mg/dL (ref >39.00)
NONHDL: 220.63
Total CHOL/HDL Ratio: 6
Triglycerides: 219 mg/dL — ABNORMAL HIGH (ref 0.0–149.0)
VLDL: 43.8 mg/dL — ABNORMAL HIGH (ref 0.0–40.0)

## 2015-04-20 LAB — BASIC METABOLIC PANEL
BUN: 29 mg/dL — ABNORMAL HIGH (ref 6–23)
CALCIUM: 9.6 mg/dL (ref 8.4–10.5)
CHLORIDE: 103 meq/L (ref 96–112)
CO2: 28 meq/L (ref 19–32)
CREATININE: 0.89 mg/dL (ref 0.40–1.20)
GFR: 63.66 mL/min (ref >60.00)
GLUCOSE: 149 mg/dL — AB (ref 70–99)
Potassium: 4.1 mEq/L (ref 3.5–5.1)
SODIUM: 139 meq/L (ref 135–145)

## 2015-04-20 LAB — HEPATIC FUNCTION PANEL
ALBUMIN: 4.1 g/dL (ref 3.5–5.2)
ALK PHOS: 37 U/L — AB (ref 39–117)
ALT: 10 U/L (ref 0–35)
AST: 13 U/L (ref 0–37)
BILIRUBIN DIRECT: 0.1 mg/dL (ref 0.0–0.3)
TOTAL PROTEIN: 6.9 g/dL (ref 6.0–8.3)
Total Bilirubin: 0.4 mg/dL (ref 0.2–1.2)

## 2015-04-20 LAB — LDL CHOLESTEROL, DIRECT: LDL DIRECT: 141 mg/dL

## 2015-04-20 LAB — HEMOGLOBIN A1C: HEMOGLOBIN A1C: 7.5 % — AB (ref 4.6–6.5)

## 2015-04-20 NOTE — Progress Notes (Signed)
Subjective:     Patient ID: Mikayla [name], female   DOB: 1925-11-30     MRN: [id number]  Brief patient profile:  54 yowf never smoker with moderate obesity> adult onset diabetes dx 2002 and hyperlipidemia and asthma    History of Present Illness   12/29/2014 f/u ov/Katanya Schlie re:  Dm/ hb/ asthma / following med calendar well Chief Complaint  Patient presents with  . Follow-up    Pt states that latest BP readings have been stable recently. Pt states non-productive cough with wheezing x 7 days   Not limited by breathing from desired activities  But more by fatigue/ sleeping poorly rec Please remember to go to the lab  department downstairs for your tests - we will call you with the results when they are available. Let your eye doctor know you will need a substitute for the timolol  Dr Prudencio Burly    04/20/2015  f/u ov/Kellie Chisolm re: aodm/ ab / hyperlipidemia Chief Complaint  Patient presents with  . Follow-up    pt here for 3 month f/u. pt staes she is doing well. no concerns at this time.   not needing symbicort lately so hasn't picked up the generic betoptic yet  Not limited by breathing from desired activities     No chronic cough or cp or chest tightness, subjective wheeze overt sinus or hb symptoms. No unusual exp hx or h/o childhood pna/ asthma or knowledge of premature birth.  Sleeping ok without nocturnal  or early am exacerbation  of respiratory  c/o's or need for noct saba. Also denies any obvious fluctuation of symptoms with weather or environmental changes or other aggravating or alleviating factors except as outlined above   Current Medications, Allergies, Complete Past Medical History, Past Surgical History, Family History, and Social History were reviewed in [employer] [facility] record.  ROS  The following are not active complaints unless bolded sore throat, dysphagia, dental problems, itching, sneezing,  nasal congestion or excess/ purulent secretions, ear ache,    fever, chills, sweats, unintended wt loss, pleuritic or exertional cp, hemoptysis,  orthopnea pnd or leg swelling, presyncope, palpitations, heartburn, abdominal pain, anorexia, nausea, vomiting, diarrhea  or change in bowel or urinary habits, change in stools or urine, dysuria,hematuria,  rash, arthralgias, visual complaints, headache, numbness weakness or ataxia or problems with walking or coordination,  change in mood/affect or memory.                   Past Medical History:  ESSENTIAL HYPERTENSION, BENIGN (ICD-401.1)  Glaucoma................................................................................[name]/[name]  HYPERLIPIDEMIA (ICD-272.4)  - target LDL less than 70 because of diabetes  -Zocor 20mg  every other day at bedtime April 14, 2010>>> LDL 110 July 06, 2010 and no recurrent  cramps  RHINITIS (ICD-472.0)  Intermittent asthma  - exac by Timolol but tolerates unless uri/flare  OSTEOPENIA (ICD-733.90)  - DEXA 12/28/07 PA Spine .655, L Hip -1.033, Lfem Neck -1.793  - DEXA 02/23/10 1.7 L Fem -1.9 Right Fem -1.8  OBESITY (ICD-278.00)  - Target wt = 148 for BMI < 30  AODM (ICD-250.00)  Health Maintenance.........................................................................Marland KitchenWert  - Td 03/2007 - Pneumovax 04/2013 /  prevnar 03/03/14 - CPX 03/03/2014  - GYN Health Maint : G[name], declines mammography and colonoscopy  --med calendar 06/04/2012 , 08/28/2013 , 04/20/14        Objective:   Physical Exam   amb elderly somber  wf nad   wt 159 November 05, 2008 >162 April 13, 2010 > 159 July 06, 2010 > 151 10/11/2010 >150 12/01/2010 > 150 01/11/2011 > 04/27/2011  148> 148 07/31/2011 > 10/30/2011  148 > 01/31/2012  147 >143 06/04/2012 > 09/23/2012  149> 01/01/13  142 > 04/03/2013  140> 03/03/2014 141 >145 03/17/2014 >>142 04/20/2014 > 05/11/2014 >141 >141 06/11/14 > 09/22/2014 142 > 12/29/2014 139 > 04/20/2015  144   HEENT: nl dentition, turbinates, and orophanx. Nl external  ear canals without cough reflex  NECK : without JVD/Nodes/TM/ nl carotid upstrokes bilaterally  LUNGS: no acc muscle, CTA  CV: RRR no s3 or murmur or increase in P2, no edema  ABD: soft and nontender with nl excursion in the supine position. No bruits or organomegaly, bowel sounds nl  MS: nl gait, warm without deformities, calf tenderness, cyanosis or clubbing,    Skin: no lesions Neuro:  Alert/ no motor or cerebellar def/ nl gait         Labs ordered/ reviewed:      Chemistry      Component Value Date/Time   NA 139 04/20/2015 1020   K 4.1 04/20/2015 1020   CL 103 04/20/2015 1020   CO2 28 04/20/2015 1020   BUN 29* 04/20/2015 1020   CREATININE 0.89 04/20/2015 1020      Component Value Date/Time   CALCIUM 9.6 04/20/2015 1020   ALKPHOS 37* 04/20/2015 1020   AST 13 04/20/2015 1020   ALT 10 04/20/2015 1020   BILITOT 0.4 04/20/2015 1020                 Lab Results  Component Value Date   CHOL 266* 04/20/2015   HDL 45.70 04/20/2015   LDLCALC 148* 08/28/2013   LDLDIRECT 141.0 04/20/2015   TRIG 219.0* 04/20/2015   CHOLHDL 6 04/20/2015                                            Assessment:

## 2015-04-20 NOTE — Patient Instructions (Addendum)
Please remember to go to the lab department downstairs for your tests - we will call you with the results when they are available.  Flu shot today   Please schedule a follow up visit in 4 months but call sooner if needed

## 2015-04-21 NOTE — Assessment & Plan Note (Signed)
-   Target LDL < 100 since has DM but poor statin tolerance   Not ideal but can't tol higher statin dose > no change rx for now

## 2015-04-21 NOTE — Assessment & Plan Note (Signed)
Lab Results  Component Value Date   HGBA1C 7.5* 04/20/2015   HGBA1C 7.2* 12/29/2014   HGBA1C 7.1* 09/22/2014     trending up slowly with lots of stress/ ? Compliant with diet / meds > no change rx for now but reinforced diet/ ex/ meds  I had an extended discussion with the patient reviewing all relevant studies completed to date and  lasting 15 to 20 minutes of a 25 minute visit    Each maintenance medication was reviewed in detail including most importantly the difference between maintenance and prns and under what circumstances the prns are to be triggered using an action plan format that is not reflected in the computer generated alphabetically organized AVS but trather by a customized med calendar that reflects the AVS meds with confirmed 100% correlation.   Please see instructions for details which were reviewed in writing and the patient given a copy highlighting the part that I personally wrote and discussed at today's ov.

## 2015-04-21 NOTE — Assessment & Plan Note (Signed)
Not ideal > rec avoid salt/ f/u conservatively for now given the stess she is under with husband's illness

## 2015-04-21 NOTE — Assessment & Plan Note (Signed)
No recent flares > uses symbicort "prn" and will get generic betoptic to have on hand for next flare to avoid the timolol exposure from her present eyedrops

## 2015-06-11 ENCOUNTER — Other Ambulatory Visit: Payer: Self-pay

## 2015-06-11 ENCOUNTER — Encounter: Payer: Self-pay | Admitting: Internal Medicine

## 2015-06-11 ENCOUNTER — Ambulatory Visit (INDEPENDENT_AMBULATORY_CARE_PROVIDER_SITE_OTHER): Payer: Medicare Other | Admitting: Internal Medicine

## 2015-06-11 ENCOUNTER — Telehealth: Payer: Self-pay | Admitting: Gastroenterology

## 2015-06-11 VITALS — BP 180/92 | HR 66 | Temp 97.0°F | Ht <= 58 in | Wt 141.8 lb

## 2015-06-11 DIAGNOSIS — J4520 Mild intermittent asthma, uncomplicated: Secondary | ICD-10-CM | POA: Diagnosis not present

## 2015-06-11 DIAGNOSIS — I10 Essential (primary) hypertension: Secondary | ICD-10-CM | POA: Diagnosis not present

## 2015-06-11 MED ORDER — OMEPRAZOLE 40 MG PO CPDR: 40.0000 mg | DELAYED_RELEASE_CAPSULE | Freq: Every day | ORAL | Status: DC

## 2015-06-11 MED ORDER — AZITHROMYCIN 250 MG PO TABS: ORAL_TABLET | ORAL | Status: DC

## 2015-06-11 NOTE — Patient Instructions (Signed)
zpak keep on hand if get worse  Work on inhaler technique:  relax and gently blow all the way out then take a nice smooth deep breath back in, triggering the inhaler at same time you start breathing in.  Hold for up to 5 seconds if you can. Blow out thru nose. Rinse and gargle with water when done  For cough delsym up to 2 tsp every 12 hours as needed

## 2015-06-11 NOTE — Progress Notes (Signed)
Subjective:     Patient ID: Mikayla [name], female   DOB: 09-13-1925     MRN: [id number]  Brief patient profile:  73 yowf never smoker with moderate obesity> adult onset diabetes dx 2002 and hyperlipidemia and asthma    History of Present Illness    06/11/2015 acute extended ov/[name] re: cough / no med calendar  Chief Complaint  Patient presents with  . Acute Visit    Pt c/o cough x 1 wk- occ prod with minimal clear sputum.   only used symbcort once or twice since onset/ still on timoptic   Felt the worst on 06/06/15 but persistent cough noct/ min need for otcs and no sob  No obvious day to day or daytime variability or assoc excess/ purulent sputum or mucus plugs   or cp or chest tightness, subjective wheeze or overt sinus or hb symptoms. No unusual exp hx or h/o childhood pna/ asthma or knowledge of premature birth.  Sleeping ok without nocturnal  or early am exacerbation  of respiratory  c/o's or need for noct saba. Also denies any obvious fluctuation of symptoms with weather or environmental changes or other aggravating or alleviating factors except as outlined above   Current Medications, Allergies, Complete Past Medical History, Past Surgical History, Family History, and Social History were reviewed in [facility] record.  ROS  The following are not active complaints unless bolded sore throat, dysphagia, dental problems, itching, sneezing,  nasal congestion or excess/ purulent secretions, ear ache,   fever, chills, sweats, unintended wt loss, classically pleuritic or exertional cp, hemoptysis,  orthopnea pnd or leg swelling, presyncope, palpitations, abdominal pain, anorexia, nausea, vomiting, diarrhea  or change in bowel or bladder habits, change in stools or urine, dysuria,hematuria,  rash, arthralgias, visual complaints, headache, numbness, weakness or ataxia or problems with walking or coordination,  change in mood/affect or memory.                    Past Medical History:  ESSENTIAL HYPERTENSION, BENIGN (ICD-401.1)  Glaucoma................................................................................[name]/[name]  HYPERLIPIDEMIA (ICD-272.4)  - target LDL less than 70 because of diabetes  -Zocor 20mg  every other day at bedtime April 14, 2010>>> LDL 110 July 06, 2010 and no recurrent  cramps  RHINITIS (ICD-472.0)  Intermittent asthma  - exac by Timolol but tolerates unless uri/flare  OSTEOPENIA (ICD-733.90)  - DEXA 12/28/07 PA Spine .655, L Hip -1.033, Lfem Neck -1.793  - DEXA 02/23/10 1.7 L Fem -1.9 Right Fem -1.8  OBESITY (ICD-278.00)  - Target wt = 148 for BMI < 30  AODM (ICD-250.00)  Health Maintenance.........................................................................Marland KitchenWert  - Td 03/2007 - Pneumovax 04/2013 /  prevnar 03/03/14 - CPX 03/03/2014  - GYN Health Maint : [name], declines mammography and colonoscopy  --med calendar 06/04/2012 , 08/28/2013 , 04/20/14        Objective:   Physical Exam   amb elderly somber  wf nad  wt 159 November 05, 2008 >162 April 13, 2010 > 159 July 06, 2010 > 151 10/11/2010 >150 12/01/2010 > 150 01/11/2011 > 04/27/2011  148> 148 07/31/2011 > 10/30/2011  148 > 01/31/2012  147 >143 06/04/2012 > 09/23/2012  149> 01/01/13  142 > 04/03/2013  140> 03/03/2014 141 >145 03/17/2014 >>142 04/20/2014 > 05/11/2014 >141 >141 06/11/14 > 09/22/2014 142 > 12/29/2014 139 > 04/20/2015  144 > 06/11/2015  142   HEENT: nl dentition, turbinates, and orophanx. Nl external ear canals without cough reflex  NECK : without JVD/Nodes/TM/ nl carotid upstrokes bilaterally  LUNGS: no acc muscle, minimal insp / exp rhonchi bilaterally  CV: RRR no s3 or murmur or increase in P2, no edema  ABD: soft and nontender with nl excursion in the supine position. No bruits or organomegaly, bowel sounds nl  MS: nl gait, warm without deformities, calf tenderness, cyanosis or clubbing,    Skin: no lesions Neuro:  Alert/ no motor  or cerebellar def/ nl gait          Assessment:

## 2015-06-11 NOTE — Telephone Encounter (Signed)
Pt aware med has been sent as requested

## 2015-06-12 ENCOUNTER — Encounter: Payer: Self-pay | Admitting: Internal Medicine

## 2015-06-12 NOTE — Assessment & Plan Note (Signed)
Reviewed approp use of symbicort and avoidance of timoptic/ add zpak prn purulent sputum, no need for pred at this point as relatively mild in setting of what is likely a resolving uri

## 2015-06-12 NOTE — Assessment & Plan Note (Signed)
Not  Adequate control on present rx, reviewed > no change in rx needed  Though with stress related to husband's dementia may need to add low dose BB like bisoprolol to offset (or amlodipine since already on cozar)  I had an extended discussion with the patient reviewing all relevant studies completed to date and  lasting 15 to 20 minutes of a 25 minute visit    Each maintenance medication was reviewed in detail including most importantly the difference between maintenance and prns and under what circumstances the prns are to be triggered using an action plan format that is not reflected in the computer generated alphabetically organized AVS.    Please see instructions for details which were reviewed in writing and the patient given a copy highlighting the part that I personally wrote and discussed at today's ov.

## 2015-06-17 ENCOUNTER — Other Ambulatory Visit: Payer: Self-pay

## 2015-06-17 MED ORDER — OMEPRAZOLE 40 MG PO CPDR: 40.0000 mg | DELAYED_RELEASE_CAPSULE | Freq: Every day | ORAL | Status: DC

## 2015-08-24 ENCOUNTER — Ambulatory Visit (INDEPENDENT_AMBULATORY_CARE_PROVIDER_SITE_OTHER): Payer: Medicare Other | Admitting: Internal Medicine

## 2015-08-24 ENCOUNTER — Encounter: Payer: Self-pay | Admitting: Internal Medicine

## 2015-08-24 ENCOUNTER — Other Ambulatory Visit (INDEPENDENT_AMBULATORY_CARE_PROVIDER_SITE_OTHER): Payer: Medicare Other

## 2015-08-24 VITALS — BP 156/78 | HR 67 | Ht <= 58 in | Wt 143.2 lb

## 2015-08-24 DIAGNOSIS — E119 Type 2 diabetes mellitus without complications: Secondary | ICD-10-CM | POA: Diagnosis not present

## 2015-08-24 DIAGNOSIS — I10 Essential (primary) hypertension: Secondary | ICD-10-CM

## 2015-08-24 DIAGNOSIS — E785 Hyperlipidemia, unspecified: Secondary | ICD-10-CM | POA: Diagnosis not present

## 2015-08-24 DIAGNOSIS — E669 Obesity, unspecified: Secondary | ICD-10-CM | POA: Diagnosis not present

## 2015-08-24 DIAGNOSIS — E1169 Type 2 diabetes mellitus with other specified complication: Secondary | ICD-10-CM

## 2015-08-24 DIAGNOSIS — J4520 Mild intermittent asthma, uncomplicated: Secondary | ICD-10-CM

## 2015-08-24 LAB — LIPID PANEL
CHOL/HDL RATIO: 5
CHOLESTEROL: 257 mg/dL — AB (ref 0–200)
HDL: 49.9 mg/dL (ref >39.00)
LDL CALC: 169 mg/dL — AB (ref 0–99)
NonHDL: 206.83
TRIGLYCERIDES: 188 mg/dL — AB (ref 0.0–149.0)
VLDL: 37.6 mg/dL (ref 0.0–40.0)

## 2015-08-24 LAB — HEMOGLOBIN A1C: Hgb A1c MFr Bld: 7 % — ABNORMAL HIGH (ref 4.6–6.5)

## 2015-08-24 LAB — BASIC METABOLIC PANEL
BUN: 34 mg/dL — AB (ref 6–23)
CALCIUM: 9.9 mg/dL (ref 8.4–10.5)
CHLORIDE: 101 meq/L (ref 96–112)
CO2: 30 mEq/L (ref 19–32)
CREATININE: 1.02 mg/dL (ref 0.40–1.20)
GFR: 54.35 mL/min — AB (ref >60.00)
Glucose, Bld: 146 mg/dL — ABNORMAL HIGH (ref 70–99)
Potassium: 5 mEq/L (ref 3.5–5.1)
Sodium: 137 mEq/L (ref 135–145)

## 2015-08-24 LAB — HEPATIC FUNCTION PANEL
ALT: 8 U/L (ref 0–35)
AST: 11 U/L (ref 0–37)
Albumin: 4.1 g/dL (ref 3.5–5.2)
Alkaline Phosphatase: 36 U/L — ABNORMAL LOW (ref 39–117)
BILIRUBIN DIRECT: 0.1 mg/dL (ref 0.0–0.3)
BILIRUBIN TOTAL: 0.5 mg/dL (ref 0.2–1.2)
Total Protein: 7 g/dL (ref 6.0–8.3)

## 2015-08-24 LAB — TSH: TSH: 3.5 u[IU]/mL (ref 0.35–4.50)

## 2015-08-24 MED ORDER — VALSARTAN 160 MG PO TABS: 160.0000 mg | ORAL_TABLET | Freq: Every day | ORAL | Status: DC

## 2015-08-24 NOTE — Progress Notes (Signed)
Quick Note:  Spoke with pt and notified of results per Dr. Melvyn Novas. Pt verbalized understanding and denied any questions. She admits to not taking zocor, and will start taking this again and will recheck at next ov ______

## 2015-08-24 NOTE — Progress Notes (Signed)
Subjective:     Patient ID: Mikayla [name], female   DOB: 1925-08-06     MRN: [id number]  Brief patient profile:  27 yowf never smoker with moderate obesity> adult onset diabetes dx 2002 and hyperlipidemia and asthma    History of Present Illness    06/11/2015 acute extended ov/Loron [name] re: cough / no med calendar  Chief Complaint  Patient presents with  . Acute Visit    Pt c/o cough x 1 wk- occ prod with minimal clear sputum.   only used symbcort once or twice since onset/ still on timoptic  Felt the worst on 06/06/15 but persistent cough noct/ min need for otcs and no sob rec zpak   08/24/2015  f/u ov/Mikayla [name] re: hbp/ dm/hyperlipidemia/ mild asthma Chief Complaint  Patient presents with  . Follow-up    Feeling same. Took Z-pak since last ov. No cough.Denies sob.    Rarely needing symbicort   No obvious day to day or daytime variability or assoc excess/ purulent sputum or mucus plugs   or cp or chest tightness, subjective wheeze or overt sinus or hb symptoms. No unusual exp hx or h/o childhood pna/ asthma or knowledge of premature birth.  Sleeping ok without nocturnal  or early am exacerbation  of respiratory  c/o's or need for noct saba. Also denies any obvious fluctuation of symptoms with weather or environmental changes or other aggravating or alleviating factors except as outlined above   Current Medications, Allergies, Complete Past Medical History, Past Surgical History, Family History, and Social History were reviewed in [employer] record.  ROS  The following are not active complaints unless bolded sore throat, dysphagia, dental problems, itching, sneezing,  nasal congestion or excess/ purulent secretions, ear ache,   fever, chills, sweats, unintended wt loss, classically pleuritic or exertional cp, hemoptysis,  orthopnea pnd or leg swelling, presyncope, palpitations, abdominal pain, anorexia, nausea, vomiting, diarrhea  or change in bowel or bladder  habits, change in stools or urine, dysuria,hematuria,  rash, arthralgias, visual complaints, headache, numbness, weakness or ataxia or problems with walking or coordination,  change in mood/affect or memory.                   Past Medical History:  ESSENTIAL HYPERTENSION, BENIGN (ICD-401.1)  Glaucoma................................................................................[name]/[name]  HYPERLIPIDEMIA (ICD-272.4)  - target LDL less than 70 because of diabetes  -Zocor 20mg  every other day at bedtime April 14, 2010>>> LDL 110 July 06, 2010 and no recurrent  cramps  RHINITIS (ICD-472.0)  Intermittent asthma  - exac by Timolol but tolerates unless uri/flare  OSTEOPENIA (ICD-733.90)  - DEXA 12/28/07 PA Spine .655, L Hip -1.033, Lfem Neck -1.793  - DEXA 02/23/10 1.7 L Fem -1.9 Right Fem -1.8  OBESITY (ICD-278.00)  - Target wt = 148 for BMI < 30  AODM (ICD-250.00)  Health Maintenance.........................................................................Marland KitchenWert  - Td 03/2007 - Pneumovax 04/2013 /  prevnar 03/03/14 - CPX 03/03/2014  - GYN Health Maint : [name], declines mammography and colonoscopy  --med calendar 06/04/2012 , 08/28/2013 , 04/20/14        Objective:   Physical Exam   amb elderly somber  wf nad/ vital signs reviewed  wt 159 November 05, 2008 >162 April 13, 2010 > 159 July 06, 2010 > 151 10/11/2010 >150 12/01/2010 > 150 01/11/2011 > 04/27/2011  148> 148 07/31/2011 > 10/30/2011  148 > 01/31/2012  147 >143 06/04/2012 > 09/23/2012  149> 01/01/13  142 > 04/03/2013  140> 03/03/2014 141 >145 03/17/2014 >>142  04/20/2014 > 05/11/2014 >141 >141 06/11/14 > 09/22/2014 142 > 12/29/2014 139 > 04/20/2015  144 > 06/11/2015  142 >  08/24/2015  143  HEENT: nl dentition, turbinates, and orophanx. Nl external ear canals without cough reflex  NECK : without JVD/Nodes/TM/ nl carotid upstrokes bilaterally  LUNGS: no acc muscle, clear to A and P  CV: RRR no s3 or murmur or increase in  P2, no edema  ABD: soft and nontender with nl excursion in the supine position. No bruits or organomegaly, bowel sounds nl  MS: nl gait, warm without deformities, calf tenderness, cyanosis or clubbing,    Skin: no lesions Neuro:  Alert/ no motor or cerebellar def/ nl gait     Labs ordered/ reviewed:      Chemistry      Component Value Date/Time   NA 137 08/24/2015 0954   K 5.0 08/24/2015 0954   CL 101 08/24/2015 0954   CO2 30 08/24/2015 0954   BUN 34* 08/24/2015 0954   CREATININE 1.02 08/24/2015 0954      Component Value Date/Time   CALCIUM 9.9 08/24/2015 0954   ALKPHOS 36* 08/24/2015 0954   AST 11 08/24/2015 0954   ALT 8 08/24/2015 0954   BILITOT 0.5 08/24/2015 0954        Lab Results  Component Value Date   WBC 6.6 09/22/2014   HGB 11.5* 09/22/2014   HCT 33.2* 09/22/2014   MCV 84.8 09/22/2014   PLT 201.0 09/22/2014       Lab Results  Component Value Date   TSH 3.50 08/24/2015     Lab Results  Component Value Date   HGBA1C 7.0* 08/24/2015   HGBA1C 7.5* 04/20/2015   HGBA1C 7.2* 12/29/2014             Assessment:

## 2015-08-24 NOTE — Patient Instructions (Addendum)
Diovan(valsartan) 160 mg daily in place of losartan  Please remember to go to the lab department downstairs for your tests - we will call you with the results when they are available.  See [name] at 3 month  with all your medications, even over the counter meds, separated in two separate bags, the ones you take no matter what vs the ones you stop once you feel better and take only as needed when you feel you need them.   Tammy  will generate for you a new user friendly medication calendar that will put [place] all on the same page re: your medication use.

## 2015-08-26 NOTE — Assessment & Plan Note (Signed)
Adequate control on present rx, reviewed > no change in rx needed  = glucophage

## 2015-08-26 NOTE — Assessment & Plan Note (Addendum)
Not Adequate control on present rx, reviewed > change to diovan 160 mg daily   I had an extended discussion with the patient reviewing all relevant studies completed to date and  lasting 15 to 20 minutes of a 25 minute visit    Each maintenance medication was reviewed in detail including most importantly the difference between maintenance and prns and under what circumstances the prns are to be triggered using an action plan format that is not reflected in the computer generated alphabetically organized AVS but trather by a customized med calendar that reflects the AVS meds with confirmed 100% correlation.   Please see instructions for details which were reviewed in writing and the patient given a copy highlighting the part that I personally wrote and discussed at today's ov.

## 2015-08-26 NOTE — Assessment & Plan Note (Addendum)
-   Target LDL < 100 since has DM but poor statin tolerance   Lab Results  Component Value Date   CHOL 257* 08/24/2015   HDL 49.90 08/24/2015   LDLCALC 169* 08/24/2015   LDLDIRECT 141.0 04/20/2015   TRIG 188.0* 08/24/2015   CHOLHDL 5 08/24/2015    rec challenge with zocor 40 or change to crestor 10

## 2015-08-26 NOTE — Assessment & Plan Note (Signed)
Adequate control on present rx, reviewed > no change in rx needed  = prn symbicort

## 2015-09-06 ENCOUNTER — Telehealth: Payer: Self-pay | Admitting: Internal Medicine

## 2015-09-06 NOTE — Telephone Encounter (Signed)
Spoke with pt. States that when she was here last MW changed her BP medication >> losartan to valsartan 160mg . Pt complains of being very lethargic since medication change. She does not have a way of checking her BP at home. Would like MW's recommendations.  Patient Instructions     Diovan(valsartan) 160 mg daily in place of losartan  Please remember to go to the lab department downstairs for your tests - we will call you with the results when they are available.  See [name] at 3 month with all your medications, even over the counter meds, separated in two separate bags, the ones you take no matter what vs the ones you stop once you feel better and take only as needed when you feel you need them. Tammy will generate for you a new user friendly medication calendar that will put [place] all on the same page re: your medication use.     MW - please advise. Thanks.

## 2015-09-06 NOTE — Telephone Encounter (Signed)
Called and spoke to pt. Informed her of the recs per MW. Appt made with TP on 09/13/2015. Pt verbalized understanding and denied any further questions or concerns at this time.

## 2015-09-06 NOTE — Telephone Encounter (Signed)
Try cutting in half, f/u with np w/in a week

## 2015-09-13 ENCOUNTER — Ambulatory Visit: Payer: Medicare Other | Admitting: Adult Health

## 2015-09-14 ENCOUNTER — Ambulatory Visit (INDEPENDENT_AMBULATORY_CARE_PROVIDER_SITE_OTHER): Payer: Medicare Other | Admitting: Adult Health

## 2015-09-14 ENCOUNTER — Encounter: Payer: Self-pay | Admitting: Adult Health

## 2015-09-14 VITALS — BP 140/70 | HR 60 | Temp 97.0°F | Wt 145.0 lb

## 2015-09-14 DIAGNOSIS — E1169 Type 2 diabetes mellitus with other specified complication: Secondary | ICD-10-CM

## 2015-09-14 DIAGNOSIS — E785 Hyperlipidemia, unspecified: Secondary | ICD-10-CM | POA: Diagnosis not present

## 2015-09-14 DIAGNOSIS — I10 Essential (primary) hypertension: Secondary | ICD-10-CM | POA: Diagnosis not present

## 2015-09-14 MED ORDER — SIMVASTATIN 20 MG PO TABS: 20.0000 mg | ORAL_TABLET | Freq: Every day | ORAL | Status: DC

## 2015-09-14 NOTE — Progress Notes (Signed)
Subjective:     Patient ID: Mikayla [name], female   DOB: 06/11/1925     MRN: [id number]  Brief patient profile:  59 yowf never smoker with moderate obesity> adult onset diabetes dx 2002 and hyperlipidemia and asthma    History of Present Illness    06/11/2015 acute extended ov/[name] re: cough / no med calendar  Chief Complaint  Patient presents with  . Acute Visit    Pt c/o cough x 1 wk- occ prod with minimal clear sputum.   only used symbcort once or twice since onset/ still on timoptic  Felt the worst on 06/06/15 but persistent cough noct/ min need for otcs and no sob rec zpak   08/24/2015  f/u ov/[name] re: hbp/ dm/hyperlipidemia/ mild asthma Chief Complaint  Patient presents with  . Follow-up    Feeling same. Took Z-pak since last ov. No cough.Denies sob.    Rarely needing symbicort   09/14/2015 Follow up : HTN/DM /Hyperlipidemia /Mild Asthma  Pt returns for a 1 month follow up .  She says she is doing well.  Changed pt from losartan to valsartan last ov, was decreased to one half due increased lethargy. Once  decreased to 1/2 tab daily and pt feels fine now-  Denies ha/s , dizziness, sob Cholesterol was increased , she says she stopped zocor. Let things go after husband died in Feb.  Going to restart today , wants refills sent to pharmacy. She is trying to eat healthy.  She denies chest pain, orthopnea, edema or fever.    Current Medications, Allergies, Complete Past Medical History, Past Surgical History, Family History, and Social History were reviewed in [facility] record.  ROS  The following are not active complaints unless bolded sore throat, dysphagia, dental problems, itching, sneezing,  nasal congestion or excess/ purulent secretions, ear ache,   fever, chills, sweats, unintended wt loss, classically pleuritic or exertional cp, hemoptysis,  orthopnea pnd or leg swelling, presyncope, palpitations, abdominal pain, anorexia, nausea, vomiting,  diarrhea  or change in bowel or bladder habits, change in stools or urine, dysuria,hematuria,  rash, arthralgias, visual complaints, headache, numbness, weakness or ataxia or problems with walking or coordination,  change in mood/affect or memory.                   Past Medical History:  ESSENTIAL HYPERTENSION, BENIGN (ICD-401.1)  Glaucoma................................................................................[name]/[name]  HYPERLIPIDEMIA (ICD-272.4)  - target LDL less than 70 because of diabetes  -Zocor 20mg  every other day at bedtime April 14, 2010>>> LDL 110 July 06, 2010 and no recurrent  cramps  RHINITIS (ICD-472.0)  Intermittent asthma  - exac by Timolol but tolerates unless uri/flare  OSTEOPENIA (ICD-733.90)  - DEXA 12/28/07 PA Spine .655, L Hip -1.033, Lfem Neck -1.793  - DEXA 02/23/10 1.7 L Fem -1.9 Right Fem -1.8  OBESITY (ICD-278.00)  - Target wt = 148 for BMI < 30  AODM (ICD-250.00)  Health Maintenance.........................................................................Marland KitchenWert  - Td 03/2007 - Pneumovax 04/2013 /  prevnar 03/03/14 - CPX 03/03/2014  - GYN Health Maint : [name], declines mammography and colonoscopy  --med calendar 06/04/2012 , 08/28/2013 , 04/20/14        Objective:   Physical Exam   amb elderly somber  wf nad/ vital signs reviewed  wt 159 November 05, 2008 >162 April 13, 2010 > 159 July 06, 2010 > 151 10/11/2010 >150 12/01/2010 > 150 01/11/2011 > 04/27/2011  148> 148 07/31/2011 > 10/30/2011  148 > 01/31/2012  147 >143  06/04/2012 > 09/23/2012  149> 01/01/13  142 > 04/03/2013  140> 03/03/2014 141 >145 03/17/2014 >>142 04/20/2014 > 05/11/2014 >141 >141 06/11/14 > 09/22/2014 142 > 12/29/2014 139 > 04/20/2015  144 > 06/11/2015  142 >  08/24/2015  143   Filed Vitals:   09/14/15 1124  BP: 140/70  Pulse: 60  Temp: 97 F (36.1 C)  TempSrc: Oral  Weight: 145 lb (65.772 kg)  SpO2: 99%    HEENT: nl dentition, turbinates, and orophanx. Nl  external ear canals without cough reflex  NECK : without JVD/Nodes/TM/ nl carotid upstrokes bilaterally  LUNGS: no acc muscle, clear to A and P  CV: RRR no s3 or murmur or increase in P2, no edema  ABD: soft and nontender with nl excursion in the supine position. No bruits or organomegaly, bowel sounds nl  MS: nl gait, warm without deformities, calf tenderness, cyanosis or clubbing,    Skin: no lesions Neuro:  Alert/ no motor or cerebellar def/ nl gait     Labs ordered/ reviewed:      Chemistry      Component Value Date/Time   NA 137 08/24/2015 0954   K 5.0 08/24/2015 0954   CL 101 08/24/2015 0954   CO2 30 08/24/2015 0954   BUN 34* 08/24/2015 0954   CREATININE 1.02 08/24/2015 0954      Component Value Date/Time   CALCIUM 9.9 08/24/2015 0954   ALKPHOS 36* 08/24/2015 0954   AST 11 08/24/2015 0954   ALT 8 08/24/2015 0954   BILITOT 0.5 08/24/2015 0954        Lab Results  Component Value Date   WBC 6.6 09/22/2014   HGB 11.5* 09/22/2014   HCT 33.2* 09/22/2014   MCV 84.8 09/22/2014   PLT 201.0 09/22/2014       Lab Results  Component Value Date   TSH 3.50 08/24/2015     Lab Results  Component Value Date   HGBA1C 7.0* 08/24/2015   HGBA1C 7.5* 04/20/2015   HGBA1C 7.2* 12/29/2014             Assessment:

## 2015-09-14 NOTE — Progress Notes (Signed)
Chart and office note reviewed in detail  > agree with a/p as outlined    

## 2015-09-14 NOTE — Patient Instructions (Signed)
Low fat cholesterol diet. Restart Simvastatin 20mg  daily at bedtime .  Keep sweets low.  Follow up Dr. Melvyn Novas  In 3-4 months and As needed  With labs.  Please contact office for sooner follow up if symptoms do not improve or worsen or seek emergency care

## 2015-09-14 NOTE — Assessment & Plan Note (Signed)
Not at goal . Off meds , pt educaiton on med compliance.   Plan  Low fat cholesterol diet. Restart Simvastatin 20mg  daily at bedtime .  Keep sweets low.  Follow up Dr. Melvyn Novas  In 3-4 months and As needed  With labs.  Please contact office for sooner follow up if symptoms do not improve or worsen or seek emergency care

## 2015-09-14 NOTE — Assessment & Plan Note (Signed)
Improved control on rx   Plan Cont on current regimen  Low salt diet .  Follow up Dr. Melvyn Novas  In 3-4 months and As needed  With labs.  Please contact office for sooner follow up if symptoms do not improve or worsen or seek emergency care

## 2015-10-07 DIAGNOSIS — H401111 Primary open-angle glaucoma, right eye, mild stage: Secondary | ICD-10-CM | POA: Diagnosis not present

## 2015-10-12 DIAGNOSIS — H348312 Tributary (branch) retinal vein occlusion, right eye, stable: Secondary | ICD-10-CM | POA: Diagnosis not present

## 2015-10-12 DIAGNOSIS — H353212 Exudative age-related macular degeneration, right eye, with inactive choroidal neovascularization: Secondary | ICD-10-CM | POA: Diagnosis not present

## 2015-10-12 DIAGNOSIS — H353122 Nonexudative age-related macular degeneration, left eye, intermediate dry stage: Secondary | ICD-10-CM | POA: Diagnosis not present

## 2015-11-23 ENCOUNTER — Ambulatory Visit (INDEPENDENT_AMBULATORY_CARE_PROVIDER_SITE_OTHER): Payer: Medicare Other | Admitting: Adult Health

## 2015-11-23 ENCOUNTER — Encounter: Payer: Self-pay | Admitting: Adult Health

## 2015-11-23 VITALS — BP 130/74 | HR 61 | Temp 98.3°F | Ht 60.0 in | Wt 144.0 lb

## 2015-11-23 DIAGNOSIS — E785 Hyperlipidemia, unspecified: Secondary | ICD-10-CM

## 2015-11-23 DIAGNOSIS — E1169 Type 2 diabetes mellitus with other specified complication: Secondary | ICD-10-CM

## 2015-11-23 DIAGNOSIS — J4520 Mild intermittent asthma, uncomplicated: Secondary | ICD-10-CM | POA: Diagnosis not present

## 2015-11-23 NOTE — Progress Notes (Signed)
Chart and office note reviewed in detail  > agree with a/p as outlined    

## 2015-11-23 NOTE — Assessment & Plan Note (Signed)
Restarted Zocor in May  Not fasting today , will get lipid and lft on return   Plan  Low fat cholesterol diet. Continue Simvastatin 20mg  daily at bedtime .  Keep sweets low.  Follow up Dr. Melvyn Novas  In 3-4 months and As needed  With labs.  Please contact office for sooner follow up if symptoms do not improve or worsen or seek emergency care  Follow med calendar closely and bring to each visit

## 2015-11-23 NOTE — Progress Notes (Signed)
Subjective:     Patient ID: Mikayla [name], female   DOB: [birth year]     MRN: [id number]  Brief patient profile:  58 yowf never smoker with moderate obesity> adult onset diabetes dx 2002 and hyperlipidemia and asthma  History of Present Illness    06/11/2015 acute extended ov/[name] re: cough / no med calendar  Chief Complaint  Patient presents with  . Acute Visit    Pt c/o cough x 1 wk- occ prod with minimal clear sputum.   only used symbcort once or twice since onset/ still on timoptic  Felt the worst on 06/06/15 but persistent cough noct/ min need for otcs and no sob rec zpak   08/24/2015  f/u ov/[name] re: hbp/ dm/hyperlipidemia/ mild asthma Chief Complaint  Patient presents with  . Follow-up    Feeling same. Took Z-pak since last ov. No cough.Denies sob.    Rarely needing symbicort   09/14/2015 Follow up : HTN/DM /Hyperlipidemia /Mild Asthma  Pt returns for a 1 month follow up .  She says she is doing well.  Changed pt from losartan to valsartan last ov, was decreased to one half due increased lethargy. Once  decreased to 1/2 tab daily and pt feels fine now-  Denies ha/s , dizziness, sob Cholesterol was increased , she says she stopped zocor. Let things go after husband died in Feb.  Going to restart today , wants refills sent to pharmacy. She is trying to eat healthy.  She denies chest pain, orthopnea, edema or fever.  >>restart zocor    11/23/2015 Follow up : HTN /DM /Hyperlipdemia /Mild Asthma  Pt returns for 2 month follow up .  Marland Kitchen Pt states breathing is doing well and has no new complaint at this time.  Restarted on Zocor last ov for elevated cholesterol .  Says she is doing well.  Eating healthy.  B/p is well controlled on Diovan. Says she tolerates this better.  Denies chest pain, orthopnea, edema or fever.   We reviewed all her meds and organized them into a med calendar with pt education.  Appears to be taking correctly.   Current Medications, Allergies,  Complete Past Medical History, Past Surgical History, Family History, and Social History were reviewed in [employer] record.  ROS  The following are not active complaints unless bolded sore throat, dysphagia, dental problems, itching, sneezing,  nasal congestion or excess/ purulent secretions, ear ache,   fever, chills, sweats, unintended wt loss, classically pleuritic or exertional cp, hemoptysis,  orthopnea pnd or leg swelling, presyncope, palpitations, abdominal pain, anorexia, nausea, vomiting, diarrhea  or change in bowel or bladder habits, change in stools or urine, dysuria,hematuria,  rash, arthralgias, visual complaints, headache, numbness, weakness or ataxia or problems with walking or coordination,  change in mood/affect or memory.          Past Medical History:  ESSENTIAL HYPERTENSION, BENIGN (ICD-401.1)  Glaucoma................................................................................[name]/[name]  HYPERLIPIDEMIA (ICD-272.4)  - target LDL less than 70 because of diabetes  -Zocor 20mg  every other day at bedtime April 14, 2010>>> LDL 110 July 06, 2010 and no recurrent  cramps  RHINITIS (ICD-472.0)  Intermittent asthma  - exac by Timolol but tolerates unless uri/flare  OSTEOPENIA (ICD-733.90)  - DEXA 12/28/07 PA Spine .655, L Hip -1.033, Lfem Neck -1.793  - DEXA 02/23/10 1.7 L Fem -1.9 Right Fem -1.8  OBESITY (ICD-278.00)  - Target wt = 148 for BMI < 30  AODM (ICD-250.00)  Health Maintenance.........................................................................Marland KitchenWert  - Td 03/2007 -  Pneumovax 04/2013 /  prevnar 03/03/14 - CPX 03/03/2014  - GYN Health Maint : G[name], declines mammography and colonoscopy  --med calendar 06/04/2012 , 08/28/2013 , 04/20/14 , 11/23/2015        Objective:   Physical Exam amb elderly somber  wf nad/ vital signs reviewed   wt 159 November 05, 2008 >162 April 13, 2010 > 159 July 06, 2010 > 151  10/11/2010 >150 12/01/2010 > 150 01/11/2011 > 04/27/2011  148> 148 07/31/2011 > 10/30/2011  148 > 01/31/2012  147 >143 06/04/2012 > 09/23/2012  149> 01/01/13  142 > 04/03/2013  140> 03/03/2014 141 >145 03/17/2014 >>142 04/20/2014 > 05/11/2014 >141 >141 06/11/14 > 09/22/2014 142 > 12/29/2014 139 > 04/20/2015  144 > 06/11/2015  142 >  08/24/2015  143  Filed Vitals:   11/23/15 0942  BP: 130/74  Pulse: 61  Temp: 98.3 F (36.8 C)  TempSrc: Oral  Height: 5' (1.524 m)  Weight: 144 lb (65.318 kg)  SpO2: 98%   \   HEENT: nl dentition, turbinates, and orophanx. Nl external ear canals without cough reflex  NECK : without JVD/Nodes/TM/ nl carotid upstrokes bilaterally  LUNGS: no acc muscle, clear to A and P  CV: RRR no s3 or murmur or increase in P2, no edema  ABD: soft and nontender with nl excursion in the supine position. No bruits or organomegaly, bowel sounds nl  MS: nl gait, warm without deformities, calf tenderness, cyanosis or clubbing,    Skin: no lesions Neuro:  Alert/ no motor or cerebellar def/ nl gait     Labs ordered/ reviewed:      Chemistry      Component Value Date/Time   NA 137 08/24/2015 0954   K 5.0 08/24/2015 0954   CL 101 08/24/2015 0954   CO2 30 08/24/2015 0954   BUN 34* 08/24/2015 0954   CREATININE 1.02 08/24/2015 0954      Component Value Date/Time   CALCIUM 9.9 08/24/2015 0954   ALKPHOS 36* 08/24/2015 0954   AST 11 08/24/2015 0954   ALT 8 08/24/2015 0954   BILITOT 0.5 08/24/2015 0954        Lab Results  Component Value Date   WBC 6.6 09/22/2014   HGB 11.5* 09/22/2014   HCT 33.2* 09/22/2014   MCV 84.8 09/22/2014   PLT 201.0 09/22/2014       Lab Results  Component Value Date   TSH 3.50 08/24/2015     Lab Results  Component Value Date   HGBA1C 7.0* 08/24/2015   HGBA1C 7.5* 04/20/2015   HGBA1C 7.2* 12/29/2014

## 2015-11-23 NOTE — Assessment & Plan Note (Signed)
Doing well  Patient's medications were reviewed today and patient education was given. Computerized medication calendar was adjusted/completed   Plan  Follow up Dr. Melvyn Novas  In 3-4 months and As needed  With labs.  Please contact office for sooner follow up if symptoms do not improve or worsen or seek emergency care  Follow med calendar closely and bring to each visit

## 2015-11-23 NOTE — Patient Instructions (Addendum)
Low fat cholesterol diet. Continue Simvastatin 20mg  daily at bedtime .  Keep sweets low.  Follow up Dr. Melvyn Novas  In 3-4 months and As needed  With labs.  Please contact office for sooner follow up if symptoms do not improve or worsen or seek emergency care  Follow med calendar closely and bring to each visit

## 2015-11-26 NOTE — Addendum Note (Signed)
Addended by: Osa Craver on: 11/26/2015 04:56 PM   Modules accepted: Medications

## 2016-01-18 ENCOUNTER — Ambulatory Visit: Payer: Medicare Other | Admitting: Internal Medicine

## 2016-01-24 ENCOUNTER — Other Ambulatory Visit: Payer: Self-pay | Admitting: Internal Medicine

## 2016-02-01 ENCOUNTER — Encounter: Payer: Self-pay | Admitting: Internal Medicine

## 2016-02-01 ENCOUNTER — Ambulatory Visit (INDEPENDENT_AMBULATORY_CARE_PROVIDER_SITE_OTHER): Payer: Medicare Other | Admitting: Internal Medicine

## 2016-02-01 ENCOUNTER — Other Ambulatory Visit (INDEPENDENT_AMBULATORY_CARE_PROVIDER_SITE_OTHER): Payer: Medicare Other

## 2016-02-01 VITALS — BP 148/78 | HR 67 | Ht 60.0 in | Wt 143.6 lb

## 2016-02-01 DIAGNOSIS — E669 Obesity, unspecified: Secondary | ICD-10-CM | POA: Diagnosis not present

## 2016-02-01 DIAGNOSIS — E119 Type 2 diabetes mellitus without complications: Secondary | ICD-10-CM

## 2016-02-01 DIAGNOSIS — J4520 Mild intermittent asthma, uncomplicated: Secondary | ICD-10-CM | POA: Diagnosis not present

## 2016-02-01 DIAGNOSIS — E1169 Type 2 diabetes mellitus with other specified complication: Secondary | ICD-10-CM

## 2016-02-01 DIAGNOSIS — E785 Hyperlipidemia, unspecified: Secondary | ICD-10-CM

## 2016-02-01 DIAGNOSIS — Z23 Encounter for immunization: Secondary | ICD-10-CM | POA: Diagnosis not present

## 2016-02-01 DIAGNOSIS — I10 Essential (primary) hypertension: Secondary | ICD-10-CM

## 2016-02-01 LAB — CBC WITH DIFFERENTIAL/PLATELET
BASOS ABS: 0 10*3/uL (ref 0.0–0.1)
Basophils Relative: 0.2 % (ref 0.0–3.0)
EOS ABS: 0.2 10*3/uL (ref 0.0–0.7)
Eosinophils Relative: 3.8 % (ref 0.0–5.0)
HCT: 33.4 % — ABNORMAL LOW (ref 36.0–46.0)
Hemoglobin: 11.5 g/dL — ABNORMAL LOW (ref 12.0–15.0)
LYMPHS ABS: 1.6 10*3/uL (ref 0.7–4.0)
Lymphocytes Relative: 25.2 % (ref 12.0–46.0)
MCHC: 34.3 g/dL (ref 30.0–36.0)
MCV: 87.6 fl (ref 78.0–100.0)
MONO ABS: 0.4 10*3/uL (ref 0.1–1.0)
MONOS PCT: 6.9 % (ref 3.0–12.0)
NEUTROS ABS: 4.1 10*3/uL (ref 1.4–7.7)
NEUTROS PCT: 63.9 % (ref 43.0–77.0)
PLATELETS: 198 10*3/uL (ref 150.0–400.0)
RBC: 3.82 Mil/uL — ABNORMAL LOW (ref 3.87–5.11)
RDW: 13.7 % (ref 11.5–15.5)
WBC: 6.4 10*3/uL (ref 4.0–10.5)

## 2016-02-01 LAB — BASIC METABOLIC PANEL WITH GFR
BUN: 23 mg/dL (ref 6–23)
CO2: 30 meq/L (ref 19–32)
Calcium: 9.3 mg/dL (ref 8.4–10.5)
Chloride: 103 meq/L (ref 96–112)
Creatinine, Ser: 0.92 mg/dL (ref 0.40–1.20)
GFR: 61.16 mL/min
Glucose, Bld: 124 mg/dL — ABNORMAL HIGH (ref 70–99)
Potassium: 5.1 meq/L (ref 3.5–5.1)
Sodium: 138 meq/L (ref 135–145)

## 2016-02-01 LAB — HEPATIC FUNCTION PANEL
ALBUMIN: 4 g/dL (ref 3.5–5.2)
ALT: 11 U/L (ref 0–35)
AST: 11 U/L (ref 0–37)
Alkaline Phosphatase: 34 U/L — ABNORMAL LOW (ref 39–117)
BILIRUBIN TOTAL: 0.5 mg/dL (ref 0.2–1.2)
Bilirubin, Direct: 0 mg/dL (ref 0.0–0.3)
Total Protein: 6.8 g/dL (ref 6.0–8.3)

## 2016-02-01 LAB — LIPID PANEL
CHOL/HDL RATIO: 5
CHOLESTEROL: 213 mg/dL — AB (ref 0–200)
HDL: 43.3 mg/dL (ref >39.00)

## 2016-02-01 LAB — TSH: TSH: 5.23 u[IU]/mL — ABNORMAL HIGH (ref 0.35–4.50)

## 2016-02-01 LAB — HEMOGLOBIN A1C: Hgb A1c MFr Bld: 6.6 % — ABNORMAL HIGH (ref 4.6–6.5)

## 2016-02-01 LAB — LDL CHOLESTEROL, DIRECT: LDL DIRECT: 92 mg/dL

## 2016-02-01 NOTE — Patient Instructions (Signed)
Please remember to go to the lab department downstairs for your tests - we will call you with the results when they are available.  See calendar for specific medication instructions and bring it back for each and every office visit for every healthcare provider you see.  Without it,  you may not receive the best quality medical care that we feel you deserve.  You will note that the calendar groups together  your maintenance  medications that are timed at particular times of the day.  Think of this as your checklist for what your doctor has instructed you to do until your next evaluation to see what benefit  there is  to staying on a consistent group of medications intended to keep you well.  The other group at the bottom is entirely up to you to use as you see fit  for specific symptoms that may arise between visits that require you to treat them on an as needed basis.  Think of this as your action plan or "what if" list.   Separating the top medications from the bottom group is fundamental to providing you adequate care going forward.    Please schedule a follow up visit in 3 months but call sooner if needed     

## 2016-02-01 NOTE — Progress Notes (Signed)
Subjective:     Patient ID: Mikayla [name], female   DOB: 02/20/1926     MRN: [id number]  Brief patient profile:  80 yowf never smoker with moderate obesity> adult onset diabetes dx 2002 and hyperlipidemia and asthma     History of Present Illness  06/11/2015 acute extended ov/Mikayla [name] re: cough / no med calendar  Chief Complaint  Patient presents with  . Acute Visit    Pt c/o cough x 1 wk- occ prod with minimal clear sputum.   only used symbcort once or twice since onset/ still on timoptic  Felt the worst on 06/06/15 but persistent cough noct/ min need for otcs and no sob rec zpak   08/24/2015  f/u ov/Mikayla [name] re: hbp/ dm/hyperlipidemia/ mild asthma Chief Complaint  Patient presents with  . Follow-up    Feeling same. Took Z-pak since last ov. No cough.Denies sob.   Rarely needing symbicort  rec Diovan(valsartan) 160 mg daily in place of losartan   09/14/2015 NP Follow up : HTN/DM /Hyperlipidemia /Mild Asthma  Pt returns for a 1 month follow up .  She says she is doing well.  Changed pt from losartan to valsartan last ov, was decreased to one half due increased lethargy. Once  decreased to 1/2 tab daily and pt feels fine now-  Denies ha/s , dizziness, sob Cholesterol was increased , she says she stopped zocor. Let things go after husband died in Feb.  Going to restart today , wants refills sent to pharmacy. She is trying to eat healthy.  She denies chest pain, orthopnea, edema or fever.  >>restart zocor 20 mg daily    11/23/2015 NP  Follow up : HTN /DM /Hyperlipdemia /Mild Asthma  Pt returns for 2 month follow up .  Marland Kitchen Pt states breathing is doing well and has no new complaint at this time.  Restarted on Zocor last ov for elevated cholesterol .  Says she is doing well.  Eating healthy.  B/p is well controlled on Diovan. Says she tolerates this better.  rec Low fat cholesterol diet. Continue Simvastatin 20mg  daily at bedtime .  Keep sweets low.     02/01/2016  f/u ov/Mikayla [name] re:  HTN /DM Mikayla [name] /Mild Asthma  Chief Complaint  Patient presents with  . Follow-up    Pt states that she is doing well. No complaints or concerns.   ex = gardening / no hypo or hyperglycemia symptoms and she declines to check her cbgs  No cp/ tia/claudication. No  Cough or chest tightness, subjective wheeze or overt sinus or hb symptoms. No unusual exp hx or h/o childhood pna/ asthma or knowledge of premature birth.  Sleeping ok without nocturnal  or early am exacerbation  of respiratory  c/o's or need for noct saba. Also denies any obvious fluctuation of symptoms with weather or environmental changes or other aggravating or alleviating factors except as outlined above   Current Medications, Allergies, Complete Past Medical History, Past Surgical History, Family History, and Social History were reviewed in [employer] [facility] record.  ROS  The following are not active complaints unless bolded sore throat, dysphagia, dental problems, itching, sneezing,  nasal congestion or excess/ purulent secretions, ear ache,   fever, chills, sweats, unintended wt loss, classically pleuritic or exertional cp, hemoptysis,  orthopnea pnd or leg swelling, presyncope, palpitations, abdominal pain, anorexia, nausea, vomiting, diarrhea  or change in bowel or bladder habits, change in stools or urine, dysuria,hematuria,  rash, arthralgias, visual complaints, headache, numbness, weakness or  ataxia or problems with walking or coordination,  change in mood/affect or memory.                     Past Medical History:  ESSENTIAL HYPERTENSION, BENIGN (ICD-401.1)  Glaucoma................................................................................[name]/[name]  HYPERLIPIDEMIA (ICD-272.4)  - target LDL less than 70 because of diabetes  -Zocor 20mg  every other day at bedtime April 14, 2010>>> LDL 110 July 06, 2010 and no recurrent  cramps  RHINITIS (ICD-472.0)  Intermittent asthma   - exac by Timolol but tolerates unless uri/flare  OSTEOPENIA (ICD-733.90)  - DEXA 12/28/07 PA Spine .655, L Hip -1.033, Lfem Neck -1.793  - DEXA 02/23/10 1.7 L Fem -1.9 Right Fem -1.8  OBESITY (ICD-278.00)  - Target wt = 148 for BMI < 30  AODM (ICD-250.00)  Health Maintenance.........................................................................Marland KitchenWert  - Td 03/2007 - Pneumovax 04/2013 /  prevnar 03/03/14 - CPX 03/03/2014  - GYN Health Maint : [name], declines mammography and colonoscopy  --med calendar 06/04/2012 , 08/28/2013 , 04/20/14 , 11/23/2015        Objective:   Physical Exam amb elderly somber  wf nad/ vital signs reviewed   wt 159 November 05, 2008 >162 April 13, 2010 > 159 July 06, 2010 > 151 10/11/2010 >150 12/01/2010 > 150 01/11/2011 > 04/27/2011  148> 148 07/31/2011 > 10/30/2011  148 > 01/31/2012  147 >143 06/04/2012 > 09/23/2012  149> 01/01/13  142 > 04/03/2013  140> 03/03/2014 141 >145 03/17/2014 >>142 04/20/2014 > 05/11/2014 >141 >141 06/11/14 > 09/22/2014 142 > 12/29/2014 139 > 04/20/2015  144 > 06/11/2015  142 >  08/24/2015  143 > 02/01/2016   144     HEENT: nl dentition, turbinates, and orophanx. Nl external ear canals without cough reflex  NECK : without JVD/Nodes/TM/ nl carotid upstrokes bilaterally  LUNGS: no acc muscle, clear to A and P  CV: RRR no s3 or murmur or increase in P2, no edema  ABD: soft and nontender with nl excursion in the supine position. No bruits or organomegaly, bowel sounds nl  MS: nl gait, warm with djd changes both hands, limited flexion, no calf tenderness, cyanosis or clubbing,  d  Skin: no lesions Neuro:  Alert/ no motor or cerebellar def/ nl gait              Labs ordered/ reviewed:      Chemistry      Component Value Date/Time   NA 138 02/01/2016 1013   K 5.1 02/01/2016 1013   CL 103 02/01/2016 1013   CO2 30 02/01/2016 1013   BUN 23 02/01/2016 1013   CREATININE 0.92 02/01/2016 1013      Component Value Date/Time   CALCIUM 9.3  02/01/2016 1013   ALKPHOS 34 (L) 02/01/2016 1013   AST 11 02/01/2016 1013   ALT 11 02/01/2016 1013   BILITOT 0.5 02/01/2016 1013        Lab Results  Component Value Date   WBC 6.4 02/01/2016   HGB 11.5 (L) 02/01/2016   HCT 33.4 (L) 02/01/2016   MCV 87.6 02/01/2016   PLT 198.0 02/01/2016         Lab Results  Component Value Date   TSH 5.23 (H) 02/01/2016

## 2016-02-02 NOTE — Progress Notes (Signed)
Spoke with pt and notified of results per Dr. [name]. Pt verbalized understanding and denied any questions. 

## 2016-02-06 NOTE — Assessment & Plan Note (Signed)
All goals of chronic asthma control met including optimal function and elimination of symptoms with minimal need for rescue therapy.  Contingencies discussed in full including contacting this office immediately if not controlling the symptoms using the rule of two's.    

## 2016-02-06 NOTE — Assessment & Plan Note (Signed)
Lab Results  Component Value Date   HGBA1C 6.6 (H) 02/01/2016   HGBA1C 7.0 (H) 08/24/2015   HGBA1C 7.5 (H) 04/20/2015      Adequate control on present rx, reviewed > no change in rx needed

## 2016-02-06 NOTE — Assessment & Plan Note (Signed)
Adequate control on present rx, reviewed > no change in rx needed  = diovan 160 mg daily

## 2016-02-06 NOTE — Assessment & Plan Note (Signed)
-   Target LDL < 100 since has DM but poor statin tolerance    Lab Results  Component Value Date   CHOL 213 (H) 02/01/2016   HDL 43.30 02/01/2016   LDLCALC 169 (H) 08/24/2015   LDLDIRECT 92.0 02/01/2016   TRIG (H) 02/01/2016    410.0 Triglyceride is over 400; calculations on Lipids are invalid.   CHOLHDL 5 02/01/2016    Adequate control on present rx, reviewed > no change in rx needed  = zocor 20 mg daily  Note also tsh slt elevated and may contribute to hyperlipidemia > check next ov   I had an extended discussion with the patient reviewing all relevant studies completed to date and  lasting 15 to 20 minutes of a 25 minute visit    Each maintenance medication was reviewed in detail including most importantly the difference between maintenance and prns and under what circumstances the prns are to be triggered using an action plan format that is not reflected in the computer generated alphabetically organized AVS but trather by a customized med calendar that reflects the AVS meds with confirmed 100% correlation.   Please see instructions for details which were reviewed in writing and the patient given a copy highlighting the part that I personally wrote and discussed at today's ov.

## 2016-03-30 DIAGNOSIS — H401111 Primary open-angle glaucoma, right eye, mild stage: Secondary | ICD-10-CM | POA: Diagnosis not present

## 2016-03-30 DIAGNOSIS — H401122 Primary open-angle glaucoma, left eye, moderate stage: Secondary | ICD-10-CM | POA: Diagnosis not present

## 2016-03-30 DIAGNOSIS — Z961 Presence of intraocular lens: Secondary | ICD-10-CM | POA: Diagnosis not present

## 2016-03-30 DIAGNOSIS — H348312 Tributary (branch) retinal vein occlusion, right eye, stable: Secondary | ICD-10-CM | POA: Diagnosis not present

## 2016-04-18 DIAGNOSIS — H353121 Nonexudative age-related macular degeneration, left eye, early dry stage: Secondary | ICD-10-CM | POA: Diagnosis not present

## 2016-04-18 DIAGNOSIS — H353212 Exudative age-related macular degeneration, right eye, with inactive choroidal neovascularization: Secondary | ICD-10-CM | POA: Diagnosis not present

## 2016-05-02 ENCOUNTER — Other Ambulatory Visit (INDEPENDENT_AMBULATORY_CARE_PROVIDER_SITE_OTHER): Payer: Medicare Other

## 2016-05-02 ENCOUNTER — Encounter: Payer: Self-pay | Admitting: Internal Medicine

## 2016-05-02 ENCOUNTER — Ambulatory Visit (INDEPENDENT_AMBULATORY_CARE_PROVIDER_SITE_OTHER): Payer: Medicare Other | Admitting: Internal Medicine

## 2016-05-02 VITALS — BP 134/86 | HR 67 | Ht 59.0 in | Wt 142.0 lb

## 2016-05-02 DIAGNOSIS — E785 Hyperlipidemia, unspecified: Secondary | ICD-10-CM

## 2016-05-02 DIAGNOSIS — E039 Hypothyroidism, unspecified: Secondary | ICD-10-CM | POA: Diagnosis not present

## 2016-05-02 DIAGNOSIS — E1169 Type 2 diabetes mellitus with other specified complication: Secondary | ICD-10-CM | POA: Diagnosis not present

## 2016-05-02 DIAGNOSIS — E669 Obesity, unspecified: Secondary | ICD-10-CM | POA: Diagnosis not present

## 2016-05-02 DIAGNOSIS — I10 Essential (primary) hypertension: Secondary | ICD-10-CM

## 2016-05-02 LAB — BASIC METABOLIC PANEL
BUN: 26 mg/dL — ABNORMAL HIGH (ref 6–23)
CALCIUM: 9.5 mg/dL (ref 8.4–10.5)
CO2: 27 meq/L (ref 19–32)
CREATININE: 1 mg/dL (ref 0.40–1.20)
Chloride: 103 mEq/L (ref 96–112)
GFR: 55.51 mL/min — AB (ref >60.00)
Glucose, Bld: 136 mg/dL — ABNORMAL HIGH (ref 70–99)
Potassium: 4.6 mEq/L (ref 3.5–5.1)
Sodium: 139 mEq/L (ref 135–145)

## 2016-05-02 LAB — LIPID PANEL
Cholesterol: 221 mg/dL — ABNORMAL HIGH (ref 0–200)
HDL: 47.5 mg/dL (ref >39.00)
NONHDL: 173.43
Total CHOL/HDL Ratio: 5
Triglycerides: 227 mg/dL — ABNORMAL HIGH (ref 0.0–149.0)
VLDL: 45.4 mg/dL — ABNORMAL HIGH (ref 0.0–40.0)

## 2016-05-02 LAB — HEPATIC FUNCTION PANEL
ALT: 8 U/L (ref 0–35)
AST: 12 U/L (ref 0–37)
Albumin: 4.1 g/dL (ref 3.5–5.2)
Alkaline Phosphatase: 34 U/L — ABNORMAL LOW (ref 39–117)
BILIRUBIN DIRECT: 0 mg/dL (ref 0.0–0.3)
BILIRUBIN TOTAL: 0.4 mg/dL (ref 0.2–1.2)
Total Protein: 6.7 g/dL (ref 6.0–8.3)

## 2016-05-02 LAB — LDL CHOLESTEROL, DIRECT: LDL DIRECT: 118 mg/dL

## 2016-05-02 LAB — HEMOGLOBIN A1C: HEMOGLOBIN A1C: 6.9 % — AB (ref 4.6–6.5)

## 2016-05-02 LAB — TSH: TSH: 4.78 u[IU]/mL — AB (ref 0.35–4.50)

## 2016-05-02 NOTE — Patient Instructions (Addendum)
"   [place]" is the [employer] movie I recommend you see Angela Nevin Christmas!  Tdap today   Please remember to go to the lab department downstairs for your tests - we will call you with the results when they are available.  See calendar for specific medication instructions and bring it back for each and every office visit for every healthcare provider you see.  Without it,  you may not receive the best quality medical care that we feel you deserve.  You will note that the calendar groups together  your maintenance  medications that are timed at particular times of the day.  Think of this as your checklist for what your doctor has instructed you to do until your next evaluation to see what benefit  there is  to staying on a consistent group of medications intended to keep you well.  The other group at the bottom is entirely up to you to use as you see fit  for specific symptoms that may arise between visits that require you to treat them on an as needed basis.  Think of this as your action plan or "what if" list.   Separating the top medications from the bottom group is fundamental to providing you adequate care going forward.     Please schedule a follow up visit in 3 months but call sooner if needed

## 2016-05-02 NOTE — Progress Notes (Signed)
Subjective:     Patient ID: Mikayla [name], female   DOB: 1926-04-19     MRN: [id number]  Brief patient profile:  89 yowf never smoker with moderate obesity> adult onset diabetes dx 2002 and hyperlipidemia and asthma     History of Present Illness  06/11/2015 acute extended ov/Rhylee Pucillo re: cough / no med calendar  Chief Complaint  Patient presents with  . Acute Visit    Pt c/o cough x 1 wk- occ prod with minimal clear sputum.   only used symbcort once or twice since onset/ still on timoptic  Felt the worst on 06/06/15 but persistent cough noct/ min need for otcs and no sob rec zpak   08/24/2015  f/u ov/Elaysia Devargas re: hbp/ dm/hyperlipidemia/ mild asthma Chief Complaint  Patient presents with  . Follow-up    Feeling same. Took Z-pak since last ov. No cough.Denies sob.   Rarely needing symbicort  rec Diovan(valsartan) 160 mg daily in place of losartan   09/14/2015 NP Follow up : HTN/DM /Hyperlipidemia /Mild Asthma  Pt returns for a 1 month follow up .  She says she is doing well.  Changed pt from losartan to valsartan last ov, was decreased to one half due increased lethargy. Once  decreased to 1/2 tab daily and pt feels fine now-  Denies ha/s , dizziness, sob Cholesterol was increased , she says she stopped zocor. Let things go after husband died in Feb.  Going to restart today , wants refills sent to pharmacy. She is trying to eat healthy.  She denies chest pain, orthopnea, edema or fever.  >>restart zocor 20 mg daily       05/02/2016  f/u ov/Gene Glazebrook re:  HTN /DM /Hyperlipdemia /Mild Asthma Chief Complaint  Patient presents with  . Follow-up    Pt states overall doing well and denies any new co's today.     No cp/ tia/claudication. No  Cough or chest tightness, subjective wheeze or overt sinus or hb symptoms. No unusual exp hx or h/o childhood pna/ asthma or knowledge of premature birth.  Sleeping ok without nocturnal  or early am exacerbation  of respiratory  c/o's or need for noct  saba. Also denies any obvious fluctuation of symptoms with weather or environmental changes or other aggravating or alleviating factors except as outlined above   Current Medications, Allergies, Complete Past Medical History, Past Surgical History, Family History, and Social History were reviewed in [employer] record.  ROS  The following are not active complaints unless bolded sore throat, dysphagia, dental problems, itching, sneezing,  nasal congestion or excess/ purulent secretions, ear ache,   fever, chills, sweats, unintended wt loss, classically pleuritic or exertional cp, hemoptysis,  orthopnea pnd or leg swelling, presyncope, palpitations, abdominal pain, anorexia, nausea, vomiting, diarrhea  or change in bowel or bladder habits, change in stools or urine, dysuria,hematuria,  rash, arthralgias, visual complaints, headache, numbness, weakness or ataxia or problems with walking or coordination,  change in mood/affect or memory.                     Past Medical History:  ESSENTIAL HYPERTENSION, BENIGN (ICD-401.1)  Glaucoma................................................................................[name]/[name]  HYPERLIPIDEMIA (ICD-272.4)  - target LDL less than 70 because of diabetes  -Zocor 20mg  every other day at bedtime April 14, 2010>>> LDL 110 July 06, 2010 and no recurrent  cramps  RHINITIS (ICD-472.0)  Intermittent asthma  - exac by Timolol but tolerates unless uri/flare  OSTEOPENIA (ICD-733.90)  - DEXA 12/28/07 PA  Spine .655, L Hip -1.033, Lfem Neck -1.793  - DEXA 02/23/10 1.7 L Fem -1.9 Right Fem -1.8  OBESITY (ICD-278.00)  - Target wt = 148 for BMI < 30  AODM (ICD-250.00)  Health Maintenance.........................................................................Marland KitchenWert  - Td  Declined 05/02/2016  - Pneumovax 04/2013 /  prevnar 03/03/14 - CPX 03/03/2014  - GYN Health Maint : G[name], declines mammography and colonoscopy  --med  calendar 06/04/2012 , 08/28/2013 , 04/20/14 , 11/23/2015        Objective:   Physical Exam amb elderly somber  wf nad/ vital signs reviewed  - Note on arrival 02 sats  94% on RA     wt 159 November 05, 2008 >162 April 13, 2010 > 159 July 06, 2010 > 151 10/11/2010 >150 12/01/2010 > 150 01/11/2011 > 04/27/2011  148> 148 07/31/2011 > 10/30/2011  148 > 01/31/2012  147 >143 06/04/2012 > 09/23/2012  149> 01/01/13  142 > 04/03/2013  140> 03/03/2014 141 >145 03/17/2014 >>142 04/20/2014 > 05/11/2014 >141 >141 06/11/14 > 09/22/2014 142 > 12/29/2014 139 > 04/20/2015  144 > 06/11/2015  142 >  08/24/2015  143 > 02/01/2016   144 > 05/02/2016  142    HEENT: nl dentition, turbinates, and orophanx. Nl external ear canals without cough reflex  NECK : without JVD/Nodes/TM/ nl carotid upstrokes bilaterally  LUNGS: no acc muscle, clear to A and P  CV: RRR no s3 or murmur or increase in P2, no edema  ABD: soft and nontender with nl excursion in the supine position. No bruits or organomegaly, bowel sounds nl  MS: nl gait, warm with djd changes both hands, limited flexion, no calf tenderness, cyanosis or clubbing,  d  Skin: no lesions Neuro:  Alert/ no motor or cerebellar def/ nl gait        Labs ordered/ reviewed:      Chemistry      Component Value Date/Time   NA 139 05/02/2016 1018   K 4.6 05/02/2016 1018   CL 103 05/02/2016 1018   CO2 27 05/02/2016 1018   BUN 26 (H) 05/02/2016 1018   CREATININE 1.00 05/02/2016 1018      Component Value Date/Time   CALCIUM 9.5 05/02/2016 1018   ALKPHOS 34 (L) 05/02/2016 1018   AST 12 05/02/2016 1018   ALT 8 05/02/2016 1018   BILITOT 0.4 05/02/2016 1018        Lab Results  Component Value Date   WBC 6.4 02/01/2016   HGB 11.5 (L) 02/01/2016   HCT 33.4 (L) 02/01/2016   MCV 87.6 02/01/2016   PLT 198.0 02/01/2016       Lab Results  Component Value Date   TSH 4.78 (H) 05/02/2016

## 2016-05-02 NOTE — Progress Notes (Signed)
Spoke with pt and notified of results per Dr. [name]. Pt verbalized understanding and denied any questions. 

## 2016-05-04 ENCOUNTER — Encounter: Payer: Self-pay | Admitting: Internal Medicine

## 2016-05-04 DIAGNOSIS — E039 Hypothyroidism, unspecified: Secondary | ICD-10-CM | POA: Insufficient documentation

## 2016-05-04 NOTE — Assessment & Plan Note (Addendum)
TSH still slt elevated but trending down off all rx so based on the new guidelines will w/h rx for now  Discussed in detail all the  indications, usual  risks and alternatives  relative to the benefits with patient who agrees to proceed with conservative f/u as outlined      Each maintenance medication was reviewed in detail including most importantly the difference between maintenance and as needed and under what circumstances the prns are to be used. This was done in the context of a medication calendar review which provided the patient with a user-friendly unambiguous mechanism for medication administration and reconciliation and provides an action plan for all active problems. It is critical that this be shown to every doctor  for modification during the office visit if necessary so the patient can use it as a working document.

## 2016-05-04 NOTE — Assessment & Plan Note (Signed)
-   Target LDL < 100 since has DM but poor statin tolerance   Lab Results  Component Value Date   CHOL 221 (H) 05/02/2016   HDL 47.50 05/02/2016   LDLCALC 169 (H) 08/24/2015   LDLDIRECT 118.0 05/02/2016   TRIG 227.0 (H) 05/02/2016   CHOLHDL 5 05/02/2016     Adequate control on present rx, reviewed in detail with pt > no change in rx needed  = zocor 20 mg daily, reluctant to push harder since tol poorly in past

## 2016-05-04 NOTE — Assessment & Plan Note (Signed)
Lab Results  Component Value Date   HGBA1C 6.9 (H) 05/02/2016   HGBA1C 6.6 (H) 02/01/2016   HGBA1C 7.0 (H) 08/24/2015      Adequate control on present rx, reviewed in detail with pt > no change in rx needed  = metformin

## 2016-05-04 NOTE — Assessment & Plan Note (Signed)
Adequate control on present rx, reviewed in detail with pt > no change in rx needed   

## 2016-06-19 ENCOUNTER — Other Ambulatory Visit: Payer: Self-pay | Admitting: Gastroenterology

## 2016-07-25 ENCOUNTER — Encounter: Payer: Self-pay | Admitting: Internal Medicine

## 2016-07-25 ENCOUNTER — Ambulatory Visit (INDEPENDENT_AMBULATORY_CARE_PROVIDER_SITE_OTHER): Payer: Medicare Other | Admitting: Internal Medicine

## 2016-07-25 ENCOUNTER — Other Ambulatory Visit (INDEPENDENT_AMBULATORY_CARE_PROVIDER_SITE_OTHER): Payer: Medicare Other

## 2016-07-25 VITALS — BP 172/84 | HR 77 | Temp 97.9°F | Ht 59.0 in | Wt 144.0 lb

## 2016-07-25 DIAGNOSIS — E1169 Type 2 diabetes mellitus with other specified complication: Secondary | ICD-10-CM

## 2016-07-25 DIAGNOSIS — E669 Obesity, unspecified: Secondary | ICD-10-CM

## 2016-07-25 DIAGNOSIS — E039 Hypothyroidism, unspecified: Secondary | ICD-10-CM | POA: Diagnosis not present

## 2016-07-25 DIAGNOSIS — J069 Acute upper respiratory infection, unspecified: Secondary | ICD-10-CM

## 2016-07-25 LAB — TSH: TSH: 5.48 u[IU]/mL — ABNORMAL HIGH (ref 0.35–4.50)

## 2016-07-25 LAB — BASIC METABOLIC PANEL
BUN: 23 mg/dL (ref 6–23)
CALCIUM: 9.6 mg/dL (ref 8.4–10.5)
CHLORIDE: 101 meq/L (ref 96–112)
CO2: 27 meq/L (ref 19–32)
Creatinine, Ser: 0.95 mg/dL (ref 0.40–1.20)
GFR: 58.87 mL/min — ABNORMAL LOW (ref >60.00)
GLUCOSE: 145 mg/dL — AB (ref 70–99)
POTASSIUM: 4.5 meq/L (ref 3.5–5.1)
SODIUM: 137 meq/L (ref 135–145)

## 2016-07-25 LAB — HEMOGLOBIN A1C: Hgb A1c MFr Bld: 6.9 % — ABNORMAL HIGH (ref 4.6–6.5)

## 2016-07-25 MED ORDER — AZITHROMYCIN 250 MG PO TABS: ORAL_TABLET | ORAL | 0 refills | Status: DC

## 2016-07-25 NOTE — Progress Notes (Signed)
Subjective:     Patient ID: Mikayla [name], female   DOB: 10/27/1925     MRN: [id number]  Brief patient profile:  8 yowf never smoker with moderate obesity> adult onset diabetes dx 2002 and hyperlipidemia and asthma     History of Present Illness  06/11/2015 acute extended ov/[name] re: cough / no med calendar  Chief Complaint  Patient presents with  . Acute Visit    Pt c/o cough x 1 wk- occ prod with minimal clear sputum.   only used symbcort once or twice since onset/ still on timoptic  Felt the worst on 06/06/15 but persistent cough noct/ min need for otcs and no sob rec zpak   08/24/2015  f/u ov/[name] re: hbp/ dm/hyperlipidemia/ mild asthma Chief Complaint  Patient presents with  . Follow-up    Feeling same. Took Z-pak since last ov. No cough.Denies sob.   Rarely needing symbicort  rec Diovan(valsartan) 160 mg daily in place of losartan   09/14/2015 NP Follow up : HTN/DM /Hyperlipidemia /Mild Asthma  Pt returns for a 1 month follow up .  She says she is doing well.  Changed pt from losartan to valsartan last ov, was decreased to one half due increased lethargy. Once  decreased to 1/2 tab daily and pt feels fine now-  Denies ha/s , dizziness, sob Cholesterol was increased , she says she stopped zocor. Let things go after husband died in Feb.  Going to restart today , wants refills sent to pharmacy. She is trying to eat healthy.  She denies chest pain, orthopnea, edema or fever.  >>restart zocor 20 mg daily    07/25/2016  f/u ov/[name] re:  DM/hbp/ uri wit flare of wheeze Chief Complaint  Patient presents with  . Follow-up    Pt c/o "cold" for the past few days. She is wheezing and has prod with dark yellow sputum.   acute onset 07/20/16 tickle in throat then cough/nasal congestion/ wheeze starting 07/23/16 > some better on symb 80 2bid but still on timoptic Nasal d/c is clear/ sputum is yellow and worse in am  not following action plans on med calendar  Re action plan   No  obvious day to day or daytime variability or assoc  Sob/ mucus plugs or hemoptysis or cp or chest tightness,  or overt hb symptoms. No unusual exp hx or h/o childhood pna/ asthma or knowledge of premature birth.  Sleeping ok without nocturnal  or early am exacerbation  of respiratory  c/o's or need for noct saba. Also denies any obvious fluctuation of symptoms with weather or environmental changes or other aggravating or alleviating factors except as outlined above   Current Medications, Allergies, Complete Past Medical History, Past Surgical History, Family History, and Social History were reviewed in [employer] [facility] record.  ROS  The following are not active complaints unless bolded sore throat, dysphagia, dental problems, itching, sneezing,  nasal congestion or excess/ purulent secretions, ear ache,   fever, chills, sweats, unintended wt loss, classically pleuritic or exertional cp,  orthopnea pnd or leg swelling, presyncope, palpitations, abdominal pain, anorexia, nausea, vomiting, diarrhea  or change in bowel or bladder habits, change in stools or urine, dysuria,hematuria,  rash, arthralgias, visual complaints, headache, numbness, weakness or ataxia or problems with walking or coordination,  change in mood/affect or memory.  Past Medical History:  ESSENTIAL HYPERTENSION, BENIGN (ICD-401.1)  Glaucoma................................................................................[name]/[name]  HYPERLIPIDEMIA (ICD-272.4)  - target LDL less than 70 because of diabetes  -Zocor 20mg  every other day at bedtime April 14, 2010>>> LDL 110 July 06, 2010 and no recurrent  cramps  RHINITIS (ICD-472.0)  Intermittent asthma  - exac by Timolol but tolerates unless uri/flare  OSTEOPENIA (ICD-733.90)  - DEXA 12/28/07 PA Spine .655, L Hip -1.033, Lfem Neck -1.793  - DEXA 02/23/10 1.7 L Fem -1.9 Right Fem -1.8  OBESITY (ICD-278.00)  - Target  wt = 148 for BMI < 30  AODM (ICD-250.00)  Health Maintenance.........................................................................Marland KitchenWert  - Td  Declined 05/02/2016  - Pneumovax 04/2013 /  prevnar 03/03/14 - CPX 03/03/2014  - GYN Health Maint : [name], declines mammography and colonoscopy  --med calendar 06/04/2012 , 08/28/2013 , 04/20/14 , 11/23/2015        Objective:   Physical Exam  amb elderly somber  wf nad/ vital signs reviewed - Note on arrival 02 sats  97% on RA and bp 172/84 s meds yet  this am  And after decongestants   -     wt 159 November 05, 2008 >162 April 13, 2010 > 159 July 06, 2010 > 151 10/11/2010 >150 12/01/2010 > 150 01/11/2011 > 04/27/2011  148> 148 07/31/2011 > 10/30/2011  148 > 01/31/2012  147 >143 06/04/2012 > 09/23/2012  149> 01/01/13  142 > 04/03/2013  140> 03/03/2014 141 >145 03/17/2014 >>142 04/20/2014 > 05/11/2014 >141 >141 06/11/14 > 09/22/2014 142 > 12/29/2014 139 > 04/20/2015  144 > 06/11/2015  142 >  08/24/2015  143 > 02/01/2016   144 > 05/02/2016  142    HEENT: nl dentition, turbinates, and orophanx. Nl external ear canals without cough reflex  NECK : without JVD/Nodes/TM/ nl carotid upstrokes bilaterally  LUNGS: no acc muscle,  Mid exp rhonchi bilaterally  Better with plm  CV: RRR no s3 or murmur or increase in P2, no edema  ABD: soft and nontender with nl excursion in the supine position. No bruits or organomegaly, bowel sounds nl  MS: nl gait, warm with djd changes both hands, limited flexion, no calf tenderness, cyanosis or clubbing,  d  Skin: no lesions Neuro:  Alert/ no motor or cerebellar def/ nl gait     Labs ordered/ reviewed Lab Results  Component Value Date   HGBA1C 6.9 (H) 07/25/2016   HGBA1C 6.9 (H) 05/02/2016   HGBA1C 6.6 (H) 02/01/2016     Lab Results  Component Value Date   TSH 5.48 (H) 07/25/2016       Chemistry      Component Value Date/Time   NA 137 07/25/2016 1012   K 4.5 07/25/2016 1012   CL 101 07/25/2016 1012   CO2 27  07/25/2016 1012   BUN 23 07/25/2016 1012   CREATININE 0.95 07/25/2016 1012      Component Value Date/Time   CALCIUM 9.6 07/25/2016 1012   ALKPHOS 34 (L) 05/02/2016 1018   AST 12 05/02/2016 1018   ALT 8 05/02/2016 1018   BILITOT 0.4 05/02/2016 1018

## 2016-07-25 NOTE — Patient Instructions (Addendum)
zpak   See calendar for specific medication instructions and bring it back for each and every office visit for every healthcare provider you see.  Without it,  you may not receive the best quality medical care that we feel you deserve.  You will note that the calendar groups together  your maintenance  medications that are timed at particular times of the day.  Think of this as your checklist for what your doctor has instructed you to do until your next evaluation to see what benefit  there is  to staying on a consistent group of medications intended to keep you well.  The other group at the bottom is entirely up to you to use as you see fit  for specific symptoms that may arise between visits that require you to treat them on an as needed basis.  Think of this as your action plan or "what if" list.   Separating the top medications from the bottom group is fundamental to providing you adequate care going forward.    Please remember to go to the lab  department downstairs in the basement  for your tests - we will call you with the results when they are available.   Please schedule a follow up visit in 3 months but call sooner if needed to see Tammy for new med calendar

## 2016-07-26 NOTE — Progress Notes (Signed)
Spoke with pt and notified of results per Dr. [name]. Pt verbalized understanding and denied any questions. 

## 2016-07-30 ENCOUNTER — Encounter: Payer: Self-pay | Admitting: Internal Medicine

## 2016-07-30 NOTE — Assessment & Plan Note (Signed)
Adequate control on present rx, reviewed in detail with pt > no change in rx needed   

## 2016-07-30 NOTE — Assessment & Plan Note (Signed)
tsh minimally drifting up but she is euthyroid  Discussed in detail all the  indications, usual  risks and alternatives  relative to the benefits with patient who agrees to proceed with conservative f/u with serial tsh but no supplementation for now

## 2016-07-30 NOTE — Assessment & Plan Note (Signed)
Assoc with AB on timoptic and not following contingencies on med calendar/ see below  07/25/2016  After extensive coaching HFA effectiveness =    75% > continue symbicort   I had an extended discussion with the patient reviewing all relevant studies completed to date and  lasting 15 to 20 minutes of a 25 minute visit    Each maintenance medication was reviewed in detail including most importantly the difference between maintenance and prns and under what circumstances the prns are to be triggered using an action plan format that is not reflected in the computer generated alphabetically organized AVS but trather by a customized med calendar that reflects the AVS meds with confirmed 100% correlation.   In addition, Please see AVS for unique instructions that I personally wrote and verbalized to the the pt in detail and then reviewed with pt  by my nurse highlighting any  changes in therapy recommended at today's visit to their plan of care.

## 2016-07-31 ENCOUNTER — Telehealth: Payer: Self-pay | Admitting: Internal Medicine

## 2016-07-31 MED ORDER — PREDNISONE 10 MG PO TABS: ORAL_TABLET | ORAL | 0 refills | Status: DC

## 2016-07-31 NOTE — Telephone Encounter (Signed)
Spoke with pt and informed her of Dr. Gustavus Bryant message and pt agreed to the medication being called in. The rx was sent to pt pharmacy of choice. Pt had no further questions. Nothing further is needed at this time

## 2016-07-31 NOTE — Telephone Encounter (Signed)
Fine with me but also needs: Prednisone 10 mg take  4 each am x 2 days,   2 each am x 2 days,  1 each am x 2 days and stop  f/u by 3/23 pm  if not better

## 2016-07-31 NOTE — Telephone Encounter (Signed)
Spoke with pt. States that she is not feeling well. Reports chest congestion, sinus congestion, wheezing and cough. Cough is producing light yellow mucus. Denies chest tightness, SOB or fever. Pt declined appointment as she was just seen last week. She would like to have another Zpack.  MW - please advise. Thanks.

## 2016-07-31 NOTE — Telephone Encounter (Signed)
Attempted to contact pt but unable to leave a vm phone just rung. Will call back

## 2016-10-17 DIAGNOSIS — H348312 Tributary (branch) retinal vein occlusion, right eye, stable: Secondary | ICD-10-CM | POA: Diagnosis not present

## 2016-10-17 DIAGNOSIS — E113293 Type 2 diabetes mellitus with mild nonproliferative diabetic retinopathy without macular edema, bilateral: Secondary | ICD-10-CM | POA: Diagnosis not present

## 2016-10-17 DIAGNOSIS — H353212 Exudative age-related macular degeneration, right eye, with inactive choroidal neovascularization: Secondary | ICD-10-CM | POA: Diagnosis not present

## 2016-10-31 ENCOUNTER — Encounter: Payer: Medicare Other | Admitting: Adult Health

## 2016-12-05 ENCOUNTER — Encounter: Payer: Self-pay | Admitting: Adult Health

## 2016-12-05 ENCOUNTER — Other Ambulatory Visit (INDEPENDENT_AMBULATORY_CARE_PROVIDER_SITE_OTHER): Payer: Medicare Other

## 2016-12-05 ENCOUNTER — Ambulatory Visit (INDEPENDENT_AMBULATORY_CARE_PROVIDER_SITE_OTHER): Payer: Medicare Other | Admitting: Adult Health

## 2016-12-05 VITALS — BP 128/68 | HR 66 | Ht 59.0 in | Wt 142.0 lb

## 2016-12-05 DIAGNOSIS — E039 Hypothyroidism, unspecified: Secondary | ICD-10-CM

## 2016-12-05 DIAGNOSIS — Z23 Encounter for immunization: Secondary | ICD-10-CM | POA: Diagnosis not present

## 2016-12-05 DIAGNOSIS — E1169 Type 2 diabetes mellitus with other specified complication: Secondary | ICD-10-CM

## 2016-12-05 DIAGNOSIS — I10 Essential (primary) hypertension: Secondary | ICD-10-CM | POA: Diagnosis not present

## 2016-12-05 DIAGNOSIS — E669 Obesity, unspecified: Secondary | ICD-10-CM | POA: Diagnosis not present

## 2016-12-05 DIAGNOSIS — J4520 Mild intermittent asthma, uncomplicated: Secondary | ICD-10-CM

## 2016-12-05 DIAGNOSIS — E785 Hyperlipidemia, unspecified: Secondary | ICD-10-CM | POA: Diagnosis not present

## 2016-12-05 LAB — LIPID PANEL
CHOL/HDL RATIO: 6
Cholesterol: 292 mg/dL — ABNORMAL HIGH (ref 0–200)
HDL: 50.3 mg/dL (ref >39.00)
NONHDL: 241.66
TRIGLYCERIDES: 315 mg/dL — AB (ref 0.0–149.0)
VLDL: 63 mg/dL — ABNORMAL HIGH (ref 0.0–40.0)

## 2016-12-05 LAB — HEPATIC FUNCTION PANEL
ALT: 9 U/L (ref 0–35)
AST: 12 U/L (ref 0–37)
Albumin: 4.2 g/dL (ref 3.5–5.2)
Alkaline Phosphatase: 32 U/L — ABNORMAL LOW (ref 39–117)
BILIRUBIN DIRECT: 0.1 mg/dL (ref 0.0–0.3)
BILIRUBIN TOTAL: 0.5 mg/dL (ref 0.2–1.2)
TOTAL PROTEIN: 7 g/dL (ref 6.0–8.3)

## 2016-12-05 LAB — LDL CHOLESTEROL, DIRECT: Direct LDL: 160 mg/dL

## 2016-12-05 LAB — TSH: TSH: 9.57 u[IU]/mL — AB (ref 0.35–4.50)

## 2016-12-05 NOTE — Assessment & Plan Note (Signed)
Well-controlled. Continue on current regimen

## 2016-12-05 NOTE — Assessment & Plan Note (Signed)
LDL goal less than 100. Lab work today for fasting lipid panel. Patient advised may need to restart simvastatin. He did on a low-fat, low-cholesterol diet

## 2016-12-05 NOTE — Assessment & Plan Note (Signed)
Well controlled. Continue on current regimen

## 2016-12-05 NOTE — Addendum Note (Signed)
Addended by: Parke Poisson E on: 12/05/2016 10:24 AM   Modules accepted: Orders

## 2016-12-05 NOTE — Patient Instructions (Addendum)
TDAP vaccine today .  Labs today .  Low fat cholesterol diet. Keep sweets low.  Follow up Dr. Melvyn Novas  In 3- months and As needed  With labs.  Please contact office for sooner follow up if symptoms do not improve or worsen or seek emergency care  Follow med calendar closely and bring to each visit

## 2016-12-05 NOTE — Progress Notes (Signed)
$'@Patient'W$  ID: Mikayla [name], female    DOB: Dec 16, 1925, 83 y.o.   MRN: [id number]  Chief Complaint  Patient presents with  . Follow-up    Referring provider: Tanda Rockers, MD  HPI: 83 year old female, never smoker followed for obesity, diabetes, hypertension and hyperlipidemia and asthma.  TEST /PMH  ESSENTIAL HYPERTENSION, BENIGN (ICD-401.1)  Glaucoma................................................................................[name]/[name]  HYPERLIPIDEMIA (ICD-272.4)  - target LDL less than 70 because of diabetes  -Zocor '20mg'$  every other day at bedtime April 14, 2010>>> LDL 110 July 06, 2010 and no recurrent  cramps  RHINITIS (ICD-472.0)  Intermittent asthma  - exac by Timolol but tolerates unless uri/flare  OSTEOPENIA (ICD-733.90)  - DEXA 12/28/07 PA Spine .655, L Hip -1.033, Lfem Neck -1.793  - DEXA 02/23/10 1.7 L Fem -1.9 Right Fem -1.8  OBESITY (ICD-278.00)  - Target wt = 148 for BMI < 30  AODM (ICD-250.00)  Health Maintenance.........................................................................Marland KitchenWert  - Td  Declined 05/02/2016  - Pneumovax 04/2013 /  prevnar 03/03/14 - CPX 03/03/2014  - GYN Health Maint : [name], declines mammography and colonoscopy  --med calendar 06/04/2012 , 08/28/2013 , 04/20/14 , 11/23/2015 , 12/05/2016    12/05/2016 Follow up : HTN, hyperlipidemia, diabetes and asthma Patient returns for a four-month follow-up. Patient says since last visit she has been doing well. Patient has known hypertension. Blood pressure is well-controlled today on her current regimen. She denies any headaches or chest pain. We reviewed all her medications organize them into a medication count with patient education. It appears she is taking her medications correctly.  Patient has hyperlipidemia. She has been on simvastatin in the past. Patient says she stopped taking this several months ago. Wants to see if she can do it on her own with diet and  exercise. She is fasting for labs today.  Patient has diabetes. Patient says she eats a low sweet diet. She remains on metformin. Last A1c was 6.9 in March.  Patient is due for her TDAP  vaccine today. Patient education was given.  Patient has borderline hypothyroidism. She is asymptomatic. We have been following her TSH and holding on replacement therapy . She denies any cold or heat intolerances severe fatigue, or constipation.  Patient has asthma. She uses Symbicort as needed. Patient says she's not had to have any albuterol or Symbicort recently. sHe denies any cough, wheezing or shortness of breath.    Allergies  Allergen Reactions  . Amoxicillin Rash  . Atorvastatin     REACTION: aches    Immunization History  Administered Date(s) Administered  . Influenza Split 01/25/2011, 01/31/2012  . Influenza Whole 02/28/2007, 04/02/2008, 03/12/2009, 02/10/2010  . Influenza, High Dose Seasonal PF 02/01/2016  . Influenza,inj,Quad PF,36+ Mos 04/02/2013, 03/03/2014, 04/20/2015  . Pneumococcal Conjugate-13 03/03/2014  . Pneumococcal Polysaccharide-23 06/04/2012  . Pneumococcal-Unspecified 09/12/2000  . Td 08/13/2006    Past Medical History:  Diagnosis Date  . Chronic rhinitis   . Disorder of bone and cartilage, unspecified   . Essential hypertension, benign   . Obesity, unspecified   . Other and unspecified hyperlipidemia   . Type II or unspecified type diabetes mellitus without mention of complication, not stated as uncontrolled     Tobacco History: History  Smoking Status  . Never Smoker  Smokeless Tobacco  . Never Used   Counseling given: Not Answered   Outpatient Encounter Prescriptions as of 12/05/2016  Medication Sig  . acetaminophen (TYLENOL) 325 MG tablet Take per bottle as needed for pain  . aspirin 325 MG  tablet Take 325 mg by mouth daily.    Marland Kitchen azithromycin (ZITHROMAX) 250 MG tablet Take 2 on day one then 1 daily x 4 days  . budesonide-formoterol (SYMBICORT)  80-4.5 MCG/ACT inhaler Inhale 2 puffs into the lungs 2 (two) times daily as needed (shortness of breath/wheezing).  . calcium carbonate (TUMS EX) 750 MG chewable tablet Chew 1 tablet by mouth 3 (three) times daily with meals.   . Cholecalciferol (VITAMIN D) 1000 UNITS capsule Take 1,000 Units by mouth daily.    Marland Kitchen dextromethorphan (DELSYM) 30 MG/5ML liquid 2 tsp by mouth every 12 hours as needed for cough  . famotidine (PEPCID) 20 MG tablet Take 20 mg by mouth at bedtime as needed for heartburn (COUGH FLARE).   . Homeopathic Products ([employer] LEG CRAMPS PAIN RELIEF PO) USE AS NEEDED FOR LEGS CRAMPS  . ibuprofen (ADVIL,MOTRIN) 200 MG tablet Take 200 mg by mouth every 6 (six) hours as needed.  . loratadine (CLARITIN) 10 MG tablet Take 10 mg by mouth daily as needed (DRIPPY NOSE/WATERY EYE/INTIATIVE).   Marland Kitchen meclizine (ANTIVERT) 12.5 MG tablet Take 12.5 mg by mouth every 8 (eight) hours as needed for dizziness.  . metFORMIN (GLUCOPHAGE) 500 MG tablet TAKE 1 TABLET BY MOUTH 2 TIMES A DAY WITH A MEAL.  . Multiple Vitamins-Minerals (CENTRUM SILVER PO) Take 1 tablet by mouth daily.    . Multiple Vitamins-Minerals (ICAPS PO) Take by mouth. Take 1 tablet by mouth every morning  . omeprazole (PRILOSEC) 40 MG capsule Take 1 capsule (40 mg total) by mouth daily. (Patient taking differently: Take 40 mg by mouth daily before breakfast. )  . predniSONE (DELTASONE) 10 MG tablet Take 4 tabs x 2 days, 2 tabs x 2 days, then 1 tab x 2 days and stop.  . simvastatin (ZOCOR) 20 MG tablet Take 1 tablet (20 mg total) by mouth at bedtime.  . timolol (BETIMOL) 0.25 % ophthalmic solution Place 1 drop into both eyes at bedtime.  . traMADol (ULTRAM) 50 MG tablet Take 50 mg by mouth every 8 (eight) hours as needed for severe pain (or cough).  . valsartan (DIOVAN) 160 MG tablet Take 80 mg by mouth daily.   No facility-administered encounter medications on file as of 12/05/2016.      Review of Systems  Constitutional:   No  weight  loss, night sweats,  Fevers, chills, fatigue, or  lassitude.  HEENT:   No headaches,  Difficulty swallowing,  Tooth/dental problems, or  Sore throat,                No sneezing, itching, ear ache, nasal congestion, post nasal drip,   CV:  No chest pain,  Orthopnea, PND, swelling in lower extremities, anasarca, dizziness, palpitations, syncope.   GI  No heartburn, indigestion, abdominal pain, nausea, vomiting, diarrhea, change in bowel habits, loss of appetite, bloody stools.   Resp: No shortness of breath with exertion or at rest.  No excess mucus, no productive cough,  No non-productive cough,  No coughing up of blood.  No change in color of mucus.  No wheezing.  No chest wall deformity  Skin: no rash or lesions.  GU: no dysuria, change in color of urine, no urgency or frequency.  No flank pain, no hematuria   MS:  No joint pain or swelling.  No decreased range of motion.  No back pain.    Physical Exam  BP 128/68 (BP Location: Left Arm, Cuff Size: Normal)   Pulse 66   Ht  _0  (1.499 m)   Wt 142 lb (64.4 kg)   SpO2 99%   BMI 28.68 kg/m   GEN: A/Ox3; pleasant , NAD, well nourished    HEENT:  Collins/AT,  EACs-clear, TMs-wnl, NOSE-clear, THROAT-clear, no lesions, no postnasal drip or exudate noted.   NECK:  Supple w/ fair ROM; no JVD; normal carotid impulses w/o bruits; no thyromegaly or nodules palpated; no lymphadenopathy.    RESP  Clear  P & A; w/o, wheezes/ rales/ or rhonchi. no accessory muscle use, no dullness to percussion  CARD:  RRR, no m/r/g, no peripheral edema, pulses intact, no cyanosis or clubbing.  GI:   Soft & nt; nml bowel sounds; no organomegaly or masses detected.   Musco: Warm bil, no deformities or joint swelling noted.   Neuro: alert, no focal deficits noted.    Skin: Warm, no lesions or rashes   Lab Results:  CBC    Component Value Date/Time   WBC 6.4 02/01/2016 1013   RBC 3.82 (L) 02/01/2016 1013   HGB 11.5 (L) 02/01/2016 1013   HCT 33.4 (L)  02/01/2016 1013   PLT 198.0 02/01/2016 1013   MCV 87.6 02/01/2016 1013   MCHC 34.3 02/01/2016 1013   RDW 13.7 02/01/2016 1013   LYMPHSABS 1.6 02/01/2016 1013   MONOABS 0.4 02/01/2016 1013   EOSABS 0.2 02/01/2016 1013   BASOSABS 0.0 02/01/2016 1013    BMET    Component Value Date/Time   NA 137 07/25/2016 1012   K 4.5 07/25/2016 1012   CL 101 07/25/2016 1012   CO2 27 07/25/2016 1012   GLUCOSE 145 (H) 07/25/2016 1012   GLUCOSE 126 (H) 04/23/2006 1043   BUN 23 07/25/2016 1012   CREATININE 0.95 07/25/2016 1012   CALCIUM 9.6 07/25/2016 1012   GFRNONAA 117.17 04/13/2010 1042   GFRAA  07/10/2008 1028    >60        The eGFR has been calculated using the MDRD equation. This calculation has not been validated in all clinical situations. eGFR's persistently <60 mL/min signify possible Chronic Kidney Disease.    BNP No results found for: BNP  ProBNP No results found for: PROBNP  Imaging: No results found.   Assessment & Plan:   Diabetes mellitus type 2 in obese Well controlled. Continue on current regimen  Mild intermittent extrinsic asthma without complication Well controlled without flare. Continue on current regimen  Essential hypertension Well-controlled. Continue on current regimen  Hyperlipidemia associated with type 2 diabetes mellitus LDL goal less than 100. Lab work today for fasting lipid panel. Patient advised may need to restart simvastatin. He did on a low-fat, low-cholesterol diet  Hypothyroidism, acquired Borderline hypothyroidism. Check TSH today. Patient is asymptomatic, we'll continue to follow     Rexene Edison, NP 12/05/2016

## 2016-12-05 NOTE — Assessment & Plan Note (Signed)
Well controlled without flare. Continue on current regimen

## 2016-12-05 NOTE — Assessment & Plan Note (Signed)
Borderline hypothyroidism. Check TSH today. Patient is asymptomatic, we'll continue to follow

## 2016-12-06 NOTE — Progress Notes (Signed)
Chart and office note reviewed in detail  > agree with a/p as outlined    

## 2016-12-11 ENCOUNTER — Telehealth: Payer: Self-pay | Admitting: Adult Health

## 2016-12-11 ENCOUNTER — Other Ambulatory Visit: Payer: Self-pay | Admitting: Adult Health

## 2016-12-11 MED ORDER — SIMVASTATIN 20 MG PO TABS: 20.0000 mg | ORAL_TABLET | Freq: Every day | ORAL | 3 refills | Status: DC

## 2016-12-11 NOTE — Telephone Encounter (Signed)
Spoke with patient who is hesitant to begin Synthroid as recommended and would like to know if she can wait for her October 2018 appt to discuss further?  TP please advise, thank you.   Notes recorded by Rinaldo Ratel, CMA on 12/11/2016 at 5:12 PM EDT Called spoke with patient about lab results/recs. Pt voiced her understanding. Orders only encounter created for the Simvastatin 20mg  refill (pt stated she is out). However, pt does not want to begin Synthroid and would like to hold off on this until October for her next ov to be discussed further. Pt okay with detailed message if she does not answer the phone.  Telephone encounter created with this question/concern for better tracking. ------  Notes recorded by Melvenia Needles, NP on 12/07/2016 at 10:21 PM EDT Cholesterol is up again , LDL is not at goal  Would restart Simvstatin daily .  follow up in 3 months with LFT and Lipid  LFT are good  TSH is not at goal would begin Synthroid 58mcg daily #30. 5 refills.  Would recheck TSH in 3 months .

## 2016-12-11 NOTE — Progress Notes (Signed)
Called spoke with patient about lab results/recs.  Pt voiced her understanding.  Orders only encounter created for the Simvastatin 20mg  refill (pt stated she is out).  However, pt does not want to begin Synthroid and would like to hold off on this until October for her next ov to be discussed further.  Pt okay with detailed message if she does not answer the phone.  Telephone encounter created with this question/concern for better tracking.

## 2016-12-12 NOTE — Telephone Encounter (Signed)
lmtcb for pt. Routing back to [place] for follow-up.

## 2016-12-12 NOTE — Telephone Encounter (Signed)
It would be better to start now as the TSH up at 9 which can cause cholesterol problems and even heart problems if it get too bad We can start at a very low dose 49mcg 1/2 tab of Synthroid and follow up her TSH in [place] if she would like otherwise if she declines we will check again in Oct.  Please contact office for sooner follow up if symptoms do not improve or worsen or seek emergency care

## 2016-12-22 NOTE — Addendum Note (Signed)
Addended by: Parke Poisson E on: 12/22/2016 11:49 AM   Modules accepted: Orders

## 2016-12-26 NOTE — Telephone Encounter (Signed)
Attempted to call pt to relay message from TP but pt did not answer.  Left message for pt to call our office back.

## 2016-12-27 NOTE — Telephone Encounter (Signed)
Pt called back, I relayed below info to pt and pt states that she still does not wish to start Synthroid at this time, would rather wait until next OV to consider further.    Nothing further needed.

## 2017-01-16 ENCOUNTER — Telehealth: Payer: Self-pay | Admitting: Internal Medicine

## 2017-01-16 MED ORDER — IRBESARTAN 75 MG PO TABS: 75.0000 mg | ORAL_TABLET | Freq: Every day | ORAL | 11 refills | Status: DC

## 2017-01-16 NOTE — Telephone Encounter (Signed)
atc pt X2, line rang to fast busy signal. MW please advise on alternative to Valsartan.  Thanks!

## 2017-01-16 NOTE — Telephone Encounter (Signed)
Stop valsartan Start losartan 100 mg one daily

## 2017-01-16 NOTE — Telephone Encounter (Signed)
Rx for avapro was sent to pharm  Left a detailed msg for the pt to be made aware

## 2017-01-16 NOTE — Telephone Encounter (Signed)
Spoke with pt. She is aware of this medication change. States she was on this previously and MW changed her to Valsartan.  MW - please advise. Thanks.

## 2017-01-16 NOTE — Telephone Encounter (Signed)
Ok then let's do avapro 75 mg daily

## 2017-01-25 DIAGNOSIS — Z961 Presence of intraocular lens: Secondary | ICD-10-CM | POA: Diagnosis not present

## 2017-01-25 DIAGNOSIS — H401131 Primary open-angle glaucoma, bilateral, mild stage: Secondary | ICD-10-CM | POA: Diagnosis not present

## 2017-01-25 DIAGNOSIS — H26493 Other secondary cataract, bilateral: Secondary | ICD-10-CM | POA: Diagnosis not present

## 2017-03-13 ENCOUNTER — Other Ambulatory Visit (INDEPENDENT_AMBULATORY_CARE_PROVIDER_SITE_OTHER): Payer: Medicare Other

## 2017-03-13 ENCOUNTER — Telehealth: Payer: Self-pay | Admitting: Internal Medicine

## 2017-03-13 ENCOUNTER — Ambulatory Visit (INDEPENDENT_AMBULATORY_CARE_PROVIDER_SITE_OTHER): Payer: Medicare Other | Admitting: Internal Medicine

## 2017-03-13 ENCOUNTER — Encounter: Payer: Self-pay | Admitting: Internal Medicine

## 2017-03-13 VITALS — BP 124/78 | HR 62 | Ht <= 58 in | Wt 139.6 lb

## 2017-03-13 DIAGNOSIS — Z23 Encounter for immunization: Secondary | ICD-10-CM | POA: Diagnosis not present

## 2017-03-13 DIAGNOSIS — E1169 Type 2 diabetes mellitus with other specified complication: Secondary | ICD-10-CM

## 2017-03-13 DIAGNOSIS — E669 Obesity, unspecified: Secondary | ICD-10-CM

## 2017-03-13 DIAGNOSIS — I10 Essential (primary) hypertension: Secondary | ICD-10-CM

## 2017-03-13 DIAGNOSIS — E785 Hyperlipidemia, unspecified: Secondary | ICD-10-CM

## 2017-03-13 DIAGNOSIS — E039 Hypothyroidism, unspecified: Secondary | ICD-10-CM

## 2017-03-13 LAB — LIPID PANEL
Cholesterol: 216 mg/dL — ABNORMAL HIGH (ref 0–200)
HDL: 54 mg/dL (ref >39.00)
LDL Cholesterol: 125 mg/dL — ABNORMAL HIGH (ref 0–99)
NonHDL: 161.5
Total CHOL/HDL Ratio: 4
Triglycerides: 182 mg/dL — ABNORMAL HIGH (ref 0.0–149.0)
VLDL: 36.4 mg/dL (ref 0.0–40.0)

## 2017-03-13 LAB — BASIC METABOLIC PANEL
BUN: 27 mg/dL — AB (ref 6–23)
CALCIUM: 9.7 mg/dL (ref 8.4–10.5)
CO2: 27 mEq/L (ref 19–32)
Chloride: 102 mEq/L (ref 96–112)
Creatinine, Ser: 1.01 mg/dL (ref 0.40–1.20)
GFR: 54.77 mL/min — AB (ref >60.00)
Glucose, Bld: 143 mg/dL — ABNORMAL HIGH (ref 70–99)
Potassium: 4.9 mEq/L (ref 3.5–5.1)
Sodium: 139 mEq/L (ref 135–145)

## 2017-03-13 LAB — HEPATIC FUNCTION PANEL
ALT: 8 U/L (ref 0–35)
AST: 13 U/L (ref 0–37)
Albumin: 4.1 g/dL (ref 3.5–5.2)
Alkaline Phosphatase: 31 U/L — ABNORMAL LOW (ref 39–117)
Bilirubin, Direct: 0.1 mg/dL (ref 0.0–0.3)
Total Bilirubin: 0.5 mg/dL (ref 0.2–1.2)
Total Protein: 6.8 g/dL (ref 6.0–8.3)

## 2017-03-13 LAB — HEMOGLOBIN A1C: Hgb A1c MFr Bld: 6.6 % — ABNORMAL HIGH (ref 4.6–6.5)

## 2017-03-13 LAB — TSH: TSH: 5.95 u[IU]/mL — AB (ref 0.35–4.50)

## 2017-03-13 NOTE — Progress Notes (Signed)
LMTCB

## 2017-03-13 NOTE — Patient Instructions (Signed)
Please remember to go to the lab department downstairs in the basement  for your tests - we will call you with the results when they are available.      Please schedule a follow up visit in 3 months to see Mikayla [name]  but call sooner if needed

## 2017-03-13 NOTE — Telephone Encounter (Signed)
Call patient : Studies are overall ok - sugar and thyroid function better sltly, so no need to change rx   Called and spoke with pt and she is aware of results per MW.

## 2017-03-13 NOTE — Progress Notes (Signed)
Subjective:     Patient ID: Mikayla [name], female   DOB: 1926/04/05     MRN: [id number]  Brief patient profile:  89 yowf never smoker with moderate obesity> adult onset diabetes dx 2002 and hyperlipidemia and asthma     History of Present Illness  06/11/2015 acute extended ov/Mikayla [name] re: cough / no med calendar  Chief Complaint  Patient presents with  . Acute Visit    Pt c/o cough x 1 wk- occ prod with minimal clear sputum.   only used symbcort once or twice since onset/ still on timoptic  Felt the worst on 06/06/15 but persistent cough noct/ min need for otcs and no sob rec zpak   08/24/2015  f/u ov/Mikayla [name] re: hbp/ dm/hyperlipidemia/ mild asthma Chief Complaint  Patient presents with  . Follow-up    Feeling same. Took Z-pak since last ov. No cough.Denies sob.   Rarely needing symbicort  rec Diovan(valsartan) 160 mg daily in place of losartan   09/14/2015 NP Follow up : HTN/DM /Hyperlipidemia /Mild Asthma  Pt returns for a 1 month follow up .  She says she is doing well.  Changed pt from losartan to valsartan last ov, was decreased to one half due increased lethargy. Once  decreased to 1/2 tab daily and pt feels fine now-  Denies ha/s , dizziness, sob Cholesterol was increased , she says she stopped zocor. Let things go after husband died in Feb.  Going to restart today , wants refills sent to pharmacy. She is trying to eat healthy.  She denies chest pain, orthopnea, edema or fever.  >>restart zocor 20 mg daily     03/13/2017  f/u ov/Mikayla [name] re: hbp/ hyperlipidemia/ dm/ hypothyroidism  Chief Complaint  Patient presents with  . Follow-up    Pt has questions regarding her thyroid concerns. Pt would like flu shot today  constipation is chronic/ mild/ energy a bit low  / denies cold intolerance  No overt symptoms for low or high sugar/ no tia or claudication   No obvious  excess/ purulent sputum or mucus plugs or hemoptysis or cp or chest tightness, subjective wheeze or overt sinus  or hb symptoms. No unusual exp hx or h/o childhood pna/ asthma or knowledge of premature birth.  Sleeping ok flat without nocturnal  or early am exacerbation  of respiratory  c/o's or need for noct saba. Also denies any obvious fluctuation of symptoms with weather or environmental changes or other aggravating or alleviating factors except as outlined above   Current Allergies, Complete Past Medical History, Past Surgical History, Family History, and Social History were reviewed in [employer] record.  ROS  The following are not active complaints unless bolded Hoarseness, sore throat, dysphagia, dental problems, itching, sneezing,  nasal congestion or discharge of excess mucus or purulent secretions, ear ache,   fever, chills, sweats, unintended wt loss or wt gain, classically pleuritic or exertional cp,  orthopnea pnd or leg swelling, presyncope, palpitations, abdominal pain, anorexia, nausea, vomiting, diarrhea  or change in bowel habits or change in bladder habits, change in stools or change in urine, dysuria, hematuria,  rash, arthralgias, visual complaints, headache, numbness, weakness or ataxia or problems with walking or coordination,  change in mood/affect or memory.        Current Meds  Medication Sig  . acetaminophen (TYLENOL) 325 MG tablet Take per bottle as needed for pain  . aspirin 81 MG tablet Take 81 mg by mouth daily.   Marland Kitchen azithromycin (ZITHROMAX)  250 MG tablet Take 2 on day one then 1 daily x 4 days  . budesonide-formoterol (SYMBICORT) 80-4.5 MCG/ACT inhaler Inhale 2 puffs into the lungs 2 (two) times daily as needed (shortness of breath/wheezing).  . calcium carbonate (TUMS EX) 750 MG chewable tablet Chew 1 tablet by mouth 3 (three) times daily with meals.   . Cholecalciferol (VITAMIN D) 1000 UNITS capsule Take 1,000 Units by mouth daily.    Marland Kitchen dextromethorphan (DELSYM) 30 MG/5ML liquid 2 tsp by mouth every 12 hours as needed for cough  . famotidine (PEPCID)  20 MG tablet Take 20 mg by mouth at bedtime as needed for heartburn (COUGH FLARE).   . Homeopathic Products ([employer] LEG CRAMPS PAIN RELIEF PO) USE AS NEEDED FOR LEGS CRAMPS  . irbesartan (AVAPRO) 75 MG tablet Take 1 tablet (75 mg total) by mouth daily.  Marland Kitchen loratadine (CLARITIN) 10 MG tablet Take 10 mg by mouth daily as needed (DRIPPY NOSE/WATERY EYE/INTIATIVE).   Marland Kitchen meclizine (ANTIVERT) 12.5 MG tablet Take 12.5 mg by mouth every 8 (eight) hours as needed for dizziness.  . metFORMIN (GLUCOPHAGE) 500 MG tablet TAKE 1 TABLET BY MOUTH 2 TIMES A DAY WITH A MEAL.  . Multiple Vitamins-Minerals (CENTRUM SILVER PO) Take 1 tablet by mouth daily.    . Multiple Vitamins-Minerals (ICAPS PO) Take by mouth. Take 1 tablet by mouth every morning  . omeprazole (PRILOSEC) 40 MG capsule Take 1 capsule (40 mg total) by mouth daily. (Patient taking differently: Take 40 mg by mouth daily before breakfast. )  . predniSONE (DELTASONE) 10 MG tablet Take 4 tabs x 2 days, 2 tabs x 2 days, then 1 tab x 2 days and stop.  . simvastatin (ZOCOR) 20 MG tablet Take 1 tablet (20 mg total) by mouth at bedtime.  . timolol (BETIMOL) 0.25 % ophthalmic solution Place 1 drop into both eyes at bedtime.                        Past Medical History:  ESSENTIAL HYPERTENSION, BENIGN (ICD-401.1)  Glaucoma................................................................................[name]/Mikayla [name]  HYPERLIPIDEMIA (ICD-272.4)  - target LDL less than 70 because of diabetes  -Zocor 20mg  every other day at bedtime April 14, 2010>>> LDL 110 July 06, 2010 and no recurrent  cramps  RHINITIS (ICD-472.0)  Intermittent asthma  - exac by Timolol but tolerates unless uri/flare  OSTEOPENIA (ICD-733.90)  - DEXA 12/28/07 PA Spine .655, L Hip -1.033, Lfem Neck -1.793  - DEXA 02/23/10 1.7 L Fem -1.9 Right Fem -1.8  OBESITY (ICD-278.00)  - Target wt = 148 for BMI < 30  AODM (ICD-250.00)  Health  Maintenance.........................................................................Marland KitchenWert  - Td  12/05/16   - Pneumovax 04/2013 /  prevnar 03/03/14 - CPX 03/03/2014  - GYN Health Maint : G[name], declines mammography and colonoscopy  --med calendar 06/04/2012 , 08/28/2013 , 04/20/14 , 11/23/2015        Objective:   Physical Exam  amb elderly somber  wf nad/ vital signs reviewed - Note on arrival 02 sats  97% on RA and  BP  124/78      wt 159 November 05, 2008 >162 April 13, 2010 > 159 July 06, 2010 > 151 10/11/2010 >150 12/01/2010 > 150 01/11/2011 > 04/27/2011  148> 148 07/31/2011 > 10/30/2011  148 > 01/31/2012  147 >143 06/04/2012 > 09/23/2012  149> 01/01/13  142 > 04/03/2013  140> 03/03/2014 141 >145 03/17/2014 >>142 04/20/2014 > 05/11/2014 >141 >141 06/11/14 > 09/22/2014 142 > 12/29/2014 139 > 04/20/2015  144 > 06/11/2015  142 >  08/24/2015  143 > 02/01/2016   144 > 05/02/2016  142 > 03/13/2017  139    HEENT: nl dentition, turbinates, and orophanx. Nl external ear canals without cough reflex  NECK : without JVD/Nodes/TM/ nl carotid upstrokes bilaterally  LUNGS: no acc muscle,  Clear to A and P  CV: RRR no s3 or murmur or increase in P2, no edema  ABD: soft and nontender with nl excursion in the supine position. No bruits or organomegaly, bowel sounds nl  MS: nl gait, warm with djd changes both hands, limited flexion, no calf tenderness, cyanosis or clubbing,  d  Skin: no lesions Neuro:  Alert/ no motor or cerebellar def/ nl gait    Labs ordered/ reviewed:      Chemistry      Component Value Date/Time   NA 139 03/13/2017 1012   K 4.9 03/13/2017 1012   CL 102 03/13/2017 1012   CO2 27 03/13/2017 1012   BUN 27 (H) 03/13/2017 1012   CREATININE 1.01 03/13/2017 1012      Component Value Date/Time   CALCIUM 9.7 03/13/2017 1012   ALKPHOS 31 (L) 03/13/2017 1012   AST 13 03/13/2017 1012   ALT 8 03/13/2017 1012   BILITOT 0.5 03/13/2017 1012        Lab Results  Component Value Date   WBC  6.4 02/01/2016   HGB 11.5 (L) 02/01/2016   HCT 33.4 (L) 02/01/2016   MCV 87.6 02/01/2016   PLT 198.0 02/01/2016         Lab Results  Component Value Date   TSH 5.95 (H) 03/13/2017           Lab Results  Component Value Date   HGBA1C 6.6 (H) 03/13/2017   HGBA1C 6.9 (H) 07/25/2016   HGBA1C 6.9 (H) 05/02/2016

## 2017-03-14 NOTE — Assessment & Plan Note (Addendum)
Lab Results  Component Value Date   CREATININE 1.01 03/13/2017   CREATININE 0.95 07/25/2016   CREATININE 1.00 05/02/2016     Adequate control on present rx, reviewed in detail with pt > no change in rx needed   I had an extended discussion with the patient reviewing all relevant studies completed to date and  lasting 15 to 20 minutes of a 25 minute visit    Each maintenance medication was reviewed in detail including most importantly the difference between maintenance and prns and under what circumstances the prns are to be triggered using an action plan format that is not reflected in the computer generated alphabetically organized AVS but trather by a customized med calendar that reflects the AVS meds with confirmed 100% correlation.   In addition, Please see AVS for unique instructions that I personally wrote and verbalized to the the pt in detail and then reviewed with pt  by my nurse highlighting any  changes in therapy recommended at today's visit to their plan of care.

## 2017-03-14 NOTE — Assessment & Plan Note (Signed)
hbg A1c's trending slightly down on metformin/ renal fxn good > no change rx

## 2017-03-14 NOTE — Assessment & Plan Note (Signed)
-   Target LDL < 100 since has DM but poor statin tolerance   Lab Results  Component Value Date   CHOL 216 (H) 03/13/2017   HDL 54.00 03/13/2017   LDLCALC 125 (H) 03/13/2017   LDLDIRECT 160.0 12/05/2016   TRIG 182.0 (H) 03/13/2017   CHOLHDL 4 03/13/2017    Adequate control on present rx with lfts ok>  reviewed in detail with pt > no change in rx needed

## 2017-03-14 NOTE — Assessment & Plan Note (Signed)
tsh trending back down and she is clinically euthyroid so we will continue just to follow this every 3 months unless overt symptoms the ball as reviewed with the patient in detail today.

## 2017-05-09 ENCOUNTER — Other Ambulatory Visit: Payer: Self-pay | Admitting: Internal Medicine

## 2017-05-22 DIAGNOSIS — H353212 Exudative age-related macular degeneration, right eye, with inactive choroidal neovascularization: Secondary | ICD-10-CM | POA: Diagnosis not present

## 2017-05-22 DIAGNOSIS — H353121 Nonexudative age-related macular degeneration, left eye, early dry stage: Secondary | ICD-10-CM | POA: Diagnosis not present

## 2017-05-22 DIAGNOSIS — E113293 Type 2 diabetes mellitus with mild nonproliferative diabetic retinopathy without macular edema, bilateral: Secondary | ICD-10-CM | POA: Diagnosis not present

## 2017-05-22 DIAGNOSIS — H43813 Vitreous degeneration, bilateral: Secondary | ICD-10-CM | POA: Diagnosis not present

## 2017-06-12 ENCOUNTER — Ambulatory Visit: Payer: Medicare Other | Admitting: Internal Medicine

## 2017-06-26 ENCOUNTER — Other Ambulatory Visit: Payer: Self-pay | Admitting: Internal Medicine

## 2017-06-26 ENCOUNTER — Ambulatory Visit (INDEPENDENT_AMBULATORY_CARE_PROVIDER_SITE_OTHER): Payer: Medicare Other | Admitting: Internal Medicine

## 2017-06-26 ENCOUNTER — Other Ambulatory Visit (INDEPENDENT_AMBULATORY_CARE_PROVIDER_SITE_OTHER): Payer: Medicare Other

## 2017-06-26 ENCOUNTER — Encounter: Payer: Self-pay | Admitting: Internal Medicine

## 2017-06-26 VITALS — BP 230/90 | HR 70 | Ht <= 58 in | Wt 144.4 lb

## 2017-06-26 DIAGNOSIS — E1169 Type 2 diabetes mellitus with other specified complication: Secondary | ICD-10-CM

## 2017-06-26 DIAGNOSIS — E669 Obesity, unspecified: Secondary | ICD-10-CM

## 2017-06-26 DIAGNOSIS — E785 Hyperlipidemia, unspecified: Secondary | ICD-10-CM

## 2017-06-26 DIAGNOSIS — E039 Hypothyroidism, unspecified: Secondary | ICD-10-CM

## 2017-06-26 DIAGNOSIS — I10 Essential (primary) hypertension: Secondary | ICD-10-CM | POA: Diagnosis not present

## 2017-06-26 LAB — BASIC METABOLIC PANEL
BUN: 27 mg/dL — ABNORMAL HIGH (ref 6–23)
CALCIUM: 9.4 mg/dL (ref 8.4–10.5)
CHLORIDE: 102 meq/L (ref 96–112)
CO2: 30 meq/L (ref 19–32)
Creatinine, Ser: 1 mg/dL (ref 0.40–1.20)
GFR: 55.37 mL/min — ABNORMAL LOW (ref >60.00)
GLUCOSE: 147 mg/dL — AB (ref 70–99)
POTASSIUM: 4.7 meq/L (ref 3.5–5.1)
SODIUM: 139 meq/L (ref 135–145)

## 2017-06-26 LAB — HEPATIC FUNCTION PANEL
ALT: 10 U/L (ref 0–35)
AST: 13 U/L (ref 0–37)
Albumin: 4 g/dL (ref 3.5–5.2)
Alkaline Phosphatase: 35 U/L — ABNORMAL LOW (ref 39–117)
BILIRUBIN DIRECT: 0 mg/dL (ref 0.0–0.3)
BILIRUBIN TOTAL: 0.5 mg/dL (ref 0.2–1.2)
Total Protein: 6.7 g/dL (ref 6.0–8.3)

## 2017-06-26 LAB — LIPID PANEL
CHOL/HDL RATIO: 7
Cholesterol: 284 mg/dL — ABNORMAL HIGH (ref 0–200)
HDL: 40.8 mg/dL (ref >39.00)
NONHDL: 243.02
Triglycerides: 366 mg/dL — ABNORMAL HIGH (ref 0.0–149.0)
VLDL: 73.2 mg/dL — ABNORMAL HIGH (ref 0.0–40.0)

## 2017-06-26 LAB — LDL CHOLESTEROL, DIRECT: Direct LDL: 146 mg/dL

## 2017-06-26 LAB — HEMOGLOBIN A1C: HEMOGLOBIN A1C: 6.9 % — AB (ref 4.6–6.5)

## 2017-06-26 LAB — TSH: TSH: 7.38 u[IU]/mL — AB (ref 0.35–4.50)

## 2017-06-26 MED ORDER — LEVOTHYROXINE SODIUM 25 MCG PO TABS: 25.0000 ug | ORAL_TABLET | Freq: Every day | ORAL | 5 refills | Status: DC

## 2017-06-26 MED ORDER — NEBIVOLOL HCL 5 MG PO TABS: 5.0000 mg | ORAL_TABLET | Freq: Every day | ORAL | 0 refills | Status: DC

## 2017-06-26 NOTE — Progress Notes (Signed)
Spoke with pt and notified of results per Dr. [name]. Pt verbalized understanding and denied any questions. 

## 2017-06-26 NOTE — Progress Notes (Signed)
Subjective:     Patient ID: Mikayla [name], female   DOB: [birth year]     MRN: [id number]  Brief patient profile:  20 yowf never smoker with moderate obesity> adult onset diabetes dx 2002 and hyperlipidemia and hbp/ asthma     History of Present Illness  06/11/2015 acute extended ov/Mikayla [name] re: cough / no med calendar  Chief Complaint  Patient presents with  . Acute Visit    Pt c/o cough x 1 wk- occ prod with minimal clear sputum.   only used symbcort once or twice since onset/ still on timoptic  Felt the worst on 06/06/15 but persistent cough noct/ min need for otcs and no sob rec zpak          06/26/2017  f/u ov/Mikayla [name] re: f/u hbp/ hyperlipid/ dm  Chief Complaint  Patient presents with  . Follow-up    Doing well and no new co's.    Dyspnea:  No/ shopping housework walking dog some hills Cough: no Sleep: ok  No tia/ claudication / am HA   No obvious day to day or daytime variability or assoc excess/ purulent sputum or mucus plugs or hemoptysis or cp or chest tightness, subjective wheeze or overt sinus or hb symptoms. No unusual exposure hx or h/o childhood pna/ asthma or knowledge of premature birth.  Sleeping ok flat without nocturnal  or early am exacerbation  of respiratory  c/o's or need for noct saba. Also denies any obvious fluctuation of symptoms with weather or environmental changes or other aggravating or alleviating factors except as outlined above   Current Allergies, Complete Past Medical History, Past Surgical History, Family History, and Social History were reviewed in [employer] record.  ROS  The following are not active complaints unless bolded Hoarseness, sore throat, dysphagia, dental problems, itching, sneezing,  nasal congestion or discharge of excess mucus or purulent secretions, ear ache,   fever, chills, sweats, unintended wt loss or wt gain, classically pleuritic or exertional cp,  orthopnea pnd or leg swelling, presyncope,  palpitations, abdominal pain, anorexia, nausea, vomiting, diarrhea  or change in bowel habits or change in bladder habits, change in stools or change in urine, dysuria, hematuria,  rash, arthralgias, visual complaints, headache, numbness, weakness or ataxia or problems with walking or coordination,  change in mood/affect or memory.        Current Meds  Medication Sig  . acetaminophen (TYLENOL) 325 MG tablet Take per bottle as needed for pain  . aspirin 81 MG tablet Take 81 mg by mouth daily.   . budesonide-formoterol (SYMBICORT) 80-4.5 MCG/ACT inhaler Inhale 2 puffs into the lungs 2 (two) times daily as needed (shortness of breath/wheezing).  . calcium carbonate (TUMS EX) 750 MG chewable tablet Chew 1 tablet by mouth 3 (three) times daily with meals.   . Cholecalciferol (VITAMIN D) 1000 UNITS capsule Take 1,000 Units by mouth daily.    Marland Kitchen dextromethorphan (DELSYM) 30 MG/5ML liquid 2 tsp by mouth every 12 hours as needed for cough  . famotidine (PEPCID) 20 MG tablet Take 20 mg by mouth at bedtime as needed for heartburn (COUGH FLARE).   . Homeopathic Products ([employer]S LEG CRAMPS PAIN RELIEF PO) USE AS NEEDED FOR LEGS CRAMPS  . irbesartan (AVAPRO) 75 MG tablet Take 1 tablet (75 mg total) by mouth daily.  Marland Kitchen loratadine (CLARITIN) 10 MG tablet Take 10 mg by mouth daily as needed (DRIPPY NOSE/WATERY EYE/INTIATIVE).   Marland Kitchen meclizine (ANTIVERT) 12.5 MG tablet Take 12.5 mg by mouth  every 8 (eight) hours as needed for dizziness.  . metFORMIN (GLUCOPHAGE) 500 MG tablet TAKE 1 TABLET BY MOUTH 2 TIMES A DAY WITH A MEAL.  . Multiple Vitamins-Minerals (CENTRUM SILVER PO) Take 1 tablet by mouth daily.    . Multiple Vitamins-Minerals (ICAPS PO) Take by mouth. Take 1 tablet by mouth every morning  . omeprazole (PRILOSEC) 40 MG capsule Take 1 capsule (40 mg total) by mouth daily. (Patient taking differently: Take 40 mg by mouth daily before breakfast. )  . simvastatin (ZOCOR) 20 MG tablet Take 1 tablet (20 mg total) by  mouth at bedtime.  . timolol (BETIMOL) 0.25 % ophthalmic solution Place 1 drop into both eyes at bedtime.                             Past Medical History:  ESSENTIAL HYPERTENSION, BENIGN (ICD-401.1)  Glaucoma................................................................................[name]/[name]  HYPERLIPIDEMIA (ICD-272.4)  - target LDL less than 70 because of diabetes  -Zocor 20mg  every other day at bedtime April 14, 2010>>> LDL 110 July 06, 2010 and no recurrent  cramps  RHINITIS (ICD-472.0)  Intermittent asthma  - exac by Timolol but tolerates unless uri/flare  OSTEOPENIA (ICD-733.90)  - DEXA 12/28/07 PA Spine .655, L Hip -1.033, Lfem Neck -1.793  - DEXA 02/23/10 1.7 L Fem -1.9 Right Fem -1.8  OBESITY (ICD-278.00)  - Target wt = 148 for BMI < 30  AODM (ICD-250.00)  Health Maintenance.........................................................................Marland KitchenWert  - Td  12/05/16   - Pneumovax 04/2013 /  prevnar 03/03/14 - CPX 03/03/2014  - GYN Health Maint : G[name], declines mammography and colonoscopy  --med calendar 06/04/2012 , 08/28/2013 , 04/20/14 , 11/23/2015        Objective:   Physical Exam   amb wf nad   Vital signs reviewed - Note on arrival 02 sats  99% on RA  And bp 230/90 s am meds      wt 159 November 05, 2008 >162 April 13, 2010 > 159 July 06, 2010 > 151 10/11/2010 >150 12/01/2010 > 150 01/11/2011 > 04/27/2011  148> 148 07/31/2011 > 10/30/2011  148 > 01/31/2012  147 >143 06/04/2012 > 09/23/2012  149> 01/01/13  142 > 04/03/2013  140> 03/03/2014 141 >145 03/17/2014 >>142 04/20/2014 > 05/11/2014 >141 >141 06/11/14 > 09/22/2014 142 > 12/29/2014 139 > 04/20/2015  144 > 06/11/2015  142 >  08/24/2015  143 > 02/01/2016   144 > 05/02/2016  142 > 03/13/2017  139 >  06/26/2017  144         Vital signs reviewed - Note on arrival 02 sats  99% on RA and BP 230/90 s am meds> repeated p bystolic 10 mg x 30 min at 180/80  HEENT: nl dentition,   and oropharynx. Nl  external ear canals without cough reflex - moderate bilateral non-specific turbinate edema  And mucoid secretions R >L    NECK :  without JVD/Nodes/TM/ nl carotid upstrokes bilaterally   LUNGS: no acc muscle use,  Nl contour chest which is clear to A and P bilaterally without cough on insp or exp maneuvers   CV:  RRR  no s3 or murmur or increase in P2, and no edema   ABD:  soft and nontender with nl inspiratory excursion in the supine position. No bruits or organomegaly appreciated, bowel sounds nl  MS:  Nl gait/ ext warm without deformities, calf tenderness, cyanosis or clubbing No obvious joint restrictions   SKIN: warm and dry  without lesions    NEURO:  alert, approp, nl sensorium with  no motor or cerebellar deficits apparent.             Labs ordered/ reviewed:      Chemistry      Component Value Date/Time   NA 139 06/26/2017 0946   K 4.7 06/26/2017 0946   CL 102 06/26/2017 0946   CO2 30 06/26/2017 0946   BUN 27 (H) 06/26/2017 0946   CREATININE 1.00 06/26/2017 0946      Component Value Date/Time   CALCIUM 9.4 06/26/2017 0946   ALKPHOS 35 (L) 06/26/2017 0946   AST 13 06/26/2017 0946   ALT 10 06/26/2017 0946   BILITOT 0.5 06/26/2017 0946        Lab Results  Component Value Date   WBC 6.4 02/01/2016   HGB 11.5 (L) 02/01/2016   HCT 33.4 (L) 02/01/2016   MCV 87.6 02/01/2016   PLT 198.0 02/01/2016       Lab Results  Component Value Date   TSH 7.38 (H) 06/26/2017

## 2017-06-26 NOTE — Patient Instructions (Signed)
Please remember to go to the lab department downstairs in the basement  for your tests - we will call you with the results when they are available.  Take all medications before your visits the metformin (must be taken with meals)  Start monitoring your blood pressure several times a week at different times of the day       Please schedule a follow up visit in 3 months but call sooner if needed - to See Mikayla [name] with All medications in hand

## 2017-06-27 ENCOUNTER — Encounter: Payer: Self-pay | Admitting: Internal Medicine

## 2017-06-27 NOTE — Assessment & Plan Note (Signed)
Started synthroid 25 mcg per day with tsh  7.38     I had an extended discussion with the patient reviewing all relevant studies completed to date and  lasting 15 to 20 minutes of a 25 minute visit    Each maintenance medication was reviewed in detail including most importantly the difference between maintenance and prns and under what circumstances the prns are to be triggered using an action plan format that is not reflected in the computer generated alphabetically organized AVS but trather by a customized med calendar that reflects the AVS meds with confirmed 100% correlation.   In addition, Please see AVS for unique instructions that I personally wrote and verbalized to the the pt in detail and then reviewed with pt  by my nurse highlighting any  changes in therapy recommended at today's visit to their plan of care.

## 2017-06-27 NOTE — Assessment & Plan Note (Signed)
Lab Results  Component Value Date   CREATININE 1.00 06/26/2017   CREATININE 1.01 03/13/2017   CREATININE 0.95 07/25/2016    Did not take am meds but suspect she has and has had stiff pipes with sys >> dias hbp and problably needs a vasodilator or BB added > will self monitor at variious times of day p taking her meds consistently and let me know

## 2017-06-27 NOTE — Assessment & Plan Note (Signed)
Lab Results  Component Value Date   HGBA1C 6.9 (H) 06/26/2017   HGBA1C 6.6 (H) 03/13/2017   HGBA1C 6.9 (H) 07/25/2016      Adequate control on present rx, reviewed in detail with pt > no change in rx needed

## 2017-06-27 NOTE — Assessment & Plan Note (Signed)
-   Target LDL < 100 since has DM but poor statin tolerance   Lab Results  Component Value Date   CHOL 284 (H) 06/26/2017   HDL 40.80 06/26/2017   LDLCALC 125 (H) 03/13/2017   LDLDIRECT 146.0 06/26/2017   TRIG 366.0 (H) 06/26/2017   CHOLHDL 7 06/26/2017     Not ideal but doesn't tol statins well > diet  Reviewed , no change in meds

## 2017-07-03 ENCOUNTER — Other Ambulatory Visit: Payer: Self-pay | Admitting: Gastroenterology

## 2017-07-05 ENCOUNTER — Telehealth: Payer: Self-pay | Admitting: Gastroenterology

## 2017-07-05 MED ORDER — OMEPRAZOLE 40 MG PO CPDR: 40.0000 mg | DELAYED_RELEASE_CAPSULE | Freq: Every day | ORAL | 0 refills | Status: DC

## 2017-07-05 NOTE — Telephone Encounter (Signed)
Patient states she needs medication omeprazole refilled. Notified patient she would probably have to come in for office visit due to not being seen since 2016. Patient states she is not having any gi symptoms and wants to speak with nurse.

## 2017-07-05 NOTE — Telephone Encounter (Signed)
The pt has been scheduled for an appt and refill sent to last until appt

## 2017-07-06 ENCOUNTER — Other Ambulatory Visit: Payer: Self-pay

## 2017-07-06 MED ORDER — OMEPRAZOLE 40 MG PO CPDR: 40.0000 mg | DELAYED_RELEASE_CAPSULE | Freq: Every day | ORAL | 0 refills | Status: DC

## 2017-07-06 NOTE — Telephone Encounter (Signed)
The prescription was resent to [facility] as requested

## 2017-07-06 NOTE — Telephone Encounter (Signed)
Patient states that her refill should have been sent to [facility] drug. Please resend. She is requesting that this be sent in before 1pm so they can deliver medication to her this afternoon.

## 2017-07-09 ENCOUNTER — Telehealth: Payer: Self-pay | Admitting: Internal Medicine

## 2017-07-09 MED ORDER — FUROSEMIDE 20 MG PO TABS: 20.0000 mg | ORAL_TABLET | Freq: Every day | ORAL | 12 refills | Status: DC

## 2017-07-09 MED ORDER — IRBESARTAN 150 MG PO TABS: 150.0000 mg | ORAL_TABLET | Freq: Every day | ORAL | 12 refills | Status: DC

## 2017-07-09 NOTE — Telephone Encounter (Signed)
Spoke with pt, she states her blood pressure is still very high. She states the top numbers are in the 200's. She wants to know if MW wants to increase her medications? She was just seen on 06/26/2016. Please advise.    Patient Instructions by Tanda Rockers, MD at 06/26/2017 9:00 AM   Author: Tanda Rockers, MD Author Type: Physician Filed: 06/26/2017 9:28 AM  Note Status: Signed Cosign: Cosign Not Required Encounter Date: 06/26/2017  Editor: Tanda Rockers, MD (Physician)    Please remember to go to the lab department downstairs in the basement  for your tests - we will call you with the results when they are available.  Take all medications before your visits the metformin (must be taken with meals)  Start monitoring your blood pressure several times a week at different times of the day       Please schedule a follow up visit in 3 months but call sooner if needed - to See Tammy with All medications in hand

## 2017-07-09 NOTE — Telephone Encounter (Signed)
Called pt letting her know to take an extra 75mg  of irbesartan now and then beginning tomorrow, she was to change to 150mg  of irbesartan plus 20mg  lasix.  Pt expressed understanding. Scripts sent to pt's preferred pharmacy.  Nothing further needed at this current time.

## 2017-07-09 NOTE — Telephone Encounter (Signed)
Take an extra 75 mg avapro now and then tomorrow change to 150 mg daily plus lasix 20 mg daily

## 2017-07-24 ENCOUNTER — Telehealth: Payer: Self-pay | Admitting: Internal Medicine

## 2017-07-24 NOTE — Telephone Encounter (Signed)
ATC patient x3 times. Received a busy tone each time. Will try to reach her again in a few minutes.

## 2017-07-25 ENCOUNTER — Telehealth: Payer: Self-pay | Admitting: Internal Medicine

## 2017-07-25 MED ORDER — NEBIVOLOL HCL 5 MG PO TABS: ORAL_TABLET | ORAL | 0 refills | Status: DC

## 2017-07-25 NOTE — Telephone Encounter (Signed)
Spoke with [place] at [facility]. She stated that the patient needed a PA for the Bystolic. She has just faxed over the request to our office. Advised that I would look out for PA request.

## 2017-07-25 NOTE — Telephone Encounter (Signed)
Pt is calling back [phone] 

## 2017-07-25 NOTE — Telephone Encounter (Signed)
Attempted to contact pt. No answer, no option to leave a message. Will try back.  

## 2017-07-25 NOTE — Telephone Encounter (Signed)
Increase bystolic to 5 mg bid

## 2017-07-25 NOTE — Telephone Encounter (Signed)
Spoke with patient. She is aware of the medication change. Patient does not have Bystolic on hand. Will go ahead and call in medication for her to [facility].   Nothing else needed at time of call.

## 2017-07-25 NOTE — Telephone Encounter (Signed)
Spoke with patient. She stated that MW had changed her BP medication (Avapro) about 3 weeks ago. Her BP is not responding to the medication. She has been checking it at home. The highest the systolic has been was yesterday at 211.   She wants to know if she could continue the medication or switch to another one. She denies any chest pains, left arm tingling or numbness. Just elevated BP.   She wishes to use [facility].   MW, please advise. Thanks!

## 2017-07-25 NOTE — Telephone Encounter (Signed)
Pt is returning call. Cb is [phone].

## 2017-07-26 NOTE — Telephone Encounter (Signed)
Received PA request from [facility]. Will work on PA this morning. Key for [email or web address] is [name].

## 2017-07-27 NOTE — Telephone Encounter (Signed)
Checked Cover My Meds. PA is still pending. 

## 2017-07-30 NOTE — Telephone Encounter (Signed)
We will use the Bisoprilol 5 mg

## 2017-07-30 NOTE — Telephone Encounter (Signed)
Checked cmm, PA has been denied. MW is out of the office routing this to DOD.  Judson Roch would we need to do an alternative or appeal, please advise, thanks

## 2017-07-30 NOTE — Telephone Encounter (Signed)
Called and spoke with patient, she states that her pharmacy told her it had been approved. She picked up the bystolic and has 12 refills. Pharmacy said nothing further was needed.

## 2017-08-02 DIAGNOSIS — H401111 Primary open-angle glaucoma, right eye, mild stage: Secondary | ICD-10-CM | POA: Diagnosis not present

## 2017-08-02 DIAGNOSIS — H353212 Exudative age-related macular degeneration, right eye, with inactive choroidal neovascularization: Secondary | ICD-10-CM | POA: Diagnosis not present

## 2017-08-06 ENCOUNTER — Telehealth: Payer: Self-pay | Admitting: Internal Medicine

## 2017-08-06 NOTE — Telephone Encounter (Signed)
Pt is calling back [phone] 

## 2017-08-06 NOTE — Telephone Encounter (Signed)
lmtcb x1 for pt. 

## 2017-08-06 NOTE — Telephone Encounter (Signed)
Attempted to call pt. Line rang busy x3. Will try back.

## 2017-08-07 MED ORDER — BISOPROLOL FUMARATE 5 MG PO TABS: ORAL_TABLET | ORAL | 1 refills | Status: DC

## 2017-08-07 NOTE — Telephone Encounter (Signed)
Spoke with pt. She is very confused about what BP meds to take. Bystolic is not covered by her insurance company. She spoke with [place] a few weeks ago about this and was told that [place] had been approved per the pharmacy (07/25/17). This is not true per the pt. Sarah recommended that we change her Bystolic to Bisoprolol 5mg . This was not sent into pharmacy. Pt would like MW's recommendations - should she take Bisoprolol?  MW - please advise. Thanks.

## 2017-08-07 NOTE — Telephone Encounter (Signed)
Left message for patient to call back  

## 2017-08-07 NOTE — Telephone Encounter (Signed)
Spoke with patient. She was calling about her BP medication. Advised patient that MW wanted her to switch to bisoprolol 5mg  BID instead of the Bystolic since it is not covered by her insurance.   Apologized to patient in the delay since she thought this had been called in last week. She verbalized understanding. New medication has been called in.  Nothing else needed at time of call.

## 2017-08-07 NOTE — Telephone Encounter (Signed)
Bisoprolol 5 mg bid and return w/in a week with all meds and med calendar to see me or NP to recheck   D/c bystolic

## 2017-09-19 ENCOUNTER — Encounter: Payer: Self-pay | Admitting: Adult Health

## 2017-09-19 ENCOUNTER — Other Ambulatory Visit (INDEPENDENT_AMBULATORY_CARE_PROVIDER_SITE_OTHER): Payer: Medicare Other

## 2017-09-19 ENCOUNTER — Ambulatory Visit (INDEPENDENT_AMBULATORY_CARE_PROVIDER_SITE_OTHER): Payer: Medicare Other | Admitting: Adult Health

## 2017-09-19 VITALS — BP 116/64 | HR 52 | Ht 59.0 in | Wt 141.4 lb

## 2017-09-19 DIAGNOSIS — I10 Essential (primary) hypertension: Secondary | ICD-10-CM | POA: Diagnosis not present

## 2017-09-19 DIAGNOSIS — E785 Hyperlipidemia, unspecified: Secondary | ICD-10-CM | POA: Diagnosis not present

## 2017-09-19 DIAGNOSIS — E1169 Type 2 diabetes mellitus with other specified complication: Secondary | ICD-10-CM | POA: Diagnosis not present

## 2017-09-19 DIAGNOSIS — E039 Hypothyroidism, unspecified: Secondary | ICD-10-CM | POA: Diagnosis not present

## 2017-09-19 DIAGNOSIS — E669 Obesity, unspecified: Secondary | ICD-10-CM | POA: Diagnosis not present

## 2017-09-19 DIAGNOSIS — Z5181 Encounter for therapeutic drug level monitoring: Secondary | ICD-10-CM

## 2017-09-19 LAB — TSH: TSH: 3.19 u[IU]/mL (ref 0.35–4.50)

## 2017-09-19 LAB — BASIC METABOLIC PANEL
BUN: 42 mg/dL — ABNORMAL HIGH (ref 6–23)
CHLORIDE: 99 meq/L (ref 96–112)
CO2: 29 mEq/L (ref 19–32)
CREATININE: 1.66 mg/dL — AB (ref 0.40–1.20)
Calcium: 9.4 mg/dL (ref 8.4–10.5)
GFR: 30.83 mL/min — ABNORMAL LOW (ref >60.00)
Glucose, Bld: 138 mg/dL — ABNORMAL HIGH (ref 70–99)
Potassium: 4.1 mEq/L (ref 3.5–5.1)
Sodium: 139 mEq/L (ref 135–145)

## 2017-09-19 LAB — CBC WITH DIFFERENTIAL/PLATELET
BASOS PCT: 0.4 % (ref 0.0–3.0)
Basophils Absolute: 0 10*3/uL (ref 0.0–0.1)
EOS ABS: 0.3 10*3/uL (ref 0.0–0.7)
EOS PCT: 4.4 % (ref 0.0–5.0)
HCT: 31.5 % — ABNORMAL LOW (ref 36.0–46.0)
Hemoglobin: 10.9 g/dL — ABNORMAL LOW (ref 12.0–15.0)
LYMPHS ABS: 2.1 10*3/uL (ref 0.7–4.0)
Lymphocytes Relative: 31 % (ref 12.0–46.0)
MCHC: 34.7 g/dL (ref 30.0–36.0)
MCV: 88.9 fl (ref 78.0–100.0)
MONO ABS: 0.6 10*3/uL (ref 0.1–1.0)
Monocytes Relative: 8.6 % (ref 3.0–12.0)
NEUTROS ABS: 3.7 10*3/uL (ref 1.4–7.7)
NEUTROS PCT: 55.6 % (ref 43.0–77.0)
PLATELETS: 178 10*3/uL (ref 150.0–400.0)
RBC: 3.54 Mil/uL — ABNORMAL LOW (ref 3.87–5.11)
RDW: 13.8 % (ref 11.5–15.5)
WBC: 6.7 10*3/uL (ref 4.0–10.5)

## 2017-09-19 NOTE — Progress Notes (Signed)
@Patient  ID: Mikayla [name], female    DOB: 05-Apr-1926, 84 y.o.   MRN: [id number]  Chief Complaint  Patient presents with  . Follow-up    HTN , DM     Referring provider: Tanda Rockers, MD  HPI: 84 year old female, never smoker followed for obesity, diabetes, hypertension and hyperlipidemia and asthma.  TEST /PMH  ESSENTIAL HYPERTENSION, BENIGN (ICD-401.1)  Glaucoma................................................................................[name]/[name]  HYPERLIPIDEMIA (ICD-272.4)  - target LDL less than 70 because of diabetes  -Zocor 16m every other day at bedtime April 14, 2010>>>LDL 110 July 06, 2010 and no recurrent cramps  RHINITIS (ICD-472.0)  Intermittent asthma  - exac by Timolol but tolerates unless uri/flare  OSTEOPENIA (ICD-733.90)  - DEXA 12/28/07 PA Spine .655, L Hip -1.033, Lfem Neck -1.793  - DEXA 02/23/10 1.7 L Fem -1.9 Right Fem -1.8  OBESITY (ICD-278.00)  - Target wt = 148 for BMI <30  AODM (ICD-250.00)  Health Maintenance..........................................................................Marland Kitchenert  - Td Declined 05/02/2016  - Pneumovax 04/2013 / prevnar 03/03/14 - CPX 03/03/2014  - GYN Health Maint : [name], declines mammography and colonoscopy  --med calendar 06/04/2012 , 08/28/2013 , 04/20/14 , 11/23/2015 , 12/05/2016    09/19/17 Follow up : HTN , DM , Hyperlipidemia  Patient returns for a 370-monthollow-up.  Patient has underlying hypertension.  Last visit was having labile blood pressures.  She was started on bisoprolol. She said blood pressure has been better at times she does have some elevated blood pressures.  Patient did have a high blood pressure this morning upon arising.  However today in the office blood pressure was well controlled.  She did take an ibuprofen last night. Patient denies any headache, visual changes, chest pain, increased edema. Discussed a healthy low-salt diet.  Patient has diabetes last A1c was  well-controlled at 6.9.  Patient has hyperthyroidism.  She began thyroid medication earlier this year.  Patient has hyperlipidemia.  Does not like taking statin therapy.  She is trying to stay active work on a low-fat cholesterol diet.  We discussed medication compliance patient education given.  We reviewed her medications organize them into a medication count with patient education   Allergies  Allergen Reactions  . Amoxicillin Rash  . Atorvastatin     REACTION: aches    Immunization History  Administered Date(s) Administered  . Influenza Split 01/25/2011, 01/31/2012  . Influenza Whole 02/28/2007, 04/02/2008, 03/12/2009, 02/10/2010  . Influenza, High Dose Seasonal PF 02/01/2016, 03/13/2017  . Influenza,inj,Quad PF,6+ Mos 04/02/2013, 03/03/2014, 04/20/2015  . Pneumococcal Conjugate-13 03/03/2014  . Pneumococcal Polysaccharide-23 06/04/2012  . Pneumococcal-Unspecified 09/12/2000  . Td 08/13/2006  . Tdap 12/05/2016    Past Medical History:  Diagnosis Date  . Chronic rhinitis   . Disorder of bone and cartilage, unspecified   . Essential hypertension, benign   . Obesity, unspecified   . Other and unspecified hyperlipidemia   . Type II or unspecified type diabetes mellitus without mention of complication, not stated as uncontrolled     Tobacco History: Social History   Tobacco Use  Smoking Status Never Smoker  Smokeless Tobacco Never Used   Counseling given: Not Answered   Outpatient Encounter Medications as of 09/19/2017  Medication Sig  . acetaminophen (TYLENOL) 325 MG tablet Take per bottle as needed for pain  . aspirin 81 MG tablet Take 81 mg by mouth daily.   . bisoprolol (ZEBETA) 5 MG tablet Take 1 tablet by mouth twice daily  . budesonide-formoterol (SYMBICORT) 80-4.5 MCG/ACT inhaler Inhale 2 puffs into the  lungs 2 (two) times daily as needed (shortness of breath/wheezing).  . calcium carbonate (TUMS EX) 750 MG chewable tablet Chew 1 tablet by mouth 3 (three)  times daily with meals.   . Cholecalciferol (VITAMIN D) 1000 UNITS capsule Take 1,000 Units by mouth daily.    Marland Kitchen dextromethorphan (DELSYM) 30 MG/5ML liquid 2 tsp by mouth every 12 hours as needed for cough  . famotidine (PEPCID) 20 MG tablet Take 20 mg by mouth at bedtime as needed for heartburn (COUGH FLARE).   . furosemide (LASIX) 20 MG tablet Take 1 tablet (20 mg total) by mouth daily.  . Homeopathic Products ([employer] LEG CRAMPS PAIN RELIEF PO) USE AS NEEDED FOR LEGS CRAMPS  . irbesartan (AVAPRO) 150 MG tablet Take 1 tablet (150 mg total) by mouth daily.  Marland Kitchen levothyroxine (SYNTHROID) 25 MCG tablet Take 1 tablet (25 mcg total) by mouth daily before breakfast.  . loratadine (CLARITIN) 10 MG tablet Take 10 mg by mouth daily as needed (DRIPPY NOSE/WATERY EYE/INTIATIVE).   Marland Kitchen meclizine (ANTIVERT) 12.5 MG tablet Take 12.5 mg by mouth every 8 (eight) hours as needed for dizziness.  . metFORMIN (GLUCOPHAGE) 500 MG tablet TAKE 1 TABLET BY MOUTH 2 TIMES A DAY WITH A MEAL.  . Multiple Vitamins-Minerals (CENTRUM SILVER PO) Take 1 tablet by mouth daily.    . Multiple Vitamins-Minerals (ICAPS PO) Take by mouth. Take 1 tablet by mouth every morning  . omeprazole (PRILOSEC) 40 MG capsule Take 1 capsule (40 mg total) by mouth daily.  . simvastatin (ZOCOR) 20 MG tablet Take 1 tablet (20 mg total) by mouth at bedtime.  . timolol (BETIMOL) 0.25 % ophthalmic solution Place 1 drop into both eyes at bedtime.   No facility-administered encounter medications on file as of 09/19/2017.      Review of Systems  Constitutional:   No  weight loss, night sweats,  Fevers, chills, + fatigue, or  lassitude.  HEENT:   No headaches,  Difficulty swallowing,  Tooth/dental problems, or  Sore throat,                No sneezing, itching, ear ache, nasal congestion, post nasal drip,   CV:  No chest pain,  Orthopnea, PND, swelling in lower extremities, anasarca, dizziness, palpitations, syncope.   GI  No heartburn, indigestion,  abdominal pain, nausea, vomiting, diarrhea, change in bowel habits, loss of appetite, bloody stools.   Resp:    No chest wall deformity  Skin: no rash or lesions.  GU: no dysuria, change in color of urine, no urgency or frequency.  No flank pain, no hematuria   MS:  No joint pain or swelling.  No decreased range of motion.  No back pain.    Physical Exam  BP 116/64 (BP Location: Left Arm, Cuff Size: Normal)   Pulse (!) 52   Ht 4' 11"  (1.499 m)   Wt 141 lb 6.4 oz (64.1 kg)   SpO2 99%   BMI 28.56 kg/m   GEN: A/Ox3; pleasant , NAD, elderly    HEENT:  Happy/AT,  EACs-clear, TMs-wnl, NOSE-clear, THROAT-clear, no lesions, no postnasal drip or exudate noted.   NECK:  Supple w/ fair ROM; no JVD; normal carotid impulses w/o bruits; no thyromegaly or nodules palpated; no lymphadenopathy.    RESP  Clear  P & A; w/o, wheezes/ rales/ or rhonchi. no accessory muscle use, no dullness to percussion  CARD:  RRR, no m/r/g, no peripheral edema, pulses intact, no cyanosis or clubbing.  GI:  Soft & nt; nml bowel sounds; no organomegaly or masses detected.   Musco: Warm bil, no deformities or joint swelling noted.   Neuro: alert, no focal deficits noted.    Skin: Warm, no lesions or rashes    Lab Results:  CBC    Component Value Date/Time   WBC 6.7 09/19/2017 1032   RBC 3.54 (L) 09/19/2017 1032   HGB 10.9 (L) 09/19/2017 1032   HCT 31.5 (L) 09/19/2017 1032   PLT 178.0 09/19/2017 1032   MCV 88.9 09/19/2017 1032   MCHC 34.7 09/19/2017 1032   RDW 13.8 09/19/2017 1032   LYMPHSABS 2.1 09/19/2017 1032   MONOABS 0.6 09/19/2017 1032   EOSABS 0.3 09/19/2017 1032   BASOSABS 0.0 09/19/2017 1032    BMET    Component Value Date/Time   NA 139 09/19/2017 1032   K 4.1 09/19/2017 1032   CL 99 09/19/2017 1032   CO2 29 09/19/2017 1032   GLUCOSE 138 (H) 09/19/2017 1032   GLUCOSE 126 (H) 04/23/2006 1043   BUN 42 (H) 09/19/2017 1032   CREATININE 1.66 (H) 09/19/2017 1032   CALCIUM 9.4  09/19/2017 1032   GFRNONAA 117.17 04/13/2010 1042   GFRAA  07/10/2008 1028    >60        The eGFR has been calculated using the MDRD equation. This calculation has not been validated in all clinical situations. eGFR's persistently <60 mL/min signify possible Chronic Kidney Disease.    BNP No results found for: BNP  ProBNP No results found for: PROBNP  Imaging: No results found.   Assessment & Plan:   No problem-specific Assessment & Plan notes found for this encounter.     Rexene Edison, NP

## 2017-09-19 NOTE — Patient Instructions (Addendum)
Stop Ibuprofen .  Labs today .  Low fat cholesterol diet. Keep sweets low.  Follow up Dr. Melvyn Novas  In 3- months and As needed   Please contact office for sooner follow up if symptoms do not improve or worsen or seek emergency care  Follow med calendar closely and bring to each visit

## 2017-09-20 NOTE — Assessment & Plan Note (Signed)
LDL goal <100  encouarged on statin  Diet discussed

## 2017-09-20 NOTE — Assessment & Plan Note (Signed)
Well controlled  Diet discussed  No changes

## 2017-09-20 NOTE — Assessment & Plan Note (Signed)
No changes  Check tsh

## 2017-09-20 NOTE — Assessment & Plan Note (Signed)
B/p is controlled at office  B/p log shows labile pressures.  Advised on diet and low salt intake Avoid NSAIDS   Plan  Patient Instructions  Stop Ibuprofen .  Labs today .  Low fat cholesterol diet. Keep sweets low.  Follow up Dr. Melvyn Novas  In 3- months and As needed   Please contact office for sooner follow up if symptoms do not improve or worsen or seek emergency care  Follow med calendar closely and bring to each visit

## 2017-09-27 ENCOUNTER — Telehealth: Payer: Self-pay | Admitting: Internal Medicine

## 2017-09-27 NOTE — Telephone Encounter (Signed)
Called patient, unable to reach left message to give us a call back. 

## 2017-09-28 NOTE — Telephone Encounter (Signed)
Pt is calling back [phone] pt want's a detailed message left on her VM if she doesn't answer.

## 2017-09-28 NOTE — Telephone Encounter (Signed)
Stool cards are kept on b-side in the file cabinet, top drawer

## 2017-09-28 NOTE — Telephone Encounter (Signed)
Called patient back and reviewed results with patient per TP's result note. Patient had several questions. Reviewed with her which medications fall under NSAIDs and advised her not to continue to take those. Patient verbalized understanding and reports she doesn't have further questions.   Routing to JJ to check about stool cards.

## 2017-09-28 NOTE — Telephone Encounter (Signed)
Cards have been placed in the mail for the patient. Nothing further needed.

## 2017-10-02 ENCOUNTER — Ambulatory Visit (INDEPENDENT_AMBULATORY_CARE_PROVIDER_SITE_OTHER): Payer: Medicare Other | Admitting: Gastroenterology

## 2017-10-02 ENCOUNTER — Encounter: Payer: Self-pay | Admitting: Gastroenterology

## 2017-10-02 VITALS — BP 200/68 | HR 56 | Ht 58.5 in | Wt 141.5 lb

## 2017-10-02 DIAGNOSIS — K222 Esophageal obstruction: Secondary | ICD-10-CM | POA: Diagnosis not present

## 2017-10-02 DIAGNOSIS — K219 Gastro-esophageal reflux disease without esophagitis: Secondary | ICD-10-CM | POA: Diagnosis not present

## 2017-10-02 MED ORDER — OMEPRAZOLE 40 MG PO CPDR: 40.0000 mg | DELAYED_RELEASE_CAPSULE | Freq: Every day | ORAL | 11 refills | Status: DC

## 2017-10-02 NOTE — Patient Instructions (Addendum)
Omeprazole 40mg  pill, one pill daily before breakfast, 30 pills, 11 refills. Return in 2 years if you still need prescription PPI Continue to chew your food well, eat slowly and take small bites.  Normal BMI (Body Mass Index- based on height and weight) is between 23 and 30. Your BMI today is Body mass index is 29.07 kg/m. Marland Kitchen Please consider follow up  regarding your BMI with your Primary Care Provider.

## 2017-10-02 NOTE — Progress Notes (Signed)
Review of pertinent gastrointestinal problems: 1.  Esophageal stricture, likely peptic related to chronic acid.  She presented 2015 with an overt food impaction treated with EGD FB removal 04/2014. Marland Kitchen  Repeat EGD January 2016 found the focal peptic stricture.  This was benign-appearing.  This was above a 3 to 4 cm hiatal hernia.  The stricture was dilated with balloon inflated to 20 mm with good results and she was recommended to take prescription omeprazole once daily indefinitely.  HPI: This is a very pleasant 83 year old woman whom I last saw about 3 years ago at the time of repeat upper endoscopy.    Chief complaint is GERD  As long as she takes her  prescription strength omeprazole, she feels fine.  She does concentrate on eating slowly, taking small bites and chewing her food very well.  Her weight has been overall stable.  ROS: complete GI ROS as described in HPI, all other review negative.  Constitutional:  No unintentional weight loss   Past Medical History:  Diagnosis Date  . Chronic rhinitis   . Disorder of bone and cartilage, unspecified   . Essential hypertension, benign   . Obesity, unspecified   . Other and unspecified hyperlipidemia   . Type II or unspecified type diabetes mellitus without mention of complication, not stated as uncontrolled     Past Surgical History:  Procedure Laterality Date  . CESAREAN SECTION N/A   . ESOPHAGOGASTRODUODENOSCOPY N/A 04/29/2014   Procedure: ESOPHAGOGASTRODUODENOSCOPY (EGD);  Surgeon: Milus Banister, MD;  Location: [facility];  Service: Endoscopy;  Laterality: N/A;  . TOTAL ABDOMINAL HYSTERECTOMY      Current Outpatient Medications  Medication Sig Dispense Refill  . acetaminophen (TYLENOL) 325 MG tablet Take per bottle as needed for pain    . aspirin 81 MG tablet Take 81 mg by mouth daily.     . bisoprolol (ZEBETA) 5 MG tablet Take 1 tablet by mouth twice daily 60 tablet 1  . budesonide-formoterol (SYMBICORT) 80-4.5  MCG/ACT inhaler Inhale 2 puffs into the lungs 2 (two) times daily as needed (shortness of breath/wheezing). 1 Inhaler 0  . calcium carbonate (TUMS EX) 750 MG chewable tablet Chew 1 tablet by mouth 3 (three) times daily with meals.     . Cholecalciferol (VITAMIN D) 1000 UNITS capsule Take 1,000 Units by mouth daily.      Marland Kitchen dextromethorphan (DELSYM) 30 MG/5ML liquid 2 tsp by mouth every 12 hours as needed for cough    . famotidine (PEPCID) 20 MG tablet Take 20 mg by mouth at bedtime as needed for heartburn (COUGH FLARE).     . furosemide (LASIX) 20 MG tablet Take 1 tablet (20 mg total) by mouth daily. 30 tablet 12  . Homeopathic Products ([employer]S LEG CRAMPS PAIN RELIEF PO) USE AS NEEDED FOR LEGS CRAMPS    . irbesartan (AVAPRO) 150 MG tablet Take 1 tablet (150 mg total) by mouth daily. 30 tablet 12  . levothyroxine (SYNTHROID) 25 MCG tablet Take 1 tablet (25 mcg total) by mouth daily before breakfast. 30 tablet 5  . loratadine (CLARITIN) 10 MG tablet Take 10 mg by mouth daily as needed (DRIPPY NOSE/WATERY EYE/INTIATIVE).     Marland Kitchen metFORMIN (GLUCOPHAGE) 500 MG tablet TAKE 1 TABLET BY MOUTH 2 TIMES A DAY WITH A MEAL. 180 tablet 1  . Multiple Vitamins-Minerals (CENTRUM SILVER PO) Take 1 tablet by mouth daily.      . Multiple Vitamins-Minerals (ICAPS PO) Take by mouth. Take 1 tablet by mouth every morning    .  omeprazole (PRILOSEC) 40 MG capsule Take 1 capsule (40 mg total) by mouth daily. 90 capsule 0  . simvastatin (ZOCOR) 20 MG tablet Take 1 tablet (20 mg total) by mouth at bedtime. 90 tablet 3  . timolol (BETIMOL) 0.25 % ophthalmic solution Place 1 drop into both eyes at bedtime.     No current facility-administered medications for this visit.     Allergies as of 10/02/2017 - Review Complete 10/02/2017  Allergen Reaction Noted  . Amoxicillin Rash 03/28/2011  . Lipitor [atorvastatin]      Family History  Problem Relation Age of Onset  . COPD Mother   . Lung cancer Father   . Prostate cancer  Unknown   . Colon cancer Neg Hx   . Esophageal cancer Neg Hx   . Rectal cancer Neg Hx   . Stomach cancer Neg Hx     Social History   Socioeconomic History  . Marital status: Married    Spouse name: Not on file  . Number of children: Not on file  . Years of education: Not on file  . Highest education level: Not on file  Occupational History  . Not on file  Social Needs  . Financial resource strain: Not on file  . Food insecurity:    Worry: Not on file    Inability: Not on file  . Transportation needs:    Medical: Not on file    Non-medical: Not on file  Tobacco Use  . Smoking status: Never Smoker  . Smokeless tobacco: Never Used  Substance and Sexual Activity  . Alcohol use: Yes    Alcohol/week: 0.0 oz    Comment: wine with dinner  . Drug use: No  . Sexual activity: Not on file  Lifestyle  . Physical activity:    Days per week: Not on file    Minutes per session: Not on file  . Stress: Not on file  Relationships  . Social connections:    Talks on phone: Not on file    Gets together: Not on file    Attends religious service: Not on file    Active member of club or organization: Not on file    Attends meetings of clubs or organizations: Not on file    Relationship status: Not on file  . Intimate partner violence:    Fear of current or ex partner: Not on file    Emotionally abused: Not on file    Physically abused: Not on file    Forced sexual activity: Not on file  Other Topics Concern  . Not on file  Social History Narrative  . Not on file     Physical Exam: BP (!) 200/68 (BP Location: Left Arm, Patient Position: Sitting, Cuff Size: Normal)   Pulse (!) 56   Ht 4' 10.5" (1.486 m) Comment: height measured without shoes  Wt 141 lb 8 oz (64.2 kg)   BMI 29.07 kg/m  Constitutional: generally well-appearing Psychiatric: alert and oriented x3 Abdomen: soft, nontender, nondistended, no obvious ascites, no peritoneal signs, normal bowel sounds No peripheral  edema noted in lower extremities  Assessment and plan: 84 y.o. female with GERD, peptic stricture previously  She is 84 years old and looks great.  As long as she takes her prescription proton pump inhibitor and eat slowly she has no trouble with dysphasia.  Her GERD symptoms are under very good control.  I am happy to refill the proton pump inhibitor for another 2 years.  However she still  requires prescription strength medicine after 2 years I would like to see her in the office in follow-up.  Please see the "Patient Instructions" section for addition details about the plan.  Owens Loffler, MD [facility] 10/02/2017, 9:59 AM

## 2017-10-15 ENCOUNTER — Other Ambulatory Visit: Payer: Self-pay | Admitting: Internal Medicine

## 2017-11-20 DIAGNOSIS — H353122 Nonexudative age-related macular degeneration, left eye, intermediate dry stage: Secondary | ICD-10-CM | POA: Diagnosis not present

## 2017-11-20 DIAGNOSIS — H353211 Exudative age-related macular degeneration, right eye, with active choroidal neovascularization: Secondary | ICD-10-CM | POA: Diagnosis not present

## 2017-11-29 DIAGNOSIS — H353221 Exudative age-related macular degeneration, left eye, with active choroidal neovascularization: Secondary | ICD-10-CM | POA: Diagnosis not present

## 2017-12-18 ENCOUNTER — Other Ambulatory Visit (INDEPENDENT_AMBULATORY_CARE_PROVIDER_SITE_OTHER): Payer: Medicare Other

## 2017-12-18 ENCOUNTER — Ambulatory Visit (INDEPENDENT_AMBULATORY_CARE_PROVIDER_SITE_OTHER): Payer: Medicare Other | Admitting: Internal Medicine

## 2017-12-18 ENCOUNTER — Encounter: Payer: Self-pay | Admitting: Internal Medicine

## 2017-12-18 ENCOUNTER — Other Ambulatory Visit: Payer: Self-pay | Admitting: Internal Medicine

## 2017-12-18 VITALS — BP 164/70 | HR 66 | Ht 58.5 in | Wt 139.6 lb

## 2017-12-18 DIAGNOSIS — E669 Obesity, unspecified: Secondary | ICD-10-CM | POA: Diagnosis not present

## 2017-12-18 DIAGNOSIS — E039 Hypothyroidism, unspecified: Secondary | ICD-10-CM

## 2017-12-18 DIAGNOSIS — D649 Anemia, unspecified: Secondary | ICD-10-CM | POA: Diagnosis not present

## 2017-12-18 DIAGNOSIS — I10 Essential (primary) hypertension: Secondary | ICD-10-CM

## 2017-12-18 DIAGNOSIS — E1169 Type 2 diabetes mellitus with other specified complication: Secondary | ICD-10-CM | POA: Diagnosis not present

## 2017-12-18 LAB — HEMOGLOBIN A1C: Hgb A1c MFr Bld: 7.1 % — ABNORMAL HIGH (ref 4.6–6.5)

## 2017-12-18 LAB — BASIC METABOLIC PANEL
BUN: 39 mg/dL — ABNORMAL HIGH (ref 6–23)
CHLORIDE: 101 meq/L (ref 96–112)
CO2: 29 meq/L (ref 19–32)
CREATININE: 1.49 mg/dL — AB (ref 0.40–1.20)
Calcium: 9.8 mg/dL (ref 8.4–10.5)
GFR: 34.91 mL/min — ABNORMAL LOW (ref >60.00)
Glucose, Bld: 154 mg/dL — ABNORMAL HIGH (ref 70–99)
POTASSIUM: 4.9 meq/L (ref 3.5–5.1)
Sodium: 137 mEq/L (ref 135–145)

## 2017-12-18 MED ORDER — AMLODIPINE BESYLATE 5 MG PO TABS: 5.0000 mg | ORAL_TABLET | Freq: Every day | ORAL | 11 refills | Status: DC

## 2017-12-18 MED ORDER — BISOPROLOL FUMARATE 5 MG PO TABS: 5.0000 mg | ORAL_TABLET | Freq: Every day | ORAL | Status: DC

## 2017-12-18 NOTE — Patient Instructions (Addendum)
You need your drug formulary from insurance when you return   Change bisorpolol to 5 mg one daily   Add amlopdine 5 mg one daily    Please remember to go to the lab department downstairs in the basement  for your tests - we will call you with the results when they are available.      See [name] in 4 weeks with all your medications, even over the counter meds, separated in two separate bags, the ones you take no matter what vs the ones you stop once you feel better and take only as needed when you feel you need them.   Mikayla [name]  will generate for you a new user friendly medication calendar that will put [place] all on the same page re: your medication use.   - recheck hgb next ov

## 2017-12-18 NOTE — Progress Notes (Signed)
Left detailed msg on her machine

## 2017-12-18 NOTE — Progress Notes (Signed)
Subjective:     Patient ID: Mikayla [name], female   DOB: [birth year]     MRN: [id number]  Brief patient profile:  32 yowf never smoker with moderate obesity> adult onset diabetes dx 2002 and hyperlipidemia and hbp/ asthma     History of Present Illness  06/11/2015 acute extended ov/Hersey Maclellan re: cough / no med calendar  Chief Complaint  Patient presents with  . Acute Visit    Pt c/o cough x 1 wk- occ prod with minimal clear sputum.   only used symbcort once or twice since onset/ still on timoptic  Felt the worst on 06/06/15 but persistent cough noct/ min need for otcs and no sob rec zpak          06/26/2017  f/u ov/[place]o Rothgeb re: f/u hbp/ hyperlipid/ dm  Chief Complaint  Patient presents with  . Follow-up    Doing well and no new co's.    Dyspnea:  No/ shopping housework walking dog some hills Cough: no Sleep: ok  No tia/ claudication / am HA  rec Please remember to go to the lab department downstairs in the basement  for your tests - we will call you with the results when they are available. Take all medications before your visits the metformin (must be taken with meals) Start monitoring your blood pressure several times a week at different times of the day         NP eval/ T Parrett 09/19/17  rec Stop Ibuprofen .  Labs today .  Low fat cholesterol diet. Keep sweets low.     12/18/2017  f/u ov/Kearston Putman re:  HBP/ hyperlipidemia/ / dm  Chief Complaint  Patient presents with  . Follow-up   Dyspnea:  Not limited by breathing from desired activities  But more sluggish on bid bisoprolol Cough: none  Sleeping: fine flat  Not checking bp or CBG's between ov's and not interested at all in doing so > no overt symptoms of high or low sugars    No obvious day to day or daytime variability or assoc excess/ purulent sputum or mucus plugs or hemoptysis or cp or chest tightness, subjective wheeze or overt sinus or hb symptoms.   Sleeping fine flat  without nocturnal  or early am  exacerbation  of respiratory  c/o's or need for noct saba. Also denies any obvious fluctuation of symptoms with weather or environmental changes or other aggravating or alleviating factors except as outlined above   No unusual exposure hx or h/o childhood pna/ asthma or knowledge of premature birth.  Current Allergies, Complete Past Medical History, Past Surgical History, Family History, and Social History were reviewed in [employer] record.  ROS  The following are not active complaints unless bolded Hoarseness, sore throat, dysphagia, dental problems, itching, sneezing,  nasal congestion or discharge of excess mucus or purulent secretions, ear ache,   fever, chills, sweats, unintended wt loss or wt gain, classically pleuritic or exertional cp,  orthopnea pnd or arm/hand swelling  or leg swelling, presyncope, palpitations, abdominal pain, anorexia, nausea, vomiting, diarrhea  or change in bowel habits or change in bladder habits, change in stools or change in urine, dysuria, hematuria,  rash, arthralgias, visual complaints, headache, numbness, weakness or ataxia or problems with walking or coordination,  change in mood or  memory.        Current Meds  Medication Sig  . acetaminophen (TYLENOL) 325 MG tablet Take per bottle as needed for pain  . aspirin 81 MG  tablet Take 81 mg by mouth daily.   . budesonide-formoterol (SYMBICORT) 80-4.5 MCG/ACT inhaler Inhale 2 puffs into the lungs 2 (two) times daily as needed (shortness of breath/wheezing).  . calcium carbonate (TUMS EX) 750 MG chewable tablet Chew 1 tablet by mouth 3 (three) times daily with meals.   . Cholecalciferol (VITAMIN D) 1000 UNITS capsule Take 1,000 Units by mouth daily.    Marland Kitchen dextromethorphan (DELSYM) 30 MG/5ML liquid 2 tsp by mouth every 12 hours as needed for cough  . famotidine (PEPCID) 20 MG tablet Take 20 mg by mouth at bedtime as needed for heartburn (COUGH FLARE).   . furosemide (LASIX) 20 MG tablet Take 1  tablet (20 mg total) by mouth daily.  . Homeopathic Products ([employer] LEG CRAMPS PAIN RELIEF PO) USE AS NEEDED FOR LEGS CRAMPS  . irbesartan (AVAPRO) 150 MG tablet Take 1 tablet (150 mg total) by mouth daily.  Marland Kitchen levothyroxine (SYNTHROID) 25 MCG tablet Take 1 tablet (25 mcg total) by mouth daily before breakfast.  . loratadine (CLARITIN) 10 MG tablet Take 10 mg by mouth daily as needed (DRIPPY NOSE/WATERY EYE/INTIATIVE).   Marland Kitchen metFORMIN (GLUCOPHAGE) 500 MG tablet TAKE 1 TABLET BY MOUTH 2 TIMES A DAY WITH A MEAL.  . Multiple Vitamins-Minerals (CENTRUM SILVER PO) Take 1 tablet by mouth daily.    . Multiple Vitamins-Minerals (ICAPS PO) Take by mouth. Take 1 tablet by mouth every morning  . omeprazole (PRILOSEC) 40 MG capsule Take 1 capsule (40 mg total) by mouth daily.  . simvastatin (ZOCOR) 20 MG tablet Take 1 tablet (20 mg total) by mouth at bedtime.  . timolol (BETIMOL) 0.25 % ophthalmic solution Place 1 drop into both eyes at bedtime.  . [DISCONTINUED] bisoprolol (ZEBETA) 5 MG tablet TAKE 1 TABLET BY MOUTH TWICE DAILY                       Past Medical History:  ESSENTIAL HYPERTENSION, BENIGN (ICD-401.1)  Glaucoma................................................................................[name]/[name]  HYPERLIPIDEMIA (ICD-272.4)  - target LDL less than 70 because of diabetes  -Zocor 20mg  every other day at bedtime April 14, 2010>>> LDL 110 July 06, 2010 and no recurrent  cramps  RHINITIS (ICD-472.0)  Intermittent asthma  - exac by Timolol but tolerates unless uri/flare  OSTEOPENIA (ICD-733.90)  - DEXA 12/28/07 PA Spine .655, L Hip -1.033, Lfem Neck -1.793  - DEXA 02/23/10 1.7 L Fem -1.9 Right Fem -1.8  OBESITY (ICD-278.00)  - Target wt = 148 for BMI < 30  AODM (ICD-250.00)  Health Maintenance.........................................................................Marland KitchenWert  - Td  12/05/16   - Pneumovax 04/2013 /  prevnar 03/03/14 - CPX 03/03/2014  - GYN Health Maint : GYN  [name], declines mammography and colonoscopy  --med calendar 06/04/2012 , 08/28/2013 , 04/20/14 , 11/23/2015        Objective:   Physical Exam   amb wf nad  Vital signs reviewed - Note on arrival 02 sats  95% on RA and bp 164/70 p am meds Pulse 66      wt 159 November 05, 2008 >162 April 13, 2010 > 159 July 06, 2010 > 151 10/11/2010 >150 12/01/2010 > 150 01/11/2011 > 04/27/2011  148> 148 07/31/2011 > 10/30/2011  148 > 01/31/2012  147 >143 06/04/2012 > 09/23/2012  149> 01/01/13  142 > 04/03/2013  140> 03/03/2014 141 >145 03/17/2014 >>142 04/20/2014 > 05/11/2014 >141 >141 06/11/14 > 09/22/2014 142 > 12/29/2014 139 > 04/20/2015  144 > 06/11/2015  142 >  08/24/2015  143 > 02/01/2016  144 > 05/02/2016  142 > 03/13/2017  139 >  06/26/2017  144 > 12/18/2017     HEENT: nl dentition, turbinates bilaterally, and oropharynx. Nl external ear canals without cough reflex   NECK :  without JVD/Nodes/TM/ nl carotid upstrokes bilaterally   LUNGS: no acc muscle use,  Nl contour chest which is clear to A and P bilaterally without cough on insp or exp maneuvers   CV:  RRR  no s3 or murmur or increase in P2, and no edema   ABD:  soft and nontender with nl inspiratory excursion in the supine position. No bruits or organomegaly appreciated, bowel sounds nl  MS:  Nl gait/ ext warm without deformities, calf tenderness, cyanosis or clubbing No obvious joint restrictions   SKIN: warm and dry without lesions    NEURO:  alert, approp, nl sensorium with  no motor or cerebellar deficits apparent.        Labs ordered/ reviewed:      Chemistry      Component Value Date/Time   NA 137 12/18/2017 1016   K 4.9 12/18/2017 1016   CL 101 12/18/2017 1016   CO2 29 12/18/2017 1016   BUN 39 (H) 12/18/2017 1016   CREATININE 1.49 (H) 12/18/2017 1016      Component Value Date/Time   CALCIUM 9.8 12/18/2017 1016                             Lab Results  Component Value Date   WBC 6.7 09/19/2017   HGB 10.9 (L) 09/19/2017    HCT 31.5 (L) 09/19/2017   MCV 88.9 09/19/2017   PLT 178.0 09/19/2017         Lab Results  Component Value Date   TSH 3.19 09/19/2017        Lab Results  Component Value Date   HGB 10.9 (L) 09/19/2017   HGB 11.5 (L) 02/01/2016   HGB 11.5 (L) 09/22/2014    Lab Results  Component Value Date   HGBA1C 7.1 (H) 12/18/2017   HGBA1C 6.9 (H) 06/26/2017   HGBA1C 6.6 (H) 03/13/2017

## 2017-12-19 ENCOUNTER — Encounter: Payer: Self-pay | Admitting: Internal Medicine

## 2017-12-19 DIAGNOSIS — D649 Anemia, unspecified: Secondary | ICD-10-CM | POA: Insufficient documentation

## 2017-12-19 NOTE — Assessment & Plan Note (Addendum)
Lab Results  Component Value Date   CREATININE 1.49 (H) 12/18/2017   CREATININE 1.66 (H) 09/19/2017   CREATININE 1.00 06/26/2017    Over beta blocked so reduce the bisoprolol to 5 mg one qam and add amlodipine 5 mg daily, return in one m

## 2017-12-19 NOTE — Assessment & Plan Note (Signed)
Adequate control on present rx, reviewed in detail with pt > no change in rx needed   

## 2017-12-19 NOTE — Assessment & Plan Note (Signed)
Reasonably Adequate control on present rx, reviewed in detail with pt > no change in rx needed

## 2017-12-19 NOTE — Assessment & Plan Note (Addendum)
Very mild s obvious source, may contribute to fatigue and sensitivty to BB since increased co  would normally compensate for decrease hgb > recheck next ov/ consider w/u    I had an extended discussion with the patient reviewing all relevant studies completed to date and  lasting 15 to 20 minutes of a 25 minute visit    Each maintenance medication was reviewed in detail including most importantly the difference between maintenance and prns and under what circumstances the prns are to be triggered using an action plan format that is not reflected in the computer generated alphabetically organized AVS but trather by a customized med calendar that reflects the AVS meds with confirmed 100% correlation.   In addition, Please see AVS for unique instructions that I personally wrote and verbalized to the the pt in detail and then reviewed with pt  by my nurse highlighting any  changes in therapy recommended at today's visit to their plan of care.

## 2017-12-24 ENCOUNTER — Other Ambulatory Visit: Payer: Self-pay | Admitting: Internal Medicine

## 2018-01-08 DIAGNOSIS — H353221 Exudative age-related macular degeneration, left eye, with active choroidal neovascularization: Secondary | ICD-10-CM | POA: Diagnosis not present

## 2018-01-15 ENCOUNTER — Encounter: Payer: Medicare Other | Admitting: Adult Health

## 2018-01-16 ENCOUNTER — Telehealth: Payer: Self-pay | Admitting: Internal Medicine

## 2018-01-16 NOTE — Telephone Encounter (Signed)
Spoke with patient, states she is having pain over her left buttock, similar to pain of muscle strain, unable to take Ibuprofen, would like a muscle relaxer or something for pain. She noticed the pain after working out in her garden and has had the pain x 1 week.   MW please advise.

## 2018-01-16 NOTE — Telephone Encounter (Signed)
ATC patient. VM was not setup and she did not answer. Will call back later today.

## 2018-01-16 NOTE — Telephone Encounter (Signed)
ATC x2--received busy dial.  Will call back 

## 2018-01-16 NOTE — Telephone Encounter (Signed)
flexoril 10 mg qid prn  #16 with one refill and refer to ortho if not better in a day or two Keep eye out for rash suggestive of shingles

## 2018-01-17 MED ORDER — CYCLOBENZAPRINE HCL 10 MG PO TABS: 10.0000 mg | ORAL_TABLET | Freq: Three times a day (TID) | ORAL | 1 refills | Status: DC | PRN

## 2018-01-17 NOTE — Telephone Encounter (Signed)
Spoke with pt in the lobby and she wanted to know what the result was from her message for her back pain. I advised her that Dr. Melvyn Novas wanted to send in [place] to her pharmacy for the muscle spasm. Pt agreed and I called the Rx into her pharmacy at [facility]. Nothing further is needed.

## 2018-01-29 ENCOUNTER — Ambulatory Visit (INDEPENDENT_AMBULATORY_CARE_PROVIDER_SITE_OTHER): Payer: Medicare Other | Admitting: Adult Health

## 2018-01-29 ENCOUNTER — Encounter: Payer: Self-pay | Admitting: Adult Health

## 2018-01-29 DIAGNOSIS — E1169 Type 2 diabetes mellitus with other specified complication: Secondary | ICD-10-CM

## 2018-01-29 DIAGNOSIS — Z23 Encounter for immunization: Secondary | ICD-10-CM

## 2018-01-29 DIAGNOSIS — E119 Type 2 diabetes mellitus without complications: Secondary | ICD-10-CM

## 2018-01-29 DIAGNOSIS — I10 Essential (primary) hypertension: Secondary | ICD-10-CM | POA: Diagnosis not present

## 2018-01-29 DIAGNOSIS — E669 Obesity, unspecified: Secondary | ICD-10-CM

## 2018-01-29 NOTE — Patient Instructions (Signed)
Low fat cholesterol diet. Low salt and sweet diet .  Follow medication calendar closely and bring to each visit.  Follow up Dr. Melvyn Novas  In 3- months and As needed   Please contact office for sooner follow up if symptoms do not improve or worsen or seek emergency care

## 2018-01-29 NOTE — Progress Notes (Signed)
@Patient  ID: Mikayla [name], female    DOB: 01-01-1926, 84 y.o.   MRN: [id number]  Chief Complaint  Patient presents with  . Follow-up    HTN     Referring provider: Tanda Rockers, MD  HPI: 84 year old female, never smoker followed for obesity, diabetes, hypertension and hyperlipidemia and asthma.  TEST /PMH ESSENTIAL HYPERTENSION, BENIGN (ICD-401.1)  Glaucoma................................................................................[name]/[name]  HYPERLIPIDEMIA (ICD-272.4)  - target LDL less than 70 because of diabetes  -Zocor 81m every other day at bedtime April 14, 2010>>>LDL 110 July 06, 2010 and no recurrent cramps  RHINITIS (ICD-472.0)  Intermittent asthma  - exac by Timolol but tolerates unless uri/flare  OSTEOPENIA (ICD-733.90)  - DEXA 12/28/07 PA Spine .655, L Hip -1.033, Lfem Neck -1.793  - DEXA 02/23/10 1.7 L Fem -1.9 Right Fem -1.8  OBESITY (ICD-278.00)  - Target wt = 148 for BMI <30  AODM (ICD-250.00)  Health Maintenance..........................................................................Marland Kitchenert  - Td Declined 05/02/2016  - Pneumovax 04/2013 / prevnar 03/03/14 - CPX 03/03/2014  - GYN Health Maint : GYN [name], declines mammography and colonoscopy  --med calendar 06/04/2012 , 08/28/2013 , 04/20/14 , 11/23/2015,12/05/2016  01/29/2018 Follow up : HTN, DM , Hyperlipidemia , Med Review  Patient returns for a one-month follow-up.  Last visit patient was having difficulty with elevated blood pressure.  Amlodipine 5 mg was added.  Since last visit patient says she is doing better.  Blood pressure today is much improved at 122/60 . She denies any headache visual changes or speech trouble.  We discussed a low-salt diet.  Avoidance of nonsteroidals.  We reviewed all her medications and organize them into a medication count with patient education.  It appears she is taking her medications correctly.  Lab work last visit showed diabetes is  under good control A1c was 7.1. Discussed a low sweet diet.  Says she recently lost her dog last week.  Support was provided. She remains active with light housework and gardening work.  She goes to church and has a good family and friend network.  Allergies  Allergen Reactions  . Amoxicillin Rash  . Lipitor [Atorvastatin]     REACTION: aches    Immunization History  Administered Date(s) Administered  . Influenza Split 01/25/2011, 01/31/2012  . Influenza Whole 02/28/2007, 04/02/2008, 03/12/2009, 02/10/2010  . Influenza, High Dose Seasonal PF 02/01/2016, 03/13/2017, 01/29/2018  . Influenza,inj,Quad PF,6+ Mos 04/02/2013, 03/03/2014, 04/20/2015  . Pneumococcal Conjugate-13 03/03/2014  . Pneumococcal Polysaccharide-23 06/04/2012  . Pneumococcal-Unspecified 09/12/2000  . Td 08/13/2006  . Tdap 12/05/2016    Past Medical History:  Diagnosis Date  . Chronic rhinitis   . Disorder of bone and cartilage, unspecified   . Essential hypertension, benign   . Obesity, unspecified   . Other and unspecified hyperlipidemia   . Type II or unspecified type diabetes mellitus without mention of complication, not stated as uncontrolled     Tobacco History: Social History   Tobacco Use  Smoking Status Never Smoker  Smokeless Tobacco Never Used   Counseling given: Not Answered   Outpatient Medications Prior to Visit  Medication Sig Dispense Refill  . acetaminophen (TYLENOL) 325 MG tablet Take per bottle as needed for pain    . amLODipine (NORVASC) 5 MG tablet Take 1 tablet (5 mg total) by mouth daily. 30 tablet 11  . aspirin 81 MG tablet Take 81 mg by mouth daily.     . bisoprolol (ZEBETA) 5 MG tablet Take 1 tablet (5 mg total) by mouth daily.    .Marland Kitchen  bisoprolol (ZEBETA) 5 MG tablet TAKE 1 TABLET BY MOUTH TWICE DAILY 60 tablet 11  . budesonide-formoterol (SYMBICORT) 80-4.5 MCG/ACT inhaler Inhale 2 puffs into the lungs 2 (two) times daily as needed (shortness of breath/wheezing). 1 Inhaler 0    . calcium carbonate (TUMS EX) 750 MG chewable tablet Chew 1 tablet by mouth 3 (three) times daily with meals.     . Cholecalciferol (VITAMIN D) 1000 UNITS capsule Take 1,000 Units by mouth daily.      . cyclobenzaprine (FLEXERIL) 10 MG tablet Take 1 tablet (10 mg total) by mouth 3 (three) times daily as needed for muscle spasms. 30 tablet 1  . dextromethorphan (DELSYM) 30 MG/5ML liquid 2 tsp by mouth every 12 hours as needed for cough    . famotidine (PEPCID) 20 MG tablet Take 20 mg by mouth at bedtime as needed for heartburn (COUGH FLARE).     . furosemide (LASIX) 20 MG tablet Take 1 tablet (20 mg total) by mouth daily. 30 tablet 12  . Homeopathic Products ([employer] LEG CRAMPS PAIN RELIEF PO) USE AS NEEDED FOR LEGS CRAMPS    . irbesartan (AVAPRO) 150 MG tablet Take 1 tablet (150 mg total) by mouth daily. 30 tablet 12  . levothyroxine (SYNTHROID, LEVOTHROID) 25 MCG tablet TAKE 1 TABLET (25 MCG TOTAL) BY MOUTH DAILY BEFORE BREAKFAST. 90 tablet 1  . loratadine (CLARITIN) 10 MG tablet Take 10 mg by mouth daily as needed (DRIPPY NOSE/WATERY EYE/INTIATIVE).     Marland Kitchen metFORMIN (GLUCOPHAGE) 500 MG tablet TAKE 1 TABLET BY MOUTH 2 TIMES A DAY WITH A MEAL. 180 tablet 1  . Multiple Vitamins-Minerals (CENTRUM SILVER PO) Take 1 tablet by mouth daily.      . Multiple Vitamins-Minerals (ICAPS PO) Take by mouth. Take 1 tablet by mouth every morning    . omeprazole (PRILOSEC) 40 MG capsule Take 1 capsule (40 mg total) by mouth daily. 30 capsule 11  . simvastatin (ZOCOR) 20 MG tablet Take 1 tablet (20 mg total) by mouth at bedtime. 90 tablet 3  . timolol (BETIMOL) 0.25 % ophthalmic solution Place 1 drop into both eyes at bedtime.     No facility-administered medications prior to visit.      Review of Systems  Constitutional:   No  weight loss, night sweats,  Fevers, chills, fatigue, or  lassitude.  HEENT:   No headaches,  Difficulty swallowing,  Tooth/dental problems, or  Sore throat,                No sneezing,  itching, ear ache, nasal congestion, post nasal drip,   CV:  No chest pain,  Orthopnea, PND, swelling in lower extremities, anasarca, dizziness, palpitations, syncope.   GI  No heartburn, indigestion, abdominal pain, nausea, vomiting, diarrhea, change in bowel habits, loss of appetite, bloody stools.   Resp: No shortness of breath with exertion or at rest.  No excess mucus, no productive cough,  No non-productive cough,  No coughing up of blood.  No change in color of mucus.  No wheezing.  No chest wall deformity  Skin: no rash or lesions.  GU: no dysuria, change in color of urine, no urgency or frequency.  No flank pain, no hematuria   MS:  No joint pain or swelling.  No decreased range of motion.  No back pain.    Physical Exam  BP 122/60 (BP Location: Left Arm, Patient Position: Sitting, Cuff Size: Normal)   Pulse 60   Ht 4' 11.45" (1.51 m)  Wt 138 lb (62.6 kg)   SpO2 100%   BMI 27.45 kg/m   GEN: A/Ox3; pleasant , NAD, well nourished , elderly    HEENT:  [place]/AT,  EACs-clear, TMs-wnl, NOSE-clear, THROAT-clear, no lesions, no postnasal drip or exudate noted.   NECK:  Supple w/ fair ROM; no JVD; normal carotid impulses w/o bruits; no thyromegaly or nodules palpated; no lymphadenopathy.    RESP  Clear  P & A; w/o, wheezes/ rales/ or rhonchi. no accessory muscle use, no dullness to percussion  CARD:  RRR, no m/r/g, no peripheral edema, pulses intact, no cyanosis or clubbing.  GI:   Soft & nt; nml bowel sounds; no organomegaly or masses detected.   Musco: Warm bil, no deformities or joint swelling noted.   Neuro: alert, no focal deficits noted.    Skin: Warm, no lesions or rashes    Lab Results:  CBC    Component Value Date/Time   WBC 6.7 09/19/2017 1032   RBC 3.54 (L) 09/19/2017 1032   HGB 10.9 (L) 09/19/2017 1032   HCT 31.5 (L) 09/19/2017 1032   PLT 178.0 09/19/2017 1032   MCV 88.9 09/19/2017 1032   MCHC 34.7 09/19/2017 1032   RDW 13.8 09/19/2017 1032    LYMPHSABS 2.1 09/19/2017 1032   MONOABS 0.6 09/19/2017 1032   EOSABS 0.3 09/19/2017 1032   BASOSABS 0.0 09/19/2017 1032    BMET    Component Value Date/Time   NA 137 12/18/2017 1016   K 4.9 12/18/2017 1016   CL 101 12/18/2017 1016   CO2 29 12/18/2017 1016   GLUCOSE 154 (H) 12/18/2017 1016   GLUCOSE 126 (H) 04/23/2006 1043   BUN 39 (H) 12/18/2017 1016   CREATININE 1.49 (H) 12/18/2017 1016   CALCIUM 9.8 12/18/2017 1016   GFRNONAA 117.17 04/13/2010 1042   GFRAA  07/10/2008 1028    >60        The eGFR has been calculated using the MDRD equation. This calculation has not been validated in all clinical situations. eGFR's persistently <60 mL/min signify possible Chronic Kidney Disease.    BNP No results found for: BNP  ProBNP No results found for: PROBNP  Imaging: No results found.   Assessment & Plan:   Essential hypertension Improved control with addition of Norvasc  Patient's medications were reviewed today and patient education was given. Computerized medication calendar was adjusted/completed   Plan  Patient Instructions  Low fat cholesterol diet. Low salt and sweet diet .  Follow medication calendar closely and bring to each visit.  Follow up Dr. Melvyn Novas  In 3- months and As needed   Please contact office for sooner follow up if symptoms do not improve or worsen or seek emergency care       Diabetes mellitus type 2 in obese Adequate control   Plan  Patient Instructions  Low fat cholesterol diet. Low salt and sweet diet .  Follow medication calendar closely and bring to each visit.  Follow up Dr. Melvyn Novas  In 3- months and As needed   Please contact office for sooner follow up if symptoms do not improve or worsen or seek emergency care          Rexene Edison, NP 01/29/2018

## 2018-01-29 NOTE — Assessment & Plan Note (Signed)
Adequate control   Plan  Patient Instructions  Low fat cholesterol diet. Low salt and sweet diet .  Follow medication calendar closely and bring to each visit.  Follow up Dr. Melvyn Novas  In 3- months and As needed   Please contact office for sooner follow up if symptoms do not improve or worsen or seek emergency care

## 2018-01-29 NOTE — Assessment & Plan Note (Signed)
Improved control with addition of Norvasc  Patient's medications were reviewed today and patient education was given. Computerized medication calendar was adjusted/completed   Plan  Patient Instructions  Low fat cholesterol diet. Low salt and sweet diet .  Follow medication calendar closely and bring to each visit.  Follow up Dr. Melvyn Novas  In 3- months and As needed   Please contact office for sooner follow up if symptoms do not improve or worsen or seek emergency care

## 2018-02-03 NOTE — Progress Notes (Signed)
Chart and office note reviewed in detail  > agree with a/p as outlined    

## 2018-02-07 DIAGNOSIS — H353221 Exudative age-related macular degeneration, left eye, with active choroidal neovascularization: Secondary | ICD-10-CM | POA: Diagnosis not present

## 2018-02-07 DIAGNOSIS — Z961 Presence of intraocular lens: Secondary | ICD-10-CM | POA: Diagnosis not present

## 2018-02-07 DIAGNOSIS — H26492 Other secondary cataract, left eye: Secondary | ICD-10-CM | POA: Diagnosis not present

## 2018-02-07 DIAGNOSIS — H401131 Primary open-angle glaucoma, bilateral, mild stage: Secondary | ICD-10-CM | POA: Diagnosis not present

## 2018-02-19 DIAGNOSIS — H353221 Exudative age-related macular degeneration, left eye, with active choroidal neovascularization: Secondary | ICD-10-CM | POA: Diagnosis not present

## 2018-04-02 DIAGNOSIS — H348312 Tributary (branch) retinal vein occlusion, right eye, stable: Secondary | ICD-10-CM | POA: Diagnosis not present

## 2018-04-02 DIAGNOSIS — E113293 Type 2 diabetes mellitus with mild nonproliferative diabetic retinopathy without macular edema, bilateral: Secondary | ICD-10-CM | POA: Diagnosis not present

## 2018-04-02 DIAGNOSIS — H353232 Exudative age-related macular degeneration, bilateral, with inactive choroidal neovascularization: Secondary | ICD-10-CM | POA: Diagnosis not present

## 2018-04-30 ENCOUNTER — Encounter: Payer: Self-pay | Admitting: Internal Medicine

## 2018-04-30 ENCOUNTER — Ambulatory Visit (INDEPENDENT_AMBULATORY_CARE_PROVIDER_SITE_OTHER): Payer: Medicare Other | Admitting: Internal Medicine

## 2018-04-30 VITALS — BP 144/66 | HR 60 | Ht 59.5 in | Wt 139.0 lb

## 2018-04-30 DIAGNOSIS — E039 Hypothyroidism, unspecified: Secondary | ICD-10-CM

## 2018-04-30 DIAGNOSIS — E1169 Type 2 diabetes mellitus with other specified complication: Secondary | ICD-10-CM | POA: Diagnosis not present

## 2018-04-30 DIAGNOSIS — J4520 Mild intermittent asthma, uncomplicated: Secondary | ICD-10-CM

## 2018-04-30 DIAGNOSIS — E669 Obesity, unspecified: Secondary | ICD-10-CM

## 2018-04-30 DIAGNOSIS — I10 Essential (primary) hypertension: Secondary | ICD-10-CM

## 2018-04-30 DIAGNOSIS — E785 Hyperlipidemia, unspecified: Secondary | ICD-10-CM | POA: Diagnosis not present

## 2018-04-30 LAB — TSH: TSH: 3.93 u[IU]/mL (ref 0.35–4.50)

## 2018-04-30 LAB — BASIC METABOLIC PANEL
BUN: 41 mg/dL — AB (ref 6–23)
CO2: 26 mEq/L (ref 19–32)
Calcium: 9.6 mg/dL (ref 8.4–10.5)
Chloride: 101 mEq/L (ref 96–112)
Creatinine, Ser: 1.41 mg/dL — ABNORMAL HIGH (ref 0.40–1.20)
GFR: 37.17 mL/min — ABNORMAL LOW (ref >60.00)
Glucose, Bld: 189 mg/dL — ABNORMAL HIGH (ref 70–99)
POTASSIUM: 4.7 meq/L (ref 3.5–5.1)
Sodium: 136 mEq/L (ref 135–145)

## 2018-04-30 LAB — CBC WITH DIFFERENTIAL/PLATELET
Basophils Absolute: 0 10*3/uL (ref 0.0–0.1)
Basophils Relative: 0.4 % (ref 0.0–3.0)
Eosinophils Absolute: 0.3 10*3/uL (ref 0.0–0.7)
Eosinophils Relative: 3.9 % (ref 0.0–5.0)
HCT: 31.7 % — ABNORMAL LOW (ref 36.0–46.0)
Hemoglobin: 10.8 g/dL — ABNORMAL LOW (ref 12.0–15.0)
Lymphocytes Relative: 23.2 % (ref 12.0–46.0)
Lymphs Abs: 1.5 10*3/uL (ref 0.7–4.0)
MCHC: 34 g/dL (ref 30.0–36.0)
MCV: 89.4 fl (ref 78.0–100.0)
MONO ABS: 0.5 10*3/uL (ref 0.1–1.0)
Monocytes Relative: 7.9 % (ref 3.0–12.0)
Neutro Abs: 4.3 10*3/uL (ref 1.4–7.7)
Neutrophils Relative %: 64.6 % (ref 43.0–77.0)
Platelets: 200 10*3/uL (ref 150.0–400.0)
RBC: 3.54 Mil/uL — ABNORMAL LOW (ref 3.87–5.11)
RDW: 13.3 % (ref 11.5–15.5)
WBC: 6.7 10*3/uL (ref 4.0–10.5)

## 2018-04-30 LAB — HEPATIC FUNCTION PANEL
ALT: 9 U/L (ref 0–35)
AST: 10 U/L (ref 0–37)
Albumin: 4.1 g/dL (ref 3.5–5.2)
Alkaline Phosphatase: 37 U/L — ABNORMAL LOW (ref 39–117)
Bilirubin, Direct: 0 mg/dL (ref 0.0–0.3)
TOTAL PROTEIN: 7 g/dL (ref 6.0–8.3)
Total Bilirubin: 0.4 mg/dL (ref 0.2–1.2)

## 2018-04-30 LAB — LIPID PANEL
Cholesterol: 254 mg/dL — ABNORMAL HIGH (ref 0–200)
HDL: 43.3 mg/dL (ref >39.00)
NonHDL: 211.13
Total CHOL/HDL Ratio: 6
Triglycerides: 347 mg/dL — ABNORMAL HIGH (ref 0.0–149.0)
VLDL: 69.4 mg/dL — ABNORMAL HIGH (ref 0.0–40.0)

## 2018-04-30 LAB — HEMOGLOBIN A1C: Hgb A1c MFr Bld: 6.8 % — ABNORMAL HIGH (ref 4.6–6.5)

## 2018-04-30 LAB — LDL CHOLESTEROL, DIRECT: Direct LDL: 122 mg/dL

## 2018-04-30 NOTE — Progress Notes (Signed)
Spoke with pt and notified of results per Dr. [name]. Pt verbalized understanding and denied any questions. 

## 2018-04-30 NOTE — Progress Notes (Signed)
Subjective:     Patient ID: Mikayla [name], female   DOB: 1926-02-05     MRN: [id number]  Brief patient profile:  89yowf never smoker with moderate obesity> adult onset diabetes dx 2002 and hyperlipidemia and hbp/ asthma       History of Present Illness  12/18/2017  f/u ov/Ry Moody re:  HBP/ hyperlipidemia/ / dm  Chief Complaint  Patient presents with  . Follow-up   Dyspnea:  Not limited by breathing from desired activities  But more sluggish on bid bisoprolol Cough: none  Sleeping: fine flat Not checking bp or CBG's between ov's and not interested at all in doing so > no overt symptoms of high or low sugars  rec You need your drug formulary from insurance when you return  Change bisorpolol to 5 mg one daily  Add amlopdine 5 mg one daily     04/30/2018  f/u ov/Kelleen Stolze re:  HBP/ DM/ hyperlipidemia / hypothyroid Chief Complaint  Patient presents with  . Follow-up    Doing well and no new co's.  Dyspnea:  None / active with housework, no ex cp claudication/ tia  Cough: none Sleeping: insomnia SABA use: none/ no symb No overt symptoms of hyper or hypoglycemia.   No obvious day to day or daytime variability or assoc excess/ purulent sputum or mucus plugs or hemoptysis or cp or chest tightness, subjective wheeze or overt sinus or hb symptoms.   Sleeping  without nocturnal  or early am exacerbation  of respiratory  c/o's or need for noct saba. Also denies any obvious fluctuation of symptoms with weather or environmental changes or other aggravating or alleviating factors except as outlined above   No unusual exposure hx or h/o childhood pna/ asthma or knowledge of premature birth.  Current Allergies, Complete Past Medical History, Past Surgical History, Family History, and Social History were reviewed in [employer] record.  ROS  The following are not active complaints unless bolded Hoarseness, sore throat, dysphagia, dental problems, itching, sneezing,   nasal congestion or discharge of excess mucus or purulent secretions, ear ache,   fever, chills, sweats, unintended wt loss or wt gain, classically pleuritic or exertional cp,  orthopnea pnd or arm/hand swelling  or leg swelling, presyncope, palpitations, abdominal pain, anorexia, nausea, vomiting, diarrhea  or change in bowel habits or change in bladder habits, change in stools or change in urine, dysuria, hematuria,  rash, arthralgias, visual complaints, headache, numbness, weakness or ataxia or problems with walking or coordination,  change in mood= grief rx both children with ca or  memory.        Current Meds  Medication Sig  . acetaminophen (TYLENOL) 325 MG tablet Take per bottle as needed for pain  . amLODipine (NORVASC) 5 MG tablet Take 1 tablet (5 mg total) by mouth daily.  Marland Kitchen aspirin 81 MG tablet Take 81 mg by mouth daily.   . bisoprolol (ZEBETA) 5 MG tablet Take 1 tablet (5 mg total) by mouth daily.  . bisoprolol (ZEBETA) 5 MG tablet TAKE 1 TABLET BY MOUTH TWICE DAILY  . budesonide-formoterol (SYMBICORT) 80-4.5 MCG/ACT inhaler Inhale 2 puffs into the lungs 2 (two) times daily as needed (shortness of breath/wheezing).  . calcium carbonate (TUMS EX) 750 MG chewable tablet Chew 1 tablet by mouth 3 (three) times daily with meals.   . Cholecalciferol (VITAMIN D) 1000 UNITS capsule Take 1,000 Units by mouth daily.    . cyclobenzaprine (FLEXERIL) 10 MG tablet Take 1 tablet (10 mg total)  by mouth 3 (three) times daily as needed for muscle spasms.  Marland Kitchen dextromethorphan (DELSYM) 30 MG/5ML liquid 2 tsp by mouth every 12 hours as needed for cough  . famotidine (PEPCID) 20 MG tablet Take 20 mg by mouth at bedtime as needed for heartburn (COUGH FLARE).   . furosemide (LASIX) 20 MG tablet Take 1 tablet (20 mg total) by mouth daily.  . Homeopathic Products ([employer] LEG CRAMPS PAIN RELIEF PO) USE AS NEEDED FOR LEGS CRAMPS  . irbesartan (AVAPRO) 150 MG tablet Take 1 tablet (150 mg total) by mouth daily.  Marland Kitchen  levothyroxine (SYNTHROID, LEVOTHROID) 25 MCG tablet TAKE 1 TABLET (25 MCG TOTAL) BY MOUTH DAILY BEFORE BREAKFAST.  Marland Kitchen loratadine (CLARITIN) 10 MG tablet Take 10 mg by mouth daily as needed (DRIPPY NOSE/WATERY EYE/INTIATIVE).   Marland Kitchen metFORMIN (GLUCOPHAGE) 500 MG tablet TAKE 1 TABLET BY MOUTH 2 TIMES A DAY WITH A MEAL.  . Multiple Vitamins-Minerals (CENTRUM SILVER PO) Take 1 tablet by mouth daily.    . Multiple Vitamins-Minerals (ICAPS PO) Take by mouth. Take 1 tablet by mouth every morning  . omeprazole (PRILOSEC) 40 MG capsule Take 1 capsule (40 mg total) by mouth daily.  . simvastatin (ZOCOR) 20 MG tablet Take 1 tablet (20 mg total) by mouth at bedtime.  . timolol (BETIMOL) 0.25 % ophthalmic solution Place 1 drop into both eyes at bedtime.                     Past Medical History:  ESSENTIAL HYPERTENSION, BENIGN (ICD-401.1)  Glaucoma................................................................................[name]/[name]  HYPERLIPIDEMIA (ICD-272.4)  - target LDL less than 70 because of diabetes  -Zocor 20mg  every other day at bedtime April 14, 2010>>> LDL 110 July 06, 2010 and no recurrent  cramps  RHINITIS (ICD-472.0)  Intermittent asthma  - exac by Timolol but tolerates unless uri/flare  OSTEOPENIA (ICD-733.90)  - DEXA 12/28/07 PA Spine .655, L Hip -1.033, Lfem Neck -1.793  - DEXA 02/23/10 1.7 L Fem -1.9 Right Fem -1.8  OBESITY (ICD-278.00)  - Target wt = 148 for BMI < 30  AODM (ICD-250.00)  Health Maintenance.........................................................................Marland KitchenWert  - Td  12/05/16   - Pneumovax 04/2013 /  prevnar 03/03/14 - CPX 03/03/2014  - GYN Health Maint : [name], declines mammography and colonoscopy  --med calendar 06/04/2012 , 08/28/2013 , 04/20/14 , 11/23/2015        Objective:   Physical Exam   amb wf nad   Vital signs reviewed - Note on arrival 02 sats  97% on RA  And   BP 144/66     wt 159 November 05, 2008 >162 April 13, 2010 > 159 July 06, 2010 > 151 10/11/2010 >150 12/01/2010 > 150 01/11/2011 > 04/27/2011  148> 148 07/31/2011 > 10/30/2011  148 > 01/31/2012  147 >143 06/04/2012 > 09/23/2012  149> 01/01/13  142 > 04/03/2013  140> 03/03/2014 141 >145 03/17/2014 >>142 04/20/2014 > 05/11/2014 >141 >141 06/11/14 > 09/22/2014 142 > 12/29/2014 139 > 04/20/2015  144 > 06/11/2015  142 >  08/24/2015  143 > 02/01/2016   144 > 05/02/2016  142 > 03/13/2017  139 >  06/26/2017  144 >   04/30/2018  139    HEENT: nl dentition, turbinates bilaterally, and oropharynx. Nl external ear canals without cough reflex   NECK :  without JVD/Nodes/TM/ nl carotid upstrokes bilaterally   LUNGS: no acc muscle use,  Nl contour chest which is clear to A and P bilaterally without cough on insp or exp maneuvers  CV:  RRR  no s3 or murmur or increase in P2, and no edema   ABD:  soft and nontender with nl inspiratory excursion in the supine position. No bruits or organomegaly appreciated, bowel sounds nl  MS:  Nl gait/ ext warm without deformities, calf tenderness, cyanosis or clubbing No obvious joint restrictions   SKIN: warm and dry without lesions    NEURO:  alert, approp, nl sensorium with  no motor or cerebellar deficits apparent.    Labs ordered/ reviewed:      Chemistry      Component Value Date/Time   NA 136 04/30/2018 1047   K 4.7 04/30/2018 1047   CL 101 04/30/2018 1047   CO2 26 04/30/2018 1047   BUN 41 (H) 04/30/2018 1047   CREATININE 1.41 (H) 04/30/2018 1047      Component Value Date/Time   CALCIUM 9.6 04/30/2018 1047   ALKPHOS 37 (L) 04/30/2018 1047   AST 10 04/30/2018 1047   ALT 9 04/30/2018 1047   BILITOT 0.4 04/30/2018 1047        Lab Results  Component Value Date   WBC 6.7 04/30/2018   HGB 10.8 (L) 04/30/2018   HCT 31.7 (L) 04/30/2018   MCV 89.4 04/30/2018   PLT 200.0 04/30/2018        Lab Results  Component Value Date   TSH 3.93 04/30/2018         Lab Results  Component Value Date   HGB 10.8 (L)  04/30/2018   HGB 10.9 (L) 09/19/2017   HGB 11.5 (L) 02/01/2016          Lab Results  Component Value Date   CREATININE 1.41 (H) 04/30/2018   CREATININE 1.49 (H) 12/18/2017   CREATININE 1.66 (H) 09/19/2017

## 2018-04-30 NOTE — Patient Instructions (Signed)
Please remember to go to the lab department   for your tests - we will call you with the results when they are available.      See calendar for specific medication instructions and bring it back for each and every office visit for every healthcare provider you see.  Without it,  you may not receive the best quality medical care that we feel you deserve.  You will note that the calendar groups together  your maintenance  medications that are timed at particular times of the day.  Think of this as your checklist for what your doctor has instructed you to do until your next evaluation to see what benefit  there is  to staying on a consistent group of medications intended to keep you well.  The other group at the bottom is entirely up to you to use as you see fit  for specific symptoms that may arise between visits that require you to treat them on an as needed basis.  Think of this as your action plan or "what if" list.   Separating the top medications from the bottom group is fundamental to providing you adequate care going forward.     Please schedule a follow up visit in 6  months but call sooner if needed

## 2018-05-01 ENCOUNTER — Encounter: Payer: Self-pay | Admitting: Internal Medicine

## 2018-05-01 NOTE — Assessment & Plan Note (Signed)
-   Target LDL < 100 since has DM but poor statin tolerance    Lab Results  Component Value Date   CHOL 254 (H) 04/30/2018   HDL 43.30 04/30/2018   LDLCALC 125 (H) 03/13/2017   LDLDIRECT 122.0 04/30/2018   TRIG 347.0 (H) 04/30/2018   CHOLHDL 6 04/30/2018   lfts ok Adequate control on present rx, reviewed in detail with pt > no change in rx needed

## 2018-05-01 NOTE — Assessment & Plan Note (Signed)
Lab Results  Component Value Date   HGBA1C 6.8 (H) 04/30/2018   HGBA1C 7.1 (H) 12/18/2017   HGBA1C 6.9 (H) 06/26/2017      Adequate control on present rx, reviewed in detail with pt > no change in rx needed

## 2018-05-01 NOTE — Assessment & Plan Note (Signed)
Creatinine is trending downwards off of all nonsteroidals and with good blood pressure control.  Adequate control on present rx, reviewed in detail with pt > no change in rx needed

## 2018-05-01 NOTE — Assessment & Plan Note (Signed)
This is not an active problem but she does have Symbicort 80 on hand in case she flares.  Based on two studies from NEJM  378; 20 p 1865 (2018) and 380 : p2020-30 (2019) in pts with mild asthma it is reasonable to use low dose symbicort eg 80 2bid "prn" flare in this setting but I emphasized this was only shown with symbicort and takes advantage of the rapid onset of action but is not the same as "rescue therapy" but can be stopped once the acute symptoms have resolved and the need for rescue has been minimized (< 2 x weekly)

## 2018-05-01 NOTE — Assessment & Plan Note (Addendum)
Adequate control on present rx, reviewed in detail with pt > no change in rx needed      I had an extended discussion with the patient reviewing all relevant studies completed to date and  lasting 15 to 20 minutes of a 25 minute comprehensive healthcare office visit    Each maintenance medication was reviewed in detail including most importantly the difference between maintenance and prns and under what circumstances the prns are to be triggered using an action plan format that is not reflected in the computer generated alphabetically organized AVS but trather by a customized med calendar that reflects the AVS meds with confirmed 100% correlation.   In addition, Please see AVS for unique instructions that I personally wrote and verbalized to the the pt in detail and then reviewed with pt  by my nurse highlighting any  changes in therapy recommended at today's visit to their plan of care.

## 2018-05-17 ENCOUNTER — Other Ambulatory Visit: Payer: Self-pay | Admitting: Internal Medicine

## 2018-06-06 DIAGNOSIS — H35363 Drusen (degenerative) of macula, bilateral: Secondary | ICD-10-CM | POA: Diagnosis not present

## 2018-06-06 DIAGNOSIS — E113212 Type 2 diabetes mellitus with mild nonproliferative diabetic retinopathy with macular edema, left eye: Secondary | ICD-10-CM | POA: Diagnosis not present

## 2018-06-06 DIAGNOSIS — E113311 Type 2 diabetes mellitus with moderate nonproliferative diabetic retinopathy with macular edema, right eye: Secondary | ICD-10-CM | POA: Diagnosis not present

## 2018-06-06 DIAGNOSIS — H353211 Exudative age-related macular degeneration, right eye, with active choroidal neovascularization: Secondary | ICD-10-CM | POA: Diagnosis not present

## 2018-06-06 DIAGNOSIS — H35372 Puckering of macula, left eye: Secondary | ICD-10-CM | POA: Diagnosis not present

## 2018-06-06 DIAGNOSIS — H26493 Other secondary cataract, bilateral: Secondary | ICD-10-CM | POA: Diagnosis not present

## 2018-06-06 DIAGNOSIS — H35371 Puckering of macula, right eye: Secondary | ICD-10-CM | POA: Diagnosis not present

## 2018-06-06 DIAGNOSIS — H47011 Ischemic optic neuropathy, right eye: Secondary | ICD-10-CM | POA: Diagnosis not present

## 2018-06-06 DIAGNOSIS — H35453 Secondary pigmentary degeneration, bilateral: Secondary | ICD-10-CM | POA: Diagnosis not present

## 2018-06-24 ENCOUNTER — Other Ambulatory Visit: Payer: Self-pay | Admitting: Internal Medicine

## 2018-07-09 DIAGNOSIS — E113311 Type 2 diabetes mellitus with moderate nonproliferative diabetic retinopathy with macular edema, right eye: Secondary | ICD-10-CM | POA: Diagnosis not present

## 2018-07-26 ENCOUNTER — Telehealth: Payer: Self-pay | Admitting: Internal Medicine

## 2018-07-26 MED ORDER — AZITHROMYCIN 250 MG PO TABS: ORAL_TABLET | ORAL | 0 refills | Status: DC

## 2018-07-26 NOTE — Telephone Encounter (Signed)
LMTCB

## 2018-07-26 NOTE — Telephone Encounter (Signed)
Pt returning call CB# [phone]//[phone]

## 2018-07-26 NOTE — Telephone Encounter (Signed)
Called pt and advised message from the provider. Pt understood and verbalized understanding. Nothing further is needed.    

## 2018-07-26 NOTE — Telephone Encounter (Signed)
RX for zpack sent, if not improvement please call or come in for OV. Instruct she take mucinex twice a day with full glass of water. Monitor for fever or shortness of breath.

## 2018-07-26 NOTE — Telephone Encounter (Signed)
Primary Pulmonologist: Sees Dr Melvyn Novas for primary care Last office visit and with whom: 04/30/18 with [name] What do we see them for (pulmonary problems): mild asthma, rhinitis, htn, dm Last OV assessment/plan:  Assessment & Plan Note by Tanda Rockers, MD at 05/01/2018 9:01 AM  Author: Tanda Rockers, MD Author Type: Physician Filed: 05/01/2018  9:02 AM  Note Status: Written Cosign: Cosign Not Required Encounter Date: 04/30/2018  Problem: Mild intermittent extrinsic asthma without complication  Editor: Tanda Rockers, MD (Physician)    This is not an active problem but she does have Symbicort 80 on hand in case she flares.   Based on two studies from NEJM  378; 20 p 1865 (2018) and 380 : p2020-30 (2019) in pts with mild asthma it is reasonable to use low dose symbicort eg 80 2bid "prn" flare in this setting but I emphasized this was only shown with symbicort and takes advantage of the rapid onset of action but is not the same as "rescue therapy" but can be stopped once the acute symptoms have resolved and the need for rescue has been minimized (< 2 x weekly)       Assessment & Plan Note by Tanda Rockers, MD at 05/01/2018 9:00 AM  Author: Tanda Rockers, MD Author Type: Physician Filed: 05/01/2018  9:01 AM  Note Status: [place] List: Cosign Not Required Encounter Date: 04/30/2018  Problem: Hypothyroidism, acquired  Editor: Tanda Rockers, MD (Physician)  Prior Versions: 1. Tanda Rockers, MD (Physician) at 05/01/2018  9:01 AM - Written    Adequate control on present rx, reviewed in detail with pt > no change in rx needed         I had an extended discussion with the patient reviewing all relevant studies completed to date and  lasting 15 to 20 minutes of a 25 minute comprehensive healthcare office visit     Each maintenance medication was reviewed in detail including most importantly the difference between maintenance and prns and under what circumstances the prns are to be triggered  using an action plan format that is not reflected in the computer generated alphabetically organized AVS but trather by a customized med calendar that reflects the AVS meds with confirmed 100% correlation.    In addition, Please see AVS for unique instructions that I personally wrote and verbalized to the the pt in detail and then reviewed with pt  by my nurse highlighting any  changes in therapy recommended at today's visit to their plan of care.       Assessment & Plan Note by Tanda Rockers, MD at 05/01/2018 8:59 AM  Author: Tanda Rockers, MD Author Type: Physician Filed: 05/01/2018  9:00 AM  Note Status: Written Cosign: Cosign Not Required Encounter Date: 04/30/2018  Problem: Hyperlipidemia associated with type 2 diabetes mellitus  Editor: Tanda Rockers, MD (Physician)      - Target LDL < 100 since has DM but poor statin tolerance           Lab Results  Component Value Date    CHOL 254 (H) 04/30/2018    HDL 43.30 04/30/2018    LDLCALC 125 (H) 03/13/2017    LDLDIRECT 122.0 04/30/2018    TRIG 347.0 (H) 04/30/2018    CHOLHDL 6 04/30/2018    lfts ok Adequate control on present rx, reviewed in detail with pt > no change in rx needed          Assessment & Plan Note  by Tanda Rockers, MD at 05/01/2018 8:59 AM  Author: Tanda Rockers, MD Author Type: Physician Filed: 05/01/2018  8:59 AM  Note Status: Written Cosign: Cosign Not Required Encounter Date: 04/30/2018  Problem: Essential hypertension  Editor: Tanda Rockers, MD (Physician)    Creatinine is trending downwards off of all nonsteroidals and with good blood pressure control.   Adequate control on present rx, reviewed in detail with pt > no change in rx needed       Assessment & Plan Note by Tanda Rockers, MD at 05/01/2018 8:58 AM  Author: Tanda Rockers, MD Author Type: Physician Filed: 05/01/2018  8:58 AM  Note Status: Written Cosign: Cosign Not Required Encounter Date: 04/30/2018  Problem: Diabetes mellitus type  2 in obese  Editor: Tanda Rockers, MD (Physician)         Lab Results  Component Value Date    HGBA1C 6.8 (H) 04/30/2018    HGBA1C 7.1 (H) 12/18/2017    HGBA1C 6.9 (H) 06/26/2017        Adequate control on present rx, reviewed in detail with pt > no change in rx needed       Progress Notes by Rosana Berger, CMA at 04/30/2018 10:15 AM  Author: Rosana Berger, CMA Author Type: Certified Medical Assistant Filed: 04/30/2018  5:17 PM  Note Status: Signed Cosign: Cosign Not Required Encounter Date: 04/30/2018  Editor: Rosana Berger, CMA (Certified Medical Assistant)    Spoke with pt and notified of results per Dr. Melvyn Novas. Pt verbalized understanding and denied any questions.       Patient Instructions by Tanda Rockers, MD at 04/30/2018 10:15 AM  Author: Tanda Rockers, MD Author Type: Physician Filed: 04/30/2018 10:32 AM  Note Status: Signed Cosign: Cosign Not Required Encounter Date: 04/30/2018  Editor: Tanda Rockers, MD (Physician)    Please remember to go to the lab department   for your tests - we will call you with the results when they are available.       See calendar for specific medication instructions and bring it back for each and every office visit for every healthcare provider you see.  Without it,  you may not receive the best quality medical care that we feel you deserve.   You will note that the calendar groups together  your maintenance  medications that are timed at particular times of the day.  Think of this as your checklist for what your doctor has instructed you to do until your next evaluation to see what benefit  there is  to staying on a consistent group of medications intended to keep you well.  The other group at the bottom is entirely up to you to use as you see fit  for specific symptoms that may arise between visits that require you to treat them on an as needed basis.  Think of this as your action plan or "what if" list.    Separating the top  medications from the bottom group is fundamental to providing you adequate care going forward.       Please schedule a follow up visit in 6  months but call sooner if needed        Was appointment offered to patient (explain)? Appt offered and was declined due to no transportation   Reason for call: Pt c/o cough with clear to yellow sputum x 1 wk- sore throat off and on and also runny nose. She denies any  fever, chills, sweats, aches, CP or tightness, wheezing. She denies any recent travel or sick contacts. She states that she had the same symptoms last year around this time and we called in zpack and this did not help until we called in pred taper too. Please advise, thanks!  Allergies  Allergen Reactions  . Amoxicillin Rash  . Lipitor [Atorvastatin]     REACTION: aches

## 2018-07-30 ENCOUNTER — Telehealth: Payer: Self-pay | Admitting: Internal Medicine

## 2018-07-30 NOTE — Telephone Encounter (Signed)
Left message for patient to call back  

## 2018-07-31 MED ORDER — PREDNISONE 10 MG PO TABS: ORAL_TABLET | ORAL | 0 refills | Status: DC

## 2018-07-31 NOTE — Telephone Encounter (Signed)
Called patient, advised her of response below. Patient verbalized understanding. No questions or concerns. Encounter will be closed.

## 2018-07-31 NOTE — Telephone Encounter (Signed)
I'm not APP of the morning and haven't seen this patient before. But advise her to take Claritin/zyrtec daily, mucinex twice daily and ill send in prednisone. Have her get a thermometer to check her temp over the next few days.

## 2018-07-31 NOTE — Telephone Encounter (Signed)
Pt is calling back [phone]

## 2018-07-31 NOTE — Telephone Encounter (Signed)
Called and spoke with patient, she stated that she has still been sick for this past week. Patient was given a zpak on 3/13 and advised to call back in a week if not better. Patient finished last zpak tablet on 07/30/18. Patient stated that she still has a deep productive cough with yellow mucus production. Patient stated that she doesn't think that she has ran a fever but she has had sneezing, a slight headache and slight body aches. Denies chills. Patient has not traveled herself but she was around her daughter on 07/23/18 who traveled down from [place]. Patient did not have the name of the city where in [place] the daughter traveled from. Patient stated that the daughter is not sick currently. Patient would like prednisone called in since this is what she has been given in the past by Dr. Melvyn Novas. Patient stated that she does not think this is the [place].  Beth please advise since MW is out of the office and Zpak was prescribed on 3/13. Thank you!

## 2018-08-05 ENCOUNTER — Ambulatory Visit (INDEPENDENT_AMBULATORY_CARE_PROVIDER_SITE_OTHER): Payer: Medicare Other | Admitting: Adult Health

## 2018-08-05 ENCOUNTER — Encounter: Payer: Self-pay | Admitting: Adult Health

## 2018-08-05 ENCOUNTER — Telehealth: Payer: Self-pay | Admitting: Internal Medicine

## 2018-08-05 DIAGNOSIS — J4531 Mild persistent asthma with (acute) exacerbation: Secondary | ICD-10-CM

## 2018-08-05 DIAGNOSIS — S161XXA Strain of muscle, fascia and tendon at neck level, initial encounter: Secondary | ICD-10-CM

## 2018-08-05 MED ORDER — PREDNISONE 10 MG PO TABS: ORAL_TABLET | ORAL | 0 refills | Status: DC

## 2018-08-05 NOTE — Telephone Encounter (Signed)
Very unlikely this is infection and since our office is empty low risk seeing this pt with all active meds in hand in hand and get sorted out what she needs

## 2018-08-05 NOTE — Progress Notes (Signed)
Chart and office note reviewed in detail  > agree with a/p as outlined    

## 2018-08-05 NOTE — Progress Notes (Signed)
Virtual Visit via Telephone Note  I connected with Mikayla [name] on 08/05/18 at 11:00 AM EDT by telephone and verified that I am speaking with the correct person using two identifiers.   I discussed the limitations, risks, security and privacy concerns of performing an evaluation and management service by telephone and the availability of in person appointments. I also discussed with the patient that there may be a patient responsible charge related to this service. The patient expressed understanding and agreed to proceed.   History of Present Illness: 85 year old female never smoker followed for diabetes, hypertension and hyperlipidemia and asthma. She calls in today with ongoing cough and congestion for last 2 weeks. She was called in [place] intially on 3/13 and prednisone on 3/17 . Feels she needs more prednisone as it helped but was not for very long .  No fever, wheezing , n/v/d. Eating okay . No travel. Has not been out of house for 2 weeks.  Has tried mucinex dm with some help . Does feel that she is getting better but still has nasal congestion and cough . Also has pulled muscle in neck , sore to touch and move around .  Using ice and heat.    Observations/Objective: Talks in full sentences, no audible wheezing .    Assessment and Plan: 1. Asthma Bronchitis -slowly resolving exacerbation Advised her to continue with supportive care including Mucinex DM twice daily as needed for cough and congestion  Claritin 5 mg daily as needed for nasal drainage. Saline nasal rinses as needed Will extend prednisone 20 mg daily for 5 days  2.  Probable neck strain.  Patient is to use ice and heat as needed.  Tylenol as needed for pain. Advised not to use Flexeril if possible as may cause dizziness and balance issues Advised if not improving will need to call back or seek medical attention  Follow Up Instructions: Keep follow up in June and As needed     I discussed the assessment and  treatment plan with the patient. The patient was provided an opportunity to ask questions and all were answered. The patient agreed with the plan and demonstrated an understanding of the instructions.   The patient was advised to call back or seek an in-person evaluation if the symptoms worsen or if the condition fails to improve as anticipated.  I provided 22  minutes of non-face-to-face time during this encounter.   Rexene Edison, NP

## 2018-08-05 NOTE — Telephone Encounter (Signed)
Spoke with the pt and notified of recs per MW  She verbalized understanding  She states not willing to come in  televisit scheduled with TP for 11 am today

## 2018-08-05 NOTE — Telephone Encounter (Signed)
Called and spoke with Patient.  Patient's last OV was 04/30/18, with Dr Melvyn Novas.  Patient stated she has has a productive cough for the last 2 weeks.  Patient stated her phlegm has went from yellow to clear.  Patient stated she was given z pack and prednisone taper, that she stated has helped.  Patient stated she feels that her chest congestion has broken up some.  Patient stated she has head congestion.  Patient has been taking Mucinex, with little relief. Patient request another prednisone taper. Patient stated she has coughed so hard, her neck muscles are sore.  Patient stated she has been taking Tylenol and using ice packs.  Patient stated she has Flexeril, but it causes dizziness.  Patient questioned cutting Flexeril in half. Patient denies travel, temp, or chills.  Patient stated she has not been aroung anyone who is sick.   Patient request prescriptions to be sent to [facility].  Message routed to Dr Melvyn Novas, to advise   Instructions   Please remember to go to the lab department   for your tests - we will call you with the results when they are available.      See calendar for specific medication instructions and bring it back for each and every office visit for every healthcare provider you see.  Without it,  you may not receive the best quality medical care that we feel you deserve.  You will note that the calendar groups together  your maintenance  medications that are timed at particular times of the day.  Think of this as your checklist for what your doctor has instructed you to do until your next evaluation to see what benefit  there is  to staying on a consistent group of medications intended to keep you well.  The other group at the bottom is entirely up to you to use as you see fit  for specific symptoms that may arise between visits that require you to treat them on an as needed basis.  Think of this as your action plan or "what if" list.   Separating the top medications from the  bottom group is fundamental to providing you adequate care going forward.     Please schedule a follow up visit in 6  months but call sooner if needed

## 2018-08-05 NOTE — Patient Instructions (Addendum)
Mucinex DM twice daily as needed for cough and congestion Claritin 5 mg daily as needed for drainage Saline nasal rinses as needed Prednisone 10 mg 2 tablets daily for 5 days take with food Continue on Symbicort 2 puffs twice daily, rinse after use Continue to stay at home, self isolation, social distancing Alternate ice and heat to neck If not improving will need to contact her office sooner Follow-up in June as planned and as needed Please contact office for sooner follow up if symptoms do not improve or worsen or seek emergency care

## 2018-08-06 ENCOUNTER — Telehealth: Payer: Self-pay | Admitting: Internal Medicine

## 2018-08-06 NOTE — Telephone Encounter (Signed)
I will be happy to see her by the weekend if not doing better but no need to change rx from what Tammy already rec

## 2018-08-06 NOTE — Telephone Encounter (Signed)
Pt had a televisit 3/23 and was told if she did not feel better that she would have to coem in. She is calling to go ahead and come in. Please advise CB# [phone]

## 2018-08-06 NOTE — Telephone Encounter (Signed)
Primary Pulmonologist: MW Last office visit and with whom: TP on 08/05/2018 televisit What do we see them for (pulmonary problems): Mild Persistent Asthmatic Bronchitis with Acute exacerbation  Last OV assessment/plan:   2. Melvenia Needles, NP (Nurse Practitioner) at 08/05/2018 11:34 AM - Signed    Mucinex DM twice daily as needed for cough and congestion Claritin 5 mg daily as needed for drainage Saline nasal rinses as needed Prednisone 10 mg 2 tablets daily for 5 days take with food Continue on Symbicort 2 puffs twice daily, rinse after use Continue to stay at home, self isolation, social distancing Alternate ice and heat to neck If not improving will need to contact her office sooner Follow-up in June as planned and as needed Please contact office for sooner follow up if symptoms do not improve or worsen or seek emergency care      Was appointment offered to patient (explain)?  Patient is requesting to come in office for acute ov with MW.  Reason for call: Patient states she is feeling better today, still has productive cough-clear, and some sinus congestion. Pt has no fever, no chills, no body aches, no head aches, no chest tightness nor pain, no wheezing or SOB. Pt would like to know since she only has enough prednisone for 5 days, is office visit needed for additional medication. She was worried of going in the weekend with no prednisone available if still not feeling well.  MW please advise, thank you.

## 2018-08-06 NOTE — Telephone Encounter (Signed)
Called and spoke with patient regarding MW recommendations below Advised and recapped TP recommendations from televisit 08/05/18 Pt verbalized and expressed understanding, no further concerns Nothing further needed

## 2018-08-09 ENCOUNTER — Telehealth: Payer: Self-pay | Admitting: Internal Medicine

## 2018-08-09 NOTE — Telephone Encounter (Signed)
Noted / f/u can be prn until corona restrictions have been lifted.

## 2018-09-26 DIAGNOSIS — H401131 Primary open-angle glaucoma, bilateral, mild stage: Secondary | ICD-10-CM | POA: Diagnosis not present

## 2018-09-26 DIAGNOSIS — Z961 Presence of intraocular lens: Secondary | ICD-10-CM | POA: Diagnosis not present

## 2018-09-26 DIAGNOSIS — E113293 Type 2 diabetes mellitus with mild nonproliferative diabetic retinopathy without macular edema, bilateral: Secondary | ICD-10-CM | POA: Diagnosis not present

## 2018-09-26 DIAGNOSIS — H26493 Other secondary cataract, bilateral: Secondary | ICD-10-CM | POA: Diagnosis not present

## 2018-10-04 ENCOUNTER — Other Ambulatory Visit: Payer: Self-pay | Admitting: Internal Medicine

## 2018-10-10 ENCOUNTER — Other Ambulatory Visit: Payer: Self-pay | Admitting: Internal Medicine

## 2018-10-10 ENCOUNTER — Other Ambulatory Visit: Payer: Self-pay | Admitting: Gastroenterology

## 2018-10-15 DIAGNOSIS — E113311 Type 2 diabetes mellitus with moderate nonproliferative diabetic retinopathy with macular edema, right eye: Secondary | ICD-10-CM | POA: Diagnosis not present

## 2018-10-28 DIAGNOSIS — E113311 Type 2 diabetes mellitus with moderate nonproliferative diabetic retinopathy with macular edema, right eye: Secondary | ICD-10-CM | POA: Diagnosis not present

## 2018-10-29 ENCOUNTER — Ambulatory Visit (INDEPENDENT_AMBULATORY_CARE_PROVIDER_SITE_OTHER): Payer: Medicare Other | Admitting: Internal Medicine

## 2018-10-29 ENCOUNTER — Other Ambulatory Visit: Payer: Self-pay

## 2018-10-29 ENCOUNTER — Encounter: Payer: Self-pay | Admitting: Internal Medicine

## 2018-10-29 DIAGNOSIS — E1169 Type 2 diabetes mellitus with other specified complication: Secondary | ICD-10-CM

## 2018-10-29 DIAGNOSIS — J4520 Mild intermittent asthma, uncomplicated: Secondary | ICD-10-CM

## 2018-10-29 NOTE — Assessment & Plan Note (Addendum)
Reminded regarding immediate rx with symb 2  q12 for flares Based on two studies from NEJM  378; 20 p 1865 (2018) and 380 : p2020-30 (2019) in pts with mild asthma it is reasonable to use low dose symbicort eg 80 2bid "prn" flare in this setting but I emphasized this was only shown with symbicort and takes advantage of the rapid onset of action but is not the same as "rescue therapy" but can be stopped once the acute symptoms have resolved and the need for rescue has been minimized (< 2 x weekly)    >> may also need to consider an alternative to timolol eyedrops during flares    Each maintenance medication was reviewed in detail including most importantly the difference between maintenance and as needed and under what circumstances the prns are to be used.  Please see AVS for specific  Instructions which are unique to this visit and I personally typed out  which were reviewed in detail in writing with the patient and a copy provided.

## 2018-10-29 NOTE — Patient Instructions (Signed)
Patient doe not have my chart  Labs ordered for dm, hb, hyperlipid f/u and she will come in for these fasting at her convenience one morning in the next week or two  Needs follow up office visit in 3 months

## 2018-10-29 NOTE — Assessment & Plan Note (Signed)
Lab Results  Component Value Date   HGBA1C 6.8 (H) 04/30/2018   HGBA1C 7.1 (H) 12/18/2017   HGBA1C 6.9 (H) 06/26/2017      Repeat bmet/ hgbA1C due

## 2018-10-29 NOTE — Progress Notes (Signed)
Subjective:     Patient ID: Mikayla [name], female   DOB: 1925/11/22     MRN: [id number]  Brief patient profile:  49  yowf never smoker with moderate obesity> adult onset diabetes dx 2002 and hyperlipidemia and hbp/ asthma       History of Present Illness  12/18/2017  f/u ov/Khalessi Blough re:  HBP/ hyperlipidemia/ / dm  Chief Complaint  Patient presents with  . Follow-up   Dyspnea:  Not limited by breathing from desired activities  But more sluggish on bid bisoprolol Cough: none  Sleeping: fine flat Not checking bp or CBG's between ov's and not interested at all in doing so > no overt symptoms of high or low sugars  rec You need your drug formulary from insurance when you return  Change bisorpolol to 5 mg one daily  Add amlopdine 5 mg one daily     04/30/2018  f/u ov/Alaura Schippers re:  HBP/ DM/ hyperlipidemia / hypothyroid Chief Complaint  Patient presents with  . Follow-up    Doing well and no new co's.  Dyspnea:  None / active with housework, no ex cp claudication/ tia  Cough: none Sleeping: insomnia SABA use: none/ no symb rec No change rx   NP eval acute  08/05/18 Mucinex DM twice daily as needed for cough and congestion Claritin 5 mg daily as needed for drainage Saline nasal rinses as needed Prednisone 10 mg 2 tablets daily for 5 days take with food Continue on Symbicort 2 puffs twice daily, rinse after use Continue to stay at home, self isolation, social distancing Alternate ice and heat to neck    Virtual Visit via Telephone Note 10/29/2018   I connected with Mikayla [name] on 10/29/18 at  8:30 AM EDT by telephone and verified that I am speaking with the correct person using two identifiers.   I discussed the limitations, risks, security and privacy concerns of performing an evaluation and management service by telephone and the availability of in person appointments. I also discussed with the patient that there may be a patient responsible charge related to this  service. The patient expressed understanding and agreed to proceed.   History of Present Illness: Dyspnea:  Not limited by breathing from desired activities  But very sedentary  Cough: no Sleeping: 2 pillows SABA use: none now  02: none  BP ok per monitor, no symptoms of high or low BS  No obvious day to day or daytime variability or assoc excess/ purulent sputum or mucus plugs or hemoptysis or cp or chest tightness, subjective wheeze or overt sinus or hb symptoms.    Also denies any obvious fluctuation of symptoms with weather or environmental changes or other aggravating or alleviating factors except as outlined above.   Meds reviewed/ med reconciliation completed        Observations/Objective: Min nasal tone to voice, talking in full sentences    Assessment and Plan: See problem list for active a/p's   Follow Up Instructions: See avs for instructions unique to this ov which includes revised/ updated med list     I discussed the assessment and treatment plan with the patient. The patient was provided an opportunity to ask questions and all were answered. The patient agreed with the plan and demonstrated an understanding of the instructions.   The patient was advised to call back or seek an in-person evaluation if the symptoms worsen or if the condition fails to improve as anticipated.  I provided 25 minutes of non-face-to-face  time during this encounter.   [name], MD                   Past Medical History:  ESSENTIAL HYPERTENSION, BENIGN (ICD-401.1)  Glaucoma................................................................................[name]/[name]  HYPERLIPIDEMIA (ICD-272.4)  - target LDL less than 70 because of diabetes  -Zocor 20mg  every other day at bedtime April 14, 2010>>> LDL 110 July 06, 2010 and no recurrent  cramps  RHINITIS (ICD-472.0)  Intermittent asthma  - exac by Timolol but tolerates unless uri/flare  OSTEOPENIA  (ICD-733.90)  - DEXA 12/28/07 PA Spine .655, L Hip -1.033, Lfem Neck -1.793  - DEXA 02/23/10 1.7 L Fem -1.9 Right Fem -1.8  OBESITY (ICD-278.00)  - Target wt = 148 for BMI < 30  AODM (ICD-250.00)  Health Maintenance.........................................................................Marland KitchenWert  - Td  12/05/16   - Pneumovax 04/2013 /  prevnar 03/03/14 - CPX 03/03/2014  - GYN Health Maint : [name], declines mammography and colonoscopy  --med calendar 06/04/2012 , 08/28/2013 , 04/20/14 , 11/23/2015

## 2018-10-29 NOTE — Assessment & Plan Note (Signed)
-   Target LDL < 100 since has DM but poor statin tolerance   Repeat lipid profile, lfts ordered

## 2018-11-12 ENCOUNTER — Other Ambulatory Visit (INDEPENDENT_AMBULATORY_CARE_PROVIDER_SITE_OTHER): Payer: Medicare Other

## 2018-11-12 DIAGNOSIS — E785 Hyperlipidemia, unspecified: Secondary | ICD-10-CM | POA: Diagnosis not present

## 2018-11-12 DIAGNOSIS — J4520 Mild intermittent asthma, uncomplicated: Secondary | ICD-10-CM

## 2018-11-12 DIAGNOSIS — E669 Obesity, unspecified: Secondary | ICD-10-CM

## 2018-11-12 DIAGNOSIS — E1169 Type 2 diabetes mellitus with other specified complication: Secondary | ICD-10-CM | POA: Diagnosis not present

## 2018-11-12 LAB — CBC WITH DIFFERENTIAL/PLATELET
Basophils Absolute: 0 10*3/uL (ref 0.0–0.1)
Basophils Relative: 0.8 % (ref 0.0–3.0)
Eosinophils Absolute: 0.2 10*3/uL (ref 0.0–0.7)
Eosinophils Relative: 4.1 % (ref 0.0–5.0)
HCT: 30.2 % — ABNORMAL LOW (ref 36.0–46.0)
Hemoglobin: 10.2 g/dL — ABNORMAL LOW (ref 12.0–15.0)
Lymphocytes Relative: 30.7 % (ref 12.0–46.0)
Lymphs Abs: 1.8 10*3/uL (ref 0.7–4.0)
MCHC: 33.8 g/dL (ref 30.0–36.0)
MCV: 89.1 fl (ref 78.0–100.0)
Monocytes Absolute: 0.5 10*3/uL (ref 0.1–1.0)
Monocytes Relative: 8.2 % (ref 3.0–12.0)
Neutro Abs: 3.4 10*3/uL (ref 1.4–7.7)
Neutrophils Relative %: 56.2 % (ref 43.0–77.0)
Platelets: 179 10*3/uL (ref 150.0–400.0)
RBC: 3.39 Mil/uL — ABNORMAL LOW (ref 3.87–5.11)
RDW: 13.3 % (ref 11.5–15.5)
WBC: 6 10*3/uL (ref 4.0–10.5)

## 2018-11-12 LAB — HEPATIC FUNCTION PANEL
ALT: 8 U/L (ref 0–35)
AST: 11 U/L (ref 0–37)
Albumin: 4 g/dL (ref 3.5–5.2)
Alkaline Phosphatase: 32 U/L — ABNORMAL LOW (ref 39–117)
Bilirubin, Direct: 0.1 mg/dL (ref 0.0–0.3)
Total Bilirubin: 0.4 mg/dL (ref 0.2–1.2)
Total Protein: 6.7 g/dL (ref 6.0–8.3)

## 2018-11-12 LAB — BASIC METABOLIC PANEL
BUN: 43 mg/dL — ABNORMAL HIGH (ref 6–23)
CO2: 27 mEq/L (ref 19–32)
Calcium: 9.2 mg/dL (ref 8.4–10.5)
Chloride: 101 mEq/L (ref 96–112)
Creatinine, Ser: 1.55 mg/dL — ABNORMAL HIGH (ref 0.40–1.20)
GFR: 31.32 mL/min — ABNORMAL LOW (ref >60.00)
Glucose, Bld: 132 mg/dL — ABNORMAL HIGH (ref 70–99)
Potassium: 5 mEq/L (ref 3.5–5.1)
Sodium: 135 mEq/L (ref 135–145)

## 2018-11-12 LAB — HEMOGLOBIN A1C: Hgb A1c MFr Bld: 6.7 % — ABNORMAL HIGH (ref 4.6–6.5)

## 2018-11-12 LAB — LIPID PANEL
Cholesterol: 264 mg/dL — ABNORMAL HIGH (ref 0–200)
HDL: 41.9 mg/dL (ref >39.00)
NonHDL: 221.69
Total CHOL/HDL Ratio: 6
Triglycerides: 288 mg/dL — ABNORMAL HIGH (ref 0.0–149.0)
VLDL: 57.6 mg/dL — ABNORMAL HIGH (ref 0.0–40.0)

## 2018-11-12 LAB — LDL CHOLESTEROL, DIRECT: Direct LDL: 138 mg/dL

## 2018-11-13 NOTE — Progress Notes (Signed)
Spoke with pt and notified of results per Dr. [name]. Pt verbalized understanding and denied any questions. 

## 2018-12-26 ENCOUNTER — Other Ambulatory Visit: Payer: Self-pay | Admitting: Internal Medicine

## 2019-01-03 ENCOUNTER — Other Ambulatory Visit: Payer: Self-pay | Admitting: Internal Medicine

## 2019-01-07 DIAGNOSIS — H35012 Changes in retinal vascular appearance, left eye: Secondary | ICD-10-CM | POA: Diagnosis not present

## 2019-01-07 DIAGNOSIS — H348322 Tributary (branch) retinal vein occlusion, left eye, stable: Secondary | ICD-10-CM | POA: Diagnosis not present

## 2019-01-07 DIAGNOSIS — E113311 Type 2 diabetes mellitus with moderate nonproliferative diabetic retinopathy with macular edema, right eye: Secondary | ICD-10-CM | POA: Diagnosis not present

## 2019-01-07 DIAGNOSIS — H35373 Puckering of macula, bilateral: Secondary | ICD-10-CM | POA: Diagnosis not present

## 2019-01-07 DIAGNOSIS — H47011 Ischemic optic neuropathy, right eye: Secondary | ICD-10-CM | POA: Diagnosis not present

## 2019-01-07 DIAGNOSIS — H3582 Retinal ischemia: Secondary | ICD-10-CM | POA: Diagnosis not present

## 2019-01-07 DIAGNOSIS — Z961 Presence of intraocular lens: Secondary | ICD-10-CM | POA: Diagnosis not present

## 2019-01-07 DIAGNOSIS — H353131 Nonexudative age-related macular degeneration, bilateral, early dry stage: Secondary | ICD-10-CM | POA: Diagnosis not present

## 2019-01-07 DIAGNOSIS — H35363 Drusen (degenerative) of macula, bilateral: Secondary | ICD-10-CM | POA: Diagnosis not present

## 2019-02-17 ENCOUNTER — Telehealth: Payer: Self-pay | Admitting: Internal Medicine

## 2019-02-18 ENCOUNTER — Other Ambulatory Visit: Payer: Self-pay

## 2019-02-18 ENCOUNTER — Encounter: Payer: Self-pay | Admitting: Internal Medicine

## 2019-02-18 ENCOUNTER — Ambulatory Visit (INDEPENDENT_AMBULATORY_CARE_PROVIDER_SITE_OTHER): Payer: Medicare Other | Admitting: Internal Medicine

## 2019-02-18 DIAGNOSIS — E669 Obesity, unspecified: Secondary | ICD-10-CM

## 2019-02-18 DIAGNOSIS — E1169 Type 2 diabetes mellitus with other specified complication: Secondary | ICD-10-CM | POA: Diagnosis not present

## 2019-02-18 DIAGNOSIS — E039 Hypothyroidism, unspecified: Secondary | ICD-10-CM

## 2019-02-18 DIAGNOSIS — J4520 Mild intermittent asthma, uncomplicated: Secondary | ICD-10-CM

## 2019-02-18 DIAGNOSIS — D649 Anemia, unspecified: Secondary | ICD-10-CM | POA: Diagnosis not present

## 2019-02-18 MED ORDER — BISOPROLOL FUMARATE 5 MG PO TABS: ORAL_TABLET | ORAL | Status: DC

## 2019-02-18 NOTE — Telephone Encounter (Signed)
Spoke with pt. She has been scheduled for her flu shot on 02/25/2019 at 0930. Nothing further is needed.

## 2019-02-18 NOTE — Assessment & Plan Note (Addendum)
  Recheck tsh   Each maintenance medication was reviewed in detail including most importantly the difference between maintenance and as needed and under what circumstances the prns are to be used.  Please see AVS for specific  Instructions which are unique to this visit and I personally typed out  which were reviewed in detail over the phone with the patient and a copy provided via MyChart

## 2019-02-18 NOTE — Assessment & Plan Note (Signed)
Clinically doing fine, recheck bmet now and hgA1C in 3 months

## 2019-02-18 NOTE — Assessment & Plan Note (Signed)
No hx of blood loss or toxic exp/ chemo   Recheck cbc with fe studies/ retic count > hematology eval prn

## 2019-02-18 NOTE — Assessment & Plan Note (Signed)
-   09/22/2014 p extensive coaching HFA effectiveness =    50%   No flares despite occular  timolol maint rx

## 2019-02-18 NOTE — Progress Notes (Signed)
Subjective:     Patient ID: Mikayla [name], female   DOB: [birth year]     MRN: [id number]  Brief patient profile:  58  yowf never smoker with moderate obesity> adult onset diabetes dx 2002 and hyperlipidemia and hbp/ asthma       History of Present Illness  12/18/2017  f/u ov/Mikayla [name] re:  HBP/ hyperlipidemia/ / dm  Chief Complaint  Patient presents with  . Follow-up   Dyspnea:  Not limited by breathing from desired activities  But more sluggish on bid bisoprolol Cough: none  Sleeping: fine flat Not checking bp or CBG's between ov's and not interested at all in doing so > no overt symptoms of high or low sugars  rec You need your drug formulary from insurance when you return  Change bisorpolol to 5 mg one daily  Add amlopdine 5 mg one daily     04/30/2018  f/u ov/Mikayla [name] re:  HBP/ DM/ hyperlipidemia / hypothyroid Chief Complaint  Patient presents with  . Follow-up    Doing well and no new co's.  Dyspnea:  None / active with housework, no ex cp claudication/ tia  Cough: none Sleeping: insomnia SABA use: none/ no symb rec No change rx       Virtual Visit via Telephone Note 10/29/2018  History of Present Illness: Dyspnea:  Not limited by breathing from desired activities  But very sedentary  Cough: no Sleeping: 2 pillows SABA use: none now  02: none  BP ok per monitor, no symptoms of high or low BS rec Check labs: Lab Results  Component Value Date   HGBA1C 6.7 (H) 11/12/2018   HGBA1C 6.8 (H) 04/30/2018   HGBA1C 7.1 (H) 12/18/2017     Lab Results  Component Value Date   CREATININE 1.55 (H) 11/12/2018   CREATININE 1.41 (H) 04/30/2018   CREATININE 1.49 (H) 12/18/2017      Lab Results  Component Value Date   HGB 10.2 (L) 11/12/2018   HGB 10.8 (L) 04/30/2018   HGB 10.9 (L) 09/19/2017       Virtual Visit via Telephone Note 02/18/2019   I connected with Mikayla [name] on 02/18/19 at  9:00 AM EDT by telephone and verified that I am speaking with the  correct person using two identifiers.   I discussed the limitations, risks, security and privacy concerns of performing an evaluation and management service by telephone and the availability of in person appointments. I also discussed with the patient that there may be a patient responsible charge related to this service. The patient expressed understanding and agreed to proceed.   History of Present Illness: Dyspnea:  Able to do work in garden  Cough: no  Sleeping: able to lie flat, 2 pillows  SABA use: none  02: none    No obvious day to day or daytime variability or assoc excess/ purulent sputum or mucus plugs or hemoptysis or cp or chest tightness, subjective wheeze or overt sinus or hb symptoms.    Also denies any obvious fluctuation of symptoms with weather or environmental changes or other aggravating or alleviating factors except as outlined above.   Meds reviewed/ med reconciliation completed                  Past Medical History:  ESSENTIAL HYPERTENSION, BENIGN (ICD-401.1)  Glaucoma................................................................................[name]/[name]  HYPERLIPIDEMIA (ICD-272.4)  - target LDL less than 70 because of diabetes  -Zocor 20mg  every other day at bedtime April 14, 2010>>> LDL 110 July 06, 2010 and  no recurrent  cramps  RHINITIS (ICD-472.0)  Intermittent asthma  - exac by Timolol but tolerates unless uri/flare  OSTEOPENIA (ICD-733.90)  - DEXA 12/28/07 PA Spine .655, L Hip -1.033, Lfem Neck -1.793  - DEXA 02/23/10 1.7 L Fem -1.9 Right Fem -1.8  OBESITY (ICD-278.00)  - Target wt = 148 for BMI < 30  AODM (ICD-250.00)  Health Maintenance.........................................................................Marland KitchenWert  - Td  12/05/16   - Pneumovax 04/2013 /  prevnar 03/03/14 - CPX 03/03/2014  - GYN Health Maint : [name], declines mammography and colonoscopy  --med calendar 06/04/2012 , 08/28/2013 , 04/20/14 , 11/23/2015      Observations/Objective: Sounds great on phone    Assessment and Plan: See problem list for active a/p's   Follow Up Instructions: See avs for instructions unique to this ov which includes revised/ updated med list     I discussed the assessment and treatment plan with the patient. The patient was provided an opportunity to ask questions and all were answered. The patient agreed with the plan and demonstrated an understanding of the instructions.   The patient was advised to call back or seek an in-person evaluation if the symptoms worsen or if the condition fails to improve as anticipated.  I provided 25 minutes of non-face-to-face time during this encounter.   Mikayla Gully, MD

## 2019-02-18 NOTE — Patient Instructions (Addendum)
Come in on Tuesday morning 02/25/2019  for your flu shot and labs ordered today.   No change in medications  Patient has MyChart   Return to clinic  in 3 months on a Tuesday.

## 2019-02-25 ENCOUNTER — Ambulatory Visit (INDEPENDENT_AMBULATORY_CARE_PROVIDER_SITE_OTHER): Payer: Medicare Other

## 2019-02-25 ENCOUNTER — Other Ambulatory Visit: Payer: Self-pay

## 2019-02-25 ENCOUNTER — Telehealth: Payer: Self-pay | Admitting: Internal Medicine

## 2019-02-25 ENCOUNTER — Other Ambulatory Visit: Payer: Self-pay | Admitting: Internal Medicine

## 2019-02-25 DIAGNOSIS — Z23 Encounter for immunization: Secondary | ICD-10-CM

## 2019-02-25 DIAGNOSIS — E669 Obesity, unspecified: Secondary | ICD-10-CM

## 2019-02-25 DIAGNOSIS — D649 Anemia, unspecified: Secondary | ICD-10-CM | POA: Diagnosis not present

## 2019-02-25 DIAGNOSIS — E039 Hypothyroidism, unspecified: Secondary | ICD-10-CM | POA: Diagnosis not present

## 2019-02-25 DIAGNOSIS — E1169 Type 2 diabetes mellitus with other specified complication: Secondary | ICD-10-CM | POA: Diagnosis not present

## 2019-02-25 LAB — TSH: TSH: 6.66 u[IU]/mL — ABNORMAL HIGH (ref 0.35–4.50)

## 2019-02-25 LAB — CBC WITH DIFFERENTIAL/PLATELET
Basophils Absolute: 0 10*3/uL (ref 0.0–0.1)
Basophils Relative: 0.3 % (ref 0.0–3.0)
Eosinophils Absolute: 0.1 10*3/uL (ref 0.0–0.7)
Eosinophils Relative: 2.4 % (ref 0.0–5.0)
HCT: 31 % — ABNORMAL LOW (ref 36.0–46.0)
Hemoglobin: 10.5 g/dL — ABNORMAL LOW (ref 12.0–15.0)
Lymphocytes Relative: 27 % (ref 12.0–46.0)
Lymphs Abs: 1.4 10*3/uL (ref 0.7–4.0)
MCHC: 33.8 g/dL (ref 30.0–36.0)
MCV: 88.4 fl (ref 78.0–100.0)
Monocytes Absolute: 0.5 10*3/uL (ref 0.1–1.0)
Monocytes Relative: 10 % (ref 3.0–12.0)
Neutro Abs: 3.2 10*3/uL (ref 1.4–7.7)
Neutrophils Relative %: 60.3 % (ref 43.0–77.0)
Platelets: 190 10*3/uL (ref 150.0–400.0)
RBC: 3.51 Mil/uL — ABNORMAL LOW (ref 3.87–5.11)
RDW: 13.9 % (ref 11.5–15.5)
WBC: 5.3 10*3/uL (ref 4.0–10.5)

## 2019-02-25 LAB — RETICULOCYTES
ABS Retic: 69000 cells/uL (ref 20000–8000)
Retic Ct Pct: 2 %

## 2019-02-25 LAB — BASIC METABOLIC PANEL
BUN: 28 mg/dL — ABNORMAL HIGH (ref 6–23)
CO2: 28 mEq/L (ref 19–32)
Calcium: 9.1 mg/dL (ref 8.4–10.5)
Chloride: 102 mEq/L (ref 96–112)
Creatinine, Ser: 1.36 mg/dL — ABNORMAL HIGH (ref 0.40–1.20)
GFR: 36.4 mL/min — ABNORMAL LOW (ref >60.00)
Glucose, Bld: 136 mg/dL — ABNORMAL HIGH (ref 70–99)
Potassium: 3.8 mEq/L (ref 3.5–5.1)
Sodium: 138 mEq/L (ref 135–145)

## 2019-02-25 NOTE — Telephone Encounter (Signed)
ATC pt, no answer. Left message for pt to call back.   Notes recorded by Tanda Rockers, MD on 02/25/2019 at 1:24 PM EDT  Call patient : Studies are ok x tsh too high so needs to take synthroid 50 mcg per day and recheck in 3 m

## 2019-02-25 NOTE — Addendum Note (Signed)
Addended by: Suzzanne Cloud E on: 02/25/2019 09:38 AM   Modules accepted: Orders

## 2019-02-25 NOTE — Progress Notes (Signed)
tsh

## 2019-02-26 ENCOUNTER — Other Ambulatory Visit: Payer: Self-pay | Admitting: Internal Medicine

## 2019-02-26 MED ORDER — LEVOTHYROXINE SODIUM 50 MCG PO TABS: 50.0000 ug | ORAL_TABLET | Freq: Every day | ORAL | 5 refills | Status: DC

## 2019-02-26 NOTE — Telephone Encounter (Signed)
Left message for patient to call back  

## 2019-02-26 NOTE — Progress Notes (Signed)
rx for synthroid 50 mg sent

## 2019-02-28 NOTE — Telephone Encounter (Signed)
Spoke with patient. Advised her about her TSH levels, she verbalized understanding. She wanted to know about her glucose and A1c. Advised her of the glucose level but advised that the A1c was not done. She verbalized understanding.   Nothing further needed at time of call.

## 2019-03-31 ENCOUNTER — Other Ambulatory Visit: Payer: Self-pay | Admitting: Internal Medicine

## 2019-04-07 ENCOUNTER — Other Ambulatory Visit: Payer: Self-pay | Admitting: Internal Medicine

## 2019-04-17 DIAGNOSIS — H353221 Exudative age-related macular degeneration, left eye, with active choroidal neovascularization: Secondary | ICD-10-CM | POA: Diagnosis not present

## 2019-04-17 DIAGNOSIS — H401131 Primary open-angle glaucoma, bilateral, mild stage: Secondary | ICD-10-CM | POA: Diagnosis not present

## 2019-04-17 DIAGNOSIS — H26492 Other secondary cataract, left eye: Secondary | ICD-10-CM | POA: Diagnosis not present

## 2019-04-17 DIAGNOSIS — Z961 Presence of intraocular lens: Secondary | ICD-10-CM | POA: Diagnosis not present

## 2019-04-28 DIAGNOSIS — H348322 Tributary (branch) retinal vein occlusion, left eye, stable: Secondary | ICD-10-CM | POA: Diagnosis not present

## 2019-04-28 DIAGNOSIS — H35033 Hypertensive retinopathy, bilateral: Secondary | ICD-10-CM | POA: Diagnosis not present

## 2019-04-28 DIAGNOSIS — E113311 Type 2 diabetes mellitus with moderate nonproliferative diabetic retinopathy with macular edema, right eye: Secondary | ICD-10-CM | POA: Diagnosis not present

## 2019-04-28 DIAGNOSIS — Z961 Presence of intraocular lens: Secondary | ICD-10-CM | POA: Diagnosis not present

## 2019-04-28 DIAGNOSIS — H3581 Retinal edema: Secondary | ICD-10-CM | POA: Diagnosis not present

## 2019-04-28 DIAGNOSIS — H35363 Drusen (degenerative) of macula, bilateral: Secondary | ICD-10-CM | POA: Diagnosis not present

## 2019-05-20 ENCOUNTER — Encounter: Payer: Self-pay | Admitting: Internal Medicine

## 2019-05-20 ENCOUNTER — Other Ambulatory Visit: Payer: Self-pay

## 2019-05-20 ENCOUNTER — Ambulatory Visit (INDEPENDENT_AMBULATORY_CARE_PROVIDER_SITE_OTHER): Payer: Medicare Other | Admitting: Internal Medicine

## 2019-05-20 DIAGNOSIS — E039 Hypothyroidism, unspecified: Secondary | ICD-10-CM

## 2019-05-20 DIAGNOSIS — I10 Essential (primary) hypertension: Secondary | ICD-10-CM | POA: Diagnosis not present

## 2019-05-20 DIAGNOSIS — J4520 Mild intermittent asthma, uncomplicated: Secondary | ICD-10-CM

## 2019-05-20 DIAGNOSIS — N183 Chronic kidney disease, stage 3 unspecified: Secondary | ICD-10-CM

## 2019-05-20 DIAGNOSIS — E1169 Type 2 diabetes mellitus with other specified complication: Secondary | ICD-10-CM | POA: Diagnosis not present

## 2019-05-20 DIAGNOSIS — E669 Obesity, unspecified: Secondary | ICD-10-CM

## 2019-05-20 DIAGNOSIS — E785 Hyperlipidemia, unspecified: Secondary | ICD-10-CM

## 2019-05-20 LAB — CBC WITH DIFFERENTIAL/PLATELET
Basophils Absolute: 0 10*3/uL (ref 0.0–0.1)
Basophils Relative: 0.6 % (ref 0.0–3.0)
Eosinophils Absolute: 0.2 10*3/uL (ref 0.0–0.7)
Eosinophils Relative: 2.7 % (ref 0.0–5.0)
HCT: 33.1 % — ABNORMAL LOW (ref 36.0–46.0)
Hemoglobin: 11 g/dL — ABNORMAL LOW (ref 12.0–15.0)
Lymphocytes Relative: 27.1 % (ref 12.0–46.0)
Lymphs Abs: 1.9 10*3/uL (ref 0.7–4.0)
MCHC: 33.3 g/dL (ref 30.0–36.0)
MCV: 89.4 fl (ref 78.0–100.0)
Monocytes Absolute: 0.6 10*3/uL (ref 0.1–1.0)
Monocytes Relative: 9 % (ref 3.0–12.0)
Neutro Abs: 4.2 10*3/uL (ref 1.4–7.7)
Neutrophils Relative %: 60.6 % (ref 43.0–77.0)
Platelets: 192 10*3/uL (ref 150.0–400.0)
RBC: 3.7 Mil/uL — ABNORMAL LOW (ref 3.87–5.11)
RDW: 13.6 % (ref 11.5–15.5)
WBC: 7 10*3/uL (ref 4.0–10.5)

## 2019-05-20 LAB — HEPATIC FUNCTION PANEL
ALT: 9 U/L (ref 0–35)
AST: 11 U/L (ref 0–37)
Albumin: 4.1 g/dL (ref 3.5–5.2)
Alkaline Phosphatase: 33 U/L — ABNORMAL LOW (ref 39–117)
Bilirubin, Direct: 0.1 mg/dL (ref 0.0–0.3)
Total Bilirubin: 0.4 mg/dL (ref 0.2–1.2)
Total Protein: 6.7 g/dL (ref 6.0–8.3)

## 2019-05-20 LAB — BASIC METABOLIC PANEL
BUN: 56 mg/dL — ABNORMAL HIGH (ref 6–23)
CO2: 25 mEq/L (ref 19–32)
Calcium: 9.6 mg/dL (ref 8.4–10.5)
Chloride: 103 mEq/L (ref 96–112)
Creatinine, Ser: 1.4 mg/dL — ABNORMAL HIGH (ref 0.40–1.20)
GFR: 35.18 mL/min — ABNORMAL LOW (ref >60.00)
Glucose, Bld: 145 mg/dL — ABNORMAL HIGH (ref 70–99)
Potassium: 5.6 mEq/L — ABNORMAL HIGH (ref 3.5–5.1)
Sodium: 136 mEq/L (ref 135–145)

## 2019-05-20 LAB — LIPID PANEL
Cholesterol: 289 mg/dL — ABNORMAL HIGH (ref 0–200)
HDL: 47.1 mg/dL (ref >39.00)
NonHDL: 242.23
Total CHOL/HDL Ratio: 6
Triglycerides: 297 mg/dL — ABNORMAL HIGH (ref 0.0–149.0)
VLDL: 59.4 mg/dL — ABNORMAL HIGH (ref 0.0–40.0)

## 2019-05-20 LAB — HEMOGLOBIN A1C: Hgb A1c MFr Bld: 6.9 % — ABNORMAL HIGH (ref 4.6–6.5)

## 2019-05-20 LAB — LDL CHOLESTEROL, DIRECT: Direct LDL: 135 mg/dL

## 2019-05-20 LAB — TSH: TSH: 1.19 u[IU]/mL (ref 0.35–4.50)

## 2019-05-20 NOTE — Patient Instructions (Addendum)
See calendar for specific medication instructions and bring it back for each and every office visit for every healthcare provider you see.  Without it,  you may not receive the best quality medical care that we feel you deserve.  You will note that the calendar groups together  your maintenance  medications that are timed at particular times of the day.  Think of this as your checklist for what your doctor has instructed you to do until your next evaluation to see what benefit  there is  to staying on a consistent group of medications intended to keep you well.  The other group at the bottom is entirely up to you to use as you see fit  for specific symptoms that may arise between visits that require you to treat them on an as needed basis.  Think of this as your action plan or "what if" list.   Separating the top medications from the bottom group is fundamental to providing you adequate care going forward.    Please remember to go to the lab department   for your tests - we will call you with the results when they are available.      Please schedule a follow up visit in 6  months but call sooner if needed

## 2019-05-20 NOTE — Progress Notes (Signed)
Subjective:     Patient ID: Mikayla [name], female   DOB: Dec 21, 1925     MRN: [id number]  Brief patient profile:  72  yowf never smoker with moderate obesity> adult onset diabetes dx 2002 and hyperlipidemia and hbp/ asthma       History of Present Illness  12/18/2017  f/u ov/Rogelio Winbush re:  HBP/ hyperlipidemia/ / dm  Chief Complaint  Patient presents with  . Follow-up   Dyspnea:  Not limited by breathing from desired activities  But more sluggish on bid bisoprolol Cough: none  Sleeping: fine flat Not checking bp or CBG's between ov's and not interested at all in doing so > no overt symptoms of high or low sugars  rec You need your drug formulary from insurance when you return  Change bisorpolol to 5 mg one daily  Add amlopdine 5 mg one daily     04/30/2018  f/u ov/Pravin Perezperez re:  HBP/ DM/ hyperlipidemia / hypothyroid Chief Complaint  Patient presents with  . Follow-up    Doing well and no new co's.  Dyspnea:  None / active with housework, no ex cp claudication/ tia  Cough: none Sleeping: insomnia SABA use: none/ no symb No overt symptoms of hyper or hypoglycemia rec No change rx   05/20/2019  f/u ov/Bonne Whack re: f/u asthma, dm, hypothryoid Chief Complaint  Patient presents with  . Follow-up    Pt states she feels okay. States she has some SOB on exertion. Pt denies any cough, wheezing, fever, or chills.  Dyspnea:  MMRC1 = can walk nl pace, flat grade, can't hurry or go uphills or steps s sob   Cough: none Sleeping: ok flat s resp symptoms SABA use: none, no symb either  02: none    No obvious day to day or daytime variability or assoc excess/ purulent sputum or mucus plugs or hemoptysis or cp or chest tightness, subjective wheeze or overt sinus or hb symptoms.   Sleeping  without nocturnal  or early am exacerbation  of respiratory  c/o's or need for noct saba. Also denies any obvious fluctuation of symptoms with weather or environmental changes or other aggravating or  alleviating factors except as outlined above   No unusual exposure hx or h/o childhood pna/ asthma or knowledge of premature birth.  Current Allergies, Complete Past Medical History, Past Surgical History, Family History, and Social History were reviewed in [employer] record.  ROS  The following are not active complaints unless bolded Hoarseness, sore throat, dysphagia, dental problems, itching, sneezing,  nasal congestion or discharge of excess mucus or purulent secretions, ear ache,   fever, chills, sweats, unintended wt loss or wt gain, classically pleuritic or exertional cp,  orthopnea pnd or arm/hand swelling  or leg swelling, presyncope, palpitations, abdominal pain, anorexia, nausea, vomiting, diarrhea  or change in bowel habits or change in bladder habits, change in stools or change in urine, dysuria, hematuria,  rash, arthralgias, visual complaints, headache, numbness, weakness or ataxia or problems with walking or coordination,  change in mood or  Memory. Hearing loss bilateral        Current Meds  Medication Sig  . acetaminophen (TYLENOL) 325 MG tablet Take per bottle as needed for pain  . amLODipine (NORVASC) 5 MG tablet TAKE 1 TABLET BY MOUTH DAILY.  Marland Kitchen aspirin 81 MG tablet Take 81 mg by mouth daily.   . bisoprolol (ZEBETA) 5 MG tablet TAKE 1 TABLET BY MOUTH TWICE DAILY (Patient taking differently: daily. TAKE 1  TABLET BY MOUTH ONCE DAILY)  . budesonide-formoterol (SYMBICORT) 80-4.5 MCG/ACT inhaler Inhale 2 puffs into the lungs 2 (two) times daily as needed (shortness of breath/wheezing).  . calcium carbonate (TUMS EX) 750 MG chewable tablet Chew 1 tablet by mouth 3 (three) times daily with meals.   . Cholecalciferol (VITAMIN D) 1000 UNITS capsule Take 1,000 Units by mouth daily.    . cyclobenzaprine (FLEXERIL) 10 MG tablet Take 1 tablet (10 mg total) by mouth 3 (three) times daily as needed for muscle spasms.  Marland Kitchen dextromethorphan (DELSYM) 30 MG/5ML liquid 2 tsp  by mouth every 12 hours as needed for cough  . famotidine (PEPCID) 20 MG tablet Take 20 mg by mouth at bedtime as needed for heartburn (COUGH FLARE).   . furosemide (LASIX) 20 MG tablet TAKE 1 TABLET BY MOUTH DAILY.  Marland Kitchen Homeopathic Products ([employer] LEG CRAMPS PAIN RELIEF PO) USE AS NEEDED FOR LEGS CRAMPS  . irbesartan (AVAPRO) 150 MG tablet TAKE 1 TABLET BY MOUTH DAILY.  Marland Kitchen levothyroxine (SYNTHROID) 50 MCG tablet Take 1 tablet (50 mcg total) by mouth daily before breakfast.  . loratadine (CLARITIN) 10 MG tablet Take 10 mg by mouth daily as needed (DRIPPY NOSE/WATERY EYE/INTIATIVE).   Marland Kitchen metFORMIN (GLUCOPHAGE) 500 MG tablet TAKE 1 TABLET BY MOUTH 2 TIMES A DAY WITH A MEAL.  . Multiple Vitamins-Minerals (CENTRUM SILVER PO) Take 1 tablet by mouth daily.    . Multiple Vitamins-Minerals (ICAPS PO) Take by mouth. Take 1 tablet by mouth every morning  . omeprazole (PRILOSEC) 40 MG capsule TAKE 1 CAPSULE BY MOUTH DAILY.  . simvastatin (ZOCOR) 20 MG tablet Take 1 tablet (20 mg total) by mouth at bedtime.  . timolol (BETIMOL) 0.25 % ophthalmic solution Place 1 drop into both eyes at bedtime.                        Past Medical History:  ESSENTIAL HYPERTENSION, BENIGN (ICD-401.1)  Glaucoma................................................................................[name]/[name]  HYPERLIPIDEMIA (ICD-272.4)  - target LDL less than 70 because of diabetes  -Zocor 20mg  every other day at bedtime April 14, 2010>>> LDL 110 July 06, 2010 and no recurrent  cramps  RHINITIS (ICD-472.0)  Intermittent asthma  - exac by Timolol but tolerates unless uri/flare  OSTEOPENIA (ICD-733.90)  - DEXA 12/28/07 PA Spine .655, L Hip -1.033, Lfem Neck -1.793  - DEXA 02/23/10 1.7 L Fem -1.9 Right Fem -1.8  OBESITY (ICD-278.00)  - Target wt = 148 for BMI < 30  AODM (ICD-250.00)  Health Maintenance.........................................................................Marland KitchenWert  - Td  12/05/16   - Pneumovax  04/2013 /  prevnar 03/03/14 - CPX 03/03/2014  - GYN Health Maint : G[name], declines mammography and colonoscopy  --med calendar 06/04/2012 , 08/28/2013 , 04/20/14 , 11/23/2015        Objective:   Physical Exam    amb wf nad / very HOH  Vital signs reviewed - Note on arrival 02 sats  99% on RA  And BP 134/72       05/20/2019   140  wt 159 November 05, 2008 >162 April 13, 2010 > 159 July 06, 2010 > 151 10/11/2010 >150 12/01/2010 > 150 01/11/2011 > 04/27/2011  148> 148 07/31/2011 > 10/30/2011  148 > 01/31/2012  147 >143 06/04/2012 > 09/23/2012  149> 01/01/13  142 > 04/03/2013  140> 03/03/2014 141 >145 03/17/2014 >>142 04/20/2014 > 05/11/2014 >141 >141 06/11/14 > 09/22/2014 142 > 12/29/2014 139 > 04/20/2015  144 > 06/11/2015  142 >  08/24/2015  143 > 02/01/2016   144 > 05/02/2016  142 > 03/13/2017  139 >  06/26/2017  144 >   04/30/2018  139    HEENT : pt wearing mask not removed for exam due to covid -19 concerns.    NECK :  without JVD/Nodes/TM/ nl carotid upstrokes bilaterally   LUNGS: no acc muscle use,  Nl contour chest which is clear to A and P bilaterally without cough on insp or exp maneuvers   CV:  RRR  no s3 or murmur or increase in P2, and no edema   ABD:  soft and nontender with nl inspiratory excursion in the supine position. No bruits or organomegaly appreciated, bowel sounds nl  MS:  Nl gait/ ext warm without deformities, calf tenderness, cyanosis or clubbing No obvious joint restrictions   SKIN: warm and dry without lesions    NEURO:  alert, approp, nl sensorium with  no motor or cerebellar deficits apparent.    Labs ordered/ reviewed:      Chemistry      Component Value Date/Time   NA 136 05/20/2019 1112   K 5.6 (H) 05/20/2019 1112   CL 103 05/20/2019 1112   CO2 25 05/20/2019 1112   BUN 56 (H) 05/20/2019 1112   CREATININE 1.40 (H) 05/20/2019 1112      Component Value Date/Time   CALCIUM 9.6 05/20/2019 1112   ALKPHOS 33 (L) 05/20/2019 1112   AST 11 05/20/2019 1112    ALT 9 05/20/2019 1112   BILITOT 0.4 05/20/2019 1112        Lab Results  Component Value Date   WBC 7.0 05/20/2019   HGB 11.0 (L) 05/20/2019   HCT 33.1 (L) 05/20/2019   MCV 89.4 05/20/2019   PLT 192.0 05/20/2019         Lab Results  Component Value Date   TSH 1.19 05/20/2019

## 2019-05-21 ENCOUNTER — Encounter: Payer: Self-pay | Admitting: Internal Medicine

## 2019-05-21 DIAGNOSIS — N183 Chronic kidney disease, stage 3 unspecified: Secondary | ICD-10-CM | POA: Insufficient documentation

## 2019-05-21 NOTE — Assessment & Plan Note (Signed)
All goals of chronic asthma control met including optimal function and elimination of symptoms with minimal need for rescue therapy.  Contingencies discussed in full including contacting this office immediately if not controlling the symptoms using the rule of two's.     >>> Based on two studies from NEJM  378; 20 p 1865 (2018) and 380 : p2020-30 (2019) in pts with mild asthma it is reasonable to use low dose symbicort eg 80 2bid "prn" flare in this setting but I emphasized this was only shown with symbicort and takes advantage of the rapid onset of action but is not the same as "rescue therapy" but can be stopped once the acute symptoms have resolved and the need for rescue has been minimized (< 2 x weekly)

## 2019-05-21 NOTE — Assessment & Plan Note (Signed)
Adequate control on present rx, reviewed in detail with pt > no change in rx needed  But creat trending up so rec reduce avapro to 75 mg per day and recheck bmet/ bp in one month

## 2019-05-21 NOTE — Assessment & Plan Note (Signed)
On Avapro 150 mg / day 05/20/2019 > changed to 75 mg and recheck in 4 weeks  Lab Results  Component Value Date   CREATININE 1.40 (H) 05/20/2019   CREATININE 1.36 (H) 02/25/2019   CREATININE 1.55 (H) 11/12/2018     Pt informed of the seriousness of COVID 19 infection as a direct risk to lung health  and safey and to close contacts and should continue to wear a facemask in public and minimize exposure to public locations but especially avoid any area or activity where non-close contacts are not observing distancing or wearing an appropriate face mask.  I strongly recommended vaccine when offered.           Each maintenance medication was reviewed in detail including emphasizing most importantly the difference between maintenance and prns and under what circumstances the prns are to be triggered using an action plan format that is not reflected in the computer generated alphabetically organized AVS which I have not found useful in most complex patients, especially with respiratory illnesses  Total time for H and P, chart review, counseling,  and generating AVS / charting =  30 min

## 2019-05-21 NOTE — Assessment & Plan Note (Signed)
06/26/17 rx  synthroid 25 mcg per day with tsh  7.38  - 05/20/2019  TSH 1.19 on synthroid 50 mcg per day   Adequate control on present rx, reviewed in detail with pt > no change in rx needed

## 2019-05-21 NOTE — Assessment & Plan Note (Signed)
Lab Results  Component Value Date   HGBA1C 6.9 (H) 05/20/2019   HGBA1C 6.7 (H) 11/12/2018   HGBA1C 6.8 (H) 04/30/2018    May need to stop glucophage given creat up, but will try reducing arb first

## 2019-05-21 NOTE — Progress Notes (Signed)
ATC, NA and no option to leave msg 

## 2019-05-21 NOTE — Assessment & Plan Note (Signed)
Lab Results  Component Value Date   CHOL 289 (H) 05/20/2019   HDL 47.10 05/20/2019   LDLCALC 125 (H) 03/13/2017   LDLDIRECT 135.0 05/20/2019   TRIG 297.0 (H) 05/20/2019   CHOLHDL 6 05/20/2019    Not ideal but has not tol higher doses of statins and risk/ benefit of more aggressive rx not clear at age 86  > no change rx for now.

## 2019-05-22 ENCOUNTER — Telehealth: Payer: Self-pay | Admitting: Internal Medicine

## 2019-05-22 DIAGNOSIS — I10 Essential (primary) hypertension: Secondary | ICD-10-CM

## 2019-05-22 NOTE — Telephone Encounter (Signed)
Result Communications  Result Notes  Mikayla [name] [facility]  05/21/2019 3:15 PM EST    ATC, NA and no option to leave Mikayla Colander, MD  05/21/2019 5:06 AM EST    Call patient :  Studies are except her K is a bit high likely due to the avapro so rec avoid excess fruits/veggies for now and reduce avapro by one half - if can't break it in half  give half the dosage in new rx and recheck in one month bmet only     Called spoke with patient and discussed lab results in detail.  Patient will decrease her fruit/veggie intake (reports she does "not eat excess anything") and has a pill cutter to cut her Avapro (Irbesartan) in half.  Patient aware to contact the office if she has difficulty cutting the Avapro (Irbesartan) so that we may send in Rx for half the 150mg  dosage.  Patient will present to the office on 2.9.21 for her repeat bmet and the lab order has already been placed.  Nothing further needed at this time; will sign off.

## 2019-05-25 ENCOUNTER — Emergency Department (HOSPITAL_COMMUNITY): Payer: Medicare Other

## 2019-05-25 ENCOUNTER — Observation Stay (HOSPITAL_COMMUNITY)
Admission: EM | Admit: 2019-05-25 | Discharge: 2019-05-26 | Disposition: A | Discharge status: Home / Self Care | Payer: Medicare Other | Attending: Internal Medicine | Admitting: Internal Medicine

## 2019-05-25 ENCOUNTER — Other Ambulatory Visit: Payer: Self-pay

## 2019-05-25 ENCOUNTER — Encounter (HOSPITAL_COMMUNITY): Payer: Self-pay | Admitting: Emergency Medicine

## 2019-05-25 ENCOUNTER — Observation Stay (HOSPITAL_BASED_OUTPATIENT_CLINIC_OR_DEPARTMENT_OTHER): Payer: Medicare Other

## 2019-05-25 DIAGNOSIS — D649 Anemia, unspecified: Secondary | ICD-10-CM | POA: Diagnosis not present

## 2019-05-25 DIAGNOSIS — I10 Essential (primary) hypertension: Secondary | ICD-10-CM | POA: Diagnosis present

## 2019-05-25 DIAGNOSIS — Z7951 Long term (current) use of inhaled steroids: Secondary | ICD-10-CM | POA: Insufficient documentation

## 2019-05-25 DIAGNOSIS — N179 Acute kidney failure, unspecified: Secondary | ICD-10-CM | POA: Diagnosis not present

## 2019-05-25 DIAGNOSIS — Z7982 Long term (current) use of aspirin: Secondary | ICD-10-CM | POA: Insufficient documentation

## 2019-05-25 DIAGNOSIS — E785 Hyperlipidemia, unspecified: Secondary | ICD-10-CM | POA: Insufficient documentation

## 2019-05-25 DIAGNOSIS — I4430 Unspecified atrioventricular block: Secondary | ICD-10-CM | POA: Diagnosis not present

## 2019-05-25 DIAGNOSIS — E875 Hyperkalemia: Secondary | ICD-10-CM | POA: Diagnosis not present

## 2019-05-25 DIAGNOSIS — R0602 Shortness of breath: Secondary | ICD-10-CM | POA: Diagnosis not present

## 2019-05-25 DIAGNOSIS — I1310 Hypertensive heart and chronic kidney disease without heart failure, with stage 1 through stage 4 chronic kidney disease, or unspecified chronic kidney disease: Secondary | ICD-10-CM | POA: Insufficient documentation

## 2019-05-25 DIAGNOSIS — N183 Chronic kidney disease, stage 3 unspecified: Secondary | ICD-10-CM | POA: Diagnosis present

## 2019-05-25 DIAGNOSIS — E1169 Type 2 diabetes mellitus with other specified complication: Secondary | ICD-10-CM | POA: Diagnosis not present

## 2019-05-25 DIAGNOSIS — R7989 Other specified abnormal findings of blood chemistry: Secondary | ICD-10-CM | POA: Diagnosis not present

## 2019-05-25 DIAGNOSIS — E669 Obesity, unspecified: Secondary | ICD-10-CM | POA: Diagnosis not present

## 2019-05-25 DIAGNOSIS — J4520 Mild intermittent asthma, uncomplicated: Secondary | ICD-10-CM | POA: Diagnosis not present

## 2019-05-25 DIAGNOSIS — R609 Edema, unspecified: Secondary | ICD-10-CM | POA: Diagnosis not present

## 2019-05-25 DIAGNOSIS — I4581 Long QT syndrome: Secondary | ICD-10-CM | POA: Diagnosis not present

## 2019-05-25 DIAGNOSIS — E039 Hypothyroidism, unspecified: Secondary | ICD-10-CM | POA: Diagnosis present

## 2019-05-25 DIAGNOSIS — R0600 Dyspnea, unspecified: Secondary | ICD-10-CM | POA: Diagnosis not present

## 2019-05-25 DIAGNOSIS — R0902 Hypoxemia: Secondary | ICD-10-CM | POA: Diagnosis not present

## 2019-05-25 DIAGNOSIS — I440 Atrioventricular block, first degree: Secondary | ICD-10-CM | POA: Diagnosis not present

## 2019-05-25 DIAGNOSIS — Z20822 Contact with and (suspected) exposure to covid-19: Secondary | ICD-10-CM | POA: Diagnosis not present

## 2019-05-25 DIAGNOSIS — Z7984 Long term (current) use of oral hypoglycemic drugs: Secondary | ICD-10-CM | POA: Insufficient documentation

## 2019-05-25 DIAGNOSIS — E1122 Type 2 diabetes mellitus with diabetic chronic kidney disease: Secondary | ICD-10-CM | POA: Diagnosis not present

## 2019-05-25 DIAGNOSIS — Z79899 Other long term (current) drug therapy: Secondary | ICD-10-CM | POA: Insufficient documentation

## 2019-05-25 DIAGNOSIS — E119 Type 2 diabetes mellitus without complications: Secondary | ICD-10-CM | POA: Diagnosis present

## 2019-05-25 DIAGNOSIS — E7800 Pure hypercholesterolemia, unspecified: Secondary | ICD-10-CM | POA: Diagnosis present

## 2019-05-25 DIAGNOSIS — N1832 Chronic kidney disease, stage 3b: Secondary | ICD-10-CM | POA: Diagnosis present

## 2019-05-25 DIAGNOSIS — R0609 Other forms of dyspnea: Secondary | ICD-10-CM | POA: Diagnosis present

## 2019-05-25 HISTORY — DX: Chronic kidney disease, stage 3 unspecified: N18.30

## 2019-05-25 LAB — BASIC METABOLIC PANEL
Anion gap: 10 (ref 5–15)
Anion gap: 10 (ref 5–15)
BUN: 61 mg/dL — ABNORMAL HIGH (ref 8–23)
BUN: 48 mg/dL — ABNORMAL HIGH (ref 8–23)
CO2: 19 mmol/L — ABNORMAL LOW (ref 22–32)
CO2: 23 mmol/L (ref 22–32)
Calcium: 9.1 mg/dL (ref 8.9–10.3)
Calcium: 9.2 mg/dL (ref 8.9–10.3)
Chloride: 105 mmol/L (ref 98–111)
Chloride: 106 mmol/L (ref 98–111)
Creatinine, Ser: 1.71 mg/dL — ABNORMAL HIGH (ref 0.44–1.00)
Creatinine, Ser: 1.52 mg/dL — ABNORMAL HIGH (ref 0.44–1.00)
GFR calc Af Amer: 30 mL/min — ABNORMAL LOW (ref >60)
GFR calc Af Amer: 34 mL/min — ABNORMAL LOW (ref >60)
GFR calc non Af Amer: 26 mL/min — ABNORMAL LOW (ref >60)
GFR calc non Af Amer: 30 mL/min — ABNORMAL LOW (ref >60)
Glucose, Bld: 189 mg/dL — ABNORMAL HIGH (ref 70–99)
Glucose, Bld: 156 mg/dL — ABNORMAL HIGH (ref 70–99)
Potassium: 6 mmol/L — ABNORMAL HIGH (ref 3.5–5.1)
Potassium: 5.4 mmol/L — ABNORMAL HIGH (ref 3.5–5.1)
Sodium: 134 mmol/L — ABNORMAL LOW (ref 135–145)
Sodium: 139 mmol/L (ref 135–145)

## 2019-05-25 LAB — CBC WITH DIFFERENTIAL/PLATELET
Abs Immature Granulocytes: 0.04 10*3/uL (ref 0.00–0.07)
Basophils Absolute: 0 10*3/uL (ref 0.0–0.1)
Basophils Relative: 0 %
Eosinophils Absolute: 0.1 10*3/uL (ref 0.0–0.5)
Eosinophils Relative: 1 %
HCT: 34.7 % — ABNORMAL LOW (ref 36.0–46.0)
Hemoglobin: 11.3 g/dL — ABNORMAL LOW (ref 12.0–15.0)
Immature Granulocytes: 0 %
Lymphocytes Relative: 12 %
Lymphs Abs: 1.4 10*3/uL (ref 0.7–4.0)
MCH: 29.7 pg (ref 26.0–34.0)
MCHC: 32.6 g/dL (ref 30.0–36.0)
MCV: 91.3 fL (ref 80.0–100.0)
Monocytes Absolute: 0.7 10*3/uL (ref 0.1–1.0)
Monocytes Relative: 6 %
Neutro Abs: 9.3 10*3/uL — ABNORMAL HIGH (ref 1.7–7.7)
Neutrophils Relative %: 81 %
Platelets: 165 10*3/uL (ref 150–400)
RBC: 3.8 MIL/uL — ABNORMAL LOW (ref 3.87–5.11)
RDW: 12.9 % (ref 11.5–15.5)
WBC: 11.6 10*3/uL — ABNORMAL HIGH (ref 4.0–10.5)
nRBC: 0 % (ref 0.0–0.2)

## 2019-05-25 LAB — TROPONIN I (HIGH SENSITIVITY)
Troponin I (High Sensitivity): 6 ng/L (ref <18)
Troponin I (High Sensitivity): 6 ng/L (ref <18)

## 2019-05-25 LAB — HEPATIC FUNCTION PANEL
ALT: 14 U/L (ref 0–44)
AST: 16 U/L (ref 15–41)
Albumin: 3.4 g/dL — ABNORMAL LOW (ref 3.5–5.0)
Alkaline Phosphatase: 30 U/L — ABNORMAL LOW (ref 38–126)
Bilirubin, Direct: <0.1 mg/dL (ref 0.0–0.2)
Total Bilirubin: 0.5 mg/dL (ref 0.3–1.2)
Total Protein: 6.2 g/dL — ABNORMAL LOW (ref 6.5–8.1)

## 2019-05-25 LAB — TSH: TSH: 0.767 u[IU]/mL (ref 0.350–4.500)

## 2019-05-25 LAB — CBG MONITORING, ED
Glucose-Capillary: 139 mg/dL — ABNORMAL HIGH (ref 70–99)
Glucose-Capillary: 139 mg/dL — ABNORMAL HIGH (ref 70–99)
Glucose-Capillary: 128 mg/dL — ABNORMAL HIGH (ref 70–99)

## 2019-05-25 LAB — BRAIN NATRIURETIC PEPTIDE: B Natriuretic Peptide: 117.5 pg/mL — ABNORMAL HIGH (ref 0.0–100.0)

## 2019-05-25 LAB — D-DIMER, QUANTITATIVE: D-Dimer, Quant: 1.15 ug/mL-FEU — ABNORMAL HIGH (ref 0.00–0.50)

## 2019-05-25 LAB — GLUCOSE, CAPILLARY: Glucose-Capillary: 134 mg/dL — ABNORMAL HIGH (ref 70–99)

## 2019-05-25 LAB — SARS CORONAVIRUS 2 (TAT 6-24 HRS): SARS Coronavirus 2: NEGATIVE

## 2019-05-25 LAB — ECHOCARDIOGRAM COMPLETE

## 2019-05-25 MED ORDER — DORZOLAMIDE HCL-TIMOLOL MAL 2-0.5 % OP SOLN
1.0000 [drp] | Freq: Two times a day (BID) | OPHTHALMIC | Status: DC
  Administered 2019-05-25: 1 [drp] via OPHTHALMIC
  Filled 2019-05-25: qty 10

## 2019-05-25 MED ORDER — ADULT MULTIVITAMIN W/MINERALS CH
ORAL_TABLET | Freq: Every day | ORAL | Status: DC
  Administered 2019-05-25: 1 via ORAL
  Administered 2019-05-26: 2 via ORAL
  Filled 2019-05-25 (×2): qty 1

## 2019-05-25 MED ORDER — LEVOTHYROXINE SODIUM 50 MCG PO TABS: 50.0000 ug | ORAL_TABLET | Freq: Every day | ORAL | Status: DC

## 2019-05-25 MED ORDER — SODIUM CHLORIDE 0.9 % IV BOLUS
1000.0000 mL | Freq: Once | INTRAVENOUS | Status: AC
  Administered 2019-05-25: 1000 mL via INTRAVENOUS

## 2019-05-25 MED ORDER — FUROSEMIDE 20 MG PO TABS
20.0000 mg | ORAL_TABLET | Freq: Every day | ORAL | Status: DC
  Administered 2019-05-25 – 2019-05-26 (×2): 20 mg via ORAL
  Filled 2019-05-25 (×2): qty 1

## 2019-05-25 MED ORDER — ACETAMINOPHEN 650 MG RE SUPP: 650.0000 mg | Freq: Four times a day (QID) | RECTAL | Status: DC | PRN

## 2019-05-25 MED ORDER — SODIUM CHLORIDE 0.9 % IV SOLN
1.0000 g | Freq: Once | INTRAVENOUS | Status: AC
  Administered 2019-05-25: 1 g via INTRAVENOUS
  Filled 2019-05-25: qty 10

## 2019-05-25 MED ORDER — BISOPROLOL FUMARATE 5 MG PO TABS
5.0000 mg | ORAL_TABLET | Freq: Every day | ORAL | Status: DC
  Administered 2019-05-25 – 2019-05-26 (×2): 5 mg via ORAL
  Filled 2019-05-25 (×2): qty 1

## 2019-05-25 MED ORDER — FAMOTIDINE 20 MG PO TABS: 20.0000 mg | ORAL_TABLET | Freq: Every evening | ORAL | Status: DC | PRN

## 2019-05-25 MED ORDER — SODIUM BICARBONATE 8.4 % IV SOLN
50.0000 meq | Freq: Once | INTRAVENOUS | Status: AC
  Administered 2019-05-25: 50 meq via INTRAVENOUS
  Filled 2019-05-25: qty 50

## 2019-05-25 MED ORDER — AMLODIPINE BESYLATE 5 MG PO TABS
5.0000 mg | ORAL_TABLET | Freq: Every day | ORAL | Status: DC
  Administered 2019-05-25: 5 mg via ORAL
  Filled 2019-05-25: qty 1

## 2019-05-25 MED ORDER — METFORMIN HCL 500 MG PO TABS
500.0000 mg | ORAL_TABLET | Freq: Every day | ORAL | Status: DC
  Filled 2019-05-25: qty 1

## 2019-05-25 MED ORDER — ACETAMINOPHEN 325 MG PO TABS: 650.0000 mg | ORAL_TABLET | Freq: Four times a day (QID) | ORAL | Status: DC | PRN

## 2019-05-25 MED ORDER — INSULIN ASPART 100 UNIT/ML ~~LOC~~ SOLN: 0.0000 [IU] | Freq: Three times a day (TID) | SUBCUTANEOUS | Status: DC

## 2019-05-25 MED ORDER — MOMETASONE FURO-FORMOTEROL FUM 100-5 MCG/ACT IN AERO
2.0000 | INHALATION_SPRAY | Freq: Two times a day (BID) | RESPIRATORY_TRACT | Status: DC
  Administered 2019-05-26: 2 via RESPIRATORY_TRACT
  Filled 2019-05-25: qty 8.8

## 2019-05-25 MED ORDER — PANTOPRAZOLE SODIUM 40 MG PO TBEC
40.0000 mg | DELAYED_RELEASE_TABLET | Freq: Every day | ORAL | Status: DC
  Administered 2019-05-25 – 2019-05-26 (×2): 40 mg via ORAL
  Filled 2019-05-25 (×2): qty 1

## 2019-05-25 MED ORDER — SODIUM ZIRCONIUM CYCLOSILICATE 10 G PO PACK
10.0000 g | PACK | Freq: Every day | ORAL | Status: DC
  Administered 2019-05-25 – 2019-05-26 (×2): 10 g via ORAL
  Filled 2019-05-25 (×2): qty 1

## 2019-05-25 MED ORDER — SODIUM ZIRCONIUM CYCLOSILICATE 10 G PO PACK
10.0000 g | PACK | Freq: Once | ORAL | Status: AC
  Administered 2019-05-25: 10 g via ORAL
  Filled 2019-05-25: qty 1

## 2019-05-25 MED ORDER — ALBUTEROL SULFATE HFA 108 (90 BASE) MCG/ACT IN AERS
4.0000 | INHALATION_SPRAY | Freq: Once | RESPIRATORY_TRACT | Status: AC
  Administered 2019-05-25: 4 via RESPIRATORY_TRACT
  Filled 2019-05-25: qty 6.7

## 2019-05-25 MED ORDER — PROSIGHT PO TABS
ORAL_TABLET | Freq: Every day | ORAL | Status: DC
  Filled 2019-05-25: qty 1

## 2019-05-25 MED ORDER — ASPIRIN EC 81 MG PO TBEC
81.0000 mg | DELAYED_RELEASE_TABLET | Freq: Every day | ORAL | Status: DC
  Administered 2019-05-25 – 2019-05-26 (×2): 81 mg via ORAL
  Filled 2019-05-25 (×2): qty 1

## 2019-05-25 MED ORDER — SIMVASTATIN 20 MG PO TABS
20.0000 mg | ORAL_TABLET | Freq: Every day | ORAL | Status: DC
  Filled 2019-05-25: qty 1

## 2019-05-25 MED ORDER — INSULIN ASPART 100 UNIT/ML ~~LOC~~ SOLN: 0.0000 [IU] | Freq: Every day | SUBCUTANEOUS | Status: DC

## 2019-05-25 MED ORDER — LORATADINE 10 MG PO TABS: 10.0000 mg | ORAL_TABLET | Freq: Every day | ORAL | Status: DC | PRN

## 2019-05-25 MED ORDER — ENOXAPARIN SODIUM 30 MG/0.3ML ~~LOC~~ SOLN
30.0000 mg | SUBCUTANEOUS | Status: DC
  Administered 2019-05-25: 30 mg via SUBCUTANEOUS
  Filled 2019-05-25: qty 0.3

## 2019-05-25 MED ORDER — METFORMIN HCL 500 MG PO TABS
500.0000 mg | ORAL_TABLET | Freq: Every day | ORAL | Status: DC
  Administered 2019-05-26: 500 mg via ORAL
  Filled 2019-05-25: qty 1

## 2019-05-25 MED ORDER — PERFLUTREN LIPID MICROSPHERE
1.0000 mL | INTRAVENOUS | Status: AC | PRN
  Administered 2019-05-25: 3 mL via INTRAVENOUS
  Filled 2019-05-25: qty 10

## 2019-05-25 MED ORDER — VITAMIN D 25 MCG (1000 UNIT) PO TABS
1000.0000 [IU] | ORAL_TABLET | Freq: Every day | ORAL | Status: DC
  Administered 2019-05-25 – 2019-05-26 (×2): 1000 [IU] via ORAL
  Filled 2019-05-25 (×2): qty 1

## 2019-05-25 NOTE — ED Notes (Signed)
MD at bedside. 

## 2019-05-25 NOTE — H&P (Addendum)
TRH H&P    Patient Demographics:    Mikayla [name], is a 86 y.o. female  MRN: [id number]  DOB - 10-04-1925  Admit Date - 05/25/2019  Referring MD/NP/PA:  Dorise Bullion  Outpatient Primary MD for the patient is Tanda Rockers, MD  Patient coming from: home  Chief complaint- dyspnea   HPI:    Mikayla [name]  is a 86 y.o. female,  w hypertension, hyperlipidemia, Dm2, CKD stage 3, recently hyperkalemic (irbesartan dose lowered), apparently c/o dyspnea just starting today.  Pt denies fever, chills, cough, cp, palp, n/v, abd pain, diarrhea, brbpr, black stool.  Pt denies orthopnea, pnd, weight gain, lower ext edema.  Pt notes that she typically gets bronchitis and wheezing 1 x per year.  This seems similar to that . Pt typically takes an inhaler for this.   Pt denies hx of asthma/ copd, or any work in the past that might cause work related lung disease. No hx of TB.   Wbc 11.6, Hgb 11.3, Plt 165 Na 134, K 6.0, Bun 61, Creatinine 1.71 BNP 117.5 Trop 6 D dimer 1.15  Ekg nsr at 70, nl axis, nl int, poor R progression, no st-t changes c/w ischemia (no significant change from 03/03/2014)  CXR  IMPRESSION: No active cardiopulmonary disease.  Pt will be admitted for dyspnea and hyperkalemia       Review of systems:    In addition to the HPI above,  No Fever-chills, No Headache, No changes with Vision or hearing, No problems swallowing food or Liquids, No Chest pain, Cough  No Abdominal pain, No Nausea or Vomiting, bowel movements are regular, No Blood in stool or Urine, No dysuria, No new skin rashes or bruises, No new joints pains-aches,  No new weakness, tingling, numbness in any extremity, No recent weight gain or loss, No polyuria, polydypsia or polyphagia, No significant Mental Stressors.  All other systems reviewed and are negative.    Past History of the following :    Past  Medical History:  Diagnosis Date  . Chronic rhinitis   . CKD (chronic kidney disease), stage III   . Disorder of bone and cartilage, unspecified   . Essential hypertension, benign   . Obesity, unspecified   . Other and unspecified hyperlipidemia   . Type II or unspecified type diabetes mellitus without mention of complication, not stated as uncontrolled       Past Surgical History:  Procedure Laterality Date  . CESAREAN SECTION N/A   . ESOPHAGOGASTRODUODENOSCOPY N/A 04/29/2014   Procedure: ESOPHAGOGASTRODUODENOSCOPY (EGD);  Surgeon: Milus Banister, MD;  Location: [facility];  Service: Endoscopy;  Laterality: N/A;  . TOTAL ABDOMINAL HYSTERECTOMY        Social History:      Social History   Tobacco Use  . Smoking status: Never Smoker  . Smokeless tobacco: Never Used  Substance Use Topics  . Alcohol use: Yes    Alcohol/week: 0.0 standard drinks    Comment: wine with dinner       Family History :  Family History  Problem Relation Age of Onset  . COPD Mother   . Lung cancer Father   . Prostate cancer Other   . Colon cancer Neg Hx   . Esophageal cancer Neg Hx   . Rectal cancer Neg Hx   . Stomach cancer Neg Hx        Home Medications:   Prior to Admission medications   Medication Sig Start Date End Date Taking? Authorizing Provider  acetaminophen (TYLENOL) 325 MG tablet Take 325 mg by mouth every 6 (six) hours as needed for mild pain or fever.    Yes [provider]  amLODipine (NORVASC) 5 MG tablet TAKE 1 TABLET BY MOUTH DAILY. Patient taking differently: Take 5 mg by mouth daily.  12/26/18  Yes Tanda Rockers, MD  aspirin 81 MG tablet Take 81 mg by mouth daily.    Yes [provider]  bisoprolol (ZEBETA) 5 MG tablet TAKE 1 TABLET BY MOUTH TWICE DAILY Patient taking differently: Take 5 mg by mouth daily.  03/31/19  Yes Tanda Rockers, MD  budesonide-formoterol (SYMBICORT) 80-4.5 MCG/ACT inhaler Inhale 2 puffs into the lungs 2 (two) times  daily as needed (shortness of breath/wheezing). 09/22/14  Yes Tanda Rockers, MD  calcium carbonate (TUMS EX) 750 MG chewable tablet Chew 1 tablet by mouth 3 (three) times daily with meals.    Yes [provider]  Cholecalciferol (VITAMIN D) 1000 UNITS capsule Take 1,000 Units by mouth daily.     Yes [provider]  cyclobenzaprine (FLEXERIL) 10 MG tablet Take 1 tablet (10 mg total) by mouth 3 (three) times daily as needed for muscle spasms. 01/17/18  Yes Tanda Rockers, MD  dextromethorphan (DELSYM) 30 MG/5ML liquid Take 60 mg by mouth every 12 (twelve) hours as needed for cough.    Yes [provider]  dorzolamide-timolol (COSOPT) 22.3-6.8 MG/ML ophthalmic solution Place 1 drop into both eyes 2 (two) times daily.  04/18/19  Yes [provider]  famotidine (PEPCID) 20 MG tablet Take 20 mg by mouth at bedtime as needed for heartburn (COUGH FLARE).    Yes [provider]  furosemide (LASIX) 20 MG tablet TAKE 1 TABLET BY MOUTH DAILY. Patient taking differently: Take 20 mg by mouth daily.  10/04/18  Yes Tanda Rockers, MD  Homeopathic Products ([employer] LEG CRAMPS PAIN RELIEF PO) Take 1 tablet by mouth as needed (for leg cramps).    Yes [provider]  irbesartan (AVAPRO) 150 MG tablet TAKE 1 TABLET BY MOUTH DAILY. Patient taking differently: Take 150 mg by mouth daily.  04/07/19  Yes Tanda Rockers, MD  levothyroxine (SYNTHROID) 50 MCG tablet Take 1 tablet (50 mcg total) by mouth daily before breakfast. 02/26/19  Yes Tanda Rockers, MD  loratadine (CLARITIN) 10 MG tablet Take 10 mg by mouth daily as needed (DRIPPY NOSE/WATERY EYE/INTIATIVE).    Yes [provider]  metFORMIN (GLUCOPHAGE) 500 MG tablet TAKE 1 TABLET BY MOUTH 2 TIMES A DAY WITH A MEAL. Patient taking differently: Take 500 mg by mouth 2 (two) times daily with a meal.  05/17/18  Yes Tanda Rockers, MD  Multiple Vitamins-Minerals (CENTRUM SILVER PO) Take 1 tablet by mouth daily.      Yes [provider]  Multiple Vitamins-Minerals (ICAPS PO) Take 1 tablet by mouth daily.    Yes [provider]  omeprazole (PRILOSEC) 40 MG capsule TAKE 1 CAPSULE BY MOUTH DAILY. Patient taking differently: Take 40 mg by mouth daily.  10/10/18  Yes Milus Banister, MD  simvastatin (ZOCOR) 20 MG tablet Take 1 tablet (20 mg total) by mouth at bedtime. 12/11/16  Yes Parrett, Fonnie Mu, NP     Allergies:     Allergies  Allergen Reactions  . Amoxicillin Rash  . Lipitor [Atorvastatin]     REACTION: aches     Physical Exam:   Vitals  Blood pressure (!) 150/51, pulse 66, temperature 97.8 F (36.6 C), temperature source Oral, resp. rate 17, SpO2 100 %.  1.  General: axoxo3  2. Psychiatric: euthymic  3. Neurologic: nonfocal  4. HEENMT:  Anicteric, pupils 1.13mm symmetric, direct, consensual , near intact Neck: no jvd  5. Respiratory : Slight exp wheezing, no crackles  6. Cardiovascular : rrr s1, s2, no m/g/r  7. Gastrointestinal:  Abd: soft, nt, nd, +bs   8. Skin:  Ext: no c/c/e,  No rash  9.Musculoskeletal:  Good ROM    Data Review:    CBC Recent Labs  Lab 05/20/19 1112 05/25/19 0125  WBC 7.0 11.6*  HGB 11.0* 11.3*  HCT 33.1* 34.7*  PLT 192.0 165  MCV 89.4 91.3  MCH  --  29.7  MCHC 33.3 32.6  RDW 13.6 12.9  LYMPHSABS 1.9 1.4  MONOABS 0.6 0.7  EOSABS 0.2 0.1  BASOSABS 0.0 0.0   ------------------------------------------------------------------------------------------------------------------  Results for orders placed or performed during the hospital encounter of 05/25/19 (from the past 48 hour(s))  CBC with Differential     Status: Abnormal   Collection Time: 05/25/19  1:25 AM  Result Value Ref Range   WBC 11.6 (H) 4.0 - 10.5 K/uL   RBC 3.80 (L) 3.87 - 5.11 MIL/uL   Hemoglobin 11.3 (L) 12.0 - 15.0 g/dL   HCT 34.7 (L) 36.0 - 46.0 %   MCV 91.3 80.0 - 100.0 fL   MCH 29.7 26.0 - 34.0 pg   MCHC 32.6 30.0 - 36.0 g/dL   RDW 12.9 11.5  - 15.5 %   Platelets 165 150 - 400 K/uL   nRBC 0.0 0.0 - 0.2 %   Neutrophils Relative % 81 %   Neutro Abs 9.3 (H) 1.7 - 7.7 K/uL   Lymphocytes Relative 12 %   Lymphs Abs 1.4 0.7 - 4.0 K/uL   Monocytes Relative 6 %   Monocytes Absolute 0.7 0.1 - 1.0 K/uL   Eosinophils Relative 1 %   Eosinophils Absolute 0.1 0.0 - 0.5 K/uL   Basophils Relative 0 %   Basophils Absolute 0.0 0.0 - 0.1 K/uL   Immature Granulocytes 0 %   Abs Immature Granulocytes 0.04 0.00 - 0.07 K/uL    Comment: Performed at [facility], [address], [place], [place] [id number]  Basic metabolic panel     Status: Abnormal   Collection Time: 05/25/19  1:25 AM  Result Value Ref Range   Sodium 134 (L) 135 - 145 mmol/L   Potassium 6.0 (H) 3.5 - 5.1 mmol/L   Chloride 105 98 - 111 mmol/L   CO2 19 (L) 22 - 32 mmol/L   Glucose, Bld 189 (H) 70 - 99 mg/dL   BUN 61 (H) 8 - 23 mg/dL   Creatinine, Ser 1.71 (H) 0.44 - 1.00 mg/dL   Calcium 9.1 8.9 - 10.3 mg/dL   GFR calc non Af Amer 26 (L) >60 mL/min   GFR calc Af Amer 30 (L) >60 mL/min   Anion gap 10 5 - 15    Comment: Performed at [facility], [address] [place], [place] [address]  Brain natriuretic peptide     Status: Abnormal   Collection Time: 05/25/19  2:45 AM  Result Value Ref Range   B Natriuretic Peptide 117.5 (H) 0.0 - 100.0 pg/mL    Comment: Performed at [facility] [address], [place], [place] [address]  Troponin I (High Sensitivity)     Status: None   Collection Time: 05/25/19  2:45 AM  Result Value Ref Range   Troponin I (High Sensitivity) 6 <18 ng/L    Comment: (NOTE) Elevated high sensitivity troponin I (hsTnI) values and significant  changes across serial measurements may suggest ACS but many other  chronic and acute conditions are known to elevate hsTnI results.  Refer to the "Links" section for chest pain algorithms and additional  guidance. Performed at [facility], [place] [address], [place], [place] [address]     D-dimer, quantitative (not at [facility])     Status: Abnormal   Collection Time: 05/25/19  2:45 AM  Result Value Ref Range   D-Dimer, Quant 1.15 (H) 0.00 - 0.50 ug/mL-FEU    Comment: (NOTE) At the manufacturer cut-off of 0.50 ug/mL FEU, this assay has been documented to exclude PE with a sensitivity and negative predictive value of 97 to 99%.  At this time, this assay has not been approved by the FDA to exclude DVT/VTE. Results should be correlated with clinical presentation. Performed at [facility], [place] [address], [place],  [address]     Chemistries  Recent Labs  Lab 05/20/19 1112 05/25/19 0125  NA 136 134*  K 5.6* 6.0*  CL 103 105  CO2 25 19*  GLUCOSE 145* 189*  BUN 56* 61*  CREATININE 1.40* 1.71*  CALCIUM 9.6 9.1  AST 11  --   ALT 9  --   ALKPHOS 33*  --   BILITOT 0.4  --    ------------------------------------------------------------------------------------------------------------------  ------------------------------------------------------------------------------------------------------------------ GFR: Estimated Creatinine Clearance: 17.9 mL/min (A) (by C-G formula based on SCr of 1.71 mg/dL (H)). Liver Function Tests: Recent Labs  Lab 05/20/19 1112  AST 11  ALT 9  ALKPHOS 33*  BILITOT 0.4  PROT 6.7  ALBUMIN 4.1   No results for input(s): LIPASE, AMYLASE in the last 168 hours. No results for input(s): AMMONIA in the last 168 hours. Coagulation Profile: No results for input(s): INR, PROTIME in the last 168 hours. Cardiac Enzymes: No results for input(s): CKTOTAL, CKMB, CKMBINDEX, TROPONINI in the last 168 hours. BNP (last 3 results) No results for input(s): PROBNP in the last 8760 hours. HbA1C: No results for input(s): HGBA1C in the last 72 hours. CBG: No results for input(s): GLUCAP in the last 168 hours. Lipid Profile: No results for input(s): CHOL, HDL, LDLCALC, TRIG, CHOLHDL, LDLDIRECT in the last 72 hours. Thyroid Function  Tests: No results for input(s): TSH, T4TOTAL, FREET4, T3FREE, THYROIDAB in the last 72 hours. Anemia Panel: No results for input(s): VITAMINB12, FOLATE, FERRITIN, TIBC, IRON, RETICCTPCT in the last 72 hours.  --------------------------------------------------------------------------------------------------------------- Urine analysis:    Component Value Date/Time   COLORURINE YELLOW 03/03/2014 [place] 03/03/2014 1040   LABSPEC 1.015 03/03/2014 1040   PHURINE 6.0 03/03/2014 1040   GLUCOSEU NEGATIVE 03/03/2014 1040   HGBUR NEGATIVE 03/03/2014 1040   BILIRUBINUR NEGATIVE 03/03/2014 [place] 03/03/2014 1040   PROTEINUR NEGATIVE 06/24/2008 1118   UROBILINOGEN 0.2 03/03/2014 1040   NITRITE NEGATIVE 03/03/2014 1040   LEUKOCYTESUR MODERATE (A) 03/03/2014 1040  Imaging Results:    DG Chest 2 View  Result Date: 05/25/2019 CLINICAL DATA:  Shortness of breath EXAM: CHEST - 2 VIEW COMPARISON:  March 03, 2014 FINDINGS: The heart size and mediastinal contours are within normal limits. Aortic knob calcifications. Both lungs are clear. No acute osseous abnormality. IMPRESSION: No active cardiopulmonary disease. Electronically Signed   By: Prudencio Pair M.D.   On: 05/25/2019 01:52       Assessment & Plan:    Principal Problem:   Dyspnea Active Problems:   Diabetes mellitus type 2 in obese ([place])   Hyperlipidemia associated with type 2 diabetes mellitus ([place])   Essential hypertension   Hypothyroidism, acquired   Chronic renal disease, stage III   Hyperkalemia    Dyspnea, ? Secondary to mild asthma/ bronchitis , covid-19 ddx Cardiomyopathy, ischemic heart disease, pulmonary hypertension, PE Check trop  Check cardiac echo  PUI for now, if PCR negative then please remove precautions  Elevated D dimer VQ scan ordered r/o Pulmonary embolism as cause of dyspnea  Elevated BNP ? Due to CKD stage3 R/o cardiomyopathy as above  Hyperkalemia  STOP  Irbesartan Sodium bicarbonate 1 amp iv x1 calcium gluconate 1 gm iv x1 [place]  Check bmp at 0700  Hypertension STOP Irbesartan Cont Bisoprolol 5mg  po qday Cont Amlodipine 5mg  po qday Cont Furosemide 20mg  po qday  Hyperlipidemia Cont Simvastatin 20mg  po qhs (not that there is a drug interaction between simvastatin and amlodipine, can't increase further)  Dm2 Stop Metformin due to ARF on CKD stage3 fsbs ac and qhs, ISS  CKD stage3 Check cmp in am  Anemia Check cbc in am, defer further w/up to outpatient, appears chronic  DVT Prophylaxis-   Heparin - SCDs   AM Labs Ordered, also please review Full Orders  Family Communication: Admission, patients condition and plan of care including tests being ordered have been discussed with the patient  who indicate understanding and agree with the plan and Code Status.  Code Status:  FULL CODE per patient  Admission status: Observatiiont: Based on patients clinical presentation and evaluation of above clinical data, I have made determination that patient meets Observation criteria at this time.  Time spent in minutes : 70    Jani Gravel M.D on 05/25/2019 at 5:34 AM

## 2019-05-25 NOTE — ED Notes (Signed)
Echo at bedside

## 2019-05-25 NOTE — ED Triage Notes (Signed)
Patient arrived with EMS from home reports SOB this evening , no cough or fever , denies chest pain . CBG=186/

## 2019-05-25 NOTE — Progress Notes (Signed)
  Echocardiogram 2D Echocardiogram has been performed with [facility].  Mikayla [name] 05/25/2019, 6:02 PM

## 2019-05-25 NOTE — ED Provider Notes (Signed)
Emergency Department Provider Note   I have reviewed the triage vital signs and the nursing notes.   HISTORY  Chief Complaint Shortness of Breath   HPI Mikayla [name] is a 86 y.o. female with medical problems document below who presents the emergency department today with shortness of breath.  Patient states that she was watching TV and she had acute onset of dyspnea and then feeling kind of warm and fatigued.  This lasted about 45 minutes and slowly resolved were all she has now is feeling of having difficulty taking a deep breath.  She states she never had any this before.  She has no fever or cough.  She has no other associated symptoms besides may be some mild chest pressure.  She is recently decreased on her angiotensin receptor blocker secondary to the elevated potassium.  Her 1st dose at a reduced rate was this morning.   No other associated or modifying symptoms.    Past Medical History:  Diagnosis Date   Chronic rhinitis    CKD (chronic kidney disease), stage III    Disorder of bone and cartilage, unspecified    Essential hypertension, benign    Obesity, unspecified    Other and unspecified hyperlipidemia    Type II or unspecified type diabetes mellitus without mention of complication, not stated as uncontrolled     Patient Active Problem List   Diagnosis Date Noted   Dyspnea 05/25/2019   Hyperkalemia 05/25/2019   Chronic renal disease, stage III 05/21/2019   Anemia, normocytic normochromic 12/19/2017   Acute upper respiratory infection 07/25/2016   Hypothyroidism, acquired 05/04/2016   Fatigue 09/22/2014   Essential hypertension 03/03/2014   Back pain 02/04/2012   Mild intermittent extrinsic asthma without complication [id number]   PELVIC MASS 06/25/2008   CYSTITIS, ACUTE 06/09/2008   Diabetes mellitus type 2 in obese ([place]) 06/10/2007   Hyperlipidemia associated with type 2 diabetes mellitus ([place]) 06/10/2007   OBESITY 06/10/2007     RHINITIS 06/10/2007   Osteoporosis/osteopenia increased risk 06/10/2007    Past Surgical History:  Procedure Laterality Date   CESAREAN SECTION N/A    ESOPHAGOGASTRODUODENOSCOPY N/A 04/29/2014   Procedure: ESOPHAGOGASTRODUODENOSCOPY (EGD);  Surgeon: Milus Banister, MD;  Location: [facility];  Service: Endoscopy;  Laterality: N/A;   TOTAL ABDOMINAL HYSTERECTOMY      Current Outpatient Rx   Order #: [id number] Class: Historical Med   Order #: [id number] Class: Normal   Order #: [id number] Class: Historical Med   Order #: [id number] Class: Normal   Order #: [id number] Class: Sample   Order #: [id number] Class: Historical Med   Order #: [id number] Class: Historical Med   Order #: [id number] Class: Phone In   Order #: [id number] Class: Historical Med   Order #: [id number] Class: Historical Med   Order #: [id number] Class: Historical Med   Order #: [id number] Class: Normal   Order #: [id number] Class: Historical Med   Order #: [id number] Class: Normal   Order #: [id number] Class: Normal   Order #: [id number] Class: Historical Med   Order #: [id number] Class: Normal   Order #: [id number] Class: Historical Med   Order #: [id number] Class: Historical Med   Order #: [id number] Class: Normal   Order #: [id number] Class: Normal    Allergies Amoxicillin and Lipitor [atorvastatin]  Family History  Problem Relation Age of Onset   COPD Mother    Lung cancer Father    Prostate cancer Other    Colon cancer Neg Hx    Esophageal cancer Neg Hx    Rectal cancer Neg Hx    Stomach cancer Neg  Hx     Social History Social History   Tobacco Use   Smoking status: Never Smoker   Smokeless tobacco: Never Used  Substance Use Topics   Alcohol use: Yes    Alcohol/week: 0.0 standard drinks    Comment: wine with dinner   Drug use: No    Review of Systems  All other systems negative except as documented in the HPI. All pertinent positives and negatives as reviewed in the  HPI. ____________________________________________   PHYSICAL EXAM:  VITAL SIGNS: ED Triage Vitals  Enc Vitals Group     BP 05/25/19 0130 (!) 171/55     Pulse Rate 05/25/19 0130 70     Resp 05/25/19 0130 18     Temp 05/25/19 0130 97.8 F (36.6 C)     Temp Source 05/25/19 0130 Oral     SpO2 05/25/19 0130 100 %    Constitutional: Alert and oriented. Well appearing and in no acute distress. Eyes: Conjunctivae are normal. PERRL. EOMI. Head: Atraumatic. Nose: No congestion/rhinnorhea. Mouth/Throat: Mucous membranes are moist.  Oropharynx non-erythematous. Neck: No stridor.  No meningeal signs.   Cardiovascular: Normal rate, regular rhythm. Good peripheral circulation. Grossly normal heart sounds.   Respiratory: Normal respiratory effort.  No retractions. Lungs CTAB. Gastrointestinal: Soft and nontender. No distention.  Musculoskeletal: No lower extremity tenderness nor edema. No gross deformities of extremities. Neurologic:  Normal speech and language. No gross focal neurologic deficits are appreciated.  Skin:  Skin is warm, dry and intact. No rash noted.   ____________________________________________   LABS (all labs ordered are listed, but only abnormal results are displayed)  Labs Reviewed  CBC WITH DIFFERENTIAL/PLATELET - Abnormal; Notable for the following components:      Result Value   WBC 11.6 (*)    RBC 3.80 (*)    Hemoglobin 11.3 (*)    HCT 34.7 (*)    Neutro Abs 9.3 (*)    All other components within normal limits  BASIC METABOLIC PANEL - Abnormal; Notable for the following components:   Sodium 134 (*)    Potassium 6.0 (*)    CO2 19 (*)    Glucose, Bld 189 (*)    BUN 61 (*)    Creatinine, Ser 1.71 (*)    GFR calc non Af Amer 26 (*)    GFR calc Af Amer 30 (*)    All other components within normal limits  BRAIN NATRIURETIC PEPTIDE - Abnormal; Notable for the following components:   B Natriuretic Peptide 117.5 (*)    All other components within normal  limits  D-DIMER, QUANTITATIVE (NOT AT [facility]) - Abnormal; Notable for the following components:   D-Dimer, Quant 1.15 (*)    All other components within normal limits  SARS CORONAVIRUS 2 (TAT 6-24 HRS)  HEPATIC FUNCTION PANEL  TROPONIN I (HIGH SENSITIVITY)  TROPONIN I (HIGH SENSITIVITY)   ____________________________________________  EKG   EKG Interpretation  Date/Time:  Sunday May 25 2019 01:27:46 EST Ventricular Rate:  69 PR Interval:  274 QRS Duration: 82 QT Interval:  414 QTC Calculation: 443 R Axis:   20 Text Interpretation: Sinus rhythm with 1st degree A-V block Low voltage QRS Cannot rule out Anterior infarct , age undetermined Abnormal ECG No significant change since last tracing Confirmed by Merrily Pew [phone]) on 05/25/2019 2:01:57 AM       ____________________________________________  RADIOLOGY  DG Chest 2 View  Result Date: 05/25/2019 CLINICAL DATA:  Shortness of breath EXAM: CHEST - 2 VIEW COMPARISON:  March 03, 2014 FINDINGS: The heart size and mediastinal contours are within normal limits. Aortic knob calcifications. Both lungs are clear. No acute osseous abnormality. IMPRESSION: No active cardiopulmonary disease. Electronically Signed   By: Prudencio Pair M.D.   On: 05/25/2019 01:52    ____________________________________________   PROCEDURES  Procedure(s) performed:   .Critical Care Performed by: Merrily Pew, MD Authorized by: Merrily Pew, MD   Critical care provider statement:    Critical care time (minutes):  45   Critical care was necessary to treat or prevent imminent or life-threatening deterioration of the following conditions:  Renal failure   Critical care was time spent personally by me on the following activities:  Discussions with consultants, evaluation of patient's response to treatment, examination of patient, ordering and performing treatments and interventions, ordering and review of laboratory studies, ordering and review of  radiographic studies, pulse oximetry, re-evaluation of patient's condition, obtaining history from patient or surrogate and review of old charts  ____________________________________________   INITIAL IMPRESSION / [place] / ED COURSE  Unclear etiology for her symptoms but she has elevated potassium, worsening kidney function, elevated D-dimer (not able to do CT scan because of low GFR) and a prolonged PR interval on her EKG which could be concerning for possible arrhythmia versus PE as a cause for her symptoms.  Have started some fluids.  Started heparin pending PE rule out.  Discussed with the hospitalist, Dr. Maudie Mercury who will admit. Covid pending.  Pertinent labs & imaging results that were available during my care of the patient were reviewed by me and considered in my medical decision making (see chart for details).  ____________________________________________  FINAL CLINICAL IMPRESSION(S) / ED DIAGNOSES  Final diagnoses:  AKI (acute kidney injury) ([place])  Hyperkalemia  SOB (shortness of breath)  Positive D dimer  Prolonged P-R interval     MEDICATIONS GIVEN DURING THIS VISIT:  Medications  calcium gluconate 1 g in sodium chloride 0.9 % 100 mL IVPB (1 g Intravenous New Bag/Given 05/25/19 0515)  albuterol (VENTOLIN HFA) 108 (90 Base) MCG/ACT inhaler 4 puff (4 puffs Inhalation Given 05/25/19 0307)  sodium zirconium cyclosilicate (LOKELMA) packet 10 g (10 g Oral Given 05/25/19 0437)  sodium chloride 0.9 % bolus 1,000 mL (1,000 mLs Intravenous New Bag/Given 05/25/19 0437)     NEW OUTPATIENT MEDICATIONS STARTED DURING THIS VISIT:  New Prescriptions   No medications on file    Note:  This note was prepared with assistance of Dragon voice recognition software. Occasional wrong-word or sound-a-like substitutions may have occurred due to the inherent limitations of voice recognition software.   Joya Willmott, Corene Cornea, MD 05/25/19 [phone]

## 2019-05-25 NOTE — Progress Notes (Signed)
Bilateral lower extremity venous duplex complete.  Please see CV Proc tab for preliminary results. Lita Mains- RDMS, RVT 4:02 PM  05/25/2019

## 2019-05-25 NOTE — ED Notes (Signed)
Ordered breakfast--Shakeia Krus 

## 2019-05-25 NOTE — ED Notes (Signed)
ED TO INPATIENT HANDOFF REPORT  ED Nurse Name and Phone #: Thurmond Butts Daniels Name/Age/Gender Mikayla [name] 86 y.o. female Room/Bed: 038C/038C  Code Status   Code Status: Full Code  Home/SNF/Other Home Patient oriented to: self, place, time and situation Is this baseline? Yes   Triage Complete: Triage complete  Chief Complaint Dyspnea [R06.00]  Triage Note Patient arrived with EMS from home reports SOB this evening , no cough or fever , denies chest pain . CBG=186/    Allergies Allergies  Allergen Reactions  . Amoxicillin Rash  . Lipitor [Atorvastatin]     REACTION: aches    Level of Care/Admitting Diagnosis ED Disposition    ED Disposition Condition Comment   Admit  Hospital Area: [place] [[id number]]  Level of Care: Telemetry Medical [104]  I expect the patient will be discharged within 24 hours: No (not a candidate for 5C-Observation unit)  Covid Evaluation: Symptomatic Person Under Investigation (PUI)  Diagnosis: Dyspnea [id number]  Admitting Physician: Jani Gravel [[id number]]  Attending Physician: Jani Gravel [[id number]]       B Medical/Surgery History Past Medical History:  Diagnosis Date  . Chronic rhinitis   . CKD (chronic kidney disease), stage III   . Disorder of bone and cartilage, unspecified   . Essential hypertension, benign   . Obesity, unspecified   . Other and unspecified hyperlipidemia   . Type II or unspecified type diabetes mellitus without mention of complication, not stated as uncontrolled    Past Surgical History:  Procedure Laterality Date  . CESAREAN SECTION N/A   . ESOPHAGOGASTRODUODENOSCOPY N/A 04/29/2014   Procedure: ESOPHAGOGASTRODUODENOSCOPY (EGD);  Surgeon: Milus Banister, MD;  Location: [facility];  Service: Endoscopy;  Laterality: N/A;  . TOTAL ABDOMINAL HYSTERECTOMY       A IV Location/Drains/Wounds Patient Lines/Drains/Airways Status   Active Line/Drains/Airways    Name:   Placement date:    Placement time:   Site:   Days:   Peripheral IV 05/25/19 Right Antecubital   05/25/19    0337    Antecubital   less than 1          Intake/Output Last 24 hours  Intake/Output Summary (Last 24 hours) at 05/25/2019 1811 Last data filed at 05/25/2019 1127 Gross per 24 hour  Intake 1100 ml  Output 550 ml  Net 550 ml    Labs/Imaging Results for orders placed or performed during the hospital encounter of 05/25/19 (from the past 48 hour(s))  CBC with Differential     Status: Abnormal   Collection Time: 05/25/19  1:25 AM  Result Value Ref Range   WBC 11.6 (H) 4.0 - 10.5 K/uL   RBC 3.80 (L) 3.87 - 5.11 MIL/uL   Hemoglobin 11.3 (L) 12.0 - 15.0 g/dL   HCT 34.7 (L) 36.0 - 46.0 %   MCV 91.3 80.0 - 100.0 fL   MCH 29.7 26.0 - 34.0 pg   MCHC 32.6 30.0 - 36.0 g/dL   RDW 12.9 11.5 - 15.5 %   Platelets 165 150 - 400 K/uL   nRBC 0.0 0.0 - 0.2 %   Neutrophils Relative % 81 %   Neutro Abs 9.3 (H) 1.7 - 7.7 K/uL   Lymphocytes Relative 12 %   Lymphs Abs 1.4 0.7 - 4.0 K/uL   Monocytes Relative 6 %   Monocytes Absolute 0.7 0.1 - 1.0 K/uL   Eosinophils Relative 1 %   Eosinophils Absolute 0.1 0.0 - 0.5 K/uL   Basophils Relative 0 %  Basophils Absolute 0.0 0.0 - 0.1 K/uL   Immature Granulocytes 0 %   Abs Immature Granulocytes 0.04 0.00 - 0.07 K/uL    Comment: Performed at [facility], [place] [address], [place], [place] [id number]  Basic metabolic panel     Status: Abnormal   Collection Time: 05/25/19  1:25 AM  Result Value Ref Range   Sodium 134 (L) 135 - 145 mmol/L   Potassium 6.0 (H) 3.5 - 5.1 mmol/L   Chloride 105 98 - 111 mmol/L   CO2 19 (L) 22 - 32 mmol/L   Glucose, Bld 189 (H) 70 - 99 mg/dL   BUN 61 (H) 8 - 23 mg/dL   Creatinine, Ser 1.71 (H) 0.44 - 1.00 mg/dL   Calcium 9.1 8.9 - 10.3 mg/dL   GFR calc non Af Amer 26 (L) >60 mL/min   GFR calc Af Amer 30 (L) >60 mL/min   Anion gap 10 5 - 15    Comment: Performed at [facility] [address] [place], [place] [address]   Brain natriuretic peptide     Status: Abnormal   Collection Time: 05/25/19  2:45 AM  Result Value Ref Range   B Natriuretic Peptide 117.5 (H) 0.0 - 100.0 pg/mL    Comment: Performed at [facility] [address] [place], [place] [address]  Troponin I (High Sensitivity)     Status: None   Collection Time: 05/25/19  2:45 AM  Result Value Ref Range   Troponin I (High Sensitivity) 6 <18 ng/L    Comment: (NOTE) Elevated high sensitivity troponin I (hsTnI) values and significant  changes across serial measurements may suggest ACS but many other  chronic and acute conditions are known to elevate hsTnI results.  Refer to the "Links" section for chest pain algorithms and additional  guidance. Performed at [facility], [place] [address] [place], [place] [address]   D-dimer, quantitative (not at [facility])     Status: Abnormal   Collection Time: 05/25/19  2:45 AM  Result Value Ref Range   D-Dimer, Quant 1.15 (H) 0.00 - 0.50 ug/mL-FEU    Comment: (NOTE) At the manufacturer cut-off of 0.50 ug/mL FEU, this assay has been documented to exclude PE with a sensitivity and negative predictive value of 97 to 99%.  At this time, this assay has not been approved by the FDA to exclude DVT/VTE. Results should be correlated with clinical presentation. Performed at [facility], [place] [address] [place], [place] [address]   Troponin I (High Sensitivity)     Status: None   Collection Time: 05/25/19  4:45 AM  Result Value Ref Range   Troponin I (High Sensitivity) 6 <18 ng/L    Comment: (NOTE) Elevated high sensitivity troponin I (hsTnI) values and significant  changes across serial measurements may suggest ACS but many other  chronic and acute conditions are known to elevate hsTnI results.  Refer to the "Links" section for chest pain algorithms and additional  guidance. Performed at [facility], [place] [address], [place], [place] [address]   SARS CORONAVIRUS 2 (TAT 6-24 HRS)  Nasopharyngeal Nasopharyngeal Swab     Status: None   Collection Time: 05/25/19  5:18 AM   Specimen: Nasopharyngeal Swab  Result Value Ref Range   SARS Coronavirus 2 NEGATIVE NEGATIVE    Comment: (NOTE) SARS-CoV-2 target nucleic acids are NOT DETECTED. The SARS-CoV-2 RNA is generally detectable in upper and lower respiratory specimens during the acute  phase of infection. Negative results do not preclude SARS-CoV-2 infection, do not rule out co-infections with other pathogens, and should not be used as the sole basis for treatment or other patient management decisions. Negative results must be combined with clinical observations, patient history, and epidemiological information. The expected result is Negative. Fact Sheet for Patients: SugarRoll.be Fact Sheet for Healthcare Providers: https://www.woods-mathews.com/ This test is not yet approved or cleared by the [place] FDA and  has been authorized for detection and/or diagnosis of SARS-CoV-2 by FDA under an Emergency Use Authorization (EUA). This EUA will remain  in effect (meaning this test can be used) for the duration of the COVID-19 declaration under Section 56 4(b)(1) of the Act, 21 U.S.C. section 360bbb-3(b)(1), unless the authorization is terminated or revoked sooner. Performed at [facility], [place] [address], [place], [place] [address]   TSH     Status: None   Collection Time: 05/25/19  6:18 AM  Result Value Ref Range   TSH 0.767 0.350 - 4.500 uIU/mL    Comment: Performed by a 3rd Generation assay with a functional sensitivity of <=0.01 uIU/mL. Performed at [facility], [place] [address], [place], [place] [address]   CBG monitoring, ED     Status: Abnormal   Collection Time: 05/25/19  8:16 AM  Result Value Ref Range   Glucose-Capillary 139 (H) 70 - 99 mg/dL   Comment 1 Notify RN    Comment 2 Document in Chart   Basic metabolic panel     Status: Abnormal    Collection Time: 05/25/19  1:56 PM  Result Value Ref Range   Sodium 139 135 - 145 mmol/L   Potassium 5.4 (H) 3.5 - 5.1 mmol/L   Chloride 106 98 - 111 mmol/L   CO2 23 22 - 32 mmol/L   Glucose, Bld 156 (H) 70 - 99 mg/dL   BUN 48 (H) 8 - 23 mg/dL   Creatinine, Ser 1.52 (H) 0.44 - 1.00 mg/dL   Calcium 9.2 8.9 - 10.3 mg/dL   GFR calc non Af Amer 30 (L) >60 mL/min   GFR calc Af Amer 34 (L) >60 mL/min   Anion gap 10 5 - 15    Comment: Performed at [facility] [address], [place], [place] [address]  Hepatic function panel     Status: Abnormal   Collection Time: 05/25/19  1:56 PM  Result Value Ref Range   Total Protein 6.2 (L) 6.5 - 8.1 g/dL   Albumin 3.4 (L) 3.5 - 5.0 g/dL   AST 16 15 - 41 U/L   ALT 14 0 - 44 U/L   Alkaline Phosphatase 30 (L) 38 - 126 U/L   Total Bilirubin 0.5 0.3 - 1.2 mg/dL   Bilirubin, Direct <0.1 0.0 - 0.2 mg/dL   Indirect Bilirubin NOT CALCULATED 0.3 - 0.9 mg/dL    Comment: Performed at [facility] [address], [place], [place] [address]  CBG monitoring, ED     Status: Abnormal   Collection Time: 05/25/19  1:58 PM  Result Value Ref Range   Glucose-Capillary 139 (H) 70 - 99 mg/dL  CBG monitoring, ED     Status: Abnormal   Collection Time: 05/25/19  5:07 PM  Result Value Ref Range   Glucose-Capillary 128 (H) 70 - 99 mg/dL   DG Chest 2 View  Result Date: 05/25/2019 CLINICAL DATA:  Shortness of breath EXAM: CHEST - 2 VIEW COMPARISON:  March 03, 2014 FINDINGS: The  heart size and mediastinal contours are within normal limits. Aortic knob calcifications. Both lungs are clear. No acute osseous abnormality. IMPRESSION: No active cardiopulmonary disease. Electronically Signed   By: Prudencio Pair M.D.   On: 05/25/2019 01:52   VAS [place] LOWER EXTREMITY VENOUS (DVT)  Result Date: 05/25/2019  Lower Venous Study Indications: Edema, and SOB.  Limitations: Body habitus. Performing Technologist: Antonieta Pert RDMS, RVT  Examination Guidelines: A complete  evaluation includes B-mode imaging, spectral Doppler, color Doppler, and power Doppler as needed of all accessible portions of each vessel. Bilateral testing is considered an integral part of a complete examination. Limited examinations for reoccurring indications may be performed as noted.  +---------+---------------+---------+-----------+----------+--------------+ RIGHT    CompressibilityPhasicitySpontaneityPropertiesThrombus Aging +---------+---------------+---------+-----------+----------+--------------+ CFV      Full           Yes      Yes                                 +---------+---------------+---------+-----------+----------+--------------+ SFJ      Full                                                        +---------+---------------+---------+-----------+----------+--------------+ FV Prox  Full                                                        +---------+---------------+---------+-----------+----------+--------------+ FV Mid   Full                                                        +---------+---------------+---------+-----------+----------+--------------+ FV DistalFull                                                        +---------+---------------+---------+-----------+----------+--------------+ PFV      Full                                                        +---------+---------------+---------+-----------+----------+--------------+ POP      Full           Yes      Yes                                 +---------+---------------+---------+-----------+----------+--------------+ PTV      Full                                                        +---------+---------------+---------+-----------+----------+--------------+  PERO     Full                                                        +---------+---------------+---------+-----------+----------+--------------+ GSV      Full                                                         +---------+---------------+---------+-----------+----------+--------------+   +---------+---------------+---------+-----------+----------+--------------+ LEFT     CompressibilityPhasicitySpontaneityPropertiesThrombus Aging +---------+---------------+---------+-----------+----------+--------------+ CFV      Full           Yes      Yes                                 +---------+---------------+---------+-----------+----------+--------------+ SFJ      Full                                                        +---------+---------------+---------+-----------+----------+--------------+ FV Prox  Full                                                        +---------+---------------+---------+-----------+----------+--------------+ FV Mid   Full                                                        +---------+---------------+---------+-----------+----------+--------------+ FV DistalFull                                                        +---------+---------------+---------+-----------+----------+--------------+ PFV      Full                                                        +---------+---------------+---------+-----------+----------+--------------+ POP      Full           Yes      Yes                                 +---------+---------------+---------+-----------+----------+--------------+ PTV      Full                                                        +---------+---------------+---------+-----------+----------+--------------+  PERO     Full                                                        +---------+---------------+---------+-----------+----------+--------------+ GSV      Full                                                        +---------+---------------+---------+-----------+----------+--------------+     Summary: Right: There is no evidence of deep vein thrombosis in the lower extremity. No cystic structure  found in the popliteal fossa. Left: There is no evidence of deep vein thrombosis in the lower extremity. No cystic structure found in the popliteal fossa.  *See table(s) above for measurements and observations.    Preliminary     Pending Labs Unresulted Labs (From admission, onward)    Start     Ordered   06/01/19 0500  Creatinine, serum  (enoxaparin (LOVENOX)    CrCl < 30 ml/min)  Weekly,   R    Comments: while on enoxaparin therapy.    05/25/19 0544   05/26/19 0500  Comprehensive metabolic panel  Tomorrow morning,   R     05/25/19 0544   05/26/19 0500  CBC  Tomorrow morning,   R     05/25/19 0544          Vitals/Pain Today's Vitals   05/25/19 1530 05/25/19 1545 05/25/19 1600 05/25/19 1716  BP: (!) 163/48 (!) 156/45 (!) 151/48 (!) 177/46  Pulse: 72 62 64 60  Resp: (!) 21 12 20 18   Temp:      TempSrc:      SpO2: 100% 100% 100% 100%  PainSc:        Isolation Precautions No active isolations  Medications Medications  enoxaparin (LOVENOX) injection 30 mg (30 mg Subcutaneous Given 05/25/19 0613)  acetaminophen (TYLENOL) tablet 650 mg (has no administration in time range)    Or  acetaminophen (TYLENOL) suppository 650 mg (has no administration in time range)  aspirin EC tablet 81 mg (81 mg Oral Given 05/25/19 1103)  amLODipine (NORVASC) tablet 5 mg (5 mg Oral Given 05/25/19 1104)  bisoprolol (ZEBETA) tablet 5 mg (5 mg Oral Given 05/25/19 1801)  furosemide (LASIX) tablet 20 mg (20 mg Oral Given 05/25/19 1104)  simvastatin (ZOCOR) tablet 20 mg (has no administration in time range)  levothyroxine (SYNTHROID) tablet 50 mcg (has no administration in time range)  famotidine (PEPCID) tablet 20 mg (has no administration in time range)  pantoprazole (PROTONIX) EC tablet 40 mg (40 mg Oral Given 05/25/19 1104)  cholecalciferol (VITAMIN D3) tablet 1,000 Units (1,000 Units Oral Given 05/25/19 1104)  multivitamin with minerals tablet (1 tablet Oral Given 05/25/19 1105)  ICaps CAPS ( Oral Not  Given 05/25/19 1347)  mometasone-formoterol (DULERA) 100-5 MCG/ACT inhaler 2 puff (2 puffs Inhalation Not Given 05/25/19 1347)  loratadine (CLARITIN) tablet 10 mg (has no administration in time range)  dorzolamide-timolol (COSOPT) 22.3-6.8 MG/ML ophthalmic solution 1 drop (1 drop Both Eyes Not Given 05/25/19 1347)  sodium zirconium cyclosilicate (LOKELMA) packet 10 g (10 g Oral Given 05/25/19 1802)  perflutren lipid microspheres (DEFINITY) IV suspension (3 mLs Intravenous Given 05/25/19 1753)  albuterol (VENTOLIN HFA) 108 (90 Base) MCG/ACT inhaler 4 puff (4 puffs Inhalation Given 05/25/19 0307)  sodium zirconium cyclosilicate (LOKELMA) packet 10 g (10 g Oral Given 05/25/19 0437)  calcium gluconate 1 g in sodium chloride 0.9 % 100 mL IVPB (0 g Intravenous Stopped 05/25/19 0621)  sodium chloride 0.9 % bolus 1,000 mL (0 mLs Intravenous Stopped 05/25/19 0621)  sodium bicarbonate injection 50 mEq (50 mEq Intravenous Given 05/25/19 [id number])    Mobility walks Low fall risk   Focused Assessments    R Recommendations: See Admitting Provider Note  Report given to:   Additional Notes:

## 2019-05-25 NOTE — Progress Notes (Addendum)
Patient seen and examined, admitted earlier this morning by Dr. Maudie Mercury, please see his H&P for details briefly this is a 86 year old female with history of asthma, CKD stage III, type 2 diabetes mellitus, hypertension, dyslipidemia presented to the emergency room with acute onset dyspnea overnight. -She denies any cough congestion wheezing or chest tightness, denies fever or chills, denies lower extremity edema orthopnea or PND, denies chest pain -Work-up in the ED noted mild worsening creatinine to 1.7, potassium of  6.0, high-sensitivity troponin was 6 x2, D-dimer was 1.1, chest x-ray was clear  AKI/Hyperkalemia, on CKD 3 -likely due to ARB/lasix use -ARB on hold, treated with calcium gluconate, Lokelma -Repeat labs today, creatinine not far from baseline -Clinically appears euvolemic at this time   Acute dyspnea -Etiology is unclear at this time, appears to have improved -EKG without considerable changes and high-sensitivity troponin negative x2 -Does not have hypoxia at this time, chest x-ray is clear, lungs are clear, no edema -Covid PCR is pending, follow-up echo -D-dimer is mildly elevated, but no symptoms of PE, follow-up echo to assess RV and check dopplers  Asthma -Appears stable, no wheezing at this time -Continue bronchodilators as needed  Type 2 diabetes mellitus -Holding Metformin, patient declines sliding scale insulin  Hypertension -Stable, continue bisoprolol, amlodipine  Domenic Polite, MD

## 2019-05-26 DIAGNOSIS — N179 Acute kidney failure, unspecified: Secondary | ICD-10-CM | POA: Diagnosis not present

## 2019-05-26 DIAGNOSIS — R0600 Dyspnea, unspecified: Secondary | ICD-10-CM | POA: Diagnosis not present

## 2019-05-26 DIAGNOSIS — N183 Chronic kidney disease, stage 3 unspecified: Secondary | ICD-10-CM | POA: Diagnosis not present

## 2019-05-26 LAB — CBC
HCT: 29.6 % — ABNORMAL LOW (ref 36.0–46.0)
Hemoglobin: 9.8 g/dL — ABNORMAL LOW (ref 12.0–15.0)
MCH: 29.8 pg (ref 26.0–34.0)
MCHC: 33.1 g/dL (ref 30.0–36.0)
MCV: 90 fL (ref 80.0–100.0)
Platelets: 163 10*3/uL (ref 150–400)
RBC: 3.29 MIL/uL — ABNORMAL LOW (ref 3.87–5.11)
RDW: 13 % (ref 11.5–15.5)
WBC: 7.2 10*3/uL (ref 4.0–10.5)
nRBC: 0 % (ref 0.0–0.2)

## 2019-05-26 LAB — COMPREHENSIVE METABOLIC PANEL
ALT: 13 U/L (ref 0–44)
AST: 15 U/L (ref 15–41)
Albumin: 3.2 g/dL — ABNORMAL LOW (ref 3.5–5.0)
Alkaline Phosphatase: 27 U/L — ABNORMAL LOW (ref 38–126)
Anion gap: 9 (ref 5–15)
BUN: 42 mg/dL — ABNORMAL HIGH (ref 8–23)
CO2: 27 mmol/L (ref 22–32)
Calcium: 9.3 mg/dL (ref 8.9–10.3)
Chloride: 106 mmol/L (ref 98–111)
Creatinine, Ser: 1.46 mg/dL — ABNORMAL HIGH (ref 0.44–1.00)
GFR calc Af Amer: 36 mL/min — ABNORMAL LOW (ref >60)
GFR calc non Af Amer: 31 mL/min — ABNORMAL LOW (ref >60)
Glucose, Bld: 105 mg/dL — ABNORMAL HIGH (ref 70–99)
Potassium: 4.8 mmol/L (ref 3.5–5.1)
Sodium: 142 mmol/L (ref 135–145)
Total Bilirubin: 0.5 mg/dL (ref 0.3–1.2)
Total Protein: 5.8 g/dL — ABNORMAL LOW (ref 6.5–8.1)

## 2019-05-26 LAB — GLUCOSE, CAPILLARY: Glucose-Capillary: 123 mg/dL — ABNORMAL HIGH (ref 70–99)

## 2019-05-26 MED ORDER — BISOPROLOL FUMARATE 5 MG PO TABS: 5.0000 mg | ORAL_TABLET | Freq: Every day | ORAL | Status: DC

## 2019-05-26 MED ORDER — AMLODIPINE BESYLATE 10 MG PO TABS
10.0000 mg | ORAL_TABLET | Freq: Every day | ORAL | Status: DC
  Administered 2019-05-26: 10 mg via ORAL
  Filled 2019-05-26: qty 1

## 2019-05-26 MED ORDER — AMLODIPINE BESYLATE 5 MG PO TABS: 10.0000 mg | ORAL_TABLET | Freq: Every day | ORAL | Status: DC

## 2019-05-26 NOTE — Care Management Obs Status (Signed)
[facility] NOTIFICATION   Patient Details  Name: Mikayla [name] MRN: [id number] Date of Birth: [birth year]   Medicare Observation Status Notification Given:  Yes    Carles Collet, RN 05/26/2019, 9:21 AM

## 2019-05-26 NOTE — Progress Notes (Signed)
Patient ambulated out to hallway and back to bed.  O2 sat maintained at 97% on room air during ambulation.  Patient denied any SOB during ambulation.

## 2019-05-27 ENCOUNTER — Ambulatory Visit (INDEPENDENT_AMBULATORY_CARE_PROVIDER_SITE_OTHER): Payer: Medicare Other | Admitting: Adult Health

## 2019-05-27 ENCOUNTER — Telehealth: Payer: Self-pay | Admitting: Internal Medicine

## 2019-05-27 ENCOUNTER — Other Ambulatory Visit: Payer: Self-pay

## 2019-05-27 ENCOUNTER — Encounter: Payer: Self-pay | Admitting: Adult Health

## 2019-05-27 DIAGNOSIS — N183 Chronic kidney disease, stage 3 unspecified: Secondary | ICD-10-CM | POA: Diagnosis not present

## 2019-05-27 DIAGNOSIS — I10 Essential (primary) hypertension: Secondary | ICD-10-CM | POA: Diagnosis not present

## 2019-05-27 DIAGNOSIS — E875 Hyperkalemia: Secondary | ICD-10-CM

## 2019-05-27 DIAGNOSIS — J4531 Mild persistent asthma with (acute) exacerbation: Secondary | ICD-10-CM

## 2019-05-27 NOTE — Telephone Encounter (Signed)
Spoke with the pt  She states that she was recently hospitalized with SOB  She was found to have elevated K and states her ECHO was abnormal  She says she has some questions she wants to discuss about her diet and meds  MW not here in the office  Wants to talk with TP  Televisit today at 2 pm was scheduled

## 2019-05-27 NOTE — Progress Notes (Signed)
Virtual Visit via Telephone Note  I connected with Mikayla [name] on 05/27/19 at  2:00 PM EST by telephone and verified that I am speaking with the correct person using two identifiers.  Location: Patient: Home  Provider: Office    I discussed the limitations, risks, security and privacy concerns of performing an evaluation and management service by telephone and the availability of in person appointments. I also discussed with the patient that there may be a patient responsible charge related to this service. The patient expressed understanding and agreed to proceed.   History of Present Illness: 85 year old female never smoker followed for diabetes, hypertension and hyperlipidemia and mild asthma.  Today's televisit is a post hospital follow-up.  Patient has underlying hypertension.  She was seen for a routine follow-up on January 5.  Lab work showed that she had a slightly elevated potassium at 5.6.  Chronic kidney disease was stable at creatinine at 1.4.  She was recommended to decrease her Avapro by half.  And for repeat labs.  Unfortunately patient says few days later she had significant shortness of breath and ultimately went to the emergency room.  She was admitted for hyperkalemia and dyspnea.  Potassium was 6.0.  Renal function had worsened with serum creatinine at 1.7.  Her Avapro was discontinued.  Norvasc was increased to 10 mg. Venous Dopplers were negative for DVT.  2D echo showed EF at 60 to 65% with severely increased left ventricle hypertrophy.  Severe asymmetry hypertrophy in the basal septum noted.  Since discharge patient says she is feeling better.  She does feel weak.  Shortness of breath is back to baseline.  Discharge potassium was back to normal.  And serum creatinine had improved to 1.4.  We discussed healthy diet with low sodium intake.  To avoid nonsteroidals.  She denies any chest pain orthopnea PND or increased leg swelling   Patient Active Problem List   Diagnosis Date Noted  . Dyspnea 05/25/2019  . Hyperkalemia 05/25/2019  . AKI (acute kidney injury) ([place]) 05/25/2019  . Chronic renal disease, stage III 05/21/2019  . Anemia, normocytic normochromic 12/19/2017  . Acute upper respiratory infection 07/25/2016  . Hypothyroidism, acquired 05/04/2016  . Fatigue 09/22/2014  . Essential hypertension 03/03/2014  . Back pain 02/04/2012  . Mild intermittent extrinsic asthma without complication 53/64/6803  . PELVIC MASS 06/25/2008  . CYSTITIS, ACUTE 06/09/2008  . Diabetes mellitus type 2 in obese ([place]) 06/10/2007  . Hyperlipidemia associated with type 2 diabetes mellitus ([place]) 06/10/2007  . OBESITY 06/10/2007  . RHINITIS 06/10/2007  . Osteoporosis/osteopenia increased risk 06/10/2007   Current Outpatient Medications on File Prior to Visit  Medication Sig Dispense Refill  . acetaminophen (TYLENOL) 325 MG tablet Take 325 mg by mouth every 6 (six) hours as needed for mild pain or fever.     Marland Kitchen amLODipine (NORVASC) 5 MG tablet Take 2 tablets (10 mg total) by mouth daily.    Marland Kitchen aspirin 81 MG tablet Take 81 mg by mouth daily.     . bisoprolol (ZEBETA) 5 MG tablet Take 1 tablet (5 mg total) by mouth daily.    . budesonide-formoterol (SYMBICORT) 80-4.5 MCG/ACT inhaler Inhale 2 puffs into the lungs 2 (two) times daily as needed (shortness of breath/wheezing). 1 Inhaler 0  . calcium carbonate (TUMS EX) 750 MG chewable tablet Chew 1 tablet by mouth 3 (three) times daily with meals.     . Cholecalciferol (VITAMIN D) 1000 UNITS capsule Take 1,000 Units by mouth daily.      Marland Kitchen  cyclobenzaprine (FLEXERIL) 10 MG tablet Take 1 tablet (10 mg total) by mouth 3 (three) times daily as needed for muscle spasms. 30 tablet 1  . dextromethorphan (DELSYM) 30 MG/5ML liquid Take 60 mg by mouth every 12 (twelve) hours as needed for cough.     . dorzolamide-timolol (COSOPT) 22.3-6.8 MG/ML ophthalmic solution Place 1 drop into both eyes 2 (two) times daily.     . famotidine  (PEPCID) 20 MG tablet Take 20 mg by mouth at bedtime as needed for heartburn (COUGH FLARE).     . furosemide (LASIX) 20 MG tablet TAKE 1 TABLET BY MOUTH DAILY. (Patient taking differently: Take 20 mg by mouth daily. ) 90 tablet 3  . Homeopathic Products ([employer] LEG CRAMPS PAIN RELIEF PO) Take 1 tablet by mouth as needed (for leg cramps).     Marland Kitchen levothyroxine (SYNTHROID) 50 MCG tablet Take 1 tablet (50 mcg total) by mouth daily before breakfast. 30 tablet 5  . loratadine (CLARITIN) 10 MG tablet Take 10 mg by mouth daily as needed (DRIPPY NOSE/WATERY EYE/INTIATIVE).     Marland Kitchen metFORMIN (GLUCOPHAGE) 500 MG tablet TAKE 1 TABLET BY MOUTH 2 TIMES A DAY WITH A MEAL. (Patient taking differently: Take 500 mg by mouth 2 (two) times daily with a meal. ) 180 tablet 1  . Multiple Vitamins-Minerals (CENTRUM SILVER PO) Take 1 tablet by mouth daily.      . Multiple Vitamins-Minerals (ICAPS PO) Take 1 tablet by mouth daily.     Marland Kitchen omeprazole (PRILOSEC) 40 MG capsule TAKE 1 CAPSULE BY MOUTH DAILY. (Patient taking differently: Take 40 mg by mouth daily. ) 30 capsule 11  . simvastatin (ZOCOR) 20 MG tablet Take 1 tablet (20 mg total) by mouth at bedtime. 90 tablet 3   No current facility-administered medications on file prior to visit.    Observations/Objective: 05/27/2019 speaks in full sentences with no audible wheezing.   Assessment and Plan: Hypertension-recent changes in medications due to hyperkalemia and chronic kidney disease. We will have her return to the office in 2 weeks for follow-up and be met to check her labs  Hyperkalemia-felt secondary to underlying chronic kidney disease and use of ARB.  Patient is to remain off of Avapro.  We will continue on Norvasc.  Check be met in 2 weeks  Asthma appears to be under control.  LVH-2D echo shows severely increased left ventricle hypertrophy and severe asymmetry hypertrophy in the basal septum.  Suspect this is secondary to longstanding hypertension and age.   Continue on diuretics and avoid volume overload.  Manage blood pressure appropriately.  Chronic kidney disease continue to monitor closely.  Consider nephrology consult going forward Check be met in 2 weeks  Plan  Patient Instructions  Continue on current regimen Low-salt diet Follow-up in 2 weeks with Dr. Melvyn Novas  And As needed   with labs Please contact office for sooner follow up if symptoms do not improve or worsen or seek emergency care    '   Follow Up Instructions: Follow-up in 2 weeks and as needed Please contact office for sooner follow up if symptoms do not improve or worsen or seek emergency care     I discussed the assessment and treatment plan with the patient. The patient was provided an opportunity to ask questions and all were answered. The patient agreed with the plan and demonstrated an understanding of the instructions.   The patient was advised to call back or seek an in-person evaluation if the symptoms worsen or if  the condition fails to improve as anticipated.  I provided 22 minutes of non-face-to-face time during this encounter.   Rexene Edison, NP

## 2019-05-27 NOTE — Patient Instructions (Addendum)
Continue on current regimen Low-salt diet Follow-up in 2 weeks with Dr. Melvyn Novas  And As needed   with labs Please contact office for sooner follow up if symptoms do not improve or worsen or seek emergency care

## 2019-05-29 ENCOUNTER — Telehealth: Payer: Self-pay | Admitting: Adult Health

## 2019-05-29 ENCOUNTER — Other Ambulatory Visit: Payer: Self-pay | Admitting: Internal Medicine

## 2019-05-29 NOTE — Telephone Encounter (Signed)
lmtcb for pt.  

## 2019-05-29 NOTE — Telephone Encounter (Signed)
Pt called back and wanted to let Alger Memos that she received the message and will call the pharmacy. Nothing further needed.

## 2019-05-30 ENCOUNTER — Telehealth: Payer: Self-pay | Admitting: Internal Medicine

## 2019-05-30 MED ORDER — AMLODIPINE BESYLATE 10 MG PO TABS: 10.0000 mg | ORAL_TABLET | Freq: Every day | ORAL | 1 refills | Status: DC

## 2019-05-30 MED ORDER — BISOPROLOL FUMARATE 5 MG PO TABS: 5.0000 mg | ORAL_TABLET | Freq: Two times a day (BID) | ORAL | 1 refills | Status: DC

## 2019-05-30 NOTE — Telephone Encounter (Signed)
Patient called requesting refills on her Amlodipine and Bisoprolol Refills called in to [facility] 90 day supply per pharmacist Med list updated

## 2019-06-10 ENCOUNTER — Other Ambulatory Visit: Payer: Self-pay

## 2019-06-10 ENCOUNTER — Ambulatory Visit (INDEPENDENT_AMBULATORY_CARE_PROVIDER_SITE_OTHER): Payer: Medicare Other | Admitting: Adult Health

## 2019-06-10 ENCOUNTER — Encounter: Payer: Self-pay | Admitting: Adult Health

## 2019-06-10 VITALS — BP 124/66 | HR 59 | Temp 97.8°F | Ht 60.0 in | Wt 140.0 lb

## 2019-06-10 DIAGNOSIS — R0989 Other specified symptoms and signs involving the circulatory and respiratory systems: Secondary | ICD-10-CM | POA: Insufficient documentation

## 2019-06-10 DIAGNOSIS — D649 Anemia, unspecified: Secondary | ICD-10-CM

## 2019-06-10 DIAGNOSIS — E875 Hyperkalemia: Secondary | ICD-10-CM | POA: Diagnosis not present

## 2019-06-10 DIAGNOSIS — E785 Hyperlipidemia, unspecified: Secondary | ICD-10-CM | POA: Diagnosis not present

## 2019-06-10 DIAGNOSIS — I10 Essential (primary) hypertension: Secondary | ICD-10-CM

## 2019-06-10 DIAGNOSIS — J4520 Mild intermittent asthma, uncomplicated: Secondary | ICD-10-CM | POA: Diagnosis not present

## 2019-06-10 DIAGNOSIS — E1169 Type 2 diabetes mellitus with other specified complication: Secondary | ICD-10-CM | POA: Diagnosis not present

## 2019-06-10 DIAGNOSIS — N183 Chronic kidney disease, stage 3 unspecified: Secondary | ICD-10-CM | POA: Diagnosis not present

## 2019-06-10 DIAGNOSIS — E669 Obesity, unspecified: Secondary | ICD-10-CM

## 2019-06-10 LAB — BASIC METABOLIC PANEL
BUN: 43 mg/dL — ABNORMAL HIGH (ref 6–23)
CO2: 27 mEq/L (ref 19–32)
Calcium: 9.7 mg/dL (ref 8.4–10.5)
Chloride: 100 mEq/L (ref 96–112)
Creatinine, Ser: 1.45 mg/dL — ABNORMAL HIGH (ref 0.40–1.20)
GFR: 33.78 mL/min — ABNORMAL LOW (ref >60.00)
Glucose, Bld: 145 mg/dL — ABNORMAL HIGH (ref 70–99)
Potassium: 4.5 mEq/L (ref 3.5–5.1)
Sodium: 134 mEq/L — ABNORMAL LOW (ref 135–145)

## 2019-06-10 NOTE — Assessment & Plan Note (Addendum)
Controlled on present regimen.  Now off of ARB due to renal function and hyperkalemia. Continue with good blood pressure control. Be met pending  2D echo showed severe LVH with preserved EF.  She has no evidence on exam of volume overload.  Patient is encouraged on a low-salt diet.  Continue with blood pressure control.  Discussed referral to cardiology however patient wants to hold off at this time.

## 2019-06-10 NOTE — Assessment & Plan Note (Signed)
Recent hospitalization with hyperkalemia.  ARB is discontinued.  Be met today shows resolution. 

## 2019-06-10 NOTE — Assessment & Plan Note (Signed)
Compensated without flare-patient has Symbicort to use if asthma symptoms start up. No change in therapy .

## 2019-06-10 NOTE — Progress Notes (Signed)
_0  ID: Mikayla [name], female    DOB: 22-Mar-1926, 86 y.o.   MRN: [id number]  Chief Complaint  Patient presents with  . Follow-up    HTN     Referring provider: Tanda Rockers, MD  HPI: 86 year old female never smoker followed for diabetes, hypertension, hyperlipidemia, CKD, DM  and mild intermittent asthma  TEST/EVENTS :    06/10/2019 Follow up : Hypertension, chronic kidney disease and asthma Patient presents for a 2-week follow-up.  Patient was recently hospitalized earlier this month for acute respiratory distress with shortness of breath.  She was found to have worsening kidney function and hyperkalemia.  Her Avapro was discontinued and Norvasc was increased to manage hypertension.  Venous Dopplers were negative for a DVT.  2D echo showed EF at 60 to 65% with severely increased left ventricle hypertrophy.  Severe asymmetry hypertrophy in the basal septum.  Since discharge patient says she is feeling better.  She has been taking it easy and trying to eat healthier.  She has multiple questions regarding her recent hospitalization and medical issues. We discussed that she has had chronic renal insufficiency that has slowly progressed over the last few years.  She has underlying hypertension and diabetes which I explained contributes to her worsening renal disease.  She is aware to avoid nonsteroidals.  We also went over her recent echo that showed significant left ventricle hypertrophy and basal septum hypertrophy.  Patient has known hyperlipidemia but declines statin therapy.  Wants to manage with diet alone. She has underlying diabetes that has been relatively controlled on Metformin.  Last A1c was 6.9 on 05/20/2019. Patient says she has had some left eye irritation.  She denies any loss of vision however says it has been more difficult for her to read.  She is also has some redness along the left eye as well.  No discharge no known injury.  She denies any loss of speech facial  weakness or arm weakness. Patient lives alone.  Is very independent.  She does not drive however does exceptionally well for her age   Allergies  Allergen Reactions  . Amoxicillin Rash  . Lipitor [Atorvastatin]     REACTION: aches    Immunization History  Administered Date(s) Administered  . Fluad Quad(high Dose 65+) 02/25/2019  . Influenza Split 01/25/2011, 01/31/2012  . Influenza Whole 02/28/2007, 04/02/2008, 03/12/2009, 02/10/2010  . Influenza, High Dose Seasonal PF 02/01/2016, 03/13/2017, 01/29/2018  . Influenza,inj,Quad PF,6+ Mos 04/02/2013, 03/03/2014, 04/20/2015  . Pneumococcal Conjugate-13 03/03/2014  . Pneumococcal Polysaccharide-23 06/04/2012  . Pneumococcal-Unspecified 09/12/2000  . Td 08/13/2006  . Tdap 12/05/2016    Past Medical History:  Diagnosis Date  . Chronic rhinitis   . CKD (chronic kidney disease), stage III   . Disorder of bone and cartilage, unspecified   . Essential hypertension, benign   . Obesity, unspecified   . Other and unspecified hyperlipidemia   . Type II or unspecified type diabetes mellitus without mention of complication, not stated as uncontrolled     Tobacco History: Social History   Tobacco Use  Smoking Status Never Smoker  Smokeless Tobacco Never Used   Counseling given: Not Answered   Outpatient Medications Prior to Visit  Medication Sig Dispense Refill  . acetaminophen (TYLENOL) 325 MG tablet Take 325 mg by mouth every 6 (six) hours as needed for mild pain or fever.     Marland Kitchen amLODipine (NORVASC) 10 MG tablet Take 1 tablet (10 mg total) by mouth daily. 90 tablet 1  .  aspirin 81 MG tablet Take 81 mg by mouth daily.     . bisoprolol (ZEBETA) 5 MG tablet Take 1 tablet (5 mg total) by mouth 2 (two) times daily. 180 tablet 1  . budesonide-formoterol (SYMBICORT) 80-4.5 MCG/ACT inhaler Inhale 2 puffs into the lungs 2 (two) times daily as needed (shortness of breath/wheezing). 1 Inhaler 0  . calcium carbonate (TUMS EX) 750 MG chewable  tablet Chew 1 tablet by mouth 3 (three) times daily with meals.     . Cholecalciferol (VITAMIN D) 1000 UNITS capsule Take 1,000 Units by mouth daily.      . cyclobenzaprine (FLEXERIL) 10 MG tablet Take 1 tablet (10 mg total) by mouth 3 (three) times daily as needed for muscle spasms. 30 tablet 1  . dextromethorphan (DELSYM) 30 MG/5ML liquid Take 60 mg by mouth every 12 (twelve) hours as needed for cough.     . dorzolamide-timolol (COSOPT) 22.3-6.8 MG/ML ophthalmic solution Place 1 drop into both eyes 2 (two) times daily.     . famotidine (PEPCID) 20 MG tablet Take 20 mg by mouth at bedtime as needed for heartburn (COUGH FLARE).     . furosemide (LASIX) 20 MG tablet TAKE 1 TABLET BY MOUTH DAILY. (Patient taking differently: Take 20 mg by mouth daily. ) 90 tablet 3  . Homeopathic Products ([employer] LEG CRAMPS PAIN RELIEF PO) Take 1 tablet by mouth as needed (for leg cramps).     Marland Kitchen levothyroxine (SYNTHROID) 50 MCG tablet Take 1 tablet (50 mcg total) by mouth daily before breakfast. 30 tablet 5  . loratadine (CLARITIN) 10 MG tablet Take 10 mg by mouth daily as needed (DRIPPY NOSE/WATERY EYE/INTIATIVE).     Marland Kitchen metFORMIN (GLUCOPHAGE) 500 MG tablet Take 1 tablet (500 mg total) by mouth 2 (two) times daily with a meal. 180 tablet 1  . Multiple Vitamins-Minerals (CENTRUM SILVER PO) Take 1 tablet by mouth daily.      . Multiple Vitamins-Minerals (ICAPS PO) Take 1 tablet by mouth daily.     Marland Kitchen omeprazole (PRILOSEC) 40 MG capsule TAKE 1 CAPSULE BY MOUTH DAILY. (Patient taking differently: Take 40 mg by mouth daily. ) 30 capsule 11  . simvastatin (ZOCOR) 20 MG tablet Take 1 tablet (20 mg total) by mouth at bedtime. 90 tablet 3   No facility-administered medications prior to visit.     Review of Systems:   Constitutional:   No  weight loss, night sweats,  Fevers, chills,  +fatigue, or  lassitude.  HEENT:   No headaches,  Difficulty swallowing,  Tooth/dental problems, or  Sore throat,                No sneezing,  itching, ear ache, nasal congestion, post nasal drip,  Positive left eye irritation  CV:  No chest pain,  Orthopnea, PND, swelling in lower extremities, anasarca, dizziness, palpitations, syncope.   GI  No heartburn, indigestion, abdominal pain, nausea, vomiting, diarrhea, change in bowel habits, loss of appetite, bloody stools.   Resp: No shortness of breath with exertion or at rest.  No excess mucus, no productive cough,  No non-productive cough,  No coughing up of blood.  No change in color of mucus.  No wheezing.  No chest wall deformity  Skin: no rash or lesions.  GU: no dysuria, change in color of urine, no urgency or frequency.  No flank pain, no hematuria   MS:  No joint pain or swelling.  No decreased range of motion.  No back pain.  Physical Exam  BP 124/66 (BP Location: Left Arm, Cuff Size: Normal)   Pulse (!) 59   Temp 97.8 F (36.6 C) (Temporal)   Ht 5' (1.524 m)   Wt 140 lb (63.5 kg)   SpO2 99% Comment: RA  BMI 27.34 kg/m   GEN: A/Ox3; pleasant , NAD, well nourished    HEENT:  [place]/AT,   NOSE-clear, THROAT-clear, no lesions, no postnasal drip or exudate noted.  Left eye redness no discharge noted  NECK:  Supple w/ fair ROM; no JVD; right carotid without bruit.  Left carotid bruit noted  no thyromegaly or nodules palpated; no lymphadenopathy.    RESP  Clear  P & A; w/o, wheezes/ rales/ or rhonchi. no accessory muscle use, no dullness to percussion  CARD:  RRR, no m/r/g, no peripheral edema, pulses intact, no cyanosis or clubbing.  GI:   Soft & nt; nml bowel sounds; no organomegaly or masses detected.   Musco: Warm bil, no deformities or joint swelling noted.   Neuro: alert, no focal deficits noted.    Skin: Warm, no lesions or rashes    Lab Results:  CBC Latest Ref Rng & Units 05/26/2019 05/25/2019 05/20/2019  WBC 4.0 - 10.5 K/uL 7.2 11.6(H) 7.0  Hemoglobin 12.0 - 15.0 g/dL 9.8(L) 11.3(L) 11.0(L)  Hematocrit 36.0 - 46.0 % 29.6(L) 34.7(L) 33.1(L)    Platelets 150 - 400 K/uL 163 165 192.0     CBC    Component Value Date/Time   WBC 7.2 05/26/2019 0250   RBC 3.29 (L) 05/26/2019 0250   HGB 9.8 (L) 05/26/2019 0250   HCT 29.6 (L) 05/26/2019 0250   PLT 163 05/26/2019 0250   MCV 90.0 05/26/2019 0250   MCH 29.8 05/26/2019 0250   MCHC 33.1 05/26/2019 0250   RDW 13.0 05/26/2019 0250   LYMPHSABS 1.4 05/25/2019 0125   MONOABS 0.7 05/25/2019 0125   EOSABS 0.1 05/25/2019 0125   BASOSABS 0.0 05/25/2019 0125    BMET    Component Value Date/Time   NA 134 (L) 06/10/2019 1115   K 4.5 06/10/2019 1115   CL 100 06/10/2019 1115   CO2 27 06/10/2019 1115   GLUCOSE 145 (H) 06/10/2019 1115   GLUCOSE 126 (H) 04/23/2006 1043   BUN 43 (H) 06/10/2019 1115   CREATININE 1.45 (H) 06/10/2019 1115   CALCIUM 9.7 06/10/2019 1115   GFRNONAA 31 (L) 05/26/2019 0250   GFRAA 36 (L) 05/26/2019 0250    BNP    Component Value Date/Time   BNP 117.5 (H) 05/25/2019 0245    ProBNP No results found for: PROBNP  Imaging: DG Chest 2 View  Result Date: 05/25/2019 CLINICAL DATA:  Shortness of breath EXAM: CHEST - 2 VIEW COMPARISON:  March 03, 2014 FINDINGS: The heart size and mediastinal contours are within normal limits. Aortic knob calcifications. Both lungs are clear. No acute osseous abnormality. IMPRESSION: No active cardiopulmonary disease. Electronically Signed   By: Prudencio Pair M.D.   On: 05/25/2019 01:52   ECHOCARDIOGRAM COMPLETE  Result Date: 05/25/2019   ECHOCARDIOGRAM REPORT   Patient Name:   FATIHA GUZY Date of Exam: 05/25/2019 Medical Rec #:  [id number]           Height:       60.0 in Accession #:    [id number]          Weight:       140.8 lb Date of Birth:  28-May-1925            BSA:  1.61 m Patient Age:    79 years            BP:           151/48 mmHg Patient Gender: F                   HR:           64 bpm. Exam Location:  Inpatient Procedure: 2D Echo and Intracardiac Opacification Agent Indications:    Dyspnea 786.50/R06.00   History:        Patient has prior history of Echocardiogram examinations. Risk                 Factors:Diabetes, Hypertension and Dyslipidemia. CKD.  Sonographer:    Clayton Lefort RDCS (AE) Referring Phys: [place]  1. Left ventricular ejection fraction, by visual estimation, is 60 to 65%. The left ventricle has normal function. There is severely increased left ventricular hypertrophy.  2. Severe asymmetric hypertrophy measuring 52m in basal septum (112min posterior wall)  3. Left ventricular diastolic parameters are indeterminate.  4. Global right ventricle has normal systolic function.The right ventricular size is normal.  5. Moderate mitral annular calcification.  6. The mitral valve is abnormal. No evidence of mitral valve regurgitation.  7. The aortic valve is tricuspid. Aortic valve regurgitation is not visualized. Mild to moderate aortic valve sclerosis/calcification without any evidence of aortic stenosis.  8. The pulmonic valve was not well visualized. Pulmonic valve regurgitation is not visualized.  9. The tricuspid valve is normal in structure. 10. Left atrial size was normal. 11. Right atrial size was normal. 12. The inferior vena cava is normal in size with greater than 50% respiratory variability, suggesting right atrial pressure of 3 mmHg. 13. TR signal is inadequate for assessing pulmonary artery systolic pressure. FINDINGS  Left Ventricle: Left ventricular ejection fraction, by visual estimation, is 60 to 65%. The left ventricle has normal function. Definity contrast agent was given IV to delineate the left ventricular endocardial borders. The left ventricle has no regional wall motion abnormalities. There is severely increased left ventricular hypertrophy. Asymmetric left ventricular hypertrophy. Left ventricular diastolic parameters are indeterminate. Right Ventricle: The right ventricular size is normal. No increase in right ventricular wall thickness. Global RV systolic  function is has normal systolic function. The tricuspid regurgitant velocity is 1.12 m/s, and with an assumed right atrial pressure  of 8 mmHg, the estimated right ventricular systolic pressure is TR signal is inadequate for assessing PA pressure at 13.0 mmHg. Left Atrium: Left atrial size was normal in size. Right Atrium: Right atrial size was normal in size Pericardium: Trivial pericardial effusion is present. Mitral Valve: The mitral valve is abnormal. Moderate mitral annular calcification. No evidence of mitral valve regurgitation. MV peak gradient, 4.2 mmHg. Tricuspid Valve: The tricuspid valve is normal in structure. Tricuspid valve regurgitation is trivial. Aortic Valve: The aortic valve is tricuspid. Aortic valve regurgitation is not visualized. Mild to moderate aortic valve sclerosis/calcification is present, without any evidence of aortic stenosis. Aortic valve mean gradient measures 7.0 mmHg. Aortic valve peak gradient measures 12.4 mmHg. Aortic valve area, by VTI measures 1.80 cm. Pulmonic Valve: The pulmonic valve was not well visualized. Pulmonic valve regurgitation is not visualized. Pulmonic regurgitation is not visualized. Aorta: The aortic root is normal in size and structure. Venous: The inferior vena cava is normal in size with greater than 50% respiratory variability, suggesting right atrial pressure of 3 mmHg. IAS/Shunts: The interatrial septum was  not well visualized.  LEFT VENTRICLE PLAX 2D LVIDd:         3.21 cm  Diastology LVIDs:         2.26 cm  LV e' lateral:   5.68 cm/s LV PW:         1.51 cm  LV E/e' lateral: 15.4 LV IVS:        1.77 cm  LV e' medial:    4.22 cm/s LVOT diam:     1.80 cm  LV E/e' medial:  20.7 LV SV:         24 ml LV SV Index:   14.38 LVOT Area:     2.54 cm  RIGHT VENTRICLE             IVC RV Basal diam:  2.71 cm     IVC diam: 1.35 cm RV S prime:     11.60 cm/s TAPSE (M-mode): 2.4 cm LEFT ATRIUM             Index       RIGHT ATRIUM           Index LA diam:        3.10  cm 1.93 cm/m  RA Area:     13.90 cm LA Vol (A2C):   34.4 ml 21.39 ml/m RA Volume:   34.90 ml  21.70 ml/m LA Vol (A4C):   46.9 ml 29.17 ml/m LA Biplane Vol: 41.6 ml 25.87 ml/m  AORTIC VALVE AV Area (Vmax):    1.78 cm AV Area (Vmean):   1.87 cm AV Area (VTI):     1.80 cm AV Vmax:           176.00 cm/s AV Vmean:          125.000 cm/s AV VTI:            0.435 m AV Peak Grad:      12.4 mmHg AV Mean Grad:      7.0 mmHg LVOT Vmax:         123.00 cm/s LVOT Vmean:        92.100 cm/s LVOT VTI:          0.307 m LVOT/AV VTI ratio: 0.71  AORTA Ao Root diam: 2.70 cm MITRAL VALVE                        TRICUSPID VALVE MV Area (PHT): 2.05 cm             TR Peak grad:   5.0 mmHg MV Peak grad:  4.2 mmHg             TR Vmax:        112.00 cm/s MV Mean grad:  2.0 mmHg MV Vmax:       1.02 m/s             SHUNTS MV Vmean:      65.9 cm/s            Systemic VTI:  0.31 m MV VTI:        0.42 m               Systemic Diam: 1.80 cm MV PHT:        107.30 msec MV Decel Time: 370 msec MV E velocity: 87.30 cm/s 103 cm/s MV A velocity: 98.50 cm/s 70.3 cm/s MV E/A ratio:  0.89       1.5  Oswaldo Milian MD Electronically signed by Harrell Gave  Gardiner Rhyme MD Signature Date/Time: 05/25/2019/11:55:14 PM    Final    VAS [place] LOWER EXTREMITY VENOUS (DVT)  Result Date: 05/25/2019  Lower Venous Study Indications: Edema, and SOB.  Limitations: Body habitus. Performing Technologist: Antonieta Pert RDMS, RVT  Examination Guidelines: A complete evaluation includes B-mode imaging, spectral Doppler, color Doppler, and power Doppler as needed of all accessible portions of each vessel. Bilateral testing is considered an integral part of a complete examination. Limited examinations for reoccurring indications may be performed as noted.  +---------+---------------+---------+-----------+----------+--------------+ RIGHT    CompressibilityPhasicitySpontaneityPropertiesThrombus Aging  +---------+---------------+---------+-----------+----------+--------------+ CFV      Full           Yes      Yes                                 +---------+---------------+---------+-----------+----------+--------------+ SFJ      Full                                                        +---------+---------------+---------+-----------+----------+--------------+ FV Prox  Full                                                        +---------+---------------+---------+-----------+----------+--------------+ FV Mid   Full                                                        +---------+---------------+---------+-----------+----------+--------------+ FV DistalFull                                                        +---------+---------------+---------+-----------+----------+--------------+ PFV      Full                                                        +---------+---------------+---------+-----------+----------+--------------+ POP      Full           Yes      Yes                                 +---------+---------------+---------+-----------+----------+--------------+ PTV      Full                                                        +---------+---------------+---------+-----------+----------+--------------+ PERO     Full                                                        +---------+---------------+---------+-----------+----------+--------------+  GSV      Full                                                        +---------+---------------+---------+-----------+----------+--------------+   +---------+---------------+---------+-----------+----------+--------------+ LEFT     CompressibilityPhasicitySpontaneityPropertiesThrombus Aging +---------+---------------+---------+-----------+----------+--------------+ CFV      Full           Yes      Yes                                  +---------+---------------+---------+-----------+----------+--------------+ SFJ      Full                                                        +---------+---------------+---------+-----------+----------+--------------+ FV Prox  Full                                                        +---------+---------------+---------+-----------+----------+--------------+ FV Mid   Full                                                        +---------+---------------+---------+-----------+----------+--------------+ FV DistalFull                                                        +---------+---------------+---------+-----------+----------+--------------+ PFV      Full                                                        +---------+---------------+---------+-----------+----------+--------------+ POP      Full           Yes      Yes                                 +---------+---------------+---------+-----------+----------+--------------+ PTV      Full                                                        +---------+---------------+---------+-----------+----------+--------------+ PERO     Full                                                        +---------+---------------+---------+-----------+----------+--------------+  GSV      Full                                                        +---------+---------------+---------+-----------+----------+--------------+     Summary: Right: There is no evidence of deep vein thrombosis in the lower extremity. No cystic structure found in the popliteal fossa. Left: There is no evidence of deep vein thrombosis in the lower extremity. No cystic structure found in the popliteal fossa.  *See table(s) above for measurements and observations. Electronically signed by Harold Barban MD on 05/25/2019 at 6:44:10 PM.    Final       No flowsheet data found.  No results found for: NITRICOXIDE      Assessment & Plan:    Mild intermittent extrinsic asthma without complication Compensated without flare-patient has Symbicort to use if asthma symptoms start up. No change in therapy .   Essential hypertension Controlled on present regimen.  Now off of ARB due to renal function and hyperkalemia. Continue with good blood pressure control. Be met pending  2D echo showed severe LVH with preserved EF.  She has no evidence on exam of volume overload.  Patient is encouraged on a low-salt diet.  Continue with blood pressure control.  Discussed referral to cardiology however patient wants to hold off at this time.  Diabetes mellitus type 2 in obese [facility]) Currently under good control.  Continue on present regimen  Hyperlipidemia associated with type 2 diabetes mellitus ([place]) Patient has known hyperlipidemia is not at target goals however declines statin therapy.  Patient is continue with a low-fat low-cholesterol diet.  Chronic renal disease, stage III Chronic kidney disease serum creatinine has trended up over the last few years.  Patient has underlying hypertension and diabetes. Along with her age this seems to be slowly getting worse.  Long discussion with patient today.  Advised to avoid all nonsteroidals. Continue with good blood pressure control and diabetic control. At this point she is a stage III chronic kidney disease will refer to nephrology for evaluation and to establish.  Hyperkalemia Recent hospitalization with hyperkalemia.  ARB is discontinued.  Be met today shows resolution.  Anemia, normocytic normochromic Mild anemia.  No known bleeding.  We will continue to monitor.  Check CBC in 3 months.  Carotid bruit Left carotid bruit noted on exam. Check carotid Dopplers.   Total patient care time 60 minutes  Rexene Edison, NP 06/10/2019

## 2019-06-10 NOTE — Assessment & Plan Note (Signed)
Currently under good control.  Continue on present regimen

## 2019-06-10 NOTE — Patient Instructions (Addendum)
Refer to Nephrology for Chronic Kidney disease .  Call back if you decide on referral to Cardiology  Follow up with eye doctor tomorrow.  Set up for Carotid dopplers .  Low sweet diet and low cholesterol diet.  Follow up in 6 weeks with Dr. Melvyn Novas  And As needed   Please contact office for sooner follow up if symptoms do not improve or worsen or seek emergency care

## 2019-06-10 NOTE — Assessment & Plan Note (Signed)
Mild anemia.  No known bleeding.  We will continue to monitor.  Check CBC in 3 months.

## 2019-06-10 NOTE — Discharge Summary (Signed)
Physician Discharge Summary  Mikayla [name] [id number] DOB: 04-08-1926 DOA: 05/25/2019  PCP: Tanda Rockers, MD  Admit date: 05/25/2019 Discharge date: 05/26/2019  Time spent: 35 minutes  Recommendations for Outpatient Follow-up:  1. PCP in 1 week, please check Bmet at FU 2. ARB discontinued, norvasc dose increased    Discharge Diagnoses:  Principal Problem:   Dyspnea Active Problems:   Diabetes mellitus type 2 in obese (HCC)   Hyperlipidemia associated with type 2 diabetes mellitus ([place])   Essential hypertension   Hypothyroidism, acquired   Chronic renal disease, stage III   Hyperkalemia   AKI (acute kidney injury) [facility])   Discharge Condition: stable  Diet recommendation: low sodium  Filed Weights   05/25/19 2109  Weight: 63.2 kg    History of present illness:  86 year old female with history of asthma, CKD stage III, type 2 diabetes mellitus, hypertension, dyslipidemia presented to the emergency room with acute onset dyspnea overnight. -She denies any cough congestion wheezing or chest tightness, denies fever or chills, denies lower extremity edema orthopnea or PND, denies chest pain -Work-up in the ED noted mild worsening creatinine to 1.7, potassium of  6.0, high-sensitivity troponin was 6 x2, D-dimer was 1.1, chest x-ray was clear  Hospital Course:   AKI/Hyperkalemia, on CKD 3 -likely due to ARB/lasix use -ARB stopped, treated with calcium gluconate, Lokelma -resolved at discharge -Clinically appears euvolemic at this time  Subacute dyspnea -Etiology is unclear at this time, appears to have improved -EKG without considerable changes and high-sensitivity troponin negative x2 -Does not have hypoxia at this time, chest x-ray is clear, lungs are clear, no edema -Covid PCR is negative, follow-up echo -D-dimer is mildly elevated, but no symptoms of PE, dopplers negative, ECHO noted LVH, EF 60-65%, asymmetrical hypertrophy of the basal septum, no significant  valvular disease, diastolic parameters were indeterminate. -Patient ambulated down the halls with physical therapy, her O2 sats remained in the mid 90s without subjective sensation of dyspnea -Low-dose Lasix resumed -Asthma felt to be stable, no wheezing noted -Discharged home in a stable condition, follow-up with PCP pulmonary in 2 weeks  Asthma -Appears stable, no wheezing at this time -Continue bronchodilators as needed  Type 2 diabetes mellitus -resumed Metformin, patient declines sliding scale insulin  Hypertension -Stable, continue bisoprolol, amlodipine     Discharge Exam: Vitals:   05/26/19 0742 05/26/19 0751  BP:  (!) 171/49  Pulse:  62  Resp:  18  Temp:  97.8 F (36.6 C)  SpO2: 99% 97%    General: AAOx3 Cardiovascular: S1S2/RRR Respiratory: CTAB  Discharge Instructions   Discharge Instructions    Diet - low sodium heart healthy   Complete by: As directed    Increase activity slowly   Complete by: As directed      Allergies as of 05/26/2019      Reactions   Amoxicillin Rash   Lipitor [atorvastatin]    REACTION: aches      Medication List    STOP taking these medications   amLODipine 5 MG tablet Commonly known as: NORVASC   bisoprolol 5 MG tablet Commonly known as: ZEBETA   irbesartan 150 MG tablet Commonly known as: AVAPRO     TAKE these medications   acetaminophen 325 MG tablet Commonly known as: TYLENOL Take 325 mg by mouth every 6 (six) hours as needed for mild pain or fever.   aspirin 81 MG tablet Take 81 mg by mouth daily.   budesonide-formoterol 80-4.5 MCG/ACT inhaler Commonly known as: SYMBICORT  Inhale 2 puffs into the lungs 2 (two) times daily as needed (shortness of breath/wheezing).   calcium carbonate 750 MG chewable tablet Commonly known as: TUMS EX Chew 1 tablet by mouth 3 (three) times daily with meals.   ICAPS PO Take 1 tablet by mouth daily.   CENTRUM SILVER PO Take 1 tablet by mouth daily.   [employer] LEG  CRAMPS PAIN RELIEF PO Take 1 tablet by mouth as needed (for leg cramps).   cyclobenzaprine 10 MG tablet Commonly known as: FLEXERIL Take 1 tablet (10 mg total) by mouth 3 (three) times daily as needed for muscle spasms.   dextromethorphan 30 MG/5ML liquid Commonly known as: DELSYM Take 60 mg by mouth every 12 (twelve) hours as needed for cough.   dorzolamide-timolol 22.3-6.8 MG/ML ophthalmic solution Commonly known as: COSOPT Place 1 drop into both eyes 2 (two) times daily.   famotidine 20 MG tablet Commonly known as: PEPCID Take 20 mg by mouth at bedtime as needed for heartburn (COUGH FLARE).   furosemide 20 MG tablet Commonly known as: LASIX TAKE 1 TABLET BY MOUTH DAILY.   levothyroxine 50 MCG tablet Commonly known as: Synthroid Take 1 tablet (50 mcg total) by mouth daily before breakfast.   loratadine 10 MG tablet Commonly known as: CLARITIN Take 10 mg by mouth daily as needed (DRIPPY NOSE/WATERY EYE/INTIATIVE).   omeprazole 40 MG capsule Commonly known as: PRILOSEC TAKE 1 CAPSULE BY MOUTH DAILY.   simvastatin 20 MG tablet Commonly known as: ZOCOR Take 1 tablet (20 mg total) by mouth at bedtime.   Vitamin D 1000 units capsule Take 1,000 Units by mouth daily.      Allergies  Allergen Reactions  . Amoxicillin Rash  . Lipitor [Atorvastatin]     REACTION: aches   Follow-up Information    Tanda Rockers, MD. Schedule an appointment as soon as possible for a visit in 1 week(s).   Specialty: Pulmonary Disease Contact information: [place] [address] [phone]            The results of significant diagnostics from this hospitalization (including imaging, microbiology, ancillary and laboratory) are listed below for reference.    Significant Diagnostic Studies: DG Chest 2 View  Result Date: 05/25/2019 CLINICAL DATA:  Shortness of breath EXAM: CHEST - 2 VIEW COMPARISON:  March 03, 2014 FINDINGS: The heart size and  mediastinal contours are within normal limits. Aortic knob calcifications. Both lungs are clear. No acute osseous abnormality. IMPRESSION: No active cardiopulmonary disease. Electronically Signed   By: Prudencio Pair M.D.   On: 05/25/2019 01:52   ECHOCARDIOGRAM COMPLETE  Result Date: 05/25/2019   ECHOCARDIOGRAM REPORT   Patient Name:   Mikayla [name] Date of Exam: 05/25/2019 Medical Rec #:  [id number]           Height:       60.0 in Accession #:    [id number]          Weight:       140.8 lb Date of Birth:  [birth year]            BSA:          1.61 m Patient Age:    77 years            BP:           151/48 mmHg Patient Gender: F                   HR:  64 bpm. Exam Location:  Inpatient Procedure: 2D Echo and Intracardiac Opacification Agent Indications:    Dyspnea 786.50/R06.00  History:        Patient has prior history of Echocardiogram examinations. Risk                 Factors:Diabetes, Hypertension and Dyslipidemia. CKD.  Sonographer:    Clayton Lefort RDCS (AE) Referring Phys: [place]  1. Left ventricular ejection fraction, by visual estimation, is 60 to 65%. The left ventricle has normal function. There is severely increased left ventricular hypertrophy.  2. Severe asymmetric hypertrophy measuring 69mm in basal septum (11mm in posterior wall)  3. Left ventricular diastolic parameters are indeterminate.  4. Global right ventricle has normal systolic function.The right ventricular size is normal.  5. Moderate mitral annular calcification.  6. The mitral valve is abnormal. No evidence of mitral valve regurgitation.  7. The aortic valve is tricuspid. Aortic valve regurgitation is not visualized. Mild to moderate aortic valve sclerosis/calcification without any evidence of aortic stenosis.  8. The pulmonic valve was not well visualized. Pulmonic valve regurgitation is not visualized.  9. The tricuspid valve is normal in structure. 10. Left atrial size was normal. 11. Right atrial size  was normal. 12. The inferior vena cava is normal in size with greater than 50% respiratory variability, suggesting right atrial pressure of 3 mmHg. 13. TR signal is inadequate for assessing pulmonary artery systolic pressure. FINDINGS  Left Ventricle: Left ventricular ejection fraction, by visual estimation, is 60 to 65%. The left ventricle has normal function. Definity contrast agent was given IV to delineate the left ventricular endocardial borders. The left ventricle has no regional wall motion abnormalities. There is severely increased left ventricular hypertrophy. Asymmetric left ventricular hypertrophy. Left ventricular diastolic parameters are indeterminate. Right Ventricle: The right ventricular size is normal. No increase in right ventricular wall thickness. Global RV systolic function is has normal systolic function. The tricuspid regurgitant velocity is 1.12 m/s, and with an assumed right atrial pressure  of 8 mmHg, the estimated right ventricular systolic pressure is TR signal is inadequate for assessing PA pressure at 13.0 mmHg. Left Atrium: Left atrial size was normal in size. Right Atrium: Right atrial size was normal in size Pericardium: Trivial pericardial effusion is present. Mitral Valve: The mitral valve is abnormal. Moderate mitral annular calcification. No evidence of mitral valve regurgitation. MV peak gradient, 4.2 mmHg. Tricuspid Valve: The tricuspid valve is normal in structure. Tricuspid valve regurgitation is trivial. Aortic Valve: The aortic valve is tricuspid. Aortic valve regurgitation is not visualized. Mild to moderate aortic valve sclerosis/calcification is present, without any evidence of aortic stenosis. Aortic valve mean gradient measures 7.0 mmHg. Aortic valve peak gradient measures 12.4 mmHg. Aortic valve area, by VTI measures 1.80 cm. Pulmonic Valve: The pulmonic valve was not well visualized. Pulmonic valve regurgitation is not visualized. Pulmonic regurgitation is not  visualized. Aorta: The aortic root is normal in size and structure. Venous: The inferior vena cava is normal in size with greater than 50% respiratory variability, suggesting right atrial pressure of 3 mmHg. IAS/Shunts: The interatrial septum was not well visualized.  LEFT VENTRICLE PLAX 2D LVIDd:         3.21 cm  Diastology LVIDs:         2.26 cm  LV e' lateral:   5.68 cm/s LV PW:         1.51 cm  LV E/e' lateral: 15.4 LV IVS:  1.77 cm  LV e' medial:    4.22 cm/s LVOT diam:     1.80 cm  LV E/e' medial:  20.7 LV SV:         24 ml LV SV Index:   14.38 LVOT Area:     2.54 cm  RIGHT VENTRICLE             IVC RV Basal diam:  2.71 cm     IVC diam: 1.35 cm RV S prime:     11.60 cm/s TAPSE (M-mode): 2.4 cm LEFT ATRIUM             Index       RIGHT ATRIUM           Index LA diam:        3.10 cm 1.93 cm/m  RA Area:     13.90 cm LA Vol (A2C):   34.4 ml 21.39 ml/m RA Volume:   34.90 ml  21.70 ml/m LA Vol (A4C):   46.9 ml 29.17 ml/m LA Biplane Vol: 41.6 ml 25.87 ml/m  AORTIC VALVE AV Area (Vmax):    1.78 cm AV Area (Vmean):   1.87 cm AV Area (VTI):     1.80 cm AV Vmax:           176.00 cm/s AV Vmean:          125.000 cm/s AV VTI:            0.435 m AV Peak Grad:      12.4 mmHg AV Mean Grad:      7.0 mmHg LVOT Vmax:         123.00 cm/s LVOT Vmean:        92.100 cm/s LVOT VTI:          0.307 m LVOT/AV VTI ratio: 0.71  AORTA Ao Root diam: 2.70 cm MITRAL VALVE                        TRICUSPID VALVE MV Area (PHT): 2.05 cm             TR Peak grad:   5.0 mmHg MV Peak grad:  4.2 mmHg             TR Vmax:        112.00 cm/s MV Mean grad:  2.0 mmHg MV Vmax:       1.02 m/s             SHUNTS MV Vmean:      65.9 cm/s            Systemic VTI:  0.31 m MV VTI:        0.42 m               Systemic Diam: 1.80 cm MV PHT:        107.30 msec MV Decel Time: 370 msec MV E velocity: 87.30 cm/s 103 cm/s MV A velocity: 98.50 cm/s 70.3 cm/s MV E/A ratio:  0.89       1.5  Oswaldo Milian MD Electronically signed by Oswaldo Milian MD Signature Date/Time: 05/25/2019/11:55:14 PM    Final    VAS [place] LOWER EXTREMITY VENOUS (DVT)  Result Date: 05/25/2019  Lower Venous Study Indications: Edema, and SOB.  Limitations: Body habitus. Performing Technologist: Antonieta Pert RDMS, RVT  Examination Guidelines: A complete evaluation includes B-mode imaging, spectral Doppler, color Doppler, and power Doppler as needed of all accessible portions of each vessel.  Bilateral testing is considered an integral part of a complete examination. Limited examinations for reoccurring indications may be performed as noted.  +---------+---------------+---------+-----------+----------+--------------+ RIGHT    CompressibilityPhasicitySpontaneityPropertiesThrombus Aging +---------+---------------+---------+-----------+----------+--------------+ CFV      Full           Yes      Yes                                 +---------+---------------+---------+-----------+----------+--------------+ SFJ      Full                                                        +---------+---------------+---------+-----------+----------+--------------+ FV Prox  Full                                                        +---------+---------------+---------+-----------+----------+--------------+ FV Mid   Full                                                        +---------+---------------+---------+-----------+----------+--------------+ FV DistalFull                                                        +---------+---------------+---------+-----------+----------+--------------+ PFV      Full                                                        +---------+---------------+---------+-----------+----------+--------------+ POP      Full           Yes      Yes                                 +---------+---------------+---------+-----------+----------+--------------+ PTV      Full                                                         +---------+---------------+---------+-----------+----------+--------------+ PERO     Full                                                        +---------+---------------+---------+-----------+----------+--------------+ GSV      Full                                                        +---------+---------------+---------+-----------+----------+--------------+   +---------+---------------+---------+-----------+----------+--------------+  LEFT     CompressibilityPhasicitySpontaneityPropertiesThrombus Aging +---------+---------------+---------+-----------+----------+--------------+ CFV      Full           Yes      Yes                                 +---------+---------------+---------+-----------+----------+--------------+ SFJ      Full                                                        +---------+---------------+---------+-----------+----------+--------------+ FV Prox  Full                                                        +---------+---------------+---------+-----------+----------+--------------+ FV Mid   Full                                                        +---------+---------------+---------+-----------+----------+--------------+ FV DistalFull                                                        +---------+---------------+---------+-----------+----------+--------------+ PFV      Full                                                        +---------+---------------+---------+-----------+----------+--------------+ POP      Full           Yes      Yes                                 +---------+---------------+---------+-----------+----------+--------------+ PTV      Full                                                        +---------+---------------+---------+-----------+----------+--------------+ PERO     Full                                                         +---------+---------------+---------+-----------+----------+--------------+ GSV      Full                                                        +---------+---------------+---------+-----------+----------+--------------+  Summary: Right: There is no evidence of deep vein thrombosis in the lower extremity. No cystic structure found in the popliteal fossa. Left: There is no evidence of deep vein thrombosis in the lower extremity. No cystic structure found in the popliteal fossa.  *See table(s) above for measurements and observations. Electronically signed by Harold Barban MD on 05/25/2019 at 6:44:10 PM.    Final     Microbiology: No results found for this or any previous visit (from the past 240 hour(s)).   Labs: Basic Metabolic Panel: Recent Labs  Lab 06/10/19 1115  NA 134*  K 4.5  CL 100  CO2 27  GLUCOSE 145*  BUN 43*  CREATININE 1.45*  CALCIUM 9.7   Liver Function Tests: No results for input(s): AST, ALT, ALKPHOS, BILITOT, PROT, ALBUMIN in the last 168 hours. No results for input(s): LIPASE, AMYLASE in the last 168 hours. No results for input(s): AMMONIA in the last 168 hours. CBC: No results for input(s): WBC, NEUTROABS, HGB, HCT, MCV, PLT in the last 168 hours. Cardiac Enzymes: No results for input(s): CKTOTAL, CKMB, CKMBINDEX, TROPONINI in the last 168 hours. BNP: BNP (last 3 results) Recent Labs    05/25/19 0245  BNP 117.5*    ProBNP (last 3 results) No results for input(s): PROBNP in the last 8760 hours.  CBG: No results for input(s): GLUCAP in the last 168 hours.     Signed:  Domenic Polite MD.  [facility] 06/10/2019, 2:39 PM

## 2019-06-10 NOTE — Assessment & Plan Note (Signed)
Chronic kidney disease serum creatinine has trended up over the last few years.  Patient has underlying hypertension and diabetes. Along with her age this seems to be slowly getting worse.  Long discussion with patient today.  Advised to avoid all nonsteroidals. Continue with good blood pressure control and diabetic control. At this point she is a stage III chronic kidney disease will refer to nephrology for evaluation and to establish.

## 2019-06-10 NOTE — Assessment & Plan Note (Signed)
Patient has known hyperlipidemia is not at target goals however declines statin therapy.  Patient is continue with a low-fat low-cholesterol diet.

## 2019-06-10 NOTE — Assessment & Plan Note (Signed)
Left carotid bruit noted on exam. Check carotid Dopplers.

## 2019-06-11 DIAGNOSIS — H2000 Unspecified acute and subacute iridocyclitis: Secondary | ICD-10-CM | POA: Diagnosis not present

## 2019-06-12 ENCOUNTER — Telehealth: Payer: Self-pay | Admitting: Adult Health

## 2019-06-12 ENCOUNTER — Ambulatory Visit: Payer: Medicare Other

## 2019-06-12 NOTE — Telephone Encounter (Signed)
Kidney function is stable but remains low functioning . Needs to keep Nephrology referral as may take several weeks to be seen .   Potassium is normal .   Cont w/ ov recs Please contact office for sooner follow up if symptoms do not improve or worsen or seek emergency care   Spoke with pt and notified of results per Rexene Edison, NP Pt verbalized understanding and denied any questions.

## 2019-06-18 ENCOUNTER — Ambulatory Visit (HOSPITAL_COMMUNITY)
Admission: RE | Admit: 2019-06-18 | Discharge: 2019-06-18 | Disposition: A | Discharge status: Home / Self Care | Payer: Medicare Other | Source: Ambulatory Visit | Attending: Cardiology | Admitting: Cardiology

## 2019-06-18 ENCOUNTER — Other Ambulatory Visit: Payer: Self-pay

## 2019-06-18 DIAGNOSIS — R0989 Other specified symptoms and signs involving the circulatory and respiratory systems: Secondary | ICD-10-CM | POA: Insufficient documentation

## 2019-06-19 ENCOUNTER — Encounter: Payer: Self-pay | Admitting: Adult Health

## 2019-06-19 ENCOUNTER — Ambulatory Visit: Payer: Medicare Other | Attending: Internal Medicine

## 2019-06-19 ENCOUNTER — Telehealth: Payer: Self-pay | Admitting: Internal Medicine

## 2019-06-19 DIAGNOSIS — Z23 Encounter for immunization: Secondary | ICD-10-CM | POA: Insufficient documentation

## 2019-06-19 DIAGNOSIS — I6522 Occlusion and stenosis of left carotid artery: Secondary | ICD-10-CM | POA: Insufficient documentation

## 2019-06-19 NOTE — Telephone Encounter (Signed)
I am not seeing anywhere where someone from our office tried to call pt. Attempted to call pt but unable to reach. Left message for pt to return call.

## 2019-06-19 NOTE — Progress Notes (Signed)
   Covid-19 Vaccination Clinic  Name:  DWAYNA KEENE    MRN: [id number] DOB: 1925/06/25  06/19/2019  Ms. Orser was observed post Covid-19 immunization for 15 minutes without incidence. She was provided with Vaccine Information Sheet and instruction to access the V-Safe system.   Ms. Barrero was instructed to call 911 with any severe reactions post vaccine: Marland Kitchen Difficulty breathing  . Swelling of your face and throat  . A fast heartbeat  . A bad rash all over your body  . Dizziness and weakness    Immunizations Administered    Name Date Dose VIS Date Route   Pfizer COVID-19 Vaccine 06/19/2019  9:30 AM 0.3 mL 04/25/2019 Intramuscular   Manufacturer: [place]   Lot: [id number]   [place]: [id number]

## 2019-06-20 DIAGNOSIS — H2000 Unspecified acute and subacute iridocyclitis: Secondary | ICD-10-CM | POA: Diagnosis not present

## 2019-06-20 NOTE — Telephone Encounter (Signed)
I didn't get to this message until 9:45, I dont see any record of a call.

## 2019-06-20 NOTE — Telephone Encounter (Signed)
Spoke with pt, states that she wants to think about the referral at this time and has asked me not to place the referral just yet.  States she will call back for the referral if she decides to proceed with it. Nothing further needed at this time- will close encounter.

## 2019-06-20 NOTE — Telephone Encounter (Signed)
Results from TP: Carotid Dopplers show a narrowing/stenosis in the left carotid artery. We will refer you to a vascular surgeon to evaluate this and see if additional studies are indicated You do have some narrowing on the right carotid but is mild. -----------  Called pt, line was picked up and then hung up.  Called a second time and the same thing happened.  Wcb.

## 2019-06-20 NOTE — Telephone Encounter (Signed)
Pt calling because she received a phone call from T.Parrett nurse yesterday evening. Pt stated that T. Parrett ordered an ultrasound on 2.3 and she thinks someone was calling back with the results from that. Pt can be reached at [phone].

## 2019-06-20 NOTE — Telephone Encounter (Signed)
Message was not marked urgent, so triage had no way of knowing this was time- sensitive until after the requested callback time from patient. I do not see where we called patient. lmtcb for pt to call back.

## 2019-06-20 NOTE — Telephone Encounter (Signed)
Pt is calling back - states that Mikayla [name] called yesterday and left a message for her to call - Pt states that she needs someone to call her back before 9:45 this morning as she has another appt - she would like to find out why someone called her - states it may be regarding her ultrasound that she had?

## 2019-07-14 ENCOUNTER — Ambulatory Visit: Payer: Medicare Other | Attending: Internal Medicine

## 2019-07-14 DIAGNOSIS — Z23 Encounter for immunization: Secondary | ICD-10-CM | POA: Insufficient documentation

## 2019-07-14 NOTE — Progress Notes (Signed)
   Covid-19 Vaccination Clinic  Name:  SHERRONDA WADDELL    MRN: [id number] DOB: Jul 04, 1925  07/14/2019  Ms. Cochell was observed post Covid-19 immunization for 15 minutes without incidence. She was provided with Vaccine Information Sheet and instruction to access the V-Safe system.   Ms. Poplar was instructed to call 911 with any severe reactions post vaccine: Marland Kitchen Difficulty breathing  . Swelling of your face and throat  . A fast heartbeat  . A bad rash all over your body  . Dizziness and weakness    Immunizations Administered    Name Date Dose VIS Date Route   Pfizer COVID-19 Vaccine 07/14/2019  2:16 PM 0.3 mL 04/25/2019 Intramuscular   Manufacturer: [place]   Lot: [id number]   [employer]: [id number]

## 2019-07-22 ENCOUNTER — Other Ambulatory Visit: Payer: Self-pay

## 2019-07-22 ENCOUNTER — Ambulatory Visit (INDEPENDENT_AMBULATORY_CARE_PROVIDER_SITE_OTHER): Payer: Medicare Other | Admitting: Internal Medicine

## 2019-07-22 ENCOUNTER — Encounter: Payer: Self-pay | Admitting: Internal Medicine

## 2019-07-22 DIAGNOSIS — I10 Essential (primary) hypertension: Secondary | ICD-10-CM | POA: Diagnosis not present

## 2019-07-22 DIAGNOSIS — E1169 Type 2 diabetes mellitus with other specified complication: Secondary | ICD-10-CM

## 2019-07-22 DIAGNOSIS — D649 Anemia, unspecified: Secondary | ICD-10-CM | POA: Diagnosis not present

## 2019-07-22 DIAGNOSIS — N1832 Chronic kidney disease, stage 3b: Secondary | ICD-10-CM

## 2019-07-22 DIAGNOSIS — E669 Obesity, unspecified: Secondary | ICD-10-CM

## 2019-07-22 DIAGNOSIS — R0600 Dyspnea, unspecified: Secondary | ICD-10-CM | POA: Diagnosis not present

## 2019-07-22 DIAGNOSIS — I6522 Occlusion and stenosis of left carotid artery: Secondary | ICD-10-CM | POA: Diagnosis not present

## 2019-07-22 DIAGNOSIS — J4520 Mild intermittent asthma, uncomplicated: Secondary | ICD-10-CM | POA: Diagnosis not present

## 2019-07-22 DIAGNOSIS — R0609 Other forms of dyspnea: Secondary | ICD-10-CM

## 2019-07-22 LAB — BASIC METABOLIC PANEL
BUN: 36 mg/dL — ABNORMAL HIGH (ref 6–23)
CO2: 28 mEq/L (ref 19–32)
Calcium: 9.5 mg/dL (ref 8.4–10.5)
Chloride: 100 mEq/L (ref 96–112)
Creatinine, Ser: 1.45 mg/dL — ABNORMAL HIGH (ref 0.40–1.20)
GFR: 33.77 mL/min — ABNORMAL LOW (ref >60.00)
Glucose, Bld: 144 mg/dL — ABNORMAL HIGH (ref 70–99)
Potassium: 4.3 mEq/L (ref 3.5–5.1)
Sodium: 136 mEq/L (ref 135–145)

## 2019-07-22 LAB — CBC WITH DIFFERENTIAL/PLATELET
Basophils Absolute: 0 10*3/uL (ref 0.0–0.1)
Basophils Relative: 0.2 % (ref 0.0–3.0)
Eosinophils Absolute: 0.2 10*3/uL (ref 0.0–0.7)
Eosinophils Relative: 2.2 % (ref 0.0–5.0)
HCT: 31.6 % — ABNORMAL LOW (ref 36.0–46.0)
Hemoglobin: 10.6 g/dL — ABNORMAL LOW (ref 12.0–15.0)
Lymphocytes Relative: 21.9 % (ref 12.0–46.0)
Lymphs Abs: 1.7 10*3/uL (ref 0.7–4.0)
MCHC: 33.5 g/dL (ref 30.0–36.0)
MCV: 88.6 fl (ref 78.0–100.0)
Monocytes Absolute: 0.5 10*3/uL (ref 0.1–1.0)
Monocytes Relative: 6.1 % (ref 3.0–12.0)
Neutro Abs: 5.3 10*3/uL (ref 1.4–7.7)
Neutrophils Relative %: 69.6 % (ref 43.0–77.0)
Platelets: 198 10*3/uL (ref 150.0–400.0)
RBC: 3.57 Mil/uL — ABNORMAL LOW (ref 3.87–5.11)
RDW: 13.5 % (ref 11.5–15.5)
WBC: 7.7 10*3/uL (ref 4.0–10.5)

## 2019-07-22 LAB — BRAIN NATRIURETIC PEPTIDE: Pro B Natriuretic peptide (BNP): 234 pg/mL — ABNORMAL HIGH (ref 0.0–100.0)

## 2019-07-22 NOTE — Patient Instructions (Addendum)
See calendar for specific medication instructions and bring it back for each and every office visit for every healthcare provider you see.  Without it,  you may not receive the best quality medical care that we feel you deserve.  You will note that the calendar groups together  your maintenance  medications that are timed at particular times of the day.  Think of this as your checklist for what your doctor has instructed you to do until your next evaluation to see what benefit  there is  to staying on a consistent group of medications intended to keep you well.  The other group at the bottom is entirely up to you to use as you see fit  for specific symptoms that may arise between visits that require you to treat them on an as needed basis.  Think of this as your action plan or "what if" list.   Separating the top medications from the bottom group is fundamental to providing you adequate care going forward.     Please remember to go to the lab department   for your tests - we will call you with the results when they are available.   Please schedule a follow up visit in 3 months but call sooner if needed

## 2019-07-22 NOTE — Progress Notes (Signed)
Subjective:     Patient ID: Mikayla [name], female   DOB: [birth year]     MRN: [id number]  Brief patient profile:  54  yowf never smoker with moderate obesity> adult onset diabetes dx 2002 and hyperlipidemia and hbp/ asthma       History of Present Illness  12/18/2017  f/u ov/[facility] re:  HBP/ hyperlipidemia/ / dm  Chief Complaint  Patient presents with  . Follow-up   Dyspnea:  Not limited by breathing from desired activities  But more sluggish on bid bisoprolol Cough: none  Sleeping: fine flat Not checking bp or CBG's between ov's and not interested at all in doing so > no overt symptoms of high or low sugars  rec You need your drug formulary from insurance when you return  Change bisorpolol to 5 mg one daily  Add amlopdine 5 mg one daily   Admit date: 05/25/2019 Discharge date: 05/26/2019  Recommendations for Outpatient Follow-up:  1. PCP in 1 week, please check Bmet at FU 2. ARB discontinued, norvasc dose increased    Discharge Diagnoses:  Principal Problem:   Dyspnea    Diabetes mellitus type 2 in obese (HCC)   Hyperlipidemia associated with type 2 diabetes mellitus ([place])   Essential hypertension   Hypothyroidism, acquired   Chronic renal disease, stage III   Hyperkalemia   AKI (acute kidney injury) ([place])   History of present illness:  86 year old female with history of asthma, CKD stage III, type 2 diabetes mellitus, hypertension, dyslipidemia presented to the emergency room with acute onset dyspnea overnight.  -Work-up in the ED noted mild worsening creatinine to 1.7, potassium of 6.0, high-sensitivity troponin was 6 x2, D-dimer was 1.1, chest x-ray was clear  Hospital Course:   AKI/Hyperkalemia,on CKD 3 -likely due to ARB/lasix use -ARB stopped, treated with calcium gluconate, Lokelma -resolved at discharge -Clinically appears euvolemic    Subacute dyspnea -Etiology is unclear at this time, appears to have improved -EKG without considerable  changes and high-sensitivity troponin negative x2 -Does not have hypoxia at this time, chest x-ray is clear,lungs are clear, no edema -Covid PCR is negative,follow-up echo -D-dimer is mildly elevated, but no symptoms of PE, dopplers negative, ECHO noted LVH, EF 60-65%, asymmetrical hypertrophy of the basal septum, no significant valvular disease, diastolic parameters were indeterminate. -Patient ambulated down the halls with physical therapy, her O2 sats remained in the mid 90s without subjective sensation of dyspnea -Low-dose Lasix resumed -Asthma felt to be stable, no wheezing noted    Type 2 diabetes mellitus -resumed Metformin, patient declines sliding scale insulin  Hypertension -Stable, continue bisoprolol, amlodipine          07/22/2019  Post hosp f/u  ov/Deandra Goering re: got both vaccines for covid 19 Chief Complaint  Patient presents with  . Follow-up    Doing well and denies any co's today. She is scheduled to see Nephrology 08/12/19.   Dyspnea:  Not limited by breathing from desired activities   Cough: none  Sleeping: bed is flat / r side/ 2 pillows  SABA use: none 02: none   No obvious day to day or daytime variability or assoc excess/ purulent sputum or mucus plugs or hemoptysis or cp or chest tightness, subjective wheeze or overt sinus or hb symptoms.   sleeping without nocturnal  or early am exacerbation  of respiratory  c/o's or need for noct saba. Also denies any obvious fluctuation of symptoms with weather or environmental changes or other aggravating or alleviating factors except as  outlined above   No unusual exposure hx or h/o childhood pna/ asthma or knowledge of premature birth.  Current Allergies, Complete Past Medical History, Past Surgical History, Family History, and Social History were reviewed in [employer] record.  ROS  The following are not active complaints unless bolded Hoarseness, sore throat, dysphagia, dental problems,  itching, sneezing,  nasal congestion or discharge of excess mucus or purulent secretions, ear ache,   fever, chills, sweats, unintended wt loss or wt gain, classically pleuritic or exertional cp,  orthopnea pnd or arm/hand swelling  or leg swelling, presyncope, palpitations, abdominal pain, anorexia, nausea, vomiting, diarrhea  or change in bowel habits or change in bladder habits, change in stools or change in urine, dysuria, hematuria,  rash, arthralgias, visual complaints, headache, numbness, weakness or ataxia or problems with walking or coordination,  change in mood= anxious or  memory.        Current Meds  Medication Sig  . acetaminophen (TYLENOL) 325 MG tablet Take 325 mg by mouth every 6 (six) hours as needed for mild pain or fever.   Marland Kitchen amLODipine (NORVASC) 10 MG tablet Take 1 tablet (10 mg total) by mouth daily.  Marland Kitchen aspirin 81 MG tablet Take 81 mg by mouth daily.   . bisoprolol (ZEBETA) 5 MG tablet Take 1 tablet (5 mg total) by mouth 2 (two) times daily.  . budesonide-formoterol (SYMBICORT) 80-4.5 MCG/ACT inhaler Inhale 2 puffs into the lungs 2 (two) times daily as needed (shortness of breath/wheezing).  . calcium carbonate (TUMS EX) 750 MG chewable tablet Chew 1 tablet by mouth 3 (three) times daily with meals.   . Cholecalciferol (VITAMIN D) 1000 UNITS capsule Take 1,000 Units by mouth daily.    . cyclobenzaprine (FLEXERIL) 10 MG tablet Take 1 tablet (10 mg total) by mouth 3 (three) times daily as needed for muscle spasms.  Marland Kitchen dextromethorphan (DELSYM) 30 MG/5ML liquid Take 60 mg by mouth every 12 (twelve) hours as needed for cough.   . dorzolamide-timolol (COSOPT) 22.3-6.8 MG/ML ophthalmic solution Place 1 drop into both eyes 2 (two) times daily.   . famotidine (PEPCID) 20 MG tablet Take 20 mg by mouth at bedtime as needed for heartburn (COUGH FLARE).   . furosemide (LASIX) 20 MG tablet TAKE 1 TABLET BY MOUTH DAILY. (Patient taking differently: Take 20 mg by mouth daily. )  . Homeopathic  Products ([employer] LEG CRAMPS PAIN RELIEF PO) Take 1 tablet by mouth as needed (for leg cramps).   Marland Kitchen levothyroxine (SYNTHROID) 50 MCG tablet Take 1 tablet (50 mcg total) by mouth daily before breakfast.  . loratadine (CLARITIN) 10 MG tablet Take 10 mg by mouth daily as needed (DRIPPY NOSE/WATERY EYE/INTIATIVE).   Marland Kitchen metFORMIN (GLUCOPHAGE) 500 MG tablet Take 1 tablet (500 mg total) by mouth 2 (two) times daily with a meal.  . Multiple Vitamins-Minerals (CENTRUM SILVER PO) Take 1 tablet by mouth daily.    . Multiple Vitamins-Minerals (ICAPS PO) Take 1 tablet by mouth daily.   Marland Kitchen omeprazole (PRILOSEC) 40 MG capsule TAKE 1 CAPSULE BY MOUTH DAILY. (Patient taking differently: Take 40 mg by mouth daily. )  . simvastatin (ZOCOR) 20 MG tablet Take 1 tablet (20 mg total) by mouth at bedtime.                                 Past Medical History:  ESSENTIAL HYPERTENSION, BENIGN (ICD-401.1)  Glaucoma................................................................................[name]/[name]  HYPERLIPIDEMIA (ICD-272.4)  -  target LDL less than 70 because of diabetes  -Zocor 20mg  every other day at bedtime April 14, 2010>>> LDL 110 July 06, 2010 and no recurrent  cramps  RHINITIS (ICD-472.0)  Intermittent asthma  - exac by Timolol but tolerates unless uri/flare  OSTEOPENIA (ICD-733.90)  - DEXA 12/28/07 PA Spine .655, L Hip -1.033, Lfem Neck -1.793  - DEXA 02/23/10 1.7 L Fem -1.9 Right Fem -1.8  OBESITY (ICD-278.00)  - Target wt = 148 for BMI < 30  AODM (ICD-250.00)  Carotid stenosis   By u/s 06/18/19 x 60-79% on L , R with 1-39%  Health Maintenance.........................................................................Marland KitchenWert  - Td  12/05/16   - Pneumovax 04/2013 /  prevnar 03/03/14 - CPX 03/03/2014  - GYN Health Maint : [name], declines mammography and colonoscopy  --med calendar 06/04/2012 , 08/28/2013 , 04/20/14 , 11/23/2015        Objective:   Physical Exam     amb wf nad      07/22/2019    140  05/20/2019   140  wt 159 November 05, 2008 >162 April 13, 2010 > 159 July 06, 2010 > 151 10/11/2010 >150 12/01/2010 > 150 01/11/2011 > 04/27/2011  148> 148 07/31/2011 > 10/30/2011  148 > 01/31/2012  147 >143 06/04/2012 > 09/23/2012  149> 01/01/13  142 > 04/03/2013  140> 03/03/2014 141 >145 03/17/2014 >>142 04/20/2014 > 05/11/2014 >141 >141 06/11/14 > 09/22/2014 142 > 12/29/2014 139 > 04/20/2015  144 > 06/11/2015  142 >  08/24/2015  143 > 02/01/2016   144 > 05/02/2016  142 > 03/13/2017  139 >  06/26/2017  144 >   04/30/2018  139   Vital signs reviewed  07/22/2019  - Note at rest 02 sats  98% on RA     HEENT : pt wearing mask not removed for exam due to covid -19 concerns.    NECK :  without JVD/Nodes/TM/ nl carotid upstrokes bilaterally/ bruit on L    LUNGS: no acc muscle use,  Nl contour chest which is clear to A and P bilaterally without cough on insp or exp maneuvers   CV:  RRR  no s3 or murmur or increase in P2, and no edema   ABD:  soft and nontender with nl inspiratory excursion in the supine position. No bruits or organomegaly appreciated, bowel sounds nl  MS:  Nl gait/ ext warm without deformities, calf tenderness, cyanosis or clubbing No obvious joint restrictions   SKIN: warm and dry without lesions    NEURO:  alert, approp, nl sensorium with  no motor or cerebellar deficits apparent.         Labs ordered/ reviewed:      Chemistry      Component Value Date/Time   NA 136 07/22/2019 1045   K 4.3 07/22/2019 1045   CL 100 07/22/2019 1045   CO2 28 07/22/2019 1045   BUN 36 (H) 07/22/2019 1045   CREATININE 1.45 (H) 07/22/2019 1045      Component Value Date/Time   CALCIUM 9.5 07/22/2019 1045   ALKPHOS 27 (L) 05/26/2019 0250   AST 15 05/26/2019 0250   ALT 13 05/26/2019 0250   BILITOT 0.5 05/26/2019 0250        Lab Results  Component Value Date   WBC 7.7 07/22/2019   HGB 10.6 (L) 07/22/2019   HCT 31.6 (L) 07/22/2019   MCV 88.6 07/22/2019   PLT 198.0  07/22/2019       Lab Results  Component Value Date  TSH 0.767 05/25/2019     Lab Results  Component Value Date   PROBNP 234.0 (H) 07/22/2019                          Lab Results  Component Value Date   CREATININE 1.45 (H) 06/10/2019   CREATININE 1.46 (H) 05/26/2019   CREATININE 1.52 (H) 05/25/2019

## 2019-07-23 ENCOUNTER — Encounter: Payer: Self-pay | Admitting: Internal Medicine

## 2019-07-23 NOTE — Assessment & Plan Note (Signed)
  Lab Results  Component Value Date   HGB 10.6 (L) 07/22/2019   HGB 9.8 (L) 05/26/2019   HGB 11.3 (L) 05/25/2019     No w/u needed          Each maintenance medication was reviewed in detail including emphasizing most importantly the difference between maintenance and prns and under what circumstances the prns are to be triggered using an action plan format where appropriate.  Total time for H and P, chart review, counseling, teaching device and generating customized AVS unique to this post hosp office visit / charting = 30 min

## 2019-07-23 NOTE — Assessment & Plan Note (Signed)
Adequate control on present rx, reviewed in detail with pt > no change in rx needed   

## 2019-07-23 NOTE — Assessment & Plan Note (Signed)
Lab Results  Component Value Date   HGBA1C 6.9 (H) 05/20/2019   HGBA1C 6.7 (H) 11/12/2018   HGBA1C 6.8 (H) 04/30/2018    declined SSI insulin / tol metformin ok despite lower calculated creat clearance

## 2019-07-23 NOTE — Assessment & Plan Note (Addendum)
-  09/22/2014 p extensive coaching HFA effectiveness =    50%   - The proper method of use, as well as anticipated side effects, of a metered-dose inhaler are discussed and demonstrated to the patient.   Using symbicort prn Based on two studies from [place]; 20 p 1865 (2018) and 380 : p2020-30 (2019) in pts with mild asthma it is reasonable to use low dose symbicort eg 80 2bid "prn" flare in this setting but I emphasized this was only shown with symbicort and takes advantage of the rapid onset of action but is not the same as "rescue therapy" but can be stopped once the acute symptoms have resolved and the need for rescue has been minimized (< 2 x weekly)    All goals of chronic asthma control met including optimal function and elimination of symptoms with minimal need for rescue therapy.  Contingencies discussed in full including contacting this office immediately if not controlling the symptoms using the rule of two's.

## 2019-07-23 NOTE — Assessment & Plan Note (Signed)
Carotid Dopplers February/2021-60 to 79% stenosis left carotid artery, 1 to 39% right carotid artery.   No tia  Or viz complaints on asa daily > pt not interested in any kind of elective vascular surgery at this point so f/u prn symptoms advised (esp AF/ R hand or arm weakness or speech issues)         Each maintenance medication was reviewed in detail including emphasizing most importantly the difference between maintenance and prns and under what circumstances the prns are to be triggered using an action plan format where appropriate.  Total time for H and P, chart review, counseling, reviewing hfa device and generating customized AVS unique to this post hosp f/u office visit / charting = 30 min

## 2019-07-23 NOTE — Progress Notes (Signed)
Spoke with pt and notified of results per Dr. [name]. Pt verbalized understanding and denied any questions. 

## 2019-07-23 NOTE — Progress Notes (Signed)
LMTCB

## 2019-09-02 DIAGNOSIS — H348322 Tributary (branch) retinal vein occlusion, left eye, stable: Secondary | ICD-10-CM | POA: Diagnosis not present

## 2019-09-02 DIAGNOSIS — E113311 Type 2 diabetes mellitus with moderate nonproliferative diabetic retinopathy with macular edema, right eye: Secondary | ICD-10-CM | POA: Diagnosis not present

## 2019-09-02 DIAGNOSIS — E113212 Type 2 diabetes mellitus with mild nonproliferative diabetic retinopathy with macular edema, left eye: Secondary | ICD-10-CM | POA: Diagnosis not present

## 2019-09-02 DIAGNOSIS — H35033 Hypertensive retinopathy, bilateral: Secondary | ICD-10-CM | POA: Diagnosis not present

## 2019-09-02 DIAGNOSIS — H3581 Retinal edema: Secondary | ICD-10-CM | POA: Diagnosis not present

## 2019-09-02 DIAGNOSIS — H35373 Puckering of macula, bilateral: Secondary | ICD-10-CM | POA: Diagnosis not present

## 2019-09-16 DIAGNOSIS — E113311 Type 2 diabetes mellitus with moderate nonproliferative diabetic retinopathy with macular edema, right eye: Secondary | ICD-10-CM | POA: Diagnosis not present

## 2019-09-19 ENCOUNTER — Other Ambulatory Visit: Payer: Self-pay | Admitting: Internal Medicine

## 2019-10-07 DIAGNOSIS — E113212 Type 2 diabetes mellitus with mild nonproliferative diabetic retinopathy with macular edema, left eye: Secondary | ICD-10-CM | POA: Diagnosis not present

## 2019-10-15 ENCOUNTER — Encounter: Payer: Self-pay | Admitting: Internal Medicine

## 2019-10-16 DIAGNOSIS — Z961 Presence of intraocular lens: Secondary | ICD-10-CM | POA: Diagnosis not present

## 2019-10-16 DIAGNOSIS — E113293 Type 2 diabetes mellitus with mild nonproliferative diabetic retinopathy without macular edema, bilateral: Secondary | ICD-10-CM | POA: Diagnosis not present

## 2019-10-16 DIAGNOSIS — H401131 Primary open-angle glaucoma, bilateral, mild stage: Secondary | ICD-10-CM | POA: Diagnosis not present

## 2019-10-16 DIAGNOSIS — H26493 Other secondary cataract, bilateral: Secondary | ICD-10-CM | POA: Diagnosis not present

## 2019-11-11 ENCOUNTER — Ambulatory Visit: Payer: Medicare Other | Admitting: Internal Medicine

## 2019-12-15 ENCOUNTER — Other Ambulatory Visit: Payer: Self-pay | Admitting: Gastroenterology

## 2019-12-22 ENCOUNTER — Other Ambulatory Visit: Payer: Self-pay | Admitting: Internal Medicine

## 2019-12-30 ENCOUNTER — Other Ambulatory Visit: Payer: Self-pay

## 2019-12-30 ENCOUNTER — Ambulatory Visit (INDEPENDENT_AMBULATORY_CARE_PROVIDER_SITE_OTHER): Payer: Medicare Other | Admitting: Internal Medicine

## 2019-12-30 ENCOUNTER — Encounter: Payer: Self-pay | Admitting: Internal Medicine

## 2019-12-30 DIAGNOSIS — E039 Hypothyroidism, unspecified: Secondary | ICD-10-CM | POA: Diagnosis not present

## 2019-12-30 DIAGNOSIS — I6522 Occlusion and stenosis of left carotid artery: Secondary | ICD-10-CM

## 2019-12-30 DIAGNOSIS — E1169 Type 2 diabetes mellitus with other specified complication: Secondary | ICD-10-CM

## 2019-12-30 DIAGNOSIS — I10 Essential (primary) hypertension: Secondary | ICD-10-CM | POA: Diagnosis not present

## 2019-12-30 DIAGNOSIS — E785 Hyperlipidemia, unspecified: Secondary | ICD-10-CM | POA: Diagnosis not present

## 2019-12-30 DIAGNOSIS — E669 Obesity, unspecified: Secondary | ICD-10-CM

## 2019-12-30 LAB — BASIC METABOLIC PANEL
BUN: 31 mg/dL — ABNORMAL HIGH (ref 6–23)
CO2: 29 mEq/L (ref 19–32)
Calcium: 9.6 mg/dL (ref 8.4–10.5)
Chloride: 100 mEq/L (ref 96–112)
Creatinine, Ser: 1.34 mg/dL — ABNORMAL HIGH (ref 0.40–1.20)
GFR: 36.96 mL/min — ABNORMAL LOW (ref >60.00)
Glucose, Bld: 166 mg/dL — ABNORMAL HIGH (ref 70–99)
Potassium: 4.4 mEq/L (ref 3.5–5.1)
Sodium: 136 mEq/L (ref 135–145)

## 2019-12-30 LAB — LDL CHOLESTEROL, DIRECT: Direct LDL: 151 mg/dL

## 2019-12-30 LAB — LIPID PANEL
Cholesterol: 293 mg/dL — ABNORMAL HIGH (ref 0–200)
HDL: 44 mg/dL (ref >39.00)
NonHDL: 249.42
Total CHOL/HDL Ratio: 7
Triglycerides: 321 mg/dL — ABNORMAL HIGH (ref 0.0–149.0)
VLDL: 64.2 mg/dL — ABNORMAL HIGH (ref 0.0–40.0)

## 2019-12-30 LAB — TSH: TSH: 2.03 u[IU]/mL (ref 0.35–4.50)

## 2019-12-30 LAB — HEPATIC FUNCTION PANEL
ALT: 7 U/L (ref 0–35)
AST: 12 U/L (ref 0–37)
Albumin: 4.1 g/dL (ref 3.5–5.2)
Alkaline Phosphatase: 34 U/L — ABNORMAL LOW (ref 39–117)
Bilirubin, Direct: 0.1 mg/dL (ref 0.0–0.3)
Total Bilirubin: 0.4 mg/dL (ref 0.2–1.2)
Total Protein: 6.9 g/dL (ref 6.0–8.3)

## 2019-12-30 LAB — HEMOGLOBIN A1C: Hgb A1c MFr Bld: 7.1 % — ABNORMAL HIGH (ref 4.6–6.5)

## 2019-12-30 NOTE — Progress Notes (Signed)
Called and left message on voicemail to please call back.

## 2019-12-30 NOTE — Progress Notes (Signed)
Subjective:     Patient ID: Mikayla [name], female   DOB: Sep 16, 1925     MRN: [id number]  Brief patient profile:  89  yowf never smoker with moderate obesity> adult onset diabetes dx 2002 and hyperlipidemia and hbp/ asthma       History of Present Illness  12/18/2017  f/u ov/Anaia Frith re:  HBP/ hyperlipidemia/ / dm  Chief Complaint  Patient presents with  . Follow-up   Dyspnea:  Not limited by breathing from desired activities  But more sluggish on bid bisoprolol Cough: none  Sleeping: fine flat Not checking bp or CBG's between ov's and not interested at all in doing so > no overt symptoms of high or low sugars  rec You need your drug formulary from insurance when you return  Change bisorpolol to 5 mg one daily  Add amlopdine 5 mg one daily   Admit date: 05/25/2019 Discharge date: 05/26/2019  Recommendations for Outpatient Follow-up:  1. PCP in 1 week, please check Bmet at FU 2. ARB discontinued, norvasc dose increased    Discharge Diagnoses:  Principal Problem:   Dyspnea    Diabetes mellitus type 2 in obese (HCC)   Hyperlipidemia associated with type 2 diabetes mellitus ([place])   Essential hypertension   Hypothyroidism, acquired   Chronic renal disease, stage III   Hyperkalemia   AKI (acute kidney injury) ([place])   History of present illness:  86 year old female with history of asthma, CKD stage III, type 2 diabetes mellitus, hypertension, dyslipidemia presented to the emergency room with acute onset dyspnea overnight.  -Work-up in the ED noted mild worsening creatinine to 1.7, potassium of 6.0, high-sensitivity troponin was 6 x2, D-dimer was 1.1, chest x-ray was clear  Hospital Course:   AKI/Hyperkalemia,on CKD 3 -likely due to ARB/lasix use -ARB stopped, treated with calcium gluconate, Lokelma -resolved at discharge -Clinically appears euvolemic    Subacute dyspnea -Etiology is unclear at this time, appears to have improved -EKG without considerable  changes and high-sensitivity troponin negative x2 -Does not have hypoxia at this time, chest x-ray is clear,lungs are clear, no edema -Covid PCR is negative,follow-up echo -D-dimer is mildly elevated, but no symptoms of PE, dopplers negative, ECHO noted LVH, EF 60-65%, asymmetrical hypertrophy of the basal septum, no significant valvular disease, diastolic parameters were indeterminate. -Patient ambulated down the halls with physical therapy, her O2 sats remained in the mid 90s without subjective sensation of dyspnea -Low-dose Lasix resumed -Asthma felt to be stable, no wheezing noted    Type 2 diabetes mellitus -resumed Metformin, patient declines sliding scale insulin  Hypertension -Stable, continue bisoprolol, amlodipine          07/22/2019  Post hosp f/u  ov/Givanni Staron re: got both vaccines for covid 19 Chief Complaint  Patient presents with  . Follow-up    Doing well and denies any co's today. She is scheduled to see Nephrology 08/12/19.   Dyspnea:  Not limited by breathing from desired activities   Cough: none  Sleeping: bed is flat / r side/ 2 pillows  SABA use: none 02: none rec No change   12/30/2019  f/u ov/Shafiq Larch re: hbp/hyperlipidemia/ dm  Chief Complaint  Patient presents with  . Follow-up    no compliants  Dyspnea:  Not limited by breathing from desired activities   -avoids steps due to knees  Cough: none  Sleeping: bed is flat/ 2 pillows  SABA use: none  02: none  Refuses to check cbgs    No obvious day to  day or daytime variability or assoc excess/ purulent sputum or mucus plugs or hemoptysis or cp or chest tightness, subjective wheeze or overt sinus or hb symptoms.   Sleeping  without nocturnal  or early am exacerbation  of respiratory  c/o's or need for noct saba. Also denies any obvious fluctuation of symptoms with weather or environmental changes or other aggravating or alleviating factors except as outlined above   No unusual exposure hx or h/o  childhood pna/ asthma or knowledge of premature birth.  Current Allergies, Complete Past Medical History, Past Surgical History, Family History, and Social History were reviewed in [employer] record.  ROS  The following are not active complaints unless bolded Hoarseness, sore throat, dysphagia, dental problems, itching, sneezing,  nasal congestion or discharge of excess mucus or purulent secretions, ear ache,   fever, chills, sweats, unintended wt loss or wt gain, classically pleuritic or exertional cp,  orthopnea pnd or arm/hand swelling  or leg swelling, presyncope, palpitations, abdominal pain, anorexia, nausea, vomiting, diarrhea  or change in bowel habits or change in bladder habits, change in stools or change in urine, dysuria, hematuria,  rash, arthralgias, visual complaints, headache, numbness, weakness or ataxia or problems with walking or coordination,  change in mood or  memory.        Current Meds  Medication Sig  . acetaminophen (TYLENOL) 325 MG tablet Take 325 mg by mouth every 6 (six) hours as needed for mild pain or fever.   Marland Kitchen amLODipine (NORVASC) 10 MG tablet TAKE 1 TABLET (10 MG TOTAL) BY MOUTH DAILY  . aspirin 81 MG tablet Take 81 mg by mouth daily.   . bisoprolol (ZEBETA) 5 MG tablet Take 1 tablet (5 mg total) by mouth 2 (two) times daily.  . budesonide-formoterol (SYMBICORT) 80-4.5 MCG/ACT inhaler Inhale 2 puffs into the lungs 2 (two) times daily as needed (shortness of breath/wheezing).  . calcium carbonate (TUMS EX) 750 MG chewable tablet Chew 1 tablet by mouth 3 (three) times daily with meals.   . Cholecalciferol (VITAMIN D) 1000 UNITS capsule Take 1,000 Units by mouth daily.    . cyclobenzaprine (FLEXERIL) 10 MG tablet Take 1 tablet (10 mg total) by mouth 3 (three) times daily as needed for muscle spasms.  Marland Kitchen dextromethorphan (DELSYM) 30 MG/5ML liquid Take 60 mg by mouth every 12 (twelve) hours as needed for cough.   . dorzolamide-timolol (COSOPT)  22.3-6.8 MG/ML ophthalmic solution Place 1 drop into both eyes 2 (two) times daily.   . famotidine (PEPCID) 20 MG tablet Take 20 mg by mouth at bedtime as needed for heartburn (COUGH FLARE).   . furosemide (LASIX) 20 MG tablet TAKE 1 TABLET BY MOUTH DAILY. (Patient taking differently: Take 20 mg by mouth daily. )  . Homeopathic Products ([employer] LEG CRAMPS PAIN RELIEF PO) Take 1 tablet by mouth as needed (for leg cramps).   Marland Kitchen levothyroxine (SYNTHROID) 50 MCG tablet TAKE 1 TABLET BY MOUTH DAILY BEFORE BREAKFAST.  Marland Kitchen loratadine (CLARITIN) 10 MG tablet Take 10 mg by mouth daily as needed (DRIPPY NOSE/WATERY EYE/INTIATIVE).   Marland Kitchen metFORMIN (GLUCOPHAGE) 500 MG tablet Take 1 tablet (500 mg total) by mouth 2 (two) times daily with a meal.  . Multiple Vitamins-Minerals (CENTRUM SILVER PO) Take 1 tablet by mouth daily.    . Multiple Vitamins-Minerals (ICAPS PO) Take 1 tablet by mouth daily.   Marland Kitchen omeprazole (PRILOSEC) 40 MG capsule TAKE 1 CAPSULE BY MOUTH DAILY.  . simvastatin (ZOCOR) 20 MG tablet Take 1 tablet (  20 mg total) by mouth at bedtime.                             Past Medical History:  ESSENTIAL HYPERTENSION, BENIGN (ICD-401.1)  Glaucoma................................................................................[name]/[name]  HYPERLIPIDEMIA (ICD-272.4)  - target LDL less than 70 because of diabetes  -Zocor 20mg  every other day at bedtime April 14, 2010>>> LDL 110 July 06, 2010 and no recurrent  cramps  RHINITIS (ICD-472.0)  Intermittent asthma  - exac by Timolol but tolerates unless uri/flare  OSTEOPENIA (ICD-733.90)  - DEXA 12/28/07 PA Spine .655, L Hip -1.033, Lfem Neck -1.793  - DEXA 02/23/10 1.7 L Fem -1.9 Right Fem -1.8  OBESITY (ICD-278.00)  - Target wt = 148 for BMI < 30  AODM (ICD-250.00)  Carotid stenosis   By u/s 06/18/19 x 60-79% on L , R with 1-39%  Health Maintenance.........................................................................Marland KitchenWert  - Td  12/05/16    - Pneumovax 04/2013 /  prevnar 03/03/14 - CPX 03/03/2014  - GYN Health Maint : [name], declines mammography and colonoscopy  --med calendar 06/04/2012 , 08/28/2013 , 04/20/14 , 11/23/2015        Objective:   Physical Exam        12/30/2019 140  07/22/2019    140  05/20/2019   140  wt 159 November 05, 2008 >162 April 13, 2010 > 159 July 06, 2010 > 151 10/11/2010 >150 12/01/2010 > 150 01/11/2011 > 04/27/2011  148> 148 07/31/2011 > 10/30/2011  148 > 01/31/2012  147 >143 06/04/2012 > 09/23/2012  149> 01/01/13  142 > 04/03/2013  140> 03/03/2014 141 >145 03/17/2014 >>142 04/20/2014 > 05/11/2014 >141 >141 06/11/14 > 09/22/2014 142 > 12/29/2014 139 > 04/20/2015  144 > 06/11/2015  142 >  08/24/2015  143 > 02/01/2016   144 > 05/02/2016  142 > 03/13/2017  139 >  06/26/2017  144 >   04/30/2018  139     Vital signs reviewed  12/30/2019  - Note at rest 02 sats  97% on RA   amb wf very HOH   HEENT : pt wearing mask not removed for exam due to covid -19 concerns.    NECK :  without JVD/Nodes/TM/ nl carotid upstrokes bilaterally   LUNGS: no acc muscle use,  Nl contour chest which is clear to A and P bilaterally without cough on insp or exp maneuvers   CV:  RRR  no s3 or murmur or increase in P2, and no edema   ABD:  soft and nontender with nl inspiratory excursion in the supine position. No bruits or organomegaly appreciated, bowel sounds nl  MS:  Nl gait/ ext warm without deformities, calf tenderness, cyanosis or clubbing No obvious joint restrictions   SKIN: warm and dry without lesions    NEURO:  alert, approp, nl sensorium with  no motor or cerebellar deficits apparent.    Labs ordered/ reviewed:      Chemistry      Component Value Date/Time   NA 136 12/30/2019 1059   K 4.4 12/30/2019 1059   CL 100 12/30/2019 1059   CO2 29 12/30/2019 1059   BUN 31 (H) 12/30/2019 1059   CREATININE 1.34 (H) 12/30/2019 1059    GLucose                      166               12/30/2019     Component Value  Date/Time    CALCIUM 9.6 12/30/2019 1059   ALKPHOS 34 (L) 12/30/2019 1059   AST 12 12/30/2019 1059   ALT 7 12/30/2019 1059   BILITOT 0.4 12/30/2019 1059          Lab Results  Component Value Date   HGBA1C 7.1 (H) 12/30/2019   HGBA1C 6.9 (H) 05/20/2019   HGBA1C 6.7 (H) 11/12/2018

## 2019-12-30 NOTE — Patient Instructions (Addendum)
Please remember to go to the lab department   for your tests - we will call you with the results when they are available.      See [name] in 3 months with all your medications, even over the counter meds, separated in two separate bags, the ones you take no matter what vs the ones you stop once you feel better and take only as needed when you feel you need them.   Tammy  will generate for you a new user friendly medication calendar that will put [place] all on the same page re: your medication use.     Without this process, it simply isn't possible to assure that we are providing  your outpatient care  with  the attention to detail we feel you deserve.   If we cannot assure that you're getting that kind of care,  then we cannot manage your problem effectively from this clinic.  Once you have seen Tammy and we are sure that we're all on the same page with your medication use she will arrange follow up with me.  Add :  Repeat bmet/ cbc/ hgbA1c ? Need to increase glucophage?

## 2019-12-31 ENCOUNTER — Telehealth: Payer: Self-pay | Admitting: Internal Medicine

## 2019-12-31 ENCOUNTER — Encounter: Payer: Self-pay | Admitting: Internal Medicine

## 2019-12-31 NOTE — Assessment & Plan Note (Addendum)
-   Target LDL < 100 since has DM but poor statin tolerance    Lab Results  Component Value Date   CHOL 293 (H) 12/30/2019   HDL 44.00 12/30/2019   LDLCALC 125 (H) 03/13/2017   LDLDIRECT 151.0 12/30/2019   TRIG 321.0 (H) 12/30/2019   CHOLHDL 7 12/30/2019     Lab Results  Component Value Date   ALT 7 12/30/2019   AST 12 12/30/2019   ALKPHOS 34 (L) 12/30/2019   BILITOT 0.4 12/30/2019    continue zocor 20 mg daily and work on diet as tol increase statins so poorly in past and little data on the 90+ population as to risk/benefit.

## 2019-12-31 NOTE — Assessment & Plan Note (Signed)
06/26/17 rx  synthroid 25 mcg per day with tsh  7.38  - 05/20/2019  TSH 1.19 on synthroid 50 mcg per day   Lab Results  Component Value Date   TSH 2.03 12/30/2019     Adequate control on present rx, reviewed in detail with pt > no change in rx needed  = 50 mcg per day

## 2019-12-31 NOTE — Assessment & Plan Note (Signed)
Lab Results  Component Value Date   HGBA1C 7.1 (H) 12/30/2019   HGBA1C 6.9 (H) 05/20/2019   HGBA1C 6.7 (H) 11/12/2018      Asymptomatic, declines to check cbgs/ may need to consider increase  glucophage as less risk of hypoglycemia than adding glyburide > rev-eval in 3 m

## 2019-12-31 NOTE — Assessment & Plan Note (Addendum)
-   new onset 03/03/2014 ? Stress related   Lab Results  Component Value Date   CREATININE 1.34 (H) 12/30/2019   CREATININE 1.45 (H) 07/22/2019   CREATININE 1.45 (H) 06/10/2019     Offered to refer to geriatric specialist/ declined    Each maintenance medication was reviewed in detail including most importantly the difference between maintenance and as needed and under what circumstances the prns are to be used. This was done in the context of a medication calendar review which provided the patient with a user-friendly unambiguous mechanism for medication administration and reconciliation and provides an action plan for all active problems. It is critical that this be shown to every doctor  for modification during the office visit if necessary so the patient can use it as a working document.            >> f/u 3 m    Total time for H and P, chart review, counseling, teaching device and generating customized AVS unique to this office visit / charting = 20 min

## 2019-12-31 NOTE — Telephone Encounter (Signed)
See result note.  

## 2019-12-31 NOTE — Progress Notes (Signed)
Spoke with patient and provided test results per Dr. Melvyn Novas.  Answered questions regarding specific values for specific tests.  She verbalized understanding.  Nothing further needed.

## 2020-01-20 DIAGNOSIS — E113313 Type 2 diabetes mellitus with moderate nonproliferative diabetic retinopathy with macular edema, bilateral: Secondary | ICD-10-CM | POA: Diagnosis not present

## 2020-01-20 DIAGNOSIS — H35373 Puckering of macula, bilateral: Secondary | ICD-10-CM | POA: Diagnosis not present

## 2020-01-20 DIAGNOSIS — H35363 Drusen (degenerative) of macula, bilateral: Secondary | ICD-10-CM | POA: Diagnosis not present

## 2020-01-20 DIAGNOSIS — H3582 Retinal ischemia: Secondary | ICD-10-CM | POA: Diagnosis not present

## 2020-01-20 DIAGNOSIS — H47011 Ischemic optic neuropathy, right eye: Secondary | ICD-10-CM | POA: Diagnosis not present

## 2020-01-20 DIAGNOSIS — H353132 Nonexudative age-related macular degeneration, bilateral, intermediate dry stage: Secondary | ICD-10-CM | POA: Diagnosis not present

## 2020-01-20 DIAGNOSIS — H3581 Retinal edema: Secondary | ICD-10-CM | POA: Diagnosis not present

## 2020-01-20 DIAGNOSIS — H35033 Hypertensive retinopathy, bilateral: Secondary | ICD-10-CM | POA: Diagnosis not present

## 2020-02-06 ENCOUNTER — Other Ambulatory Visit: Payer: Self-pay | Admitting: Internal Medicine

## 2020-03-22 ENCOUNTER — Other Ambulatory Visit: Payer: Self-pay | Admitting: Internal Medicine

## 2020-03-31 ENCOUNTER — Ambulatory Visit: Payer: Medicare Other | Admitting: Adult Health

## 2020-04-06 ENCOUNTER — Ambulatory Visit: Payer: Medicare Other | Admitting: Adult Health

## 2020-04-13 ENCOUNTER — Ambulatory Visit (INDEPENDENT_AMBULATORY_CARE_PROVIDER_SITE_OTHER): Payer: Medicare Other | Admitting: Adult Health

## 2020-04-13 ENCOUNTER — Encounter: Payer: Self-pay | Admitting: Adult Health

## 2020-04-13 ENCOUNTER — Other Ambulatory Visit: Payer: Self-pay

## 2020-04-13 VITALS — BP 120/76 | HR 62 | Ht <= 58 in | Wt 139.2 lb

## 2020-04-13 DIAGNOSIS — I6522 Occlusion and stenosis of left carotid artery: Secondary | ICD-10-CM | POA: Diagnosis not present

## 2020-04-13 DIAGNOSIS — E039 Hypothyroidism, unspecified: Secondary | ICD-10-CM | POA: Diagnosis not present

## 2020-04-13 DIAGNOSIS — N1832 Chronic kidney disease, stage 3b: Secondary | ICD-10-CM | POA: Diagnosis not present

## 2020-04-13 DIAGNOSIS — E669 Obesity, unspecified: Secondary | ICD-10-CM

## 2020-04-13 DIAGNOSIS — I10 Essential (primary) hypertension: Secondary | ICD-10-CM | POA: Diagnosis not present

## 2020-04-13 DIAGNOSIS — E785 Hyperlipidemia, unspecified: Secondary | ICD-10-CM | POA: Diagnosis not present

## 2020-04-13 DIAGNOSIS — Z23 Encounter for immunization: Secondary | ICD-10-CM

## 2020-04-13 DIAGNOSIS — E1169 Type 2 diabetes mellitus with other specified complication: Secondary | ICD-10-CM

## 2020-04-13 NOTE — Patient Instructions (Signed)
Continue on current regimen Keep up the good work.  Low-salt , sugar diet . Low cholesterol diet .  Activity as tolerated.  Flu shot today  Follow-up in 3 months with Dr. Melvyn Novas  And As needed   with labs Please contact office for sooner follow up if symptoms do not improve or worsen or seek emergency care

## 2020-04-13 NOTE — Progress Notes (Signed)
@Patient  ID: Mikayla [name], female    DOB: 1925/06/20, 86 y.o.   MRN: [id number]  Chief Complaint  Patient presents with  . Follow-up    Referring provider: Tanda Rockers, MD  HPI: 86 year old female never smoker followed for diabetes, hypertension, hyperlipidemia chronic kidney disease and mild intermittent asthma.  She has a primary care patient of Dr. Melvyn Novas  TEST/EVENTS :   04/13/2020 Follow up : HTN , DM , Asthma , hyperlipidemia Patient presents for a 83-month follow-up.  Patient has underlying hypertension, diabetes and hyperlipidemia, chronic kidney disease-Stage 3b . Last visit patient was recommended on a low-fat low-cholesterol diet.  Her cholesterol Levels remain elevated.  Patient declines statin therapy due to intolerances.  And does not want to be on any medications for her cholesterol.  Total cholesterol was elevated at 293, triglycerides 321 HDL was 44 and LDL 151.  We discussed low fat/cholesterol diet .  Diabetes is somewhat stable A1c did trend up slightly at 7.1.  Patient denies any hypoglycemic episodes.  Patient tries to keep her sugars down in her diet.  She remains on Metformin 500mg  Twice daily .  Patient says overall she is doing okay.  Activity levels remain at baseline.  She is had no change in her activity tolerance. Blood pressure has been doing well.  She remains on bisoprolol and Norvasc.  We discussed a low-salt diet. Overall for 89 she remains living independently at home.  She is able to do light housework.  And she overall feels well. Daughter has recently moved to [place].  We reviewed all her medications organize them into a medication calendar and updated her medication list.  Patient education was given.  It appears that she is taking her medications correctly  Flu shot requested today. Covid vaccine x 2. Discussed covid booster.    Allergies  Allergen Reactions  . Amoxicillin Rash  . Lipitor [Atorvastatin]     REACTION: aches     Immunization History  Administered Date(s) Administered  . Fluad Quad(high Dose 65+) 02/25/2019, 04/13/2020  . Influenza Split 01/25/2011, 01/31/2012  . Influenza Whole 02/28/2007, 04/02/2008, 03/12/2009, 02/10/2010  . Influenza, High Dose Seasonal PF 02/01/2016, 03/13/2017, 01/29/2018  . Influenza,inj,Quad PF,6+ Mos 04/02/2013, 03/03/2014, 04/20/2015  . PFIZER SARS-COV-2 Vaccination 06/19/2019, 07/14/2019  . Pneumococcal Conjugate-13 03/03/2014  . Pneumococcal Polysaccharide-23 06/04/2012  . Pneumococcal-Unspecified 09/12/2000  . Td 08/13/2006  . Tdap 12/05/2016    Past Medical History:  Diagnosis Date  . Chronic rhinitis   . CKD (chronic kidney disease), stage III ([place])   . Disorder of bone and cartilage, unspecified   . Essential hypertension, benign   . Obesity, unspecified   . Other and unspecified hyperlipidemia   . Type II or unspecified type diabetes mellitus without mention of complication, not stated as uncontrolled     Tobacco History: Social History   Tobacco Use  Smoking Status Never Smoker  Smokeless Tobacco Never Used   Counseling given: Not Answered   Outpatient Medications Prior to Visit  Medication Sig Dispense Refill  . acetaminophen (TYLENOL) 325 MG tablet Take 325 mg by mouth every 6 (six) hours as needed for mild pain or fever.     Marland Kitchen amLODipine (NORVASC) 10 MG tablet TAKE 1 TABLET (10 MG TOTAL) BY MOUTH DAILY 90 tablet 1  . aspirin 81 MG tablet Take 81 mg by mouth daily.     . bisoprolol (ZEBETA) 5 MG tablet Take 1 tablet (5 mg total) by mouth 2 (two) times  daily. 180 tablet 1  . budesonide-formoterol (SYMBICORT) 80-4.5 MCG/ACT inhaler Inhale 2 puffs into the lungs 2 (two) times daily as needed (shortness of breath/wheezing). 1 Inhaler 0  . calcium carbonate (TUMS EX) 750 MG chewable tablet Chew 1 tablet by mouth 3 (three) times daily with meals.     . Cholecalciferol (VITAMIN D) 1000 UNITS capsule Take 1,000 Units by mouth daily.      .  cyclobenzaprine (FLEXERIL) 10 MG tablet Take 1 tablet (10 mg total) by mouth 3 (three) times daily as needed for muscle spasms. 30 tablet 1  . dextromethorphan (DELSYM) 30 MG/5ML liquid Take 60 mg by mouth every 12 (twelve) hours as needed for cough.     . dorzolamide-timolol (COSOPT) 22.3-6.8 MG/ML ophthalmic solution Place 1 drop into both eyes 2 (two) times daily.     . famotidine (PEPCID) 20 MG tablet Take 20 mg by mouth at bedtime as needed for heartburn (COUGH FLARE).     . furosemide (LASIX) 20 MG tablet TAKE 1 TABLET BY MOUTH DAILY. 90 tablet 3  . Homeopathic Products ([employer] LEG CRAMPS PAIN RELIEF PO) Take 1 tablet by mouth as needed (for leg cramps).     Marland Kitchen levothyroxine (SYNTHROID) 50 MCG tablet TAKE 1 TABLET BY MOUTH DAILY BEFORE BREAKFAST. 90 tablet 3  . loratadine (CLARITIN) 10 MG tablet Take 10 mg by mouth daily as needed (DRIPPY NOSE/WATERY EYE/INTIATIVE).     Marland Kitchen metFORMIN (GLUCOPHAGE) 500 MG tablet Take 1 tablet (500 mg total) by mouth 2 (two) times daily with a meal. 180 tablet 1  . Multiple Vitamins-Minerals (CENTRUM SILVER PO) Take 1 tablet by mouth daily.      . Multiple Vitamins-Minerals (ICAPS PO) Take 1 tablet by mouth daily.     Marland Kitchen omeprazole (PRILOSEC) 40 MG capsule TAKE 1 CAPSULE BY MOUTH DAILY. 30 capsule 1  . simvastatin (ZOCOR) 20 MG tablet Take 1 tablet (20 mg total) by mouth at bedtime. 90 tablet 3   No facility-administered medications prior to visit.     Review of Systems:   Constitutional:   No  weight loss, night sweats,  Fevers, chills, fatigue, or  lassitude.  HEENT:   No headaches,  Difficulty swallowing,  Tooth/dental problems, or  Sore throat,                No sneezing, itching, ear ache, nasal congestion, post nasal drip,   CV:  No chest pain,  Orthopnea, PND, swelling in lower extremities, anasarca, dizziness, palpitations, syncope.   GI  No heartburn, indigestion, abdominal pain, nausea, vomiting, diarrhea, change in bowel habits, loss of appetite,  bloody stools.   Resp: No shortness of breath with exertion or at rest.  No excess mucus, no productive cough,  No non-productive cough,  No coughing up of blood.  No change in color of mucus.  No wheezing.  No chest wall deformity  Skin: no rash or lesions.  GU: no dysuria, change in color of urine, no urgency or frequency.  No flank pain, no hematuria   MS:  No joint pain or swelling.  No decreased range of motion.  No back pain.    Physical Exam  BP 120/76 (BP Location: Left Arm, Cuff Size: Normal)   Pulse 62   Ht 4\' 9"  (1.448 m)   Wt 139 lb 3.2 oz (63.1 kg)   SpO2 99%   BMI 30.12 kg/m   GEN: A/Ox3; pleasant , NAD, elderly , appears younger than stated age  HEENT:  [place]/AT,    NOSE-clear, THROAT-clear, no lesions, no postnasal drip or exudate noted.   NECK:  Supple w/ fair ROM; no JVD; normal carotid impulses w/o bruits; no thyromegaly or nodules palpated; no lymphadenopathy.    RESP  Clear  P & A; w/o, wheezes/ rales/ or rhonchi. no accessory muscle use, no dullness to percussion  CARD:  RRR, no m/r/g, no peripheral edema, pulses intact, no cyanosis or clubbing.  GI:   Soft & nt; nml bowel sounds; no organomegaly or masses detected.   Musco: Warm bil, no deformities or joint swelling noted.   Neuro: alert, no focal deficits noted.    Skin: Warm, no lesions or rashes    Lab Results:    BNP  Imaging: No results found.    No flowsheet data found.  No results found for: NITRICOXIDE      Assessment & Plan:   No problem-specific Assessment & Plan notes found for this encounter.     Rexene Edison, NP 04/13/2020

## 2020-04-14 NOTE — Assessment & Plan Note (Signed)
Renal function stable. Continue to avoid nonsteroidals. Patient declines referral to nephrology

## 2020-04-14 NOTE — Assessment & Plan Note (Signed)
Well-controlled on current regimen  Plan  Patient Instructions  Continue on current regimen Keep up the good work.  Low-salt , sugar diet . Low cholesterol diet .  Activity as tolerated.  Flu shot today  Follow-up in 3 months with Dr. Melvyn Novas  And As needed   with labs Please contact office for sooner follow up if symptoms do not improve or worsen or seek emergency care

## 2020-04-14 NOTE — Assessment & Plan Note (Signed)
Continue low-fat diet. Patient declines statin therapy.

## 2020-04-14 NOTE — Assessment & Plan Note (Signed)
Continue Synthroid °

## 2020-04-14 NOTE — Assessment & Plan Note (Signed)
Stable control. Patient education on dietary management. Continue on current dose of metformin. Will recheck labs on return. Can consider going up on Metformin if needed would like to avoid using sulfonylureas if possible to avoid any possible hypoglycemic episodes. Given her age.

## 2020-04-26 ENCOUNTER — Telehealth: Payer: Self-pay | Admitting: Internal Medicine

## 2020-04-26 MED ORDER — CYCLOBENZAPRINE HCL 10 MG PO TABS: 10.0000 mg | ORAL_TABLET | Freq: Three times a day (TID) | ORAL | 1 refills | Status: DC | PRN

## 2020-04-26 NOTE — Telephone Encounter (Signed)
Ok to refill flexoril, f/u ortho prn

## 2020-04-26 NOTE — Telephone Encounter (Signed)
Spoke with pt, states she fell and pulled a muscle in her back in the fall. Pt has been taking ibuprofen to help with pain, but does not wish to take this long-term.  Pt is requesting a muscle relaxer.  Per chart pt has been prescribed flexeril 10mg  TID prn.  Pt states this is more for "muscle cramps" and is a few years old.    Pharmacy: [facility]   MW please advise, thanks!

## 2020-04-26 NOTE — Telephone Encounter (Signed)
Called and spoke with pt letting her know that MW was okay with [place] refilling her flexeril and she verbalized understanding. Called pt's pharmacy and spoke with pharmacist Colletta Maryland and provided a verbal of pt's med and instructions. Nothing further needed.

## 2020-05-14 ENCOUNTER — Other Ambulatory Visit: Payer: Self-pay | Admitting: Internal Medicine

## 2020-06-09 DIAGNOSIS — H26493 Other secondary cataract, bilateral: Secondary | ICD-10-CM | POA: Diagnosis not present

## 2020-06-09 DIAGNOSIS — H35373 Puckering of macula, bilateral: Secondary | ICD-10-CM | POA: Diagnosis not present

## 2020-06-09 DIAGNOSIS — H3581 Retinal edema: Secondary | ICD-10-CM | POA: Diagnosis not present

## 2020-06-09 DIAGNOSIS — H3562 Retinal hemorrhage, left eye: Secondary | ICD-10-CM | POA: Diagnosis not present

## 2020-06-15 ENCOUNTER — Ambulatory Visit (INDEPENDENT_AMBULATORY_CARE_PROVIDER_SITE_OTHER): Payer: Medicare Other | Admitting: Internal Medicine

## 2020-06-15 ENCOUNTER — Encounter: Payer: Self-pay | Admitting: Internal Medicine

## 2020-06-15 ENCOUNTER — Other Ambulatory Visit: Payer: Self-pay

## 2020-06-15 DIAGNOSIS — E039 Hypothyroidism, unspecified: Secondary | ICD-10-CM

## 2020-06-15 DIAGNOSIS — E669 Obesity, unspecified: Secondary | ICD-10-CM

## 2020-06-15 DIAGNOSIS — E785 Hyperlipidemia, unspecified: Secondary | ICD-10-CM

## 2020-06-15 DIAGNOSIS — E1169 Type 2 diabetes mellitus with other specified complication: Secondary | ICD-10-CM

## 2020-06-15 DIAGNOSIS — J4520 Mild intermittent asthma, uncomplicated: Secondary | ICD-10-CM

## 2020-06-15 DIAGNOSIS — I10 Essential (primary) hypertension: Secondary | ICD-10-CM

## 2020-06-15 LAB — TSH: TSH: 2.68 u[IU]/mL (ref 0.35–4.50)

## 2020-06-15 LAB — LIPID PANEL
Cholesterol: 303 mg/dL — ABNORMAL HIGH (ref 0–200)
HDL: 43.6 mg/dL (ref >39.00)
NonHDL: 259.87
Total CHOL/HDL Ratio: 7
Triglycerides: 383 mg/dL — ABNORMAL HIGH (ref 0.0–149.0)
VLDL: 76.6 mg/dL — ABNORMAL HIGH (ref 0.0–40.0)

## 2020-06-15 LAB — HEPATIC FUNCTION PANEL
ALT: 7 U/L (ref 0–35)
AST: 11 U/L (ref 0–37)
Albumin: 4 g/dL (ref 3.5–5.2)
Alkaline Phosphatase: 36 U/L — ABNORMAL LOW (ref 39–117)
Bilirubin, Direct: 0.1 mg/dL (ref 0.0–0.3)
Total Bilirubin: 0.4 mg/dL (ref 0.2–1.2)
Total Protein: 6.8 g/dL (ref 6.0–8.3)

## 2020-06-15 LAB — CBC WITH DIFFERENTIAL/PLATELET
Basophils Absolute: 0 10*3/uL (ref 0.0–0.1)
Basophils Relative: 0.5 % (ref 0.0–3.0)
Eosinophils Absolute: 0.2 10*3/uL (ref 0.0–0.7)
Eosinophils Relative: 2.1 % (ref 0.0–5.0)
HCT: 33.1 % — ABNORMAL LOW (ref 36.0–46.0)
Hemoglobin: 11.3 g/dL — ABNORMAL LOW (ref 12.0–15.0)
Lymphocytes Relative: 24.2 % (ref 12.0–46.0)
Lymphs Abs: 2 10*3/uL (ref 0.7–4.0)
MCHC: 34 g/dL (ref 30.0–36.0)
MCV: 87.3 fl (ref 78.0–100.0)
Monocytes Absolute: 0.6 10*3/uL (ref 0.1–1.0)
Monocytes Relative: 7.3 % (ref 3.0–12.0)
Neutro Abs: 5.4 10*3/uL (ref 1.4–7.7)
Neutrophils Relative %: 65.9 % (ref 43.0–77.0)
Platelets: 238 10*3/uL (ref 150.0–400.0)
RBC: 3.8 Mil/uL — ABNORMAL LOW (ref 3.87–5.11)
RDW: 13.4 % (ref 11.5–15.5)
WBC: 8.3 10*3/uL (ref 4.0–10.5)

## 2020-06-15 LAB — BASIC METABOLIC PANEL
BUN: 33 mg/dL — ABNORMAL HIGH (ref 6–23)
CO2: 26 mEq/L (ref 19–32)
Calcium: 9.5 mg/dL (ref 8.4–10.5)
Chloride: 101 mEq/L (ref 96–112)
Creatinine, Ser: 1.35 mg/dL — ABNORMAL HIGH (ref 0.40–1.20)
GFR: 34.02 mL/min — ABNORMAL LOW (ref >60.00)
Glucose, Bld: 148 mg/dL — ABNORMAL HIGH (ref 70–99)
Potassium: 4.5 mEq/L (ref 3.5–5.1)
Sodium: 136 mEq/L (ref 135–145)

## 2020-06-15 LAB — HEMOGLOBIN A1C: Hgb A1c MFr Bld: 7.2 % — ABNORMAL HIGH (ref 4.6–6.5)

## 2020-06-15 LAB — LDL CHOLESTEROL, DIRECT: Direct LDL: 137 mg/dL

## 2020-06-15 NOTE — Progress Notes (Signed)
Subjective:     Patient ID: Mikayla [name], female   DOB: July 14, 1925     MRN: [id number]  Brief patient profile:  10  yowf never smoker with moderate obesity> adult onset diabetes dx 2002 and hyperlipidemia and hbp/ asthma       History of Present Illness  12/18/2017  f/u ov/Tavi Hoogendoorn re:  HBP/ hyperlipidemia/ / dm  Chief Complaint  Patient presents with  . Follow-up   Dyspnea:  Not limited by breathing from desired activities  But more sluggish on bid bisoprolol Cough: none  Sleeping: fine flat Not checking bp or CBG's between ov's and not interested at all in doing so > no overt symptoms of high or low sugars  rec You need your drug formulary from insurance when you return  Change bisorpolol to 5 mg one daily  Add amlopdine 5 mg one daily   Admit date: 05/25/2019 Discharge date: 05/26/2019  Recommendations for Outpatient Follow-up:  1. PCP in 1 week, please check Bmet at FU 2. ARB discontinued, norvasc dose increased    Discharge Diagnoses:  Principal Problem:   Dyspnea    Diabetes mellitus type 2 in obese (HCC)   Hyperlipidemia associated with type 2 diabetes mellitus ([place])   Essential hypertension   Hypothyroidism, acquired   Chronic renal disease, stage III   Hyperkalemia   AKI (acute kidney injury) ([place])   History of present illness:  87 year old female with history of asthma, CKD stage III, type 2 diabetes mellitus, hypertension, dyslipidemia presented to the emergency room with acute onset dyspnea overnight.  -Work-up in the ED noted mild worsening creatinine to 1.7, potassium of 6.0, high-sensitivity troponin was 6 x2, D-dimer was 1.1, chest x-ray was clear  Hospital Course:   AKI/Hyperkalemia,on CKD 3 -likely due to ARB/lasix use -ARB stopped, treated with calcium gluconate, Lokelma -resolved at discharge -Clinically appears euvolemic    Subacute dyspnea -Etiology is unclear at this time, appears to have improved -EKG without considerable  changes and high-sensitivity troponin negative x2 -Does not have hypoxia at this time, chest x-ray is clear,lungs are clear, no edema -Covid PCR is negative,follow-up echo -D-dimer is mildly elevated, but no symptoms of PE, dopplers negative, ECHO noted LVH, EF 60-65%, asymmetrical hypertrophy of the basal septum, no significant valvular disease, diastolic parameters were indeterminate. -Patient ambulated down the halls with physical therapy, her O2 sats remained in the mid 90s without subjective sensation of dyspnea -Low-dose Lasix resumed -Asthma felt to be stable, no wheezing noted    Type 2 diabetes mellitus -resumed Metformin, patient declines sliding scale insulin  Hypertension -Stable, continue bisoprolol, amlodipine          07/22/2019  Post hosp f/u  ov/Mikayla [name] re: got both vaccines for covid 19 Chief Complaint  Patient presents with  . Follow-up    Doing well and denies any co's today. She is scheduled to see Nephrology 08/12/19.   Dyspnea:  Not limited by breathing from desired activities   Cough: none  Sleeping: bed is flat / r side/ 2 pillows  SABA use: none 02: none rec No change   12/30/2019  f/u ov/Mikayla [name] re: hbp/hyperlipidemia/ dm  Chief Complaint  Patient presents with  . Follow-up    no compliants  Dyspnea:  Not limited by breathing from desired activities   -avoids steps due to knees  Cough: none  Sleeping: bed is flat/ 2 pillows  SABA use: none  02: none  Refuses to check cbgs  rec Please remember to go to  the lab department   for your tests - we will call you with the results when they are available.  See [name] in 3 months with all your medications,  Without this process, it simply isn't possible to assure that we are providing  your outpatient care  with  the attention to detail we feel you deserve.   If we cannot assure that you're getting that kind of care,  then we cannot manage your problem effectively from this clinic. Once you have seen  Tammy and we are sure that we're all on the same page with your medication use she will arrange follow up with me.  Add :  Repeat bmet/ cbc/ hgbA1c ? Need to increase glucophage?    06/15/2020  f/u ov/Mikayla [name] re: HBP / hyperlipidemia/ dm  Chief Complaint  Patient presents with  . Follow-up    Doing well and no new co's today. She is unsure about getting her covid booster.   Dyspnea:  Not limited by breathing from desired activities  Cough: none Sleeping: flat bed/ 2 pillows  SABA use: none  02: none    No obvious day to day or daytime variability or assoc excess/ purulent sputum or mucus plugs or hemoptysis or cp or chest tightness, subjective wheeze or overt sinus or hb symptoms.   Sleeping  without nocturnal  or early am exacerbation  of respiratory  c/o's or need for noct saba. Also denies any obvious fluctuation of symptoms with weather or environmental changes or other aggravating or alleviating factors except as outlined above   No unusual exposure hx or h/o childhood pna/ asthma or knowledge of premature birth.  Current Allergies, Complete Past Medical History, Past Surgical History, Family History, and Social History were reviewed in [employer] record.  ROS  The following are not active complaints unless bolded Hoarseness, sore throat, dysphagia, dental problems, itching, sneezing,  nasal congestion or discharge of excess mucus or purulent secretions, ear ache,   fever, chills, sweats, unintended wt loss or wt gain, classically pleuritic or exertional cp,  orthopnea pnd or arm/hand swelling  or leg swelling, presyncope, palpitations, abdominal pain, anorexia, nausea, vomiting, diarrhea  or change in bowel habits or change in bladder habits, change in stools or change in urine, dysuria, hematuria,  rash, arthralgias, visual complaints, headache, numbness, weakness or ataxia or problems with walking or coordination,  change in mood or  Memory. Hearing loss          Current Meds  Medication Sig  . acetaminophen (TYLENOL) 325 MG tablet Take 325 mg by mouth every 6 (six) hours as needed for mild pain or fever.   Marland Kitchen amLODipine (NORVASC) 10 MG tablet TAKE 1 TABLET BY MOUTH DAILY  . aspirin 81 MG tablet Take 81 mg by mouth daily.  . bisoprolol (ZEBETA) 5 MG tablet Take 5 mg by mouth daily.  . budesonide-formoterol (SYMBICORT) 80-4.5 MCG/ACT inhaler Inhale 2 puffs into the lungs 2 (two) times daily as needed (shortness of breath/wheezing).  . calcium carbonate (TUMS EX) 750 MG chewable tablet Chew 1 tablet by mouth 3 (three) times daily with meals.  . Cholecalciferol (VITAMIN D) 1000 UNITS capsule Take 1,000 Units by mouth daily.  . cyclobenzaprine (FLEXERIL) 10 MG tablet Take 1 tablet (10 mg total) by mouth 3 (three) times daily as needed for muscle spasms.  Marland Kitchen dextromethorphan (DELSYM) 30 MG/5ML liquid Take 60 mg by mouth every 12 (twelve) hours as needed for cough.   . dorzolamide-timolol (COSOPT)  22.3-6.8 MG/ML ophthalmic solution Place 1 drop into both eyes 2 (two) times daily.   . famotidine (PEPCID) 20 MG tablet Take 20 mg by mouth at bedtime as needed for heartburn (COUGH FLARE).   . furosemide (LASIX) 20 MG tablet TAKE 1 TABLET BY MOUTH DAILY.  Marland Kitchen Homeopathic Products ([employer]S LEG CRAMPS PAIN RELIEF PO) Take 1 tablet by mouth as needed (for leg cramps).   Marland Kitchen levothyroxine (SYNTHROID) 50 MCG tablet TAKE 1 TABLET BY MOUTH DAILY BEFORE BREAKFAST.  Marland Kitchen loratadine (CLARITIN) 10 MG tablet Take 10 mg by mouth daily as needed (DRIPPY NOSE/WATERY EYE/INTIATIVE).  Marland Kitchen metFORMIN (GLUCOPHAGE) 500 MG tablet Take 1 tablet (500 mg total) by mouth 2 (two) times daily with a meal.  . Multiple Vitamins-Minerals (CENTRUM SILVER PO) Take 1 tablet by mouth daily.  . Multiple Vitamins-Minerals (ICAPS PO) Take 1 tablet by mouth daily.   Marland Kitchen omeprazole (PRILOSEC) 40 MG capsule TAKE 1 CAPSULE BY MOUTH DAILY.                        Past Medical History:  ESSENTIAL HYPERTENSION,  BENIGN (ICD-401.1)  Glaucoma................................................................................[name]  HYPERLIPIDEMIA (ICD-272.4)  - target LDL less than 70 because of diabetes  -Zocor 20mg  every other day at bedtime April 14, 2010>>> LDL 110 July 06, 2010 and no recurrent  cramps  RHINITIS (ICD-472.0)  Intermittent asthma  - exac by Timolol but tolerates unless uri/flare  OSTEOPENIA (ICD-733.90)  - DEXA 12/28/07 PA Spine .655, L Hip -1.033, Lfem Neck -1.793  - DEXA 02/23/10 1.7 L Fem -1.9 Right Fem -1.8  OBESITY (ICD-278.00)  - Target wt = 148 for BMI < 30  AODM (ICD-250.00)  Carotid stenosis   By u/s 06/18/19 x 60-79% on L , R with 1-39%  Health Maintenance.........................................................................Marland KitchenWert  - Td  12/05/16   - Pneumovax 04/2013 /  prevnar 03/03/14 - CPX 03/03/2014  - GYN Health Maint : [facility] [name], declines mammography and colonoscopy  --med calendar 06/04/2012 , 08/28/2013 , 04/20/14 , 11/23/2015        Objective:   Physical Exam      06/15/2020     136 12/30/2019  140  07/22/2019    140  05/20/2019   140  wt 159 November 05, 2008 >162 April 13, 2010 > 159 July 06, 2010 > 151 10/11/2010 >150 12/01/2010 > 150 01/11/2011 > 04/27/2011  148> 148 07/31/2011 > 10/30/2011  148 > 01/31/2012  147 >143 06/04/2012 > 09/23/2012  149> 01/01/13  142 > 04/03/2013  140> 03/03/2014 141 >145 03/17/2014 >>142 04/20/2014 > 05/11/2014 >141 >141 06/11/14 > 09/22/2014 142 > 12/29/2014 139 > 04/20/2015  144 > 06/11/2015  142 >  08/24/2015  143 > 02/01/2016   144 > 05/02/2016  142 > 03/13/2017  139 >  06/26/2017  144 >   04/30/2018  139     Vital signs reviewed  06/15/2020  - Note at rest 02 sats  97% on RA   General appearance:    amb wf nad     HEENT : pt wearing mask not removed for exam due to covid -19 concerns.    NECK :  without JVD/Nodes/TM/ nl carotid upstrokes bilaterally   LUNGS: no acc muscle use,  Nl contour chest which is clear to A and  P bilaterally without cough on insp or exp maneuvers   CV:  RRR  no s3 or murmur or increase in P2, and no edema   ABD:  soft and nontender  with nl inspiratory excursion in the supine position. No bruits or organomegaly appreciated, bowel sounds nl  MS:  Nl gait/ ext warm without deformities, calf tenderness, cyanosis or clubbing No obvious joint restrictions   SKIN: warm and dry without lesions    NEURO:  alert, approp, nl sensorium with  no motor or cerebellar deficits apparent.          Labs ordered/ reviewed:      Chemistry      Component Value Date/Time   NA 136 06/15/2020 1053   K 4.5 06/15/2020 1053   CL 101 06/15/2020 1053   CO2 26 06/15/2020 1053   BUN 33 (H) 06/15/2020 1053   CREATININE 1.35 (H) 06/15/2020 1053      Component Value Date/Time   CALCIUM 9.5 06/15/2020 1053   ALKPHOS 36 (L) 06/15/2020 1053   AST 11 06/15/2020 1053   ALT 7 06/15/2020 1053   BILITOT 0.4 06/15/2020 1053        Lab Results  Component Value Date   WBC 8.3 06/15/2020   HGB 11.3 (L) 06/15/2020   HCT 33.1 (L) 06/15/2020   MCV 87.3 06/15/2020   PLT 238.0 06/15/2020      Lab Results  Component Value Date   TSH 2.68 06/15/2020

## 2020-06-15 NOTE — Patient Instructions (Signed)
Please remember to go to the lab department   for your tests - we will call you with the results when they are available.       Please schedule a follow up visit in 6 months but call sooner if needed  

## 2020-06-16 ENCOUNTER — Encounter: Payer: Self-pay | Admitting: Internal Medicine

## 2020-06-16 NOTE — Assessment & Plan Note (Signed)
-  09/22/2014 p extensive coaching HFA effectiveness =    50%   All goals of chronic asthma control met including optimal function and elimination of symptoms with minimal need for rescue therapy.  Contingencies discussed in full including contacting this office immediately if not controlling the symptoms using the rule of two's.

## 2020-06-16 NOTE — Assessment & Plan Note (Signed)
-   Target LDL < 100 since has DM but poor statin tolerance   Lab Results  Component Value Date   CHOL 303 (H) 06/15/2020   HDL 43.60 06/15/2020   LDLCALC 125 (H) 03/13/2017   LDLDIRECT 137.0 06/15/2020   TRIG 383.0 (H) 06/15/2020   CHOLHDL 7 06/15/2020     Adequate control on present rx, reviewed in detail with pt > no change in rx needed

## 2020-06-16 NOTE — Assessment & Plan Note (Signed)
Lab Results  Component Value Date   CREATININE 1.35 (H) 06/15/2020   CREATININE 1.34 (H) 12/30/2019   CREATININE 1.45 (H) 07/22/2019    Adequate control on present rx, reviewed in detail with pt > no change in rx needed           Each maintenance medication was reviewed in detail including emphasizing most importantly the difference between maintenance and prns and under what circumstances the prns are to be triggered using an action plan format where appropriate.  Total time for H and P, chart review, counseling, reviewing   and generating customized AVS unique to this office visit / same day charting = 22 min

## 2020-06-16 NOTE — Assessment & Plan Note (Signed)
Lab Results  Component Value Date   HGBA1C 7.2 (H) 06/15/2020   HGBA1C 7.1 (H) 12/30/2019   HGBA1C 6.9 (H) 05/20/2019     Very slow trending up, consider adding glyburide but continue diet / ex for now

## 2020-06-16 NOTE — Assessment & Plan Note (Signed)
06/26/17 rx  synthroid 25 mcg per day with tsh  7.38  - 05/20/2019  TSH 1.19 on synthroid 50 mcg per day   Adequate control on present rx, reviewed in detail with pt > no change in rx needed  = synthroid 50 mcg daily

## 2020-06-17 ENCOUNTER — Encounter: Payer: Self-pay | Admitting: *Deleted

## 2020-06-30 ENCOUNTER — Other Ambulatory Visit: Payer: Self-pay | Admitting: Internal Medicine

## 2020-08-20 DIAGNOSIS — H401131 Primary open-angle glaucoma, bilateral, mild stage: Secondary | ICD-10-CM | POA: Diagnosis not present

## 2020-08-20 DIAGNOSIS — H353221 Exudative age-related macular degeneration, left eye, with active choroidal neovascularization: Secondary | ICD-10-CM | POA: Diagnosis not present

## 2020-08-20 DIAGNOSIS — Z961 Presence of intraocular lens: Secondary | ICD-10-CM | POA: Diagnosis not present

## 2020-08-20 DIAGNOSIS — H26492 Other secondary cataract, left eye: Secondary | ICD-10-CM | POA: Diagnosis not present

## 2020-10-01 ENCOUNTER — Other Ambulatory Visit: Payer: Self-pay | Admitting: Internal Medicine

## 2020-10-01 DIAGNOSIS — I10 Essential (primary) hypertension: Secondary | ICD-10-CM

## 2020-10-24 IMAGING — DX DG CHEST 2V
2 series · 2 of 2 positions shown · non-contrast
Comparison: March 03, 2014

CLINICAL DATA: Shortness of breath

EXAM:
CHEST - 2 VIEW

[chest pa]
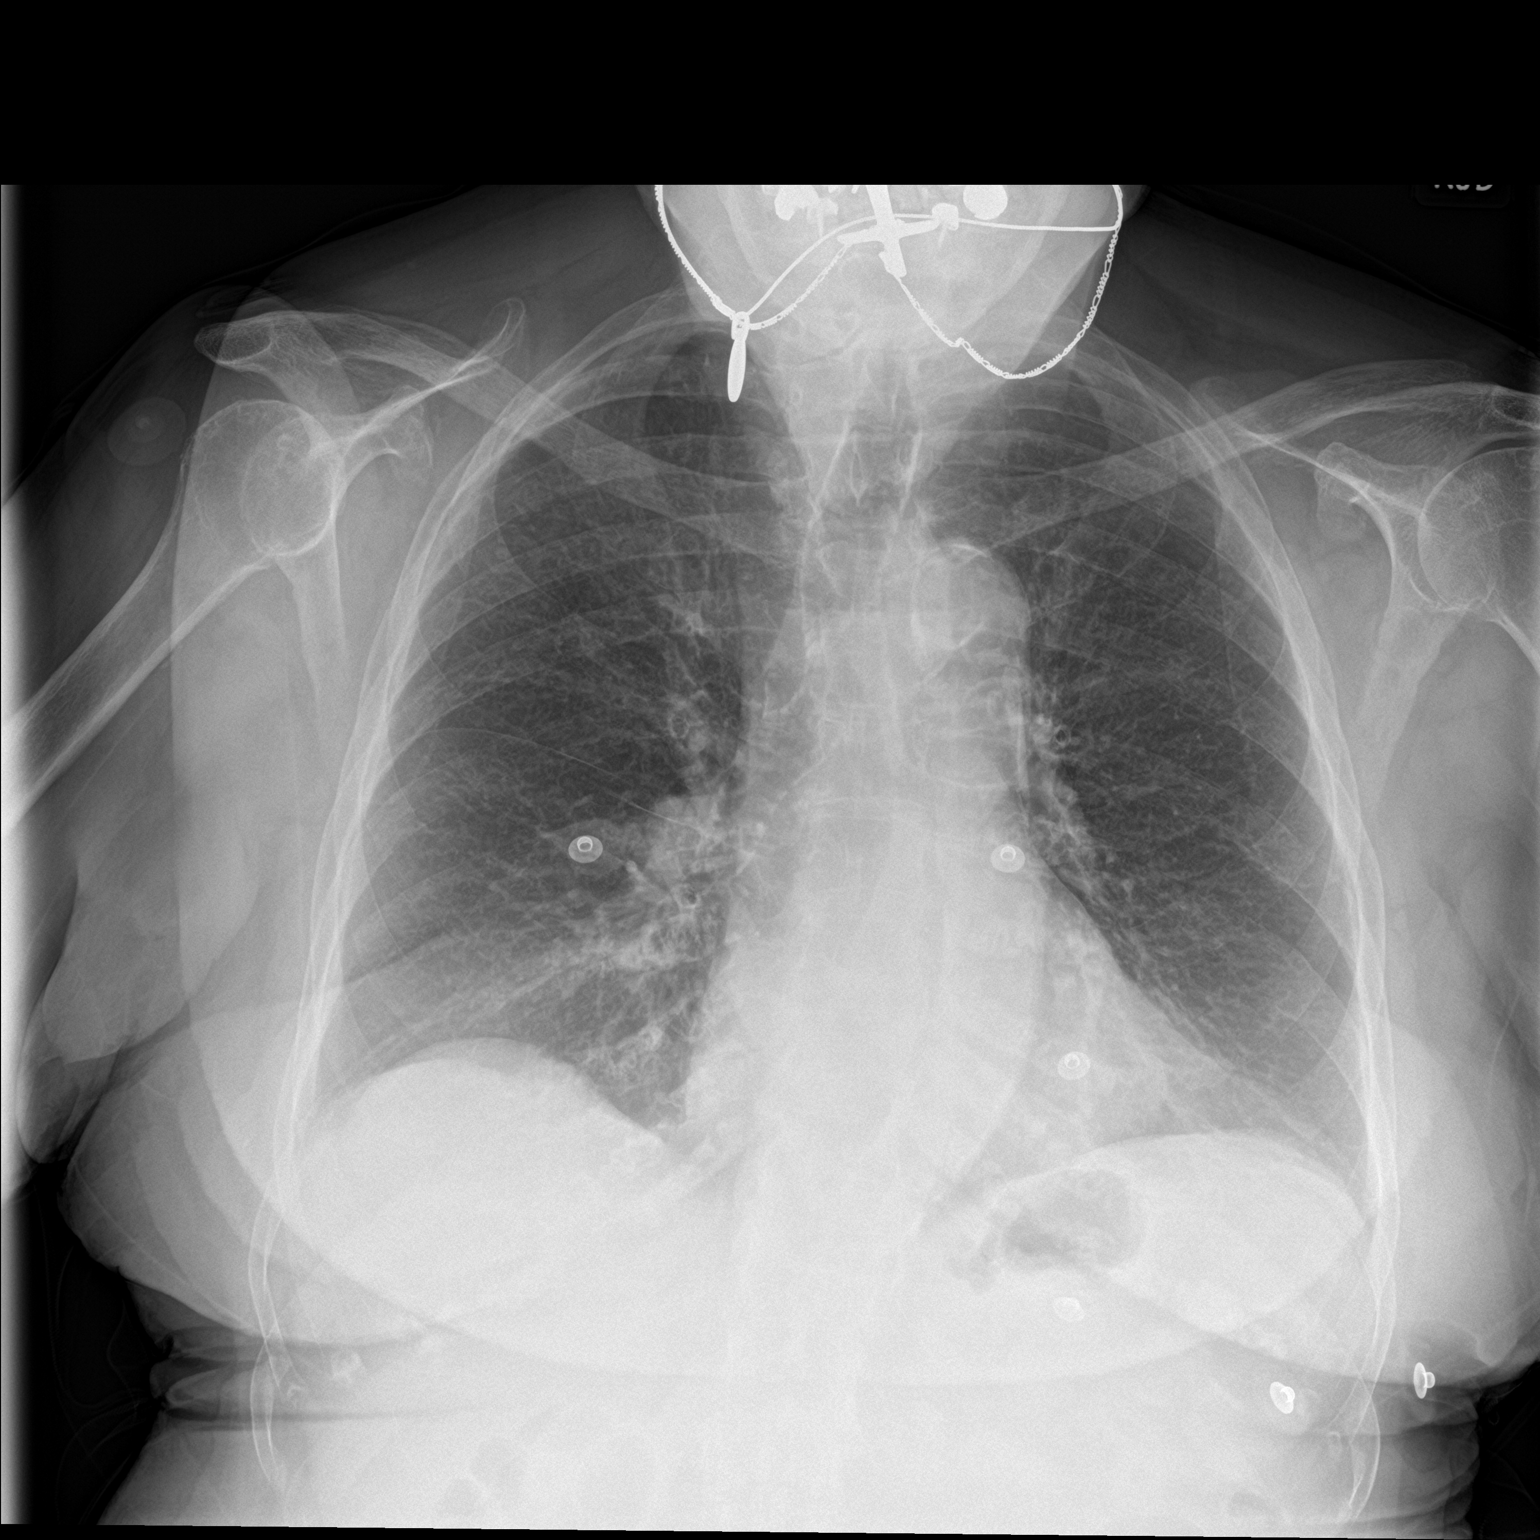

[chest lat]
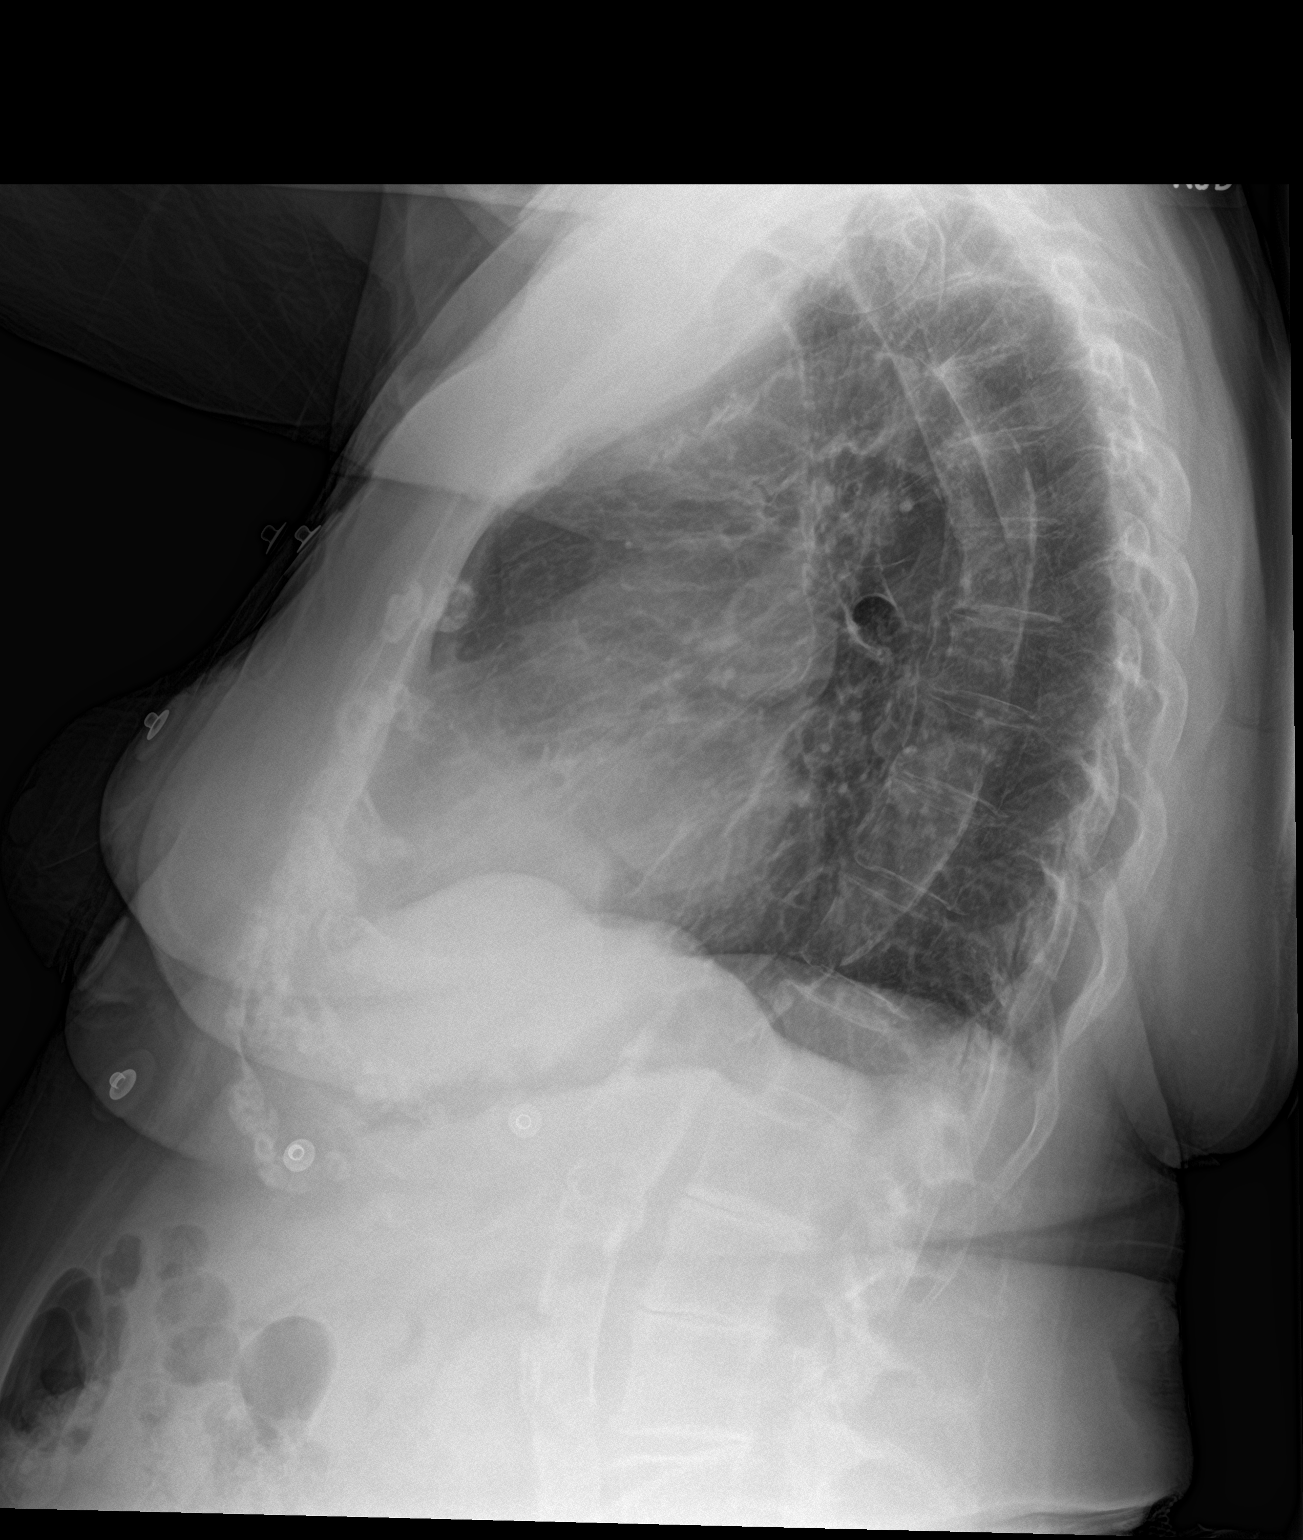

[2 of 2 positions shown; findings below may reference images not displayed]

FINDINGS: The heart size and mediastinal contours are within normal limits.
Aortic knob calcifications. Both lungs are clear. No acute osseous
abnormality.
IMPRESSION: No active cardiopulmonary disease.

## 2020-11-25 DIAGNOSIS — H401131 Primary open-angle glaucoma, bilateral, mild stage: Secondary | ICD-10-CM | POA: Diagnosis not present

## 2020-11-25 DIAGNOSIS — H26492 Other secondary cataract, left eye: Secondary | ICD-10-CM | POA: Diagnosis not present

## 2020-12-16 ENCOUNTER — Other Ambulatory Visit: Payer: Self-pay

## 2020-12-16 ENCOUNTER — Ambulatory Visit (INDEPENDENT_AMBULATORY_CARE_PROVIDER_SITE_OTHER): Payer: Medicare Other | Admitting: Adult Health

## 2020-12-16 ENCOUNTER — Encounter: Payer: Self-pay | Admitting: Adult Health

## 2020-12-16 VITALS — BP 130/62 | HR 63 | Temp 98.2°F | Ht <= 58 in | Wt 133.0 lb

## 2020-12-16 DIAGNOSIS — E1169 Type 2 diabetes mellitus with other specified complication: Secondary | ICD-10-CM | POA: Diagnosis not present

## 2020-12-16 DIAGNOSIS — D649 Anemia, unspecified: Secondary | ICD-10-CM | POA: Diagnosis not present

## 2020-12-16 DIAGNOSIS — I10 Essential (primary) hypertension: Secondary | ICD-10-CM

## 2020-12-16 DIAGNOSIS — J4520 Mild intermittent asthma, uncomplicated: Secondary | ICD-10-CM | POA: Diagnosis not present

## 2020-12-16 DIAGNOSIS — E669 Obesity, unspecified: Secondary | ICD-10-CM

## 2020-12-16 DIAGNOSIS — E039 Hypothyroidism, unspecified: Secondary | ICD-10-CM

## 2020-12-16 DIAGNOSIS — E785 Hyperlipidemia, unspecified: Secondary | ICD-10-CM

## 2020-12-16 DIAGNOSIS — N1832 Chronic kidney disease, stage 3b: Secondary | ICD-10-CM | POA: Diagnosis not present

## 2020-12-16 LAB — BASIC METABOLIC PANEL
BUN: 33 mg/dL — ABNORMAL HIGH (ref 6–23)
CO2: 26 mEq/L (ref 19–32)
Calcium: 9.4 mg/dL (ref 8.4–10.5)
Chloride: 102 mEq/L (ref 96–112)
Creatinine, Ser: 1.35 mg/dL — ABNORMAL HIGH (ref 0.40–1.20)
GFR: 33.9 mL/min — ABNORMAL LOW (ref >60.00)
Glucose, Bld: 142 mg/dL — ABNORMAL HIGH (ref 70–99)
Potassium: 4.7 mEq/L (ref 3.5–5.1)
Sodium: 137 mEq/L (ref 135–145)

## 2020-12-16 LAB — HEMOGLOBIN A1C: Hgb A1c MFr Bld: 6.8 % — ABNORMAL HIGH (ref 4.6–6.5)

## 2020-12-16 MED ORDER — BUDESONIDE-FORMOTEROL FUMARATE 80-4.5 MCG/ACT IN AERO: 2.0000 | INHALATION_SPRAY | Freq: Two times a day (BID) | RESPIRATORY_TRACT | 5 refills | Status: DC | PRN

## 2020-12-16 NOTE — Assessment & Plan Note (Signed)
Controlled on current regimen.   

## 2020-12-16 NOTE — Assessment & Plan Note (Signed)
Stable continue to use Symbicort if needed.

## 2020-12-16 NOTE — Assessment & Plan Note (Signed)
TSH normal in February.  Continue on current dosing of Synthroid

## 2020-12-16 NOTE — Assessment & Plan Note (Signed)
Stable

## 2020-12-16 NOTE — Assessment & Plan Note (Signed)
A1c was slightly elevated at last check.  Repeat labs today.  Patient education on diabetes and dietary management.  Patient also does not take her second dose of metformin occasionally.  Have advised her to take metformin twice daily as prescribed.

## 2020-12-16 NOTE — Assessment & Plan Note (Signed)
Hyperlipidemia we went over low cholesterol and fat diet.  Patient's cholesterol is not at goal.  Patient declines statin therapy.

## 2020-12-16 NOTE — Progress Notes (Signed)
$'@Patient'h$  ID: Mikayla [name], female    DOB: 07-10-1925, 87 y.o.   MRN: [id number]  Chief Complaint  Patient presents with   Follow-up    Referring provider: Tanda Rockers, MD  HPI: 87 year old female never smoker followed for diabetes, hypertension, hyperlipidemia, chronic kidney disease and mild intermittent asthma.  She is a primary care patient of Dr. Melvyn Novas   TEST/EVENTS :   12/16/2020 Follow up : HTN, DM , Asthma, Hyperlipidemia  Patient presents for a 80-monthfollow-up.  Patient has underlying hypertension, diabetes and hyperlipidemia, chronic kidney disease stage IIIb and mild intermittent asthma. Patient says overall she is doing well , no new complaints except vision and hearing are getting worse with age.  She remains on a low-fat low-cholesterol diet.  She declines statin therapy.  Cholesterol remains elevated with total cholesterol 303 and triglycerides at 383.  Patient education on low-cholesterol diet Patient remains on metformin for her diabetes.  A1c was slightly increased last visit at 7.2 (up from 7.1).  Chronic kidney disease with serum creatinine stable at last lab check serum creatinine was 1.35. Patient activity level remains at her baseline.  She is had no decrease in her activity tolerance.  Blood pressure has been stable.  She lives in independent living.  She is able to do some light housework.  She does require a driver to come to appointments and shopping.  [place]   She has received her COVID-vaccine. We discussed Booster.  Pneumovax is up-to-date, Tdap is up-to-date    Allergies  Allergen Reactions   Amoxicillin Rash   Lipitor [Atorvastatin]     REACTION: aches    Immunization History  Administered Date(s) Administered   Fluad Quad(high Dose 65+) 02/25/2019, 04/13/2020   Influenza Split 01/25/2011, 01/31/2012   Influenza Whole 02/28/2007, 04/02/2008, 03/12/2009, 02/10/2010    Influenza, High Dose Seasonal PF 02/01/2016, 03/13/2017, 01/29/2018   Influenza,inj,Quad PF,6+ Mos 04/02/2013, 03/03/2014, 04/20/2015   PFIZER(Purple Top)SARS-COV-2 Vaccination 06/19/2019, 07/14/2019   Pneumococcal Conjugate-13 03/03/2014   Pneumococcal Polysaccharide-23 06/04/2012   Pneumococcal-Unspecified 09/12/2000   Td 08/13/2006   Tdap 12/05/2016    Past Medical History:  Diagnosis Date   Chronic rhinitis    CKD (chronic kidney disease), stage III (HCC)    Disorder of bone and cartilage, unspecified    Essential hypertension, benign    Obesity, unspecified    Other and unspecified hyperlipidemia    Type II or unspecified type diabetes mellitus without mention of complication, not stated as uncontrolled     Tobacco History: Social History   Tobacco Use  Smoking Status Never  Smokeless Tobacco Never   Counseling given: Not Answered   Outpatient Medications Prior to Visit  Medication Sig Dispense Refill   acetaminophen (TYLENOL) 325 MG tablet Take 325 mg by mouth every 6 (six) hours as needed for mild pain or fever.      amLODipine (NORVASC) 10 MG tablet TAKE 1 TABLET BY MOUTH DAILY 90 tablet 1   aspirin 81 MG tablet Take 81 mg by mouth daily.     bisoprolol (ZEBETA) 5 MG tablet TAKE 1 TABLET BY MOUTH TWICE A DAY 180 tablet 11   calcium carbonate (TUMS EX) 750 MG chewable tablet Chew 1 tablet by mouth 3 (three) times daily with meals.     Cholecalciferol (VITAMIN D) 1000 UNITS capsule Take 1,000 Units by mouth daily.     cyclobenzaprine (FLEXERIL) 10 MG tablet Take 1 tablet (10  mg total) by mouth 3 (three) times daily as needed for muscle spasms. 30 tablet 1   dextromethorphan (DELSYM) 30 MG/5ML liquid Take 60 mg by mouth every 12 (twelve) hours as needed for cough.      dorzolamide-timolol (COSOPT) 22.3-6.8 MG/ML ophthalmic solution Place 1 drop into both eyes 2 (two) times daily.      famotidine (PEPCID) 20 MG tablet Take 20 mg by mouth at bedtime as needed for  heartburn (COUGH FLARE).      furosemide (LASIX) 20 MG tablet TAKE 1 TABLET BY MOUTH DAILY. 90 tablet 3   Homeopathic Products ([employer] LEG CRAMPS PAIN RELIEF PO) Take 1 tablet by mouth as needed (for leg cramps).      levothyroxine (SYNTHROID) 50 MCG tablet TAKE 1 TABLET BY MOUTH DAILY BEFORE BREAKFAST. 90 tablet 3   loratadine (CLARITIN) 10 MG tablet Take 10 mg by mouth daily as needed (DRIPPY NOSE/WATERY EYE/INTIATIVE).     metFORMIN (GLUCOPHAGE) 500 MG tablet TAKE 1 TABLET BY MOUTH 2 TIMES DAILY WITH A MEAL. 180 tablet 3   Multiple Vitamins-Minerals (CENTRUM SILVER PO) Take 1 tablet by mouth daily.     Multiple Vitamins-Minerals (ICAPS PO) Take 1 tablet by mouth daily.      omeprazole (PRILOSEC) 40 MG capsule TAKE 1 CAPSULE BY MOUTH DAILY. 30 capsule 1   budesonide-formoterol (SYMBICORT) 80-4.5 MCG/ACT inhaler Inhale 2 puffs into the lungs 2 (two) times daily as needed (shortness of breath/wheezing). 1 Inhaler 0   No facility-administered medications prior to visit.     Review of Systems:   Constitutional:   No  weight loss, night sweats,  Fevers, chills, fatigue, or  lassitude.  HEENT:   No headaches,  Difficulty swallowing,  Tooth/dental problems, or  Sore throat,                No sneezing, itching, ear ache, nasal congestion, post nasal drip,  +HOH   CV:  No chest pain,  Orthopnea, PND,  , anasarca, dizziness, palpitations, syncope.   GI  No heartburn, indigestion, abdominal pain, nausea, vomiting, diarrhea, change in bowel habits, loss of appetite, bloody stools.   Resp: No shortness of breath with exertion or at rest.  No excess mucus, no productive cough,  No non-productive cough,  No coughing up of blood.  No change in color of mucus.  No wheezing.  No chest wall deformity  Skin: no rash or lesions.  GU: no dysuria, change in color of urine, no urgency or frequency.  No flank pain, no hematuria   MS:  No joint pain or swelling.  No decreased range of motion.  No back  pain.    Physical Exam  BP 130/62 (BP Location: Left Arm, Patient Position: Sitting, Cuff Size: Normal)   Pulse 63   Temp 98.2 F (36.8 C) (Oral)   Ht '4\' 9"'$  (1.448 m)   Wt 133 lb (60.3 kg)   SpO2 98%   BMI 28.78 kg/m   GEN: A/Ox3; pleasant , NAD, well nourished    HEENT:  [place]/AT,    NOSE-clear, THROAT-clear, no lesions, no postnasal drip or exudate noted.   NECK:  Supple w/ fair ROM; no JVD; normal carotid impulses w/o bruits; no thyromegaly or nodules palpated; no lymphadenopathy.    RESP  Clear  P & A; w/o, wheezes/ rales/ or rhonchi. no accessory muscle use, no dullness to percussion  CARD:  RRR, no m/r/g, tr -1+ peripheral edema L>R pulses intact, no cyanosis or clubbing.  GI:  Soft & nt; nml bowel sounds; no organomegaly or masses detected.   Musco: Warm bil, no deformities or joint swelling noted.   Neuro: alert, no focal deficits noted.    Skin: Warm, no lesions or rashes    Lab Results:  CBC    Component Value Date/Time   WBC 8.3 06/15/2020 1053   RBC 3.80 (L) 06/15/2020 1053   HGB 11.3 (L) 06/15/2020 1053   HCT 33.1 (L) 06/15/2020 1053   PLT 238.0 06/15/2020 1053   MCV 87.3 06/15/2020 1053   MCH 29.8 05/26/2019 0250   MCHC 34.0 06/15/2020 1053   RDW 13.4 06/15/2020 1053   LYMPHSABS 2.0 06/15/2020 1053   MONOABS 0.6 06/15/2020 1053   EOSABS 0.2 06/15/2020 1053   BASOSABS 0.0 06/15/2020 1053    BMET    Component Value Date/Time   NA 136 06/15/2020 1053   K 4.5 06/15/2020 1053   CL 101 06/15/2020 1053   CO2 26 06/15/2020 1053   GLUCOSE 148 (H) 06/15/2020 1053   GLUCOSE 126 (H) 04/23/2006 1043   BUN 33 (H) 06/15/2020 1053   CREATININE 1.35 (H) 06/15/2020 1053   CALCIUM 9.5 06/15/2020 1053   GFRNONAA 31 (L) 05/26/2019 0250   GFRAA 36 (L) 05/26/2019 0250    BNP    Component Value Date/Time   BNP 117.5 (H) 05/25/2019 0245    ProBNP    Component Value Date/Time   PROBNP 234.0 (H) 07/22/2019 1045    Imaging: No results  found.    No flowsheet data found.  No results found for: NITRICOXIDE      Assessment & Plan:   No problem-specific Assessment & Plan notes found for this encounter.   I spent   42 minutes dedicated to the care of this patient on the date of this encounter to include pre-visit review of records, face-to-face time with the patient discussing conditions above, post visit ordering of testing, clinical documentation with the electronic health record, making appropriate referrals as documented, and communicating necessary findings to members of the patients care team.    Rexene Edison, NP 12/16/2020

## 2020-12-16 NOTE — Assessment & Plan Note (Signed)
Serum creatinine is stable.  Avoid nonsteroidals.  Bmet today.

## 2020-12-16 NOTE — Patient Instructions (Addendum)
Continue on current regimen Low-salt , sugar diet . Low cholesterol diet .  Activity as tolerated.  Labs today.  Follow-up in 6 months with Dr. Melvyn Novas  And As needed   with labs Please contact office for sooner follow up if symptoms do not improve or worsen or seek emergency care

## 2020-12-17 NOTE — Progress Notes (Signed)
I called the patient with the results and she voice understanding. No other concerns.

## 2021-01-20 ENCOUNTER — Other Ambulatory Visit: Payer: Self-pay | Admitting: Internal Medicine

## 2021-03-09 ENCOUNTER — Other Ambulatory Visit: Payer: Self-pay | Admitting: Internal Medicine

## 2021-04-04 ENCOUNTER — Other Ambulatory Visit: Payer: Self-pay | Admitting: Internal Medicine

## 2021-05-26 DIAGNOSIS — H26493 Other secondary cataract, bilateral: Secondary | ICD-10-CM | POA: Diagnosis not present

## 2021-05-26 DIAGNOSIS — Z961 Presence of intraocular lens: Secondary | ICD-10-CM | POA: Diagnosis not present

## 2021-05-26 DIAGNOSIS — H401131 Primary open-angle glaucoma, bilateral, mild stage: Secondary | ICD-10-CM | POA: Diagnosis not present

## 2021-06-14 DIAGNOSIS — H3582 Retinal ischemia: Secondary | ICD-10-CM | POA: Diagnosis not present

## 2021-06-14 DIAGNOSIS — H35033 Hypertensive retinopathy, bilateral: Secondary | ICD-10-CM | POA: Diagnosis not present

## 2021-06-14 DIAGNOSIS — E113313 Type 2 diabetes mellitus with moderate nonproliferative diabetic retinopathy with macular edema, bilateral: Secondary | ICD-10-CM | POA: Diagnosis not present

## 2021-06-14 DIAGNOSIS — H35373 Puckering of macula, bilateral: Secondary | ICD-10-CM | POA: Diagnosis not present

## 2021-06-14 DIAGNOSIS — H35363 Drusen (degenerative) of macula, bilateral: Secondary | ICD-10-CM | POA: Diagnosis not present

## 2021-06-14 DIAGNOSIS — Z961 Presence of intraocular lens: Secondary | ICD-10-CM | POA: Diagnosis not present

## 2021-06-21 ENCOUNTER — Other Ambulatory Visit: Payer: Self-pay

## 2021-06-21 ENCOUNTER — Ambulatory Visit (INDEPENDENT_AMBULATORY_CARE_PROVIDER_SITE_OTHER): Payer: Medicare Other | Admitting: Internal Medicine

## 2021-06-21 ENCOUNTER — Encounter: Payer: Self-pay | Admitting: Internal Medicine

## 2021-06-21 VITALS — BP 148/66 | Temp 98.2°F | Ht <= 58 in | Wt 132.0 lb

## 2021-06-21 DIAGNOSIS — E1169 Type 2 diabetes mellitus with other specified complication: Secondary | ICD-10-CM

## 2021-06-21 DIAGNOSIS — J4520 Mild intermittent asthma, uncomplicated: Secondary | ICD-10-CM

## 2021-06-21 DIAGNOSIS — Z23 Encounter for immunization: Secondary | ICD-10-CM

## 2021-06-21 DIAGNOSIS — E785 Hyperlipidemia, unspecified: Secondary | ICD-10-CM | POA: Diagnosis not present

## 2021-06-21 DIAGNOSIS — E669 Obesity, unspecified: Secondary | ICD-10-CM | POA: Diagnosis not present

## 2021-06-21 LAB — HEPATIC FUNCTION PANEL
ALT: 8 U/L (ref 0–35)
AST: 12 U/L (ref 0–37)
Albumin: 4.1 g/dL (ref 3.5–5.2)
Alkaline Phosphatase: 34 U/L — ABNORMAL LOW (ref 39–117)
Bilirubin, Direct: 0 mg/dL (ref 0.0–0.3)
Total Bilirubin: 0.5 mg/dL (ref 0.2–1.2)
Total Protein: 7.1 g/dL (ref 6.0–8.3)

## 2021-06-21 LAB — BASIC METABOLIC PANEL
BUN: 43 mg/dL — ABNORMAL HIGH (ref 6–23)
CO2: 28 mEq/L (ref 19–32)
Calcium: 9.4 mg/dL (ref 8.4–10.5)
Chloride: 101 mEq/L (ref 96–112)
Creatinine, Ser: 1.42 mg/dL — ABNORMAL HIGH (ref 0.40–1.20)
GFR: 31.79 mL/min — ABNORMAL LOW (ref >60.00)
Glucose, Bld: 137 mg/dL — ABNORMAL HIGH (ref 70–99)
Potassium: 4.2 mEq/L (ref 3.5–5.1)
Sodium: 136 mEq/L (ref 135–145)

## 2021-06-21 LAB — CBC WITH DIFFERENTIAL/PLATELET
Basophils Absolute: 0 10*3/uL (ref 0.0–0.1)
Basophils Relative: 0.2 % (ref 0.0–3.0)
Eosinophils Absolute: 0.1 10*3/uL (ref 0.0–0.7)
Eosinophils Relative: 1.9 % (ref 0.0–5.0)
HCT: 33.6 % — ABNORMAL LOW (ref 36.0–46.0)
Hemoglobin: 11.2 g/dL — ABNORMAL LOW (ref 12.0–15.0)
Lymphocytes Relative: 23 % (ref 12.0–46.0)
Lymphs Abs: 1.8 10*3/uL (ref 0.7–4.0)
MCHC: 33.4 g/dL (ref 30.0–36.0)
MCV: 88.7 fl (ref 78.0–100.0)
Monocytes Absolute: 0.7 10*3/uL (ref 0.1–1.0)
Monocytes Relative: 8.2 % (ref 3.0–12.0)
Neutro Abs: 5.3 10*3/uL (ref 1.4–7.7)
Neutrophils Relative %: 66.7 % (ref 43.0–77.0)
Platelets: 211 10*3/uL (ref 150.0–400.0)
RBC: 3.79 Mil/uL — ABNORMAL LOW (ref 3.87–5.11)
RDW: 13.8 % (ref 11.5–15.5)
WBC: 8 10*3/uL (ref 4.0–10.5)

## 2021-06-21 LAB — LIPID PANEL
Cholesterol: 291 mg/dL — ABNORMAL HIGH (ref 0–200)
HDL: 44 mg/dL (ref >39.00)
NonHDL: 246.71
Total CHOL/HDL Ratio: 7
Triglycerides: 274 mg/dL — ABNORMAL HIGH (ref 0.0–149.0)
VLDL: 54.8 mg/dL — ABNORMAL HIGH (ref 0.0–40.0)

## 2021-06-21 LAB — LDL CHOLESTEROL, DIRECT: Direct LDL: 156 mg/dL

## 2021-06-21 LAB — TSH: TSH: 1.93 u[IU]/mL (ref 0.35–5.50)

## 2021-06-21 LAB — HEMOGLOBIN A1C: Hgb A1c MFr Bld: 6.8 % — ABNORMAL HIGH (ref 4.6–6.5)

## 2021-06-21 NOTE — Patient Instructions (Signed)
No change in medications   See calendar for specific medication instructions and bring it back for each and every office visit for every healthcare provider you see.  Without it,  you may not receive the best quality medical care that we feel you deserve.  You will note that the calendar groups together  your maintenance  medications that are timed at particular times of the day.  Think of this as your checklist for what your doctor has instructed you to do until your next evaluation to see what benefit  there is  to staying on a consistent group of medications intended to keep you well.  The other group at the bottom is entirely up to you to use as you see fit  for specific symptoms that may arise between visits that require you to treat them on an as needed basis.  Think of this as your action plan or "what if" list.   Separating the top medications from the bottom group is fundamental to providing you adequate care going forward.     Please remember to go to the lab department   for your tests - we will call you with the results when they are available.      I am referring to [facility].

## 2021-06-21 NOTE — Progress Notes (Signed)
Subjective:     Patient ID: Mikayla [name], female   DOB: 03-May-1926     MRN: [id number]  Brief patient profile:  24   yowf never smoker with moderate obesity> adult onset diabetes dx 2002 and hyperlipidemia and hbp/ asthma       History of Present Illness  12/18/2017  f/u ov/Mikayla [name] re:  HBP/ hyperlipidemia/ / dm  Chief Complaint  Patient presents with   Follow-up   Dyspnea:  Not limited by breathing from desired activities  But more sluggish on bid bisoprolol Cough: none  Sleeping: fine flat Not checking bp or CBG's between ov's and not interested at all in doing so > no overt symptoms of high or low sugars  rec You need your drug formulary from insurance when you return  Change bisorpolol to 5 mg one daily  Add amlopdine 5 mg one daily   Admit date: 05/25/2019 Discharge date: 05/26/2019   Recommendations for Outpatient Follow-up:  PCP in 1 week, please check Bmet at FU ARB discontinued, norvasc dose increased      Discharge Diagnoses:  Principal Problem:   Dyspnea    Diabetes mellitus type 2 in obese (HCC)   Hyperlipidemia associated with type 2 diabetes mellitus ([place])   Essential hypertension   Hypothyroidism, acquired   Chronic renal disease, stage III   Hyperkalemia   AKI (acute kidney injury) ([place])     History of present illness:  88 year old female with history of asthma, CKD stage III, type 2 diabetes mellitus, hypertension, dyslipidemia presented to the emergency room with acute onset dyspnea overnight.  -Work-up in the ED noted mild worsening creatinine to 1.7, potassium of  6.0, high-sensitivity troponin was 6 x2, D-dimer was 1.1, chest x-ray was clear   Hospital Course:    AKI/Hyperkalemia, on CKD 3 -likely due to ARB/lasix use -ARB stopped, treated with calcium gluconate, Lokelma -resolved at discharge -Clinically appears euvolemic     Subacute dyspnea -Etiology is unclear at this time, appears to have improved -EKG without considerable changes  and high-sensitivity troponin negative x2 -Does not have hypoxia at this time, chest x-ray is clear, lungs are clear, no edema -Covid PCR is negative, follow-up echo -D-dimer is mildly elevated, but no symptoms of PE, dopplers negative, ECHO noted LVH, EF 60-65%, asymmetrical hypertrophy of the basal septum, no significant valvular disease, diastolic parameters were indeterminate. -Patient ambulated down the halls with physical therapy, her O2 sats remained in the mid 90s without subjective sensation of dyspnea -Low-dose Lasix resumed -Asthma felt to be stable, no wheezing noted    Type 2 diabetes mellitus -resumed Metformin, patient declines sliding scale insulin   Hypertension -Stable, continue bisoprolol, amlodipine             12/30/2019  f/u ov/Mikayla [name] re: hbp/hyperlipidemia/ dm  Chief Complaint  Patient presents with   Follow-up    no compliants  Dyspnea:  Not limited by breathing from desired activities   -avoids steps due to knees  Cough: none  Sleeping: bed is flat/ 2 pillows  SABA use: none  02: none  Refuses to check cbgs  rec Please remember to go to the lab department   for your tests - we will call you with the results when they are available.  See Mikayla [name] in 3 months with all your medications,  Without this process, it simply isn't possible to assure that we are providing  your outpatient care  with  the attention to detail we feel you deserve.  If we cannot assure that you're getting that kind of care,  then we cannot manage your problem effectively from this clinic. Once you have seen Mikayla and we are sure that we're all on the same page with your medication use she will arrange follow up with me.  Add :  Repeat bmet/ cbc/ hgbA1c ? Need to increase glucophage?    06/15/2020  f/u ov/Mikayla [name] re: HBP / hyperlipidemia/ dm  Chief Complaint  Patient presents with   Follow-up    Doing well and no new co's today. She is unsure about getting her covid booster.    Dyspnea:  Not limited by breathing from desired activities  Cough: none Sleeping: flat bed/ 2 pillows   Rec No change rx   06/21/2021  f/u ov/Mikayla [name] re: dm/ hbp/hyperlipidemia   - no symptoms  Chief Complaint  Patient presents with   Follow-up    No new co's today.    Dyspnea:  Not limited by breathing from desired activities  l Cough: none Sleeping: ok flat  SABA use: none  02: none  Covid status:   vax x  one pfizer    No obvious day to day or daytime variability or assoc excess/ purulent sputum or mucus plugs or hemoptysis or cp or chest tightness, subjective wheeze or overt sinus or hb symptoms.   Sleeping  without nocturnal  or early am exacerbation  of respiratory  c/o's or need for noct saba. Also denies any obvious fluctuation of symptoms with weather or environmental changes or other aggravating or alleviating factors except as outlined above   No unusual exposure hx or h/o childhood pna/ asthma or knowledge of premature birth.  Current Allergies, Complete Past Medical History, Past Surgical History, Family History, and Social History were reviewed in [employer] record.  ROS  The following are not active complaints unless bolded Hoarseness, sore throat, dysphagia, dental problems, itching, sneezing,  nasal congestion or discharge of excess mucus or purulent secretions, ear ache,   fever, chills, sweats, unintended wt loss or wt gain, classically pleuritic or exertional cp,  orthopnea pnd or arm/hand swelling  or leg swelling, presyncope, palpitations, abdominal pain, anorexia, nausea, vomiting, diarrhea  or change in bowel habits or change in bladder habits, change in stools or change in urine, dysuria, hematuria,  rash, arthralgias, visual complaints, headache, numbness, weakness or ataxia or problems with walking or coordination,  change in mood or  memory.hearing loss         Current Meds  Medication Sig   acetaminophen (TYLENOL) 325 MG tablet Take  325 mg by mouth every 6 (six) hours as needed for mild pain or fever.    amLODipine (NORVASC) 10 MG tablet TAKE 1 TABLET BY MOUTH DAILY   aspirin 81 MG tablet Take 81 mg by mouth daily.   bisoprolol (ZEBETA) 5 MG tablet TAKE 1 TABLET BY MOUTH TWICE A DAY   budesonide-formoterol (SYMBICORT) 80-4.5 MCG/ACT inhaler Inhale 2 puffs into the lungs 2 (two) times daily as needed (shortness of breath/wheezing).   calcium carbonate (TUMS EX) 750 MG chewable tablet Chew 1 tablet by mouth 3 (three) times daily with meals.   Cholecalciferol (VITAMIN D) 1000 UNITS capsule Take 1,000 Units by mouth daily.   cyclobenzaprine (FLEXERIL) 10 MG tablet Take 1 tablet (10 mg total) by mouth 3 (three) times daily as needed for muscle spasms.   dextromethorphan (DELSYM) 30 MG/5ML liquid Take 60 mg by mouth every 12 (twelve) hours as needed for cough.  dorzolamide-timolol (COSOPT) 22.3-6.8 MG/ML ophthalmic solution Place 1 drop into both eyes 2 (two) times daily.    famotidine (PEPCID) 20 MG tablet Take 20 mg by mouth at bedtime as needed for heartburn (COUGH FLARE).    furosemide (LASIX) 20 MG tablet TAKE 1 TABLET BY MOUTH DAILY.   Homeopathic Products ([employer] LEG CRAMPS PAIN RELIEF PO) Take 1 tablet by mouth as needed (for leg cramps).    levothyroxine (SYNTHROID) 50 MCG tablet TAKE 1 TABLET BY MOUTH DAILY BEFORE BREAKFAST.   loratadine (CLARITIN) 10 MG tablet Take 10 mg by mouth daily as needed (DRIPPY NOSE/WATERY EYE/INTIATIVE).   metFORMIN (GLUCOPHAGE) 500 MG tablet TAKE 1 TABLET BY MOUTH 2 TIMES DAILY WITH A MEAL.   Multiple Vitamins-Minerals (CENTRUM SILVER PO) Take 1 tablet by mouth daily.   Multiple Vitamins-Minerals (ICAPS PO) Take 1 tablet by mouth daily.    omeprazole (PRILOSEC) 40 MG capsule TAKE 1 CAPSULE BY MOUTH DAILY.                       Past Medical History:  ESSENTIAL HYPERTENSION, BENIGN (ICD-401.1)   Glaucoma................................................................................[name]  HYPERLIPIDEMIA (ICD-272.4)  - target LDL less than 70 because of diabetes  -Zocor 20mg  every other day at bedtime April 14, 2010>>> LDL 110 July 06, 2010 and no recurrent  cramps  RHINITIS (ICD-472.0)  Intermittent asthma  - exac by Timolol but tolerates unless uri/flare  OSTEOPENIA (ICD-733.90)  - DEXA 12/28/07 PA Spine .655, L Hip -1.033, Lfem Neck -1.793  - DEXA 02/23/10 1.7 L Fem -1.9 Right Fem -1.8  OBESITY (ICD-278.00)  - Target wt = 148 for BMI < 30  AODM (ICD-250.00)  Carotid stenosis   By u/s 06/18/19 x 60-79% on L , R with 1-39%  Health [facility]....................................................................[facility]  - Td  12/05/16   - Pneumovax 04/2013 /  prevnar 03/03/14 - CPX 03/03/2014  - GYN Health Maint : [facility] [name], declines mammography and colonoscopy           Objective:   Physical Exam     06/21/2021   132  06/15/2020    136 12/30/2019  140  07/22/2019    140  05/20/2019   140  wt 159 November 05, 2008 >162 April 13, 2010 > 159 July 06, 2010 > 151 10/11/2010 >150 12/01/2010 > 150 01/11/2011 > 04/27/2011  148> 148 07/31/2011 > 10/30/2011  148 > 01/31/2012  147 >143 06/04/2012 > 09/23/2012  149> 01/01/13  142 > 04/03/2013  140> 03/03/2014 141 >145 03/17/2014 >>142 04/20/2014 > 05/11/2014 >141 >141 06/11/14 > 09/22/2014 142 > 12/29/2014 139 > 04/20/2015  144 > 06/11/2015  142 >  08/24/2015  143 > 02/01/2016   144 > 05/02/2016  142 > 03/13/2017  139 >  06/26/2017  144 >   04/30/2018  139      Vital signs reviewed  06/21/2021  - Note at rest 02 sats  99% on RA   General appearance:    amb wf     HEENT : pt wearing mask not removed for exam due to covid -19 concerns.    NECK :  without JVD/Nodes/TM/ nl carotid upstrokes bilaterally   LUNGS: no acc muscle use,  Nl contour chest which is clear to A and P bilaterally without cough on insp or exp  maneuvers   CV:  RRR  no s3 or murmur or increase in P2, and no edema   ABD:  soft and nontender with nl inspiratory excursion in  the supine position. No bruits or organomegaly appreciated, bowel sounds nl  MS:  Nl gait/ ext warm without deformities, calf tenderness, cyanosis or clubbing No obvious joint restrictions   SKIN: warm and dry without lesions    NEURO:  alert, approp, nl sensorium with  no motor or cerebellar deficits apparent.    Labs ordered/ reviewed:      Chemistry      Component Value Date/Time   NA 136 06/21/2021 1035   K 4.2 06/21/2021 1035   CL 101 06/21/2021 1035   CO2 28 06/21/2021 1035   BUN 43 (H) 06/21/2021 1035   CREATININE 1.42 (H) 06/21/2021 1035      Component Value Date/Time   CALCIUM 9.4 06/21/2021 1035   ALKPHOS 34 (L) 06/21/2021 1035   AST 12 06/21/2021 1035   ALT 8 06/21/2021 1035   BILITOT 0.5 06/21/2021 1035        Lab Results  Component Value Date   WBC 8.0 06/21/2021   HGB 11.2 (L) 06/21/2021   HCT 33.6 (L) 06/21/2021   MCV 88.7 06/21/2021   PLT 211.0 06/21/2021        Lab Results  Component Value Date   TSH 1.93 06/21/2021

## 2021-06-21 NOTE — Assessment & Plan Note (Signed)
°  -   Target LDL < 100 since has DM but poor statin tolerance   Lab Results  Component Value Date   CHOL 291 (H) 06/21/2021   HDL 44.00 06/21/2021   LDLCALC 125 (H) 03/13/2017   LDLDIRECT 156.0 06/21/2021   TRIG 274.0 (H) 06/21/2021   CHOLHDL 7 06/21/2021     Referred to [facility] care 06/21/2021 >>>

## 2021-06-21 NOTE — Assessment & Plan Note (Signed)
All goals of chronic asthma control met including optimal function and elimination of symptoms with minimal need for rescue therapy. ° °Contingencies discussed in full including contacting this office immediately if not controlling the symptoms using the rule of two's.    ° °Based on two studies from NEJM  378; 20 p 1865 (2018) and 380 : p2020-30 (2019) in pts with mild asthma it is reasonable to use low dose symbicort eg 80 2bid "prn" flare in this setting but I emphasized this was only shown with symbicort and takes advantage of the rapid onset of action but is not the same as "rescue therapy" but can be stopped once the acute symptoms have resolved and the need for rescue has been minimized (< 2 x weekly)   ° °F/u prn flares not controlled on low dose symbicort ° °    °  ° °Each maintenance medication was reviewed in detail including emphasizing most importantly the difference between maintenance and prns and under what circumstances the prns are to be triggered using an action plan format where appropriate. ° °Total time for H and P, chart review, counseling, reviewing hfa  device(s) and generating customized AVS unique to this office visit / same day charting =  25 min  °     °

## 2021-06-21 NOTE — Assessment & Plan Note (Signed)
Lab Results  Component Value Date   HGBA1C 6.8 (H) 06/21/2021   HGBA1C 6.8 (H) 12/16/2020   HGBA1C 7.2 (H) 06/15/2020     Referred to [facility] Senior care 06/21/2021 >>>

## 2021-06-24 ENCOUNTER — Telehealth: Payer: Self-pay | Admitting: Internal Medicine

## 2021-06-24 NOTE — Telephone Encounter (Signed)
Gave pt results of blood work drawn on 06/21/21 as dictated by Dr. Melvyn Novas. Pt stated understanding. Nothing further needed at this time.

## 2021-08-03 ENCOUNTER — Other Ambulatory Visit: Payer: Self-pay | Admitting: Internal Medicine

## 2021-08-03 DIAGNOSIS — I10 Essential (primary) hypertension: Secondary | ICD-10-CM

## 2021-08-03 DIAGNOSIS — E1169 Type 2 diabetes mellitus with other specified complication: Secondary | ICD-10-CM

## 2021-08-09 ENCOUNTER — Other Ambulatory Visit: Payer: Self-pay | Admitting: Internal Medicine

## 2021-09-09 ENCOUNTER — Encounter: Payer: Self-pay | Admitting: Nurse Practitioner

## 2021-09-09 ENCOUNTER — Ambulatory Visit (INDEPENDENT_AMBULATORY_CARE_PROVIDER_SITE_OTHER): Payer: Medicare Other | Admitting: Nurse Practitioner

## 2021-09-09 VITALS — BP 140/76 | HR 68 | Temp 97.3°F | Ht 58.5 in | Wt 132.0 lb

## 2021-09-09 DIAGNOSIS — D649 Anemia, unspecified: Secondary | ICD-10-CM | POA: Diagnosis not present

## 2021-09-09 DIAGNOSIS — J4520 Mild intermittent asthma, uncomplicated: Secondary | ICD-10-CM

## 2021-09-09 DIAGNOSIS — I10 Essential (primary) hypertension: Secondary | ICD-10-CM

## 2021-09-09 DIAGNOSIS — M545 Low back pain, unspecified: Secondary | ICD-10-CM | POA: Diagnosis not present

## 2021-09-09 DIAGNOSIS — N1832 Type 2 diabetes mellitus with diabetic chronic kidney disease: Secondary | ICD-10-CM

## 2021-09-09 DIAGNOSIS — G8929 Other chronic pain: Secondary | ICD-10-CM | POA: Diagnosis not present

## 2021-09-09 DIAGNOSIS — E1169 Type 2 diabetes mellitus with other specified complication: Secondary | ICD-10-CM | POA: Diagnosis not present

## 2021-09-09 DIAGNOSIS — E039 Hypothyroidism, unspecified: Secondary | ICD-10-CM | POA: Diagnosis not present

## 2021-09-09 DIAGNOSIS — E1122 Type 2 diabetes mellitus with diabetic chronic kidney disease: Secondary | ICD-10-CM

## 2021-09-09 DIAGNOSIS — E785 Hyperlipidemia, unspecified: Secondary | ICD-10-CM | POA: Diagnosis not present

## 2021-09-09 MED ORDER — METFORMIN HCL 500 MG PO TABS: 500.0000 mg | ORAL_TABLET | Freq: Every day | ORAL | 0 refills | Status: DC

## 2021-09-09 NOTE — Progress Notes (Signed)
? ? ?Careteam: ?Patient Care Team: ?Tanda Rockers, MD as PCP - General (Pulmonary Disease) ? ?PLACE OF SERVICE:  ?[facility]  ?Advanced Directive information ?Does Patient Have a Medical Advance Directive?: Yes, Type of Advance Directive: [place];Living will, Does patient want to make changes to medical advance directive?: No - Patient declined ? ?Allergies  ?Allergen Reactions  ? Amoxicillin Rash  ? Lipitor [Atorvastatin]   ?  REACTION: aches  ? Statins   ?  Myalgias   ? ? ?Chief Complaint  ?Patient presents with  ? Establish Care  ?  New patient to establish care. Medications present at initial appointment.   ? ? ? ?HPI: Patient is a 88 y.o. female to establish care.  ?Hypertension- controlled on amlodipine 10 mg daily and bisoprolol and lasix 20 mg daily ?On ASA for preventive .  ? ?Asthma- she does not use symbicort routinely- only if she has wheezing which happens twice a year  ? ?Taking vit D as supplement bc she does not get outside much anymore.  ? ?Glaucoma- seeing Dr Prudencio Burly at [facility] ophthalmology. Poor vision. She has a driver. Does task independently otherwise.  ? ?GERD- uses pepcid, rarely PRN ? ?Gets legs cramps- takes tylenol which helps  ? ?Hypothyroid-on synthroid 50 mcg daily  ? ? DM- on metformin twice daily with food, generally only takes once daily ?Rarely will have a low blood sugar, only if she has not eaten properly. Generally eats 3 times a day ? ?She lives alone and does well. Continues to keep house and cook.  ? ? ?Review of Systems:  ?Review of Systems  ?Constitutional:  Negative for chills, fever and weight loss.  ?HENT:  Positive for hearing loss (wears hearing aid). Negative for tinnitus.   ?Respiratory:  Negative for cough, sputum production and shortness of breath.   ?Cardiovascular:  Negative for chest pain, palpitations and leg swelling.  ?Gastrointestinal:  Negative for abdominal pain, constipation, diarrhea and heartburn.  ?Genitourinary:  Negative for  dysuria, frequency and urgency.  ?Musculoskeletal:  Positive for back pain, joint pain and myalgias. Negative for falls.  ?Skin: Negative.   ?Neurological:  Positive for tingling. Negative for dizziness and headaches.  ?Psychiatric/Behavioral:  Negative for depression and memory loss. The patient does not have insomnia.   ? ?Past Medical History:  ?Diagnosis Date  ? Chronic rhinitis   ? CKD (chronic kidney disease), stage III ([place])   ? Disorder of bone and cartilage, unspecified   ? Essential hypertension, benign   ? Glaucoma   ? Per [place] new patient packet  ? Hearing loss   ? Per [place] new patient packet  ? High blood pressure   ? Per [place] new patient packet  ? High cholesterol   ? Per [place] new patient packet  ? Obesity, unspecified   ? Other and unspecified hyperlipidemia   ? Type II or unspecified type diabetes mellitus without mention of complication, not stated as uncontrolled   ? Vision loss   ? Due to catarct Per [place] new patient packet  ? ?Past Surgical History:  ?Procedure Laterality Date  ? CESAREAN SECTION N/A   ? ESOPHAGOGASTRODUODENOSCOPY N/A 04/29/2014  ? Procedure: ESOPHAGOGASTRODUODENOSCOPY (EGD);  Surgeon: Milus Banister, MD;  Location: [facility];  Service: Endoscopy;  Laterality: N/A;  ? TOTAL ABDOMINAL HYSTERECTOMY    ? ?Social History: ?  reports that she has never smoked. She has never used smokeless tobacco. She reports current alcohol use of about 7.0 standard  drinks per week. She reports that she does not use drugs. ? ?Family History  ?Problem Relation Age of Onset  ? COPD Mother   ? Lung cancer Father   ? Prostate cancer Other   ? Multiple myeloma Son   ? Colon cancer Neg Hx   ? Esophageal cancer Neg Hx   ? Rectal cancer Neg Hx   ? Stomach cancer Neg Hx   ? ? ?Medications: ?Patient's Medications  ?New Prescriptions  ? No medications on file  ?Previous Medications  ? ACETAMINOPHEN (TYLENOL) 325 MG TABLET    Take 325 mg by mouth every 6 (six) hours as needed for mild pain or fever.   ?  AMLODIPINE (NORVASC) 10 MG TABLET    TAKE 1 TABLET BY MOUTH DAILY  ? ASPIRIN 81 MG TABLET    Take 81 mg by mouth daily.  ? BISOPROLOL (ZEBETA) 5 MG TABLET    TAKE 1 TABLET BY MOUTH TWICE A DAY  ? BUDESONIDE-FORMOTEROL (SYMBICORT) 80-4.5 MCG/ACT INHALER    Inhale 2 puffs into the lungs 2 (two) times daily as needed (shortness of breath/wheezing).  ? CHOLECALCIFEROL (VITAMIN D) 1000 UNITS CAPSULE    Take 1,000 Units by mouth daily.  ? DORZOLAMIDE-TIMOLOL (COSOPT) 22.3-6.8 MG/ML OPHTHALMIC SOLUTION    Place 1 drop into both eyes 2 (two) times daily.   ? FAMOTIDINE (PEPCID) 20 MG TABLET    Take 20 mg by mouth at bedtime as needed for heartburn (COUGH FLARE).   ? FUROSEMIDE (LASIX) 20 MG TABLET    TAKE 1 TABLET BY MOUTH DAILY.  ? LEVOTHYROXINE (SYNTHROID) 50 MCG TABLET    TAKE 1 TABLET BY MOUTH DAILY BEFORE BREAKFAST.  ? METFORMIN (GLUCOPHAGE) 500 MG TABLET    TAKE 1 TABLET BY MOUTH 2 TIMES DAILY WITH A MEAL.  ? MULTIPLE VITAMINS-MINERALS (CENTRUM SILVER PO)    Take 1 tablet by mouth daily.  ? MULTIPLE VITAMINS-MINERALS (ICAPS PO)    Take 1 tablet by mouth daily.   ?Modified Medications  ? No medications on file  ?Discontinued Medications  ? CALCIUM CARBONATE (TUMS EX) 750 MG CHEWABLE TABLET    Chew 1 tablet by mouth 3 (three) times daily with meals.  ? CYCLOBENZAPRINE (FLEXERIL) 10 MG TABLET    Take 1 tablet (10 mg total) by mouth 3 (three) times daily as needed for muscle spasms.  ? DEXTROMETHORPHAN (DELSYM) 30 MG/5ML LIQUID    Take 60 mg by mouth every 12 (twelve) hours as needed for cough.   ? HOMEOPATHIC PRODUCTS ([employer] LEG CRAMPS PAIN RELIEF PO)    Take 1 tablet by mouth as needed (for leg cramps).   ? LORATADINE (CLARITIN) 10 MG TABLET    Take 10 mg by mouth daily as needed (DRIPPY NOSE/WATERY EYE/INTIATIVE).  ? OMEPRAZOLE (PRILOSEC) 40 MG CAPSULE    TAKE 1 CAPSULE BY MOUTH DAILY.  ? ? ?Physical Exam: ? ?Vitals:  ? 09/09/21 1049  ?BP: 140/76  ?Pulse: 68  ?Temp: (!) 97.3 ?F (36.3 ?C)  ?TempSrc: Temporal  ?SpO2: 99%   ?Weight: 132 lb (59.9 kg)  ?Height: 4' 10.5" (1.486 m)  ? ?Body mass index is 27.12 kg/m?. ?Wt Readings from Last 3 Encounters:  ?09/09/21 132 lb (59.9 kg)  ?06/21/21 132 lb (59.9 kg)  ?12/16/20 133 lb (60.3 kg)  ? ? ?Physical Exam ?Constitutional:   ?   General: She is not in acute distress. ?   Appearance: She is well-developed. She is not diaphoretic.  ?HENT:  ?   Head:  Normocephalic and atraumatic.  ?   Mouth/Throat:  ?   Pharynx: No oropharyngeal exudate.  ?Eyes:  ?   Conjunctiva/sclera: Conjunctivae normal.  ?   Pupils: Pupils are equal, round, and reactive to light.  ?Cardiovascular:  ?   Rate and Rhythm: Normal rate and regular rhythm.  ?   Heart sounds: Normal heart sounds.  ?Pulmonary:  ?   Effort: Pulmonary effort is normal.  ?   Breath sounds: Normal breath sounds.  ?Abdominal:  ?   General: Bowel sounds are normal.  ?   Palpations: Abdomen is soft.  ?Musculoskeletal:  ?   Cervical back: Normal range of motion and neck supple.  ?   Right lower leg: No edema.  ?   Left lower leg: No edema.  ?Skin: ?   General: Skin is warm and dry.  ?Neurological:  ?   Mental Status: She is alert.  ?Psychiatric:     ?   Mood and Affect: Mood normal.  ? ? ?Labs reviewed: ?Basic Metabolic Panel: ?Recent Labs  ?  12/16/20 ?1110 06/21/21 ?1035  ?NA 137 136  ?K 4.7 4.2  ?CL 102 101  ?CO2 26 28  ?GLUCOSE 142* 137*  ?BUN 33* 43*  ?CREATININE 1.35* 1.42*  ?CALCIUM 9.4 9.4  ?TSH  --  1.93  ? ?Liver Function Tests: ?Recent Labs  ?  06/21/21 ?1035  ?AST 12  ?ALT 8  ?ALKPHOS 34*  ?BILITOT 0.5  ?PROT 7.1  ?ALBUMIN 4.1  ? ?No results for input(s): LIPASE, AMYLASE in the last 8760 hours. ?No results for input(s): AMMONIA in the last 8760 hours. ?CBC: ?Recent Labs  ?  06/21/21 ?1035  ?WBC 8.0  ?NEUTROABS 5.3  ?HGB 11.2*  ?HCT 33.6*  ?MCV 88.7  ?PLT 211.0  ? ?Lipid Panel: ?Recent Labs  ?  06/21/21 ?1035  ?CHOL 291*  ?HDL 44.00  ?TRIG 274.0*  ?CHOLHDL 7  ?LDLDIRECT 156.0  ? ?TSH: ?Recent Labs  ?  06/21/21 ?1035  ?TSH 1.93  ? ?A1C: ?Lab  Results  ?Component Value Date  ? HGBA1C 6.8 (H) 06/21/2021  ? ? ? ?Assessment/Plan ?1. Stage 3b chronic kidney disease ([place]) ?-Chronic and stable ?Encourage proper hydration ?Follow metabolic panel ?Avoid nephrotoxic m

## 2021-09-09 NOTE — Patient Instructions (Addendum)
Make sure you are drinking plenty of water.  ?Decrease metformin to once daily with breakfast  ? ? ?Schedule AWV for 6 weeks. --- this is telephone visit  ? ?Follow up for routine follow up in 4 months.  ?

## 2021-10-17 ENCOUNTER — Other Ambulatory Visit: Payer: Self-pay | Admitting: Nurse Practitioner

## 2021-10-17 ENCOUNTER — Other Ambulatory Visit: Payer: Self-pay | Admitting: *Deleted

## 2021-10-17 DIAGNOSIS — I10 Essential (primary) hypertension: Secondary | ICD-10-CM

## 2021-10-17 MED ORDER — BISOPROLOL FUMARATE 5 MG PO TABS: 5.0000 mg | ORAL_TABLET | Freq: Two times a day (BID) | ORAL | 1 refills | Status: DC

## 2021-10-17 NOTE — Telephone Encounter (Signed)
Pharmacy requested refill. Pended Rx and sent to [facility] for approval due to HIGH ALERT Warning.  

## 2021-10-18 ENCOUNTER — Ambulatory Visit (INDEPENDENT_AMBULATORY_CARE_PROVIDER_SITE_OTHER): Payer: Medicare Other | Admitting: Nurse Practitioner

## 2021-10-18 ENCOUNTER — Encounter: Payer: Self-pay | Admitting: Nurse Practitioner

## 2021-10-18 DIAGNOSIS — Z0000 Encounter for general adult medical examination without abnormal findings: Secondary | ICD-10-CM | POA: Diagnosis not present

## 2021-10-18 DIAGNOSIS — E2839 Other primary ovarian failure: Secondary | ICD-10-CM

## 2021-10-18 NOTE — Progress Notes (Signed)
   This service is provided via telemedicine  No vital signs collected/recorded due to the encounter was a telemedicine visit.   Location of patient (ex: home, work):  Home  Patient consents to a telephone visit: Yes, see telephone visit dated 10/18/21  Location of the provider (ex: office, home):  [facility], Office   Name of any referring provider:  N/A  Names of all persons participating in the telemedicine service and their role in the encounter:  S.Chrae B/CMA, Sherrie Mustache, NP, and Patient   Time spent on call:  9 min with medical assistant

## 2021-10-18 NOTE — Progress Notes (Signed)
Subjective:   Mikayla [name] is a 88 y.o. female who presents for Medicare Annual (Subsequent) preventive examination.  Review of Systems     Cardiac Risk Factors include: advanced age (>45mn, >>37women);sedentary lifestyle;diabetes mellitus;hypertension;dyslipidemia     Objective:    There were no vitals filed for this visit. There is no height or weight on file to calculate BMI.     10/18/2021    9:56 AM 09/09/2021   10:37 AM 05/25/2019    1:23 AM 12/29/2014   10:00 AM 04/29/2014    3:27 PM 04/29/2014    1:04 PM  Advanced Directives  Does Patient Have a Medical Advance Directive? Yes Yes No Yes No No  Type of [place]  Living will    Does patient want to make changes to medical advance directive? No - Patient declined No - Patient declined      Copy of [place] Chart? No - copy requested No - copy requested      Would patient like information on creating a medical advance directive?   No - Patient declined       Current Medications (verified) Outpatient Encounter Medications as of 10/18/2021  Medication Sig   acetaminophen (TYLENOL) 325 MG tablet Take 325 mg by mouth every 6 (six) hours as needed for mild pain or fever.    amLODipine (NORVASC) 10 MG tablet TAKE 1 TABLET BY MOUTH DAILY   aspirin 81 MG tablet Take 81 mg by mouth daily.   bisoprolol (ZEBETA) 5 MG tablet Take 5 mg by mouth daily.   budesonide-formoterol (SYMBICORT) 80-4.5 MCG/ACT inhaler Inhale 2 puffs into the lungs 2 (two) times daily as needed (shortness of breath/wheezing).   Cholecalciferol (VITAMIN D) 1000 UNITS capsule Take 1,000 Units by mouth daily.   dorzolamide-timolol (COSOPT) 22.3-6.8 MG/ML ophthalmic solution Place 1 drop into both eyes 2 (two) times daily.    famotidine (PEPCID) 20 MG tablet Take 20 mg by mouth at bedtime as needed for heartburn (COUGH FLARE).    furosemide (LASIX)  20 MG tablet TAKE 1 TABLET BY MOUTH DAILY.   levothyroxine (SYNTHROID) 50 MCG tablet TAKE 1 TABLET BY MOUTH DAILY BEFORE BREAKFAST.   metFORMIN (GLUCOPHAGE) 500 MG tablet Take 1 tablet (500 mg total) by mouth daily with breakfast.   Multiple Vitamins-Minerals (CENTRUM SILVER PO) Take 1 tablet by mouth daily.   [DISCONTINUED] bisoprolol (ZEBETA) 5 MG tablet Take 1 tablet (5 mg total) by mouth 2 (two) times daily.   No facility-administered encounter medications on file as of 10/18/2021.    Allergies (verified) Amoxicillin, Lipitor [atorvastatin], and Statins   History: Past Medical History:  Diagnosis Date   Chronic rhinitis    CKD (chronic kidney disease), stage III (HCC)    Disorder of bone and cartilage, unspecified    Essential hypertension, benign    Glaucoma    Per [facility] new patient packet   Hearing loss    Per [facility]   High blood pressure    Per [facility] new patient packet   High cholesterol    Per [facility] patient packet   Obesity, unspecified    Other and unspecified hyperlipidemia    Type II or unspecified type diabetes mellitus without mention of complication, not stated as uncontrolled    Vision loss    Due to catarct Per [facility] patient packet   Past Surgical History:  Procedure Laterality Date  CESAREAN SECTION N/A    ESOPHAGOGASTRODUODENOSCOPY N/A 04/29/2014   Procedure: ESOPHAGOGASTRODUODENOSCOPY (EGD);  Surgeon: Milus Banister, MD;  Location: [facility];  Service: Endoscopy;  Laterality: N/A;   TOTAL ABDOMINAL HYSTERECTOMY     Family History  Problem Relation Age of Onset   COPD Mother    Lung cancer Father    Prostate cancer Other    Multiple myeloma Son    Colon cancer Neg Hx    Esophageal cancer Neg Hx    Rectal cancer Neg Hx    Stomach cancer Neg Hx    Social History   Socioeconomic History   Marital status: Married    Spouse name: Not on file   Number of children: Not on file   Years of education: Not on file   Highest education  level: Not on file  Occupational History   Not on file  Tobacco Use   Smoking status: Never   Smokeless tobacco: Never  Vaping Use   Vaping Use: Never used  Substance and Sexual Activity   Alcohol use: Yes    Alcohol/week: 7.0 standard drinks    Types: 7 Standard drinks or equivalent per week    Comment: wine with dinner   Drug use: No   Sexual activity: Not on file  Other Topics Concern   Not on file  Social History Narrative   Diet      Do you drink/eat things with caffeine  YES      Marital Status Widowed  What year were you married?  1953      Do you live in a house, apartment, assisted living, condo, trailer, etc.?  House      Is it one or more stories?  One      How many persons live in your home?  1         Do you have any pets in your home?(please list)  No      Highest level of education completed:  High School      Current or past profession:  Statistician      Do you exercise?:  NO    Type and how often:      Do you have a Living Will? (Form that indicates scenarios where you would not want your life prolonged)  YES      Do you have a DNR form?   YES      If not, would you like to discuss one?      Do you have signed POA/HPOA forms?  YES      Do you have difficulty bathing or dressing yourself?  NO      Do you have difficulty preparing food or eating?  NO      Do you have difficulty managing medications?  NO      Do you have difficulty managing your finances?  NO      Do you have difficulty affording your medications? NO                     Social Determinants of Radio broadcast assistant Strain: Not on file  Food Insecurity: Not on file  Transportation Needs: Not on file  Physical Activity: Not on file  Stress: Not on file  Social Connections: Not on file    Tobacco Counseling Counseling given: Not Answered   Clinical Intake:  Pre-visit preparation completed: Yes  Pain : No/denies pain     BMI - recorded:  27 Nutritional Status: BMI 25 -29 Overweight Diabetes: No  How often do you need to have someone help you when you read instructions, pamphlets, or other written materials from your doctor or pharmacy?: 2 - Rarely  Diabetic?no         Activities of Daily Living    10/18/2021   10:19 AM  In your present state of health, do you have any difficulty performing the following activities:  Hearing? 1  Vision? 1  Difficulty concentrating or making decisions? 0  Walking or climbing stairs? 0  Dressing or bathing? 0  Doing errands, shopping? 0  Preparing Food and eating ? N  Using the Toilet? N  In the past six months, have you accidently leaked urine? N  Do you have problems with loss of bowel control? N  Managing your Medications? N  Managing your Finances? N  Housekeeping or managing your Housekeeping? N    Patient Care Team: Lauree Chandler, NP as PCP - General (Geriatric Medicine) Katy Apo, MD as Consulting Physician (Ophthalmology)  Indicate any recent Medical Services you may have received from other than [facility] providers in the past year (date may be approximate).     Assessment:   This is a routine wellness examination for [place].  Hearing/Vision screen Hearing Screening - Comments:: Wears hearing aids once in a while.  Vision Screening - Comments:: Last eye exam less than 12 months ago. Pending appointment next Thursday, Dr.Lyles   Dietary issues and exercise activities discussed: Current Exercise Habits: The patient does not participate in regular exercise at present   Goals Addressed   None    Depression Screen    10/18/2021    9:55 AM  PHQ 2/9 Scores  PHQ - 2 Score 0    Fall Risk    10/18/2021    9:55 AM 09/09/2021   10:48 AM  Fall Risk   Falls in the past year? 0 0  Number falls in past yr: 0 0  Injury with Fall? 0 0  Risk for fall due to : No Fall Risks No Fall Risks  Follow up Falls evaluation completed Falls evaluation completed     Price:  Any stairs in or around the home? Yes  If so, are there any without handrails? Yes  Home free of loose throw rugs in walkways, pet beds, electrical cords, etc? Yes  Adequate lighting in your home to reduce risk of falls? Yes   ASSISTIVE DEVICES UTILIZED TO PREVENT FALLS:  Life alert? Yes  Use of a cane, walker or w/c? No  Grab bars in the bathroom? Yes  Shower chair or bench in shower? No  Elevated toilet seat or a handicapped toilet? No   TIMED UP AND GO:  Was the test performed? No .    Cognitive Function:        10/18/2021    9:57 AM  6CIT Screen  What Year? 0 points  What month? 0 points  What time? 0 points  Count back from 20 0 points  Months in reverse 0 points  Repeat phrase 0 points  Total Score 0 points    Immunizations Immunization History  Administered Date(s) Administered   Fluad Quad(high Dose 65+) 02/25/2019, 04/13/2020, 06/21/2021   Influenza Split 01/25/2011, 01/31/2012   Influenza Whole 02/28/2007, 04/02/2008, 03/12/2009, 02/10/2010   Influenza, High Dose Seasonal PF 02/01/2016, 03/13/2017, 01/29/2018   Influenza,inj,Quad PF,6+ Mos 04/02/2013, 03/03/2014, 04/20/2015   PFIZER(Purple Top)SARS-COV-2 Vaccination 06/19/2019, 07/14/2019  Pneumococcal Conjugate-13 03/03/2014   Pneumococcal Polysaccharide-23 06/04/2012   Pneumococcal-Unspecified 09/12/2000   Td 08/13/2006   Tdap 12/05/2016    TDAP status: Up to date  Flu Vaccine status: Up to date  Pneumococcal vaccine status: Up to date  Covid-19 vaccine status: Information provided on how to obtain vaccines.   Qualifies for Shingles Vaccine? Yes   Zostavax completed No   Shingrix Completed?: No.    Education has been provided regarding the importance of this vaccine. Patient has been advised to call insurance company to determine out of pocket expense if they have not yet received this vaccine. Advised may also receive vaccine at local  pharmacy or Health Dept. Verbalized acceptance and understanding.  Screening Tests Health Maintenance  Topic Date Due   FOOT EXAM  Never done   OPHTHALMOLOGY EXAM  Never done   DEXA SCAN  Never done   URINE MICROALBUMIN  03/04/2015   COVID-19 Vaccine (3 - Booster for Pfizer series) 09/08/2019   INFLUENZA VACCINE  12/13/2021   HEMOGLOBIN A1C  12/19/2021   TETANUS/TDAP  12/06/2026   Pneumonia Vaccine 49+ Years old  Completed   HPV VACCINES  Aged Out   Zoster Vaccines- Shingrix  Discontinued    Health Maintenance  Health Maintenance Due  Topic Date Due   FOOT EXAM  Never done   OPHTHALMOLOGY EXAM  Never done   DEXA SCAN  Never done   URINE MICROALBUMIN  03/04/2015   COVID-19 Vaccine (3 - Booster for [place] series) 09/08/2019    Colorectal cancer screening: No longer required.   Mammogram status: No longer required due to age.  Bone Density status: Ordered today. Pt provided with contact info and advised to call to schedule appt.  Lung Cancer Screening: (Low Dose CT Chest recommended if Age 25-80 years, 30 pack-year currently smoking OR have quit w/in 15years.) does not qualify.   Lung Cancer Screening Referral: na  Additional Screening:  Hepatitis C Screening: does not qualify  Vision Screening: Recommended annual ophthalmology exams for early detection of glaucoma and other disorders of the eye. Is the patient up to date with their annual eye exam?  Yes  Who is the provider or what is the name of the office in which the patient attends annual eye exams? [facility] If pt is not established with a provider, would they like to be referred to a provider to establish care? No .   Dental Screening: Recommended annual dental exams for proper oral hygiene  Community Resource Referral / Chronic Care Management: CRR required this visit?  No   CCM required this visit?  No      Plan:     I have personally reviewed and noted the following in the patient's chart:   Medical  and social history Use of alcohol, tobacco or illicit drugs  Current medications and supplements including opioid prescriptions.  Functional ability and status Nutritional status Physical activity Advanced directives List of other physicians Hospitalizations, surgeries, and ER visits in previous 12 months Vitals Screenings to include cognitive, depression, and falls Referrals and appointments  In addition, I have reviewed and discussed with patient certain preventive protocols, quality metrics, and best practice recommendations. A written personalized care plan for preventive services as well as general preventive health recommendations were provided to patient.     Lauree Chandler, NP   10/18/2021    Virtual Visit via Telephone Note  I connected with patient 10/18/21 at 10:00 AM EDT by telephone and verified that I am  speaking with the correct person using two identifiers.  Location: Patient: home Provider: [facility]   I discussed the limitations, risks, security and privacy concerns of performing an evaluation and management service by telephone and the availability of in person appointments. I also discussed with the patient that there may be a patient responsible charge related to this service. The patient expressed understanding and agreed to proceed.   I discussed the assessment and treatment plan with the patient. The patient was provided an opportunity to ask questions and all were answered. The patient agreed with the plan and demonstrated an understanding of the instructions.   The patient was advised to call back or seek an in-person evaluation if the symptoms worsen or if the condition fails to improve as anticipated.  I provided 15 minutes of non-face-to-face time during this encounter.  [employer]. Harle Battiest Avs printed and mailed

## 2021-10-18 NOTE — Patient Instructions (Addendum)
Mikayla [name] , Thank you for taking time to come for your Medicare Wellness Visit. I appreciate your ongoing commitment to your health goals. Please review the following plan we discussed and let me know if I can assist you in the future.   Screening recommendations/referrals: Colonoscopy aged out Mammogram aged out Bone Density ordered today Recommended yearly ophthalmology/optometry visit for glaucoma screening and checkup Recommended yearly dental visit for hygiene and checkup  Vaccinations: Influenza vaccine up to date Pneumococcal vaccine up to date Tdap vaccine up to date Shingles vaccine- you have declined but if you decide you want- recommend to get at your local pharmacy       Advanced directives: recommend to bring to office to place on file.   Conditions/risks identified: advance age, diabetes,   Next appointment: yearly    Preventive Care 86 Years and Older, Female Preventive care refers to lifestyle choices and visits with your health care provider that can promote health and wellness. What does preventive care include? A yearly physical exam. This is also called an annual well check. Dental exams once or twice a year. Routine eye exams. Ask your health care provider how often you should have your eyes checked. Personal lifestyle choices, including: Daily care of your teeth and gums. Regular physical activity. Eating a healthy diet. Avoiding tobacco and drug use. Limiting alcohol use. Practicing safe sex. Taking low-dose aspirin every day. Taking vitamin and mineral supplements as recommended by your health care provider. What happens during an annual well check? The services and screenings done by your health care provider during your annual well check will depend on your age, overall health, lifestyle risk factors, and family history of disease. Counseling  Your health care provider may ask you questions about your: Alcohol use. Tobacco use. Drug  use. Emotional well-being. Home and relationship well-being. Sexual activity. Eating habits. History of falls. Memory and ability to understand (cognition). Work and work Statistician. Reproductive health. Screening  You may have the following tests or measurements: Height, weight, and BMI. Blood pressure. Lipid and cholesterol levels. These may be checked every 5 years, or more frequently if you are over 28 years old. Skin check. Lung cancer screening. You may have this screening every year starting at age 29 if you have a 30-pack-year history of smoking and currently smoke or have quit within the past 15 years. Fecal occult blood test (FOBT) of the stool. You may have this test every year starting at age 30. Flexible sigmoidoscopy or colonoscopy. You may have a sigmoidoscopy every 5 years or a colonoscopy every 10 years starting at age 64. Hepatitis C blood test. Hepatitis B blood test. Sexually transmitted disease (STD) testing. Diabetes screening. This is done by checking your blood sugar (glucose) after you have not eaten for a while (fasting). You may have this done every 1-3 years. Bone density scan. This is done to screen for osteoporosis. You may have this done starting at age 72. Mammogram. This may be done every 1-2 years. Talk to your health care provider about how often you should have regular mammograms. Talk with your health care provider about your test results, treatment options, and if necessary, the need for more tests. Vaccines  Your health care provider may recommend certain vaccines, such as: Influenza vaccine. This is recommended every year. Tetanus, diphtheria, and acellular pertussis (Tdap, Td) vaccine. You may need a Td booster every 10 years. Zoster vaccine. You may need this after age 63. Pneumococcal 13-valent conjugate (PCV13) vaccine. One  dose is recommended after age 42. Pneumococcal polysaccharide (PPSV23) vaccine. One dose is recommended after age  33. Talk to your health care provider about which screenings and vaccines you need and how often you need them. This information is not intended to replace advice given to you by your health care provider. Make sure you discuss any questions you have with your health care provider. Document Released: 05/28/2015 Document Revised: 01/19/2016 Document Reviewed: 03/02/2015 Elsevier Interactive Patient Education  2017 [place] Prevention in the Home Falls can cause injuries. They can happen to people of all ages. There are many things you can do to make your home safe and to help prevent falls. What can I do on the outside of my home? Regularly fix the edges of walkways and driveways and fix any cracks. Remove anything that might make you trip as you walk through a door, such as a raised step or threshold. Trim any bushes or trees on the path to your home. Use bright outdoor lighting. Clear any walking paths of anything that might make someone trip, such as rocks or tools. Regularly check to see if handrails are loose or broken. Make sure that both sides of any steps have handrails. Any raised decks and porches should have guardrails on the edges. Have any leaves, snow, or ice cleared regularly. Use sand or salt on walking paths during winter. Clean up any spills in your garage right away. This includes oil or grease spills. What can I do in the bathroom? Use night lights. Install grab bars by the toilet and in the tub and shower. Do not use towel bars as grab bars. Use non-skid mats or decals in the tub or shower. If you need to sit down in the shower, use a plastic, non-slip stool. Keep the floor dry. Clean up any water that spills on the floor as soon as it happens. Remove soap buildup in the tub or shower regularly. Attach bath mats securely with double-sided non-slip rug tape. Do not have throw rugs and other things on the floor that can make you trip. What can I do in the  bedroom? Use night lights. Make sure that you have a light by your bed that is easy to reach. Do not use any sheets or blankets that are too big for your bed. They should not hang down onto the floor. Have a firm chair that has side arms. You can use this for support while you get dressed. Do not have throw rugs and other things on the floor that can make you trip. What can I do in the kitchen? Clean up any spills right away. Avoid walking on wet floors. Keep items that you use a lot in easy-to-reach places. If you need to reach something above you, use a strong step stool that has a grab bar. Keep electrical cords out of the way. Do not use floor polish or wax that makes floors slippery. If you must use wax, use non-skid floor wax. Do not have throw rugs and other things on the floor that can make you trip. What can I do with my stairs? Do not leave any items on the stairs. Make sure that there are handrails on both sides of the stairs and use them. Fix handrails that are broken or loose. Make sure that handrails are as long as the stairways. Check any carpeting to make sure that it is firmly attached to the stairs. Fix any carpet that is loose or worn.  Avoid having throw rugs at the top or bottom of the stairs. If you do have throw rugs, attach them to the floor with carpet tape. Make sure that you have a light switch at the top of the stairs and the bottom of the stairs. If you do not have them, ask someone to add them for you. What else can I do to help prevent falls? Wear shoes that: Do not have high heels. Have rubber bottoms. Are comfortable and fit you well. Are closed at the toe. Do not wear sandals. If you use a stepladder: Make sure that it is fully opened. Do not climb a closed stepladder. Make sure that both sides of the stepladder are locked into place. Ask someone to hold it for you, if possible. Clearly mark and make sure that you can see: Any grab bars or  handrails. First and last steps. Where the edge of each step is. Use tools that help you move around (mobility aids) if they are needed. These include: Canes. Walkers. Scooters. Crutches. Turn on the lights when you go into a dark area. Replace any light bulbs as soon as they burn out. Set up your furniture so you have a clear path. Avoid moving your furniture around. If any of your floors are uneven, fix them. If there are any pets around you, be aware of where they are. Review your medicines with your doctor. Some medicines can make you feel dizzy. This can increase your chance of falling. Ask your doctor what other things that you can do to help prevent falls. This information is not intended to replace advice given to you by your health care provider. Make sure you discuss any questions you have with your health care provider. Document Released: 02/25/2009 Document Revised: 10/07/2015 Document Reviewed: 06/05/2014 Elsevier Interactive Patient Education  2017 [employer].

## 2021-10-21 ENCOUNTER — Ambulatory Visit (INDEPENDENT_AMBULATORY_CARE_PROVIDER_SITE_OTHER): Payer: Medicare Other | Admitting: Nurse Practitioner

## 2021-10-21 ENCOUNTER — Telehealth: Payer: Self-pay

## 2021-10-21 ENCOUNTER — Encounter: Payer: Self-pay | Admitting: Nurse Practitioner

## 2021-10-21 ENCOUNTER — Telehealth: Payer: Self-pay | Admitting: Nurse Practitioner

## 2021-10-21 VITALS — Temp 98.6°F

## 2021-10-21 DIAGNOSIS — R051 Acute cough: Secondary | ICD-10-CM

## 2021-10-21 MED ORDER — ALBUTEROL SULFATE HFA 108 (90 BASE) MCG/ACT IN AERS: 2.0000 | INHALATION_SPRAY | Freq: Four times a day (QID) | RESPIRATORY_TRACT | 2 refills | Status: DC | PRN

## 2021-10-21 MED ORDER — VITAMIN C 1000 MG PO TABS: 1000.0000 mg | ORAL_TABLET | Freq: Two times a day (BID) | ORAL | 0 refills | Status: DC

## 2021-10-21 MED ORDER — DM-GUAIFENESIN ER 30-600 MG PO TB12: 1.0000 | ORAL_TABLET | Freq: Two times a day (BID) | ORAL | 0 refills | Status: DC

## 2021-10-21 NOTE — Telephone Encounter (Signed)
Scheduled patient a Telephone visit for this afternoon with Mikayla [name]. Patient could not come into office and does not have MyChart.

## 2021-10-21 NOTE — Progress Notes (Signed)
   This service is provided via telemedicine  No vital signs collected/recorded due to the encounter was a telemedicine visit.   Location of patient (ex: home, work):  Home  Patient consents to a telephone visit: Yes, see telephone visit dated 10/21/21   Location of the provider (ex: office, home):  [facility], Office   Name of any referring provider:  N/A  Names of all persons participating in the telemedicine service and their role in the encounter:  S.Chrae B/CMA, Sherrie Mustache, NP, and Patient   Time spent on call:  5 min with medical assistant

## 2021-10-21 NOTE — Progress Notes (Signed)
Careteam: Patient Care Team: Lauree Chandler, NP as PCP - General (Geriatric Medicine) Katy Apo, MD as Consulting Physician (Ophthalmology)  Advanced Directive information    Allergies  Allergen Reactions   Amoxicillin Rash   Lipitor [Atorvastatin]     REACTION: aches   Statins     Myalgias     Chief Complaint  Patient presents with   Acute Visit    Possible sinus infection, patient c/o chest cough. Denies fever. Visit completed via telephone call.      HPI: Patient is a 88 y.o. female via telephone visit due to cough.  Reports she will generally get a cough yearly and will start wheezing no wheezing at this time. No shortness of breath.  No sore throat, no nasal or chest congestion No sick contacts that she is aware of.  No fever  Cough nonproductive mostly, sometimes coughing some up.  Has tylenol.  Using delsym in the morning She will have coughing fits and then it will get better   Review of Systems:  Review of Systems  Constitutional:  Positive for chills (better at this time). Negative for fever.  HENT:  Negative for congestion, ear pain and sore throat.   Respiratory:  Positive for cough. Negative for sputum production, shortness of breath and wheezing.   Cardiovascular:  Negative for chest pain and palpitations.  Neurological:  Negative for dizziness and headaches.    Past Medical History:  Diagnosis Date   Chronic rhinitis    CKD (chronic kidney disease), stage III (HCC)    Disorder of bone and cartilage, unspecified    Essential hypertension, benign    Glaucoma    Per [facility] new patient packet   Hearing loss    Per [place] new patient packet   High blood pressure    Per [facility] new patient packet   High cholesterol    Per [place] new patient packet   Obesity, unspecified    Other and unspecified hyperlipidemia    Type II or unspecified type diabetes mellitus without mention of complication, not stated as uncontrolled    Vision loss    Due to  catarct Per [place] new patient packet   Past Surgical History:  Procedure Laterality Date   CESAREAN SECTION N/A    ESOPHAGOGASTRODUODENOSCOPY N/A 04/29/2014   Procedure: ESOPHAGOGASTRODUODENOSCOPY (EGD);  Surgeon: Milus Banister, MD;  Location: [facility];  Service: Endoscopy;  Laterality: N/A;   TOTAL ABDOMINAL HYSTERECTOMY     Social History:   reports that she has never smoked. She has never used smokeless tobacco. She reports current alcohol use of about 7.0 standard drinks of alcohol per week. She reports that she does not use drugs.  Family History  Problem Relation Age of Onset   COPD Mother    Lung cancer Father    Prostate cancer Other    Multiple myeloma Son    Colon cancer Neg Hx    Esophageal cancer Neg Hx    Rectal cancer Neg Hx    Stomach cancer Neg Hx     Medications: Patient's Medications  New Prescriptions   No medications on file  Previous Medications   ACETAMINOPHEN (TYLENOL) 325 MG TABLET    Take 325 mg by mouth every 6 (six) hours as needed for mild pain or fever.    AMLODIPINE (NORVASC) 10 MG TABLET    TAKE 1 TABLET BY MOUTH DAILY   ASPIRIN 81 MG TABLET    Take 81 mg by mouth daily.  BISOPROLOL (ZEBETA) 5 MG TABLET    Take 5 mg by mouth daily.   BUDESONIDE-FORMOTEROL (SYMBICORT) 80-4.5 MCG/ACT INHALER    Inhale 2 puffs into the lungs 2 (two) times daily as needed (shortness of breath/wheezing).   CHOLECALCIFEROL (VITAMIN D) 1000 UNITS CAPSULE    Take 1,000 Units by mouth daily.   DORZOLAMIDE-TIMOLOL (COSOPT) 22.3-6.8 MG/ML OPHTHALMIC SOLUTION    Place 1 drop into both eyes 2 (two) times daily.    FAMOTIDINE (PEPCID) 20 MG TABLET    Take 20 mg by mouth at bedtime as needed for heartburn (COUGH FLARE).    FUROSEMIDE (LASIX) 20 MG TABLET    TAKE 1 TABLET BY MOUTH DAILY.   LEVOTHYROXINE (SYNTHROID) 50 MCG TABLET    TAKE 1 TABLET BY MOUTH DAILY BEFORE BREAKFAST.   METFORMIN (GLUCOPHAGE) 500 MG TABLET    Take 1 tablet (500 mg total) by mouth daily with  breakfast.   MULTIPLE VITAMINS-MINERALS (CENTRUM SILVER PO)    Take 1 tablet by mouth daily.  Modified Medications   No medications on file  Discontinued Medications   No medications on file    Physical Exam:  Vitals:   10/21/21 1411  Temp: 98.6 F (37 C)   There is no height or weight on file to calculate BMI. Wt Readings from Last 3 Encounters:  09/09/21 132 lb (59.9 kg)  06/21/21 132 lb (59.9 kg)  12/16/20 133 lb (60.3 kg)      Labs reviewed: Basic Metabolic Panel: Recent Labs    12/16/20 1110 06/21/21 1035  NA 137 136  K 4.7 4.2  CL 102 101  CO2 26 28  GLUCOSE 142* 137*  BUN 33* 43*  CREATININE 1.35* 1.42*  CALCIUM 9.4 9.4  TSH  --  1.93   Liver Function Tests: Recent Labs    06/21/21 1035  AST 12  ALT 8  ALKPHOS 34*  BILITOT 0.5  PROT 7.1  ALBUMIN 4.1   No results for input(s): "LIPASE", "AMYLASE" in the last 8760 hours. No results for input(s): "AMMONIA" in the last 8760 hours. CBC: Recent Labs    06/21/21 1035  WBC 8.0  NEUTROABS 5.3  HGB 11.2*  HCT 33.6*  MCV 88.7  PLT 211.0   Lipid Panel: Recent Labs    06/21/21 1035  CHOL 291*  HDL 44.00  TRIG 274.0*  CHOLHDL 7  LDLDIRECT 156.0   TSH: Recent Labs    06/21/21 1035  TSH 1.93   A1C: Lab Results  Component Value Date   HGBA1C 6.8 (H) 06/21/2021     Assessment/Plan 1. Acute cough -ongoing for 3 days. Recommended supportive care.  - albuterol (VENTOLIN HFA) 108 (90 Base) MCG/ACT inhaler; Inhale 2 puffs into the lungs every 6 (six) hours as needed for wheezing or shortness of breath or cough  Dispense: 8 g; Refill: 2 - dextromethorphan-guaiFENesin (MUCINEX DM) 30-600 MG 12hr tablet; Take 1 tablet by mouth 2 (two) times daily.  Dispense: 30 tablet; Refill: 0 - Ascorbic Acid (VITAMIN C) 1000 MG tablet; Take 1 tablet (1,000 mg total) by mouth 2 (two) times daily.  Dispense: 60 tablet; Refill: 0 -strict follow up precautions given.   Mikayla [name]. Harle Battiest  [facility] [phone]    Virtual Visit via telephone  I connected with patient on 10/21/21 at  2:20 PM EDT by telephone and verified that I am speaking with the correct person using two identifiers.  Location: Patient: home Provider: [place]   I discussed  the limitations, risks, security and privacy concerns of performing an evaluation and management service by telephone and the availability of in person appointments. I also discussed with the patient that there may be a patient responsible charge related to this service. The patient expressed understanding and agreed to proceed.   I discussed the assessment and treatment plan with the patient. The patient was provided an opportunity to ask questions and all were answered. The patient agreed with the plan and demonstrated an understanding of the instructions.   The patient was advised to call back or seek an in-person evaluation if the symptoms worsen or if the condition fails to improve as anticipated.  I provided 15 minutes of non-face-to-face time during this encounter.  Mikayla [name]. Harle Battiest Avs printed and mailed

## 2021-10-21 NOTE — Telephone Encounter (Signed)
Pt called requesting Z-PAK for sinuses. Per pt her symptoms started Sunday 6/4.  Pt states her previous provider would call it in for her. Pt concerned she may not have RX for weekend and get worse. Please advise.

## 2021-10-21 NOTE — Telephone Encounter (Signed)
Ms. Mikayla [name], Mikayla [name] are scheduled for a virtual visit with your provider today.    Just as we do with appointments in the office, we must obtain your consent to participate.  Your consent will be active for this visit and any virtual visit you may have with one of our providers in the next 365 days.    If you have a MyChart account, I can also send a copy of this consent to you electronically.  All virtual visits are billed to your insurance company just like a traditional visit in the office.  As this is a virtual visit, video technology does not allow for your provider to perform a traditional examination.  This may limit your provider's ability to fully assess your condition.  If your provider identifies any concerns that need to be evaluated in person or the need to arrange testing such as labs, EKG, etc, we will make arrangements to do so.    Although advances in technology are sophisticated, we cannot ensure that it will always work on either your end or our end.  If the connection with a video visit is poor, we may have to switch to a telephone visit.  With either a video or telephone visit, we are not always able to ensure that we have a secure connection.   I need to obtain your verbal consent now.   Are you willing to proceed with your visit today?   [name] has provided verbal consent on 10/21/2021 for a virtual visit (video or telephone).   [name], [place] 10/21/2021  2:13 PM

## 2021-10-21 NOTE — Telephone Encounter (Signed)
Please follow up on this, she will need an appt for prescription medication

## 2021-10-25 ENCOUNTER — Ambulatory Visit (INDEPENDENT_AMBULATORY_CARE_PROVIDER_SITE_OTHER): Payer: Medicare Other | Admitting: Podiatry

## 2021-10-25 ENCOUNTER — Encounter: Payer: Self-pay | Admitting: Podiatry

## 2021-10-25 DIAGNOSIS — B351 Tinea unguium: Secondary | ICD-10-CM | POA: Diagnosis not present

## 2021-10-25 DIAGNOSIS — E669 Obesity, unspecified: Secondary | ICD-10-CM | POA: Diagnosis not present

## 2021-10-25 DIAGNOSIS — E1169 Type 2 diabetes mellitus with other specified complication: Secondary | ICD-10-CM

## 2021-10-25 DIAGNOSIS — L84 Corns and callosities: Secondary | ICD-10-CM

## 2021-10-25 DIAGNOSIS — M79674 Pain in right toe(s): Secondary | ICD-10-CM | POA: Diagnosis not present

## 2021-10-25 DIAGNOSIS — M79675 Pain in left toe(s): Secondary | ICD-10-CM | POA: Diagnosis not present

## 2021-10-27 DIAGNOSIS — H353221 Exudative age-related macular degeneration, left eye, with active choroidal neovascularization: Secondary | ICD-10-CM | POA: Diagnosis not present

## 2021-10-27 DIAGNOSIS — H401131 Primary open-angle glaucoma, bilateral, mild stage: Secondary | ICD-10-CM | POA: Diagnosis not present

## 2021-10-27 DIAGNOSIS — H26492 Other secondary cataract, left eye: Secondary | ICD-10-CM | POA: Diagnosis not present

## 2021-10-27 DIAGNOSIS — Z961 Presence of intraocular lens: Secondary | ICD-10-CM | POA: Diagnosis not present

## 2021-10-28 NOTE — Progress Notes (Signed)
  Subjective:  Patient ID: Mikayla [name], female    DOB: 06-21-1925,  MRN: [id number]  Chief Complaint  Patient presents with   Diabetes     np  diabetic with  thick toenails/ maybe one coming off/ neuropathy possilbe    88 y.o. female presents with the above complaint. History confirmed with patient.  Her feet are very sensitive the nails are misshapened and causing pain and they are also painful thick skin lesions that are also causing discomfort  Objective:  Physical Exam: warm, good capillary refill, no trophic changes or ulcerative lesions, normal DP and PT pulses, normal monofilament exam, normal sensory exam, and varicose veins were noted. Left Foot: dystrophic yellowed discolored nail plates with subungual debris and submetatarsal 1 and 5 hyperkeratotic lesion tender to touch Right Foot: dystrophic yellowed discolored nail plates with subungual debris, submetatarsal 5 hyperkeratotic lesion tender to touch Assessment:   1. Pain due to onychomycosis of toenails of both feet   2. Callus of foot   3. Diabetes mellitus type 2 in obese [facility])      Plan:  Patient was evaluated and treated and all questions answered.  Patient educated on diabetes. Discussed proper diabetic foot care and discussed risks and complications of disease. Educated patient in depth on reasons to return to the office immediately should he/she discover anything concerning or new on the feet. All questions answered. Discussed proper shoes as well.   Discussed the etiology and treatment options for the condition in detail with the patient. Educated patient on the topical and oral treatment options for mycotic nails. Recommended debridement of the nails today. Sharp and mechanical debridement performed of all painful and mycotic nails today. Nails debrided in length and thickness using a nail nipper to level of comfort. Discussed treatment options including appropriate shoe gear. Follow up as needed for painful  nails.   All symptomatic hyperkeratoses were safely debrided with a sterile #15 blade to patient's level of comfort without incident. We discussed preventative and palliative care of these lesions including supportive and accommodative shoegear, padding, prefabricated and custom molded accommodative orthoses, use of a pumice stone and lotions/creams daily.   Return in about 3 months (around 01/25/2022) for at risk diabetic foot care.

## 2021-11-01 ENCOUNTER — Ambulatory Visit (INDEPENDENT_AMBULATORY_CARE_PROVIDER_SITE_OTHER): Payer: Medicare Other | Admitting: Nurse Practitioner

## 2021-11-01 ENCOUNTER — Telehealth: Payer: Self-pay | Admitting: *Deleted

## 2021-11-01 DIAGNOSIS — M542 Cervicalgia: Secondary | ICD-10-CM | POA: Diagnosis not present

## 2021-11-01 NOTE — Progress Notes (Signed)
  This service is provided via telemedicine  No vital signs collected/recorded due to the encounter was a telemedicine visit.   Location of patient (ex: home, work):  home  Patient consents to a telephone visit:  Yes  Location of the provider (ex: office, home):  [facility]  Name of any referring provider:  Sherrie Mustache, NP  Names of all persons participating in the telemedicine service and their role in the encounter:  Patient, Mikayla [name], CMA and Sherrie Mustache, NP  Time spent on call:  7.15Minutes

## 2021-11-01 NOTE — Progress Notes (Signed)
Careteam: Patient Care Team: Lauree Chandler, NP as PCP - General (Geriatric Medicine) Katy Apo, MD as Consulting Physician (Ophthalmology)  Advanced Directive information    Allergies  Allergen Reactions   Amoxicillin Rash   Lipitor [Atorvastatin]     REACTION: aches   Statins     Myalgias     Chief Complaint  Patient presents with   Acute Visit    Complains of Neck Pain on the Left side since Wednesday. Cannot Chew anything hot because it brings pain. No Sore Throat, No Earache.      HPI: Patient is a 88 y.o. female due to neck pain for 6 days. Pain behind ear on the left side.  Unable to eat hot food. Reports she can chew but the heat of food hurts her neck.  No swelling or redness. No fever.  She is able to turn her head from side to side without pain  And able to look up and down without increasing the pain.  Reports pain is making her nauseous.  Reports pain 9/10 when medication wears off. She has been taking tylenol 250 mg and ibuprofen 125 mg combination for pain relief. Unable to read mg of what she is taking, daughter got her the bottle.  Reports a stabbing pain.  Has not worsen since it started.   Review of Systems:  Review of Systems  Constitutional:  Negative for chills, fever and weight loss.  HENT:  Negative for tinnitus.   Respiratory:  Negative for cough, sputum production and shortness of breath.   Cardiovascular:  Negative for chest pain, palpitations and leg swelling.  Gastrointestinal:  Negative for abdominal pain, constipation, diarrhea and heartburn.  Genitourinary:  Negative for dysuria, frequency and urgency.  Musculoskeletal:  Positive for myalgias and neck pain. Negative for falls.  Skin: Negative.   Neurological:  Negative for dizziness and headaches.  Psychiatric/Behavioral:  Negative for depression and memory loss. The patient does not have insomnia.     Past Medical History:  Diagnosis Date   Chronic rhinitis    CKD  (chronic kidney disease), stage III (HCC)    Disorder of bone and cartilage, unspecified    Essential hypertension, benign    Glaucoma    Per [facility] new patient packet   Hearing loss    Per [place] new patient packet   High blood pressure    Per [facility] new patient packet   High cholesterol    Per [place] new patient packet   Obesity, unspecified    Other and unspecified hyperlipidemia    Type II or unspecified type diabetes mellitus without mention of complication, not stated as uncontrolled    Vision loss    Due to catarct Per [place] new patient packet   Past Surgical History:  Procedure Laterality Date   CESAREAN SECTION N/A    ESOPHAGOGASTRODUODENOSCOPY N/A 04/29/2014   Procedure: ESOPHAGOGASTRODUODENOSCOPY (EGD);  Surgeon: Milus Banister, MD;  Location: [facility];  Service: Endoscopy;  Laterality: N/A;   TOTAL ABDOMINAL HYSTERECTOMY     Social History:   reports that she has never smoked. She has never used smokeless tobacco. She reports current alcohol use of about 7.0 standard drinks of alcohol per week. She reports that she does not use drugs.  Family History  Problem Relation Age of Onset   COPD Mother    Lung cancer Father    Prostate cancer Other    Multiple myeloma Son    Colon cancer Neg Hx  Esophageal cancer Neg Hx    Rectal cancer Neg Hx    Stomach cancer Neg Hx     Medications: Patient's Medications  New Prescriptions   No medications on file  Previous Medications   ACETAMINOPHEN (TYLENOL) 325 MG TABLET    Take 325 mg by mouth every 6 (six) hours as needed for mild pain or fever.    ALBUTEROL (VENTOLIN HFA) 108 (90 BASE) MCG/ACT INHALER    Inhale 2 puffs into the lungs every 6 (six) hours as needed for wheezing or shortness of breath.   AMLODIPINE (NORVASC) 10 MG TABLET    TAKE 1 TABLET BY MOUTH DAILY   ASCORBIC ACID (VITAMIN C) 1000 MG TABLET    Take 1 tablet (1,000 mg total) by mouth 2 (two) times daily.   ASPIRIN 81 MG TABLET    Take 81 mg by mouth daily.    BISOPROLOL (ZEBETA) 5 MG TABLET    Take 5 mg by mouth daily.   BUDESONIDE-FORMOTEROL (SYMBICORT) 80-4.5 MCG/ACT INHALER    Inhale 2 puffs into the lungs 2 (two) times daily as needed (shortness of breath/wheezing).   CHOLECALCIFEROL (VITAMIN D) 1000 UNITS CAPSULE    Take 1,000 Units by mouth daily.   DEXTROMETHORPHAN-GUAIFENESIN (MUCINEX DM) 30-600 MG 12HR TABLET    Take 1 tablet by mouth 2 (two) times daily.   DORZOLAMIDE-TIMOLOL (COSOPT) 22.3-6.8 MG/ML OPHTHALMIC SOLUTION    Place 1 drop into both eyes 2 (two) times daily.    FAMOTIDINE (PEPCID) 20 MG TABLET    Take 20 mg by mouth at bedtime as needed for heartburn (COUGH FLARE).    FUROSEMIDE (LASIX) 20 MG TABLET    TAKE 1 TABLET BY MOUTH DAILY.   LEVOTHYROXINE (SYNTHROID) 50 MCG TABLET    TAKE 1 TABLET BY MOUTH DAILY BEFORE BREAKFAST.   METFORMIN (GLUCOPHAGE) 500 MG TABLET    Take 1 tablet (500 mg total) by mouth daily with breakfast.   MULTIPLE VITAMINS-MINERALS (CENTRUM SILVER PO)    Take 1 tablet by mouth daily.  Modified Medications   No medications on file  Discontinued Medications   No medications on file    Physical Exam:  There were no vitals filed for this visit. There is no height or weight on file to calculate BMI. Wt Readings from Last 3 Encounters:  09/09/21 132 lb (59.9 kg)  06/21/21 132 lb (59.9 kg)  12/16/20 133 lb (60.3 kg)    Physical Exam  Labs reviewed: Basic Metabolic Panel: Recent Labs    12/16/20 1110 06/21/21 1035  NA 137 136  K 4.7 4.2  CL 102 101  CO2 26 28  GLUCOSE 142* 137*  BUN 33* 43*  CREATININE 1.35* 1.42*  CALCIUM 9.4 9.4  TSH  --  1.93   Liver Function Tests: Recent Labs    06/21/21 1035  AST 12  ALT 8  ALKPHOS 34*  BILITOT 0.5  PROT 7.1  ALBUMIN 4.1   No results for input(s): "LIPASE", "AMYLASE" in the last 8760 hours. No results for input(s): "AMMONIA" in the last 8760 hours. CBC: Recent Labs    06/21/21 1035  WBC 8.0  NEUTROABS 5.3  HGB 11.2*  HCT 33.6*  MCV  88.7  PLT 211.0   Lipid Panel: Recent Labs    06/21/21 1035  CHOL 291*  HDL 44.00  TRIG 274.0*  CHOLHDL 7  LDLDIRECT 156.0   TSH: Recent Labs    06/21/21 1035  TSH 1.93   A1C: Lab Results  Component Value Date  HGBA1C 6.8 (H) 06/21/2021     Assessment/Plan 1. Neck pain Recommended evaluation in office to further evaluate, she feels like this is muscular but reports no pain with movement.  -she has CKD, advised to avoid NSAIDs.  -tylenol 1000 mg every 8 hours as needed pain at this time -will follow up in office tomorrow at 2:40 pm with Webb Silversmith, NP  Carlos American. Harle Battiest  [facility] [phone]    Virtual Visit via telephone  I connected with patient on 11/01/21 at  2:40 PM EDT by telelphone and verified that I am speaking with the correct person using two identifiers.  Location: Patient: home Provider: [place]   I discussed the limitations, risks, security and privacy concerns of performing an evaluation and management service by telephone and the availability of in person appointments. I also discussed with the patient that there may be a patient responsible charge related to this service. The patient expressed understanding and agreed to proceed.   I discussed the assessment and treatment plan with the patient. The patient was provided an opportunity to ask questions and all were answered. The patient agreed with the plan and demonstrated an understanding of the instructions.   The patient was advised to call back or seek an in-person evaluation if the symptoms worsen or if the condition fails to improve as anticipated.  I provided 20 minutes of non-face-to-face time during this encounter.  Carlos American. Harle Battiest Avs printed and mailed

## 2021-11-01 NOTE — Telephone Encounter (Signed)
Ms. dallys, nowakowski are scheduled for a virtual visit with your provider today.    Just as we do with appointments in the office, we must obtain your consent to participate.  Your consent will be active for this visit and any virtual visit you Amisadai Woodford have with one of our providers in the next 365 days.    If you have a MyChart account, I can also send a copy of this consent to you electronically.  All virtual visits are billed to your insurance company just like a traditional visit in the office.  As this is a virtual visit, video technology does not allow for your provider to perform a traditional examination.  This Shandell Jallow limit your provider's ability to fully assess your condition.  If your provider identifies any concerns that need to be evaluated in person or the need to arrange testing such as labs, EKG, etc, we will make arrangements to do so.    Although advances in technology are sophisticated, we cannot ensure that it will always work on either your end or our end.  If the connection with a video visit is poor, we Evana Runnels have to switch to a telephone visit.  With either a video or telephone visit, we are not always able to ensure that we have a secure connection.   I need to obtain your verbal consent now.   Are you willing to proceed with your visit today?   LAKEVIA PERRIS has provided verbal consent on 11/01/2021 for a virtual visit (video or telephone).   MayAlbertina Sen[place], [place] 11/01/2021  2:28 PM

## 2021-11-02 ENCOUNTER — Ambulatory Visit (INDEPENDENT_AMBULATORY_CARE_PROVIDER_SITE_OTHER): Payer: Medicare Other | Admitting: Family

## 2021-11-02 ENCOUNTER — Encounter: Payer: Self-pay | Admitting: Family

## 2021-11-02 VITALS — BP 134/58 | HR 57 | Temp 97.3°F | Resp 18 | Ht 58.5 in | Wt 126.6 lb

## 2021-11-02 DIAGNOSIS — M542 Cervicalgia: Secondary | ICD-10-CM

## 2021-11-02 MED ORDER — METHOCARBAMOL 500 MG PO TABS: 250.0000 mg | ORAL_TABLET | Freq: Three times a day (TID) | ORAL | 0 refills | Status: DC | PRN

## 2021-11-02 NOTE — Progress Notes (Unsigned)
Provider: Tationa Stech FNP-C  Mikayla Chandler, NP  Patient Care Team: Mikayla Chandler, NP as PCP - General (Geriatric Medicine) Mikayla Apo, MD as Consulting Physician (Ophthalmology)  Extended Emergency Contact Information Primary Emergency Contact: [facility] Phone: [phone] Relation: Daughter  Code Status:  Full Code  Goals of care: Advanced Directive information    11/02/2021   12:59 PM  Advanced Directives  Does Patient Have a Medical Advance Directive? Yes  Type of Paramedic of [place];Living will  Does patient want to make changes to medical advance directive? No - Patient declined  Copy of [place] in Chart? No - copy requested     Chief Complaint  Patient presents with   Acute Visit    Patient is here for neck pain for about a week woke up with the pain    HPI:  Pt is a 88 y.o. female seen today for an acute visit for evaluation of neck pain x 1 week watch TV with neck turned    Past Medical History:  Diagnosis Date   Chronic rhinitis    CKD (chronic kidney disease), stage III (HCC)    Disorder of bone and cartilage, unspecified    Essential hypertension, benign    Glaucoma    Per [facility] new patient packet   Hearing loss    Per [place] new patient packet   High blood pressure    Per [facility] new patient packet   High cholesterol    Per [place] new patient packet   Obesity, unspecified    Other and unspecified hyperlipidemia    Type II or unspecified type diabetes mellitus without mention of complication, not stated as uncontrolled    Vision loss    Due to catarct Per [place] new patient packet   Past Surgical History:  Procedure Laterality Date   CESAREAN SECTION N/A    ESOPHAGOGASTRODUODENOSCOPY N/A 04/29/2014   Procedure: ESOPHAGOGASTRODUODENOSCOPY (EGD);  Surgeon: Milus Banister, MD;  Location: [facility];  Service: Endoscopy;  Laterality: N/A;   TOTAL ABDOMINAL HYSTERECTOMY       Allergies  Allergen Reactions   Amoxicillin Rash   Lipitor [Atorvastatin]     REACTION: aches   Statins     Myalgias     Outpatient Encounter Medications as of 11/02/2021  Medication Sig   acetaminophen (TYLENOL) 325 MG tablet Take 325 mg by mouth every 6 (six) hours as needed for mild pain or fever.    albuterol (VENTOLIN HFA) 108 (90 Base) MCG/ACT inhaler Inhale 2 puffs into the lungs every 6 (six) hours as needed for wheezing or shortness of breath.   amLODipine (NORVASC) 10 MG tablet TAKE 1 TABLET BY MOUTH DAILY   Ascorbic Acid (VITAMIN C) 1000 MG tablet Take 1 tablet (1,000 mg total) by mouth 2 (two) times daily.   aspirin 81 MG tablet Take 81 mg by mouth daily.   bisoprolol (ZEBETA) 5 MG tablet Take 5 mg by mouth daily.   budesonide-formoterol (SYMBICORT) 80-4.5 MCG/ACT inhaler Inhale 2 puffs into the lungs 2 (two) times daily as needed (shortness of breath/wheezing).   Cholecalciferol (VITAMIN D) 1000 UNITS capsule Take 1,000 Units by mouth daily.   dorzolamide-timolol (COSOPT) 22.3-6.8 MG/ML ophthalmic solution Place 1 drop into both eyes 2 (two) times daily.    famotidine (PEPCID) 20 MG tablet Take 20 mg by mouth at bedtime as needed for heartburn (COUGH FLARE).    furosemide (LASIX) 20 MG tablet TAKE 1 TABLET BY MOUTH  DAILY.   levothyroxine (SYNTHROID) 50 MCG tablet TAKE 1 TABLET BY MOUTH DAILY BEFORE BREAKFAST.   metFORMIN (GLUCOPHAGE) 500 MG tablet Take 1 tablet (500 mg total) by mouth daily with breakfast.   Multiple Vitamins-Minerals (CENTRUM SILVER PO) Take 1 tablet by mouth daily.   [DISCONTINUED] dextromethorphan-guaiFENesin (MUCINEX DM) 30-600 MG 12hr tablet Take 1 tablet by mouth 2 (two) times daily. (Patient not taking: Reported on 11/01/2021)   No facility-administered encounter medications on file as of 11/02/2021.    Review of Systems  Musculoskeletal:  Positive for neck pain.    Immunization History  Administered Date(s) Administered   Fluad Quad(high  Dose 65+) 02/25/2019, 04/13/2020, 06/21/2021   Influenza Split 01/25/2011, 01/31/2012   Influenza Whole 02/28/2007, 04/02/2008, 03/12/2009, 02/10/2010   Influenza, High Dose Seasonal PF 02/01/2016, 03/13/2017, 01/29/2018   Influenza,inj,Quad PF,6+ Mos 04/02/2013, 03/03/2014, 04/20/2015   PFIZER(Purple Top)SARS-COV-2 Vaccination 06/19/2019, 07/14/2019   Pneumococcal Conjugate-13 03/03/2014   Pneumococcal Polysaccharide-23 06/04/2012   Pneumococcal-Unspecified 09/12/2000   Td 08/13/2006   Tdap 12/05/2016   Pertinent  Health Maintenance Due  Topic Date Due   FOOT EXAM  Never done   OPHTHALMOLOGY EXAM  Never done   DEXA SCAN  Never done   URINE MICROALBUMIN  03/04/2015   INFLUENZA VACCINE  12/13/2021   HEMOGLOBIN A1C  12/19/2021      05/25/2019    9:10 PM 05/26/2019    8:00 AM 09/09/2021   10:48 AM 10/18/2021    9:55 AM 11/02/2021    2:44 PM  Fall Risk  Falls in the past year?   0 0 0  Was there an injury with Fall?   0 0 0  Fall Risk Category Calculator   0 0 0  Fall Risk Category   Low Low Low  Patient Fall Risk Level Low fall risk Low fall risk Low fall risk Low fall risk Low fall risk  Patient at Risk for Falls Due to   No Fall Risks No Fall Risks No Fall Risks  Fall risk Follow up   Falls evaluation completed Falls evaluation completed Falls evaluation completed   Functional Status Survey:    Vitals:   11/02/21 1445  BP: (!) 134/58  Pulse: (!) 57  Resp: 18  Temp: (!) 97.3 F (36.3 C)  TempSrc: Temporal  SpO2: 97%  Weight: 126 lb 9.6 oz (57.4 kg)  Height: 4' 10.5" (1.486 m)   Body mass index is 26.01 kg/m. Physical Exam  Labs reviewed: Recent Labs    12/16/20 1110 06/21/21 1035  NA 137 136  K 4.7 4.2  CL 102 101  CO2 26 28  GLUCOSE 142* 137*  BUN 33* 43*  CREATININE 1.35* 1.42*  CALCIUM 9.4 9.4   Recent Labs    06/21/21 1035  AST 12  ALT 8  ALKPHOS 34*  BILITOT 0.5  PROT 7.1  ALBUMIN 4.1   Recent Labs    06/21/21 1035  WBC 8.0  NEUTROABS  5.3  HGB 11.2*  HCT 33.6*  MCV 88.7  PLT 211.0   Lab Results  Component Value Date   TSH 1.93 06/21/2021   Lab Results  Component Value Date   HGBA1C 6.8 (H) 06/21/2021   Lab Results  Component Value Date   CHOL 291 (H) 06/21/2021   HDL 44.00 06/21/2021   LDLCALC 125 (H) 03/13/2017   LDLDIRECT 156.0 06/21/2021   TRIG 274.0 (H) 06/21/2021   CHOLHDL 7 06/21/2021    Significant Diagnostic Results in last 30 days:  No  results found.  Assessment/Plan There are no diagnoses linked to this encounter.   Family/ staff Communication: Reviewed plan of care with patient  Labs/tests ordered: None   Next Appointment:   Sandrea Hughs, NP

## 2021-11-02 NOTE — Patient Instructions (Signed)
-   apply warm wet wash cloth to left neck for 10-15 minutes three times daily to relief pain.  - Notify provider of symptoms worsen or fail to improve

## 2021-11-03 ENCOUNTER — Ambulatory Visit: Payer: Medicare Other | Admitting: Family

## 2021-11-29 DIAGNOSIS — H3582 Retinal ischemia: Secondary | ICD-10-CM | POA: Diagnosis not present

## 2021-11-29 DIAGNOSIS — H353221 Exudative age-related macular degeneration, left eye, with active choroidal neovascularization: Secondary | ICD-10-CM | POA: Diagnosis not present

## 2021-11-29 DIAGNOSIS — H35033 Hypertensive retinopathy, bilateral: Secondary | ICD-10-CM | POA: Diagnosis not present

## 2021-11-29 DIAGNOSIS — H26492 Other secondary cataract, left eye: Secondary | ICD-10-CM | POA: Diagnosis not present

## 2021-11-29 DIAGNOSIS — H3562 Retinal hemorrhage, left eye: Secondary | ICD-10-CM | POA: Diagnosis not present

## 2021-12-13 DIAGNOSIS — H353221 Exudative age-related macular degeneration, left eye, with active choroidal neovascularization: Secondary | ICD-10-CM | POA: Diagnosis not present

## 2021-12-27 ENCOUNTER — Encounter: Payer: Self-pay | Admitting: Podiatry

## 2022-01-02 ENCOUNTER — Ambulatory Visit: Payer: Medicare Other | Admitting: Nurse Practitioner

## 2022-01-13 ENCOUNTER — Encounter: Payer: Self-pay | Admitting: Nurse Practitioner

## 2022-01-13 ENCOUNTER — Ambulatory Visit (INDEPENDENT_AMBULATORY_CARE_PROVIDER_SITE_OTHER): Payer: Medicare Other | Admitting: Nurse Practitioner

## 2022-01-13 VITALS — BP 152/76 | HR 80 | Temp 97.9°F | Ht 58.5 in | Wt 121.0 lb

## 2022-01-13 DIAGNOSIS — D649 Anemia, unspecified: Secondary | ICD-10-CM

## 2022-01-13 DIAGNOSIS — E1122 Type 2 diabetes mellitus with diabetic chronic kidney disease: Secondary | ICD-10-CM

## 2022-01-13 DIAGNOSIS — E039 Hypothyroidism, unspecified: Secondary | ICD-10-CM

## 2022-01-13 DIAGNOSIS — J4520 Mild intermittent asthma, uncomplicated: Secondary | ICD-10-CM

## 2022-01-13 DIAGNOSIS — N1832 Chronic kidney disease, stage 3b: Secondary | ICD-10-CM

## 2022-01-13 DIAGNOSIS — M546 Pain in thoracic spine: Secondary | ICD-10-CM

## 2022-01-13 MED ORDER — PREDNISONE 10 MG (21) PO TBPK: ORAL_TABLET | ORAL | 0 refills | Status: DC

## 2022-01-13 NOTE — Patient Instructions (Addendum)
  To get prednisone and take as directed for pain  You can continue robaxin and tylenol if you need for pain   Please sign record release for Dr Prudencio Burly (ophthalmology) for diabetic eye exam

## 2022-01-13 NOTE — Progress Notes (Signed)
Careteam: Patient Care Team: Lauree Chandler, NP as PCP - General (Geriatric Medicine) Katy Apo, MD as Consulting Physician (Ophthalmology)  PLACE OF SERVICE:  [place] Directive information Does Patient Have a Medical Advance Directive?: Yes, Type of Advance Directive: [place];Living will, Does patient want to make changes to medical advance directive?: No - Patient declined  Allergies  Allergen Reactions   Amoxicillin Rash   Lipitor [Atorvastatin]     REACTION: aches   Statins     Myalgias     Chief Complaint  Patient presents with   Medical Management of Chronic Issues    4 month follow-up and foot exam today. Discuss need for eye exam, covid boosters, flu vaccine (out of stock) and a1c or post pone if patient refuses. NCIR verified.      HPI: Patient is a 88 y.o. female having back issues.   She did a lot of heavy cleaning 2 weeks ago.  She started having severe pain 2 days later. She remembered she had robaxin every 8 hours hours which she had from her neck hurting and it has helped.  She needs to take tylenol with the robaxin to help make pain tolerable.  Feels like she may have pulled a muscle.  Pain is a 9/10 when the medication wears off.  Pain radiating into her front and neck.   HTN: generally controlled at home  Asthma:has not needed rescue inhaler, no recent exacerbation.   DM:no hypoglycemia symptoms noted, does not take blood sugars at home.   Review of Systems:  Review of Systems  Constitutional:  Negative for chills, fever and weight loss.  HENT:  Positive for hearing loss. Negative for tinnitus.   Respiratory:  Negative for cough, sputum production and shortness of breath.   Cardiovascular:  Negative for chest pain, palpitations and leg swelling.  Gastrointestinal:  Negative for abdominal pain, constipation, diarrhea and heartburn.  Genitourinary:  Negative for dysuria, frequency and urgency.   Musculoskeletal:  Positive for back pain. Negative for falls, joint pain and myalgias.  Skin: Negative.   Neurological:  Negative for dizziness and headaches.  Psychiatric/Behavioral:  Negative for depression and memory loss. The patient does not have insomnia.     Past Medical History:  Diagnosis Date   Chronic rhinitis    CKD (chronic kidney disease), stage III (HCC)    Disorder of bone and cartilage, unspecified    Essential hypertension, benign    Glaucoma    Per [facility] new patient packet   Hearing loss    Per [place] new patient packet   High blood pressure    Per [facility] new patient packet   High cholesterol    Per [place] new patient packet   Obesity, unspecified    Other and unspecified hyperlipidemia    Type II or unspecified type diabetes mellitus without mention of complication, not stated as uncontrolled    Vision loss    Due to catarct Per [place] new patient packet   Past Surgical History:  Procedure Laterality Date   CESAREAN SECTION N/A    ESOPHAGOGASTRODUODENOSCOPY N/A 04/29/2014   Procedure: ESOPHAGOGASTRODUODENOSCOPY (EGD);  Surgeon: Milus Banister, MD;  Location: [facility];  Service: Endoscopy;  Laterality: N/A;   TOTAL ABDOMINAL HYSTERECTOMY     Social History:   reports that she has never smoked. She has never used smokeless tobacco. She reports current alcohol use of about 7.0 standard drinks of alcohol per week. She reports that she  does not use drugs.  Family History  Problem Relation Age of Onset   COPD Mother    Lung cancer Father    Prostate cancer Other    Multiple myeloma Son    Colon cancer Neg Hx    Esophageal cancer Neg Hx    Rectal cancer Neg Hx    Stomach cancer Neg Hx     Medications: Patient's Medications  New Prescriptions   No medications on file  Previous Medications   ACETAMINOPHEN (TYLENOL) 500 MG TABLET    Take 500 mg by mouth as needed.   ALBUTEROL (VENTOLIN HFA) 108 (90 BASE) MCG/ACT INHALER    Inhale 2 puffs into the lungs every 6  (six) hours as needed for wheezing or shortness of breath.   AMLODIPINE (NORVASC) 10 MG TABLET    TAKE 1 TABLET BY MOUTH DAILY   ASPIRIN 81 MG TABLET    Take 81 mg by mouth daily.   BISOPROLOL (ZEBETA) 5 MG TABLET    Take 5 mg by mouth daily.   BUDESONIDE-FORMOTEROL (SYMBICORT) 80-4.5 MCG/ACT INHALER    Inhale 2 puffs into the lungs 2 (two) times daily as needed (shortness of breath/wheezing).   CHOLECALCIFEROL (VITAMIN D) 1000 UNITS CAPSULE    Take 1,000 Units by mouth daily.   CYCLOBENZAPRINE (FLEXERIL) 10 MG TABLET    Take 10 mg by mouth 3 (three) times daily as needed for muscle spasms.   DEXTROMETHORPHAN (DELSYM) 30 MG/5ML LIQUID    Take 30 mg by mouth as needed for cough.   DORZOLAMIDE-TIMOLOL (COSOPT) 22.3-6.8 MG/ML OPHTHALMIC SOLUTION    Place 1 drop into both eyes 2 (two) times daily.    FAMOTIDINE (PEPCID) 20 MG TABLET    Take 20 mg by mouth at bedtime as needed for heartburn (COUGH FLARE).    FUROSEMIDE (LASIX) 20 MG TABLET    TAKE 1 TABLET BY MOUTH DAILY.   LEVOTHYROXINE (SYNTHROID) 50 MCG TABLET    TAKE 1 TABLET BY MOUTH DAILY BEFORE BREAKFAST.   LORATADINE (CLARITIN) 10 MG TABLET    Take 10 mg by mouth as needed for allergies.   MECLIZINE (ANTIVERT) 12.5 MG TABLET    Take 12.5 mg by mouth as needed for dizziness.   METFORMIN (GLUCOPHAGE) 500 MG TABLET    Take 1 tablet (500 mg total) by mouth daily with breakfast.   METHOCARBAMOL (ROBAXIN) 500 MG TABLET    Take 0.5 tablets (250 mg total) by mouth every 8 (eight) hours as needed for muscle spasms.   MULTIPLE VITAMINS-MINERALS (CENTRUM SILVER PO)    Take 1 tablet by mouth daily.  Modified Medications   No medications on file  Discontinued Medications   ACETAMINOPHEN (TYLENOL) 325 MG TABLET    Take 325 mg by mouth every 6 (six) hours as needed for mild pain or fever.    ASCORBIC ACID (VITAMIN C) 1000 MG TABLET    Take 1 tablet (1,000 mg total) by mouth 2 (two) times daily.    Physical Exam:  Vitals:   01/13/22 1056  BP: (!)  152/76  Pulse: 80  Temp: 97.9 F (36.6 C)  TempSrc: Temporal  SpO2: 99%  Weight: 121 lb (54.9 kg)  Height: 4' 10.5" (1.486 m)   Body mass index is 24.86 kg/m. Wt Readings from Last 3 Encounters:  01/13/22 121 lb (54.9 kg)  11/02/21 126 lb 9.6 oz (57.4 kg)  09/09/21 132 lb (59.9 kg)    Physical Exam Constitutional:      General: She is not  in acute distress.    Appearance: She is well-developed. She is not diaphoretic.  HENT:     Head: Normocephalic and atraumatic.     Right Ear: Tympanic membrane, ear canal and external ear normal.     Left Ear: Tympanic membrane, ear canal and external ear normal.     Mouth/Throat:     Pharynx: No oropharyngeal exudate.  Eyes:     Conjunctiva/sclera: Conjunctivae normal.     Pupils: Pupils are equal, round, and reactive to light.  Cardiovascular:     Rate and Rhythm: Normal rate and regular rhythm.     Heart sounds: Normal heart sounds.  Pulmonary:     Effort: Pulmonary effort is normal.     Breath sounds: Normal breath sounds.  Abdominal:     General: Bowel sounds are normal.     Palpations: Abdomen is soft.  Musculoskeletal:     Cervical back: Normal range of motion and neck supple.     Right lower leg: No edema.     Left lower leg: No edema.  Skin:    General: Skin is warm and dry.  Neurological:     Mental Status: She is alert and oriented to person, place, and time.  Psychiatric:        Mood and Affect: Mood normal.     Labs reviewed: Basic Metabolic Panel: Recent Labs    06/21/21 1035  NA 136  K 4.2  CL 101  CO2 28  GLUCOSE 137*  BUN 43*  CREATININE 1.42*  CALCIUM 9.4  TSH 1.93   Liver Function Tests: Recent Labs    06/21/21 1035  AST 12  ALT 8  ALKPHOS 34*  BILITOT 0.5  PROT 7.1  ALBUMIN 4.1   No results for input(s): "LIPASE", "AMYLASE" in the last 8760 hours. No results for input(s): "AMMONIA" in the last 8760 hours. CBC: Recent Labs    06/21/21 1035  WBC 8.0  NEUTROABS 5.3  HGB 11.2*   HCT 33.6*  MCV 88.7  PLT 211.0   Lipid Panel: Recent Labs    06/21/21 1035  CHOL 291*  HDL 44.00  TRIG 274.0*  CHOLHDL 7  LDLDIRECT 156.0   TSH: Recent Labs    06/21/21 1035  TSH 1.93   A1C: Lab Results  Component Value Date   HGBA1C 6.8 (H) 06/21/2021     Assessment/Plan 1. Acute bilateral thoracic back pain -she has good relief with robaxin and tylenol however when medication wears off pain is 9/10, will add prednisone and she can continue to use tylenol PRN. To use caution with muscle relaxer due to side effects. She will follow up if pain worsens or fails to improve.  - predniSONE (STERAPRED UNI-PAK 21 TAB) 10 MG (21) TBPK tablet; Use as directed  Dispense: 21 tablet; Refill: 0  2. Stage 3b chronic kidney disease (HCC) -Chronic and stable Encourage proper hydration Follow metabolic panel Avoid nephrotoxic meds (NSAIDS) - CMP with eGFR([facility])  3. Anemia, normocytic normochromic - no signs of blood loss, will follow up  - CBC with Differential/Platelet  4. Hypothyroidism, acquired Tsh stable on last labs, continues on synthroid mcg   5. Mild intermittent extrinsic asthma without complication -well controlled does not use symbicort routinely   6. Type 2 diabetes mellitus with stage 3b chronic kidney disease, without long-term current use of insulin ([place]) -Encouraged dietary compliance, routine foot care/monitoring and to keep up with diabetic eye exams through ophthalmology  -continues on metformin daily, no hypoglycemia.  -  Hemoglobin A1c - Microalbumin, urine   Return in about 6 months (around 07/14/2022) for routine follow up . [address]. [place], [facility] [phone]

## 2022-01-14 LAB — COMPLETE METABOLIC PANEL WITH GFR
AG Ratio: 1.6 (calc) (ref 1.0–2.5)
ALT: 7 U/L (ref 6–29)
AST: 12 U/L (ref 10–35)
Albumin: 4.5 g/dL (ref 3.6–5.1)
Alkaline phosphatase (APISO): 38 U/L (ref 37–153)
BUN/Creatinine Ratio: 28 (calc) — ABNORMAL HIGH (ref 6–22)
BUN: 43 mg/dL — ABNORMAL HIGH (ref 7–25)
CO2: 25 mmol/L (ref 20–32)
Calcium: 9.8 mg/dL (ref 8.6–10.4)
Chloride: 95 mmol/L — ABNORMAL LOW (ref 98–110)
Creat: 1.54 mg/dL — ABNORMAL HIGH (ref 0.60–0.95)
Globulin: 2.9 g/dL (calc) (ref 1.9–3.7)
Glucose, Bld: 138 mg/dL — ABNORMAL HIGH (ref 65–99)
Potassium: 4.6 mmol/L (ref 3.5–5.3)
Sodium: 132 mmol/L — ABNORMAL LOW (ref 135–146)
Total Bilirubin: 0.4 mg/dL (ref 0.2–1.2)
Total Protein: 7.4 g/dL (ref 6.1–8.1)
eGFR: 31 mL/min/{1.73_m2} — ABNORMAL LOW (ref >=60)

## 2022-01-14 LAB — CBC WITH DIFFERENTIAL/PLATELET
Absolute Monocytes: 680 cells/uL (ref 200–950)
Basophils Absolute: 17 cells/uL (ref 0–200)
Basophils Relative: 0.2 %
Eosinophils Absolute: 153 cells/uL (ref 15–500)
Eosinophils Relative: 1.8 %
HCT: 37.5 % (ref 35.0–45.0)
Hemoglobin: 12.6 g/dL (ref 11.7–15.5)
Lymphs Abs: 2049 cells/uL (ref 850–3900)
MCH: 29.5 pg (ref 27.0–33.0)
MCHC: 33.6 g/dL (ref 32.0–36.0)
MCV: 87.8 fL (ref 80.0–100.0)
MPV: 10.1 fL (ref 7.5–12.5)
Monocytes Relative: 8 %
Neutro Abs: 5602 cells/uL (ref 1500–7800)
Neutrophils Relative %: 65.9 %
Platelets: 239 10*3/uL (ref 140–400)
RBC: 4.27 10*6/uL (ref 3.80–5.10)
RDW: 13.5 % (ref 11.0–15.0)
Total Lymphocyte: 24.1 %
WBC: 8.5 10*3/uL (ref 3.8–10.8)

## 2022-01-14 LAB — HEMOGLOBIN A1C
Hgb A1c MFr Bld: 6.4 % of total Hgb — ABNORMAL HIGH (ref <5.7)
Mean Plasma Glucose: 137 mg/dL
eAG (mmol/L): 7.6 mmol/L

## 2022-01-14 LAB — MICROALBUMIN, URINE: Microalb, Ur: 0.5 mg/dL

## 2022-01-15 ENCOUNTER — Encounter (HOSPITAL_COMMUNITY)
Admission: EM | Disposition: A | Discharge status: Home / Self Care | Payer: Self-pay | Source: Home / Self Care | Attending: Cardiology

## 2022-01-15 ENCOUNTER — Emergency Department (HOSPITAL_BASED_OUTPATIENT_CLINIC_OR_DEPARTMENT_OTHER): Payer: Medicare Other

## 2022-01-15 ENCOUNTER — Inpatient Hospital Stay (HOSPITAL_BASED_OUTPATIENT_CLINIC_OR_DEPARTMENT_OTHER)
Admission: EM | Admit: 2022-01-15 | Discharge: 2022-01-17 | DRG: 246 | Disposition: A | Discharge status: Home / Self Care | Payer: Medicare Other | Attending: Cardiology | Admitting: Cardiology

## 2022-01-15 ENCOUNTER — Other Ambulatory Visit: Payer: Self-pay

## 2022-01-15 DIAGNOSIS — H409 Unspecified glaucoma: Secondary | ICD-10-CM | POA: Diagnosis present

## 2022-01-15 DIAGNOSIS — Z79899 Other long term (current) drug therapy: Secondary | ICD-10-CM

## 2022-01-15 DIAGNOSIS — H9190 Unspecified hearing loss, unspecified ear: Secondary | ICD-10-CM | POA: Diagnosis present

## 2022-01-15 DIAGNOSIS — I5033 Acute on chronic diastolic (congestive) heart failure: Secondary | ICD-10-CM | POA: Diagnosis present

## 2022-01-15 DIAGNOSIS — Z880 Allergy status to penicillin: Secondary | ICD-10-CM

## 2022-01-15 DIAGNOSIS — I130 Hypertensive heart and chronic kidney disease with heart failure and stage 1 through stage 4 chronic kidney disease, or unspecified chronic kidney disease: Secondary | ICD-10-CM | POA: Diagnosis present

## 2022-01-15 DIAGNOSIS — N1831 Chronic kidney disease, stage 3a: Secondary | ICD-10-CM | POA: Diagnosis not present

## 2022-01-15 DIAGNOSIS — Z888 Allergy status to other drugs, medicaments and biological substances status: Secondary | ICD-10-CM

## 2022-01-15 DIAGNOSIS — Z79890 Hormone replacement therapy (postmenopausal): Secondary | ICD-10-CM

## 2022-01-15 DIAGNOSIS — Z7982 Long term (current) use of aspirin: Secondary | ICD-10-CM | POA: Diagnosis not present

## 2022-01-15 DIAGNOSIS — E1122 Type 2 diabetes mellitus with diabetic chronic kidney disease: Secondary | ICD-10-CM | POA: Diagnosis not present

## 2022-01-15 DIAGNOSIS — E871 Hypo-osmolality and hyponatremia: Secondary | ICD-10-CM | POA: Diagnosis not present

## 2022-01-15 DIAGNOSIS — E782 Mixed hyperlipidemia: Secondary | ICD-10-CM | POA: Diagnosis not present

## 2022-01-15 DIAGNOSIS — R0789 Other chest pain: Secondary | ICD-10-CM | POA: Diagnosis not present

## 2022-01-15 DIAGNOSIS — M549 Dorsalgia, unspecified: Secondary | ICD-10-CM | POA: Diagnosis not present

## 2022-01-15 DIAGNOSIS — I2119 ST elevation (STEMI) myocardial infarction involving other coronary artery of inferior wall: Secondary | ICD-10-CM | POA: Diagnosis present

## 2022-01-15 DIAGNOSIS — Z7984 Long term (current) use of oral hypoglycemic drugs: Secondary | ICD-10-CM | POA: Diagnosis not present

## 2022-01-15 DIAGNOSIS — I129 Hypertensive chronic kidney disease with stage 1 through stage 4 chronic kidney disease, or unspecified chronic kidney disease: Secondary | ICD-10-CM | POA: Diagnosis not present

## 2022-01-15 DIAGNOSIS — J439 Emphysema, unspecified: Secondary | ICD-10-CM | POA: Diagnosis not present

## 2022-01-15 DIAGNOSIS — I213 ST elevation (STEMI) myocardial infarction of unspecified site: Principal | ICD-10-CM

## 2022-01-15 DIAGNOSIS — R079 Chest pain, unspecified: Secondary | ICD-10-CM | POA: Diagnosis not present

## 2022-01-15 DIAGNOSIS — I6522 Occlusion and stenosis of left carotid artery: Secondary | ICD-10-CM | POA: Diagnosis not present

## 2022-01-15 DIAGNOSIS — Z886 Allergy status to analgesic agent status: Secondary | ICD-10-CM | POA: Diagnosis not present

## 2022-01-15 DIAGNOSIS — I340 Nonrheumatic mitral (valve) insufficiency: Secondary | ICD-10-CM | POA: Diagnosis not present

## 2022-01-15 DIAGNOSIS — I700 Atherosclerosis of aorta: Secondary | ICD-10-CM | POA: Diagnosis not present

## 2022-01-15 DIAGNOSIS — I240 Acute coronary thrombosis not resulting in myocardial infarction: Secondary | ICD-10-CM

## 2022-01-15 DIAGNOSIS — I2111 ST elevation (STEMI) myocardial infarction involving right coronary artery: Secondary | ICD-10-CM | POA: Diagnosis not present

## 2022-01-15 DIAGNOSIS — I499 Cardiac arrhythmia, unspecified: Secondary | ICD-10-CM | POA: Diagnosis not present

## 2022-01-15 DIAGNOSIS — Z955 Presence of coronary angioplasty implant and graft: Secondary | ICD-10-CM

## 2022-01-15 DIAGNOSIS — I10 Essential (primary) hypertension: Secondary | ICD-10-CM | POA: Diagnosis not present

## 2022-01-15 HISTORY — PX: CORONARY/GRAFT ACUTE MI REVASCULARIZATION: CATH118305

## 2022-01-15 HISTORY — PX: LEFT HEART CATH AND CORONARY ANGIOGRAPHY: CATH118249

## 2022-01-15 LAB — TROPONIN I (HIGH SENSITIVITY): Troponin I (High Sensitivity): 18120 ng/L (ref <18)

## 2022-01-15 LAB — POCT ACTIVATED CLOTTING TIME: Activated Clotting Time: 305 seconds

## 2022-01-15 SURGERY — CORONARY/GRAFT ACUTE MI REVASCULARIZATION
Anesthesia: LOCAL

## 2022-01-15 MED ORDER — MIDAZOLAM HCL 2 MG/2ML IJ SOLN
INTRAMUSCULAR | Status: DC | PRN
  Administered 2022-01-15: 1 mg via INTRAVENOUS

## 2022-01-15 MED ORDER — HEPARIN SODIUM (PORCINE) 1000 UNIT/ML IJ SOLN
INTRAMUSCULAR | Status: AC
  Filled 2022-01-15: qty 10

## 2022-01-15 MED ORDER — NITROGLYCERIN 0.4 MG SL SUBL: 0.4000 mg | SUBLINGUAL_TABLET | SUBLINGUAL | Status: DC | PRN

## 2022-01-15 MED ORDER — SODIUM CHLORIDE 0.9 % WEIGHT BASED INFUSION: 1.0000 mL/kg/h | INTRAVENOUS | Status: DC

## 2022-01-15 MED ORDER — ACETAMINOPHEN 500 MG PO TABS: 500.0000 mg | ORAL_TABLET | ORAL | Status: DC | PRN

## 2022-01-15 MED ORDER — HYDRALAZINE HCL 20 MG/ML IJ SOLN: 5.0000 mg | INTRAMUSCULAR | Status: DC | PRN

## 2022-01-15 MED ORDER — VERAPAMIL HCL 2.5 MG/ML IV SOLN
INTRAVENOUS | Status: DC | PRN
  Administered 2022-01-15: 10 mL via INTRA_ARTERIAL

## 2022-01-15 MED ORDER — IOHEXOL 350 MG/ML SOLN
INTRAVENOUS | Status: DC | PRN
  Administered 2022-01-15: 65 mL

## 2022-01-15 MED ORDER — ASPIRIN 81 MG PO CHEW
81.0000 mg | CHEWABLE_TABLET | Freq: Every day | ORAL | Status: DC
  Administered 2022-01-16 – 2022-01-17 (×2): 81 mg via ORAL
  Filled 2022-01-15 (×2): qty 1

## 2022-01-15 MED ORDER — CYCLOBENZAPRINE HCL 10 MG PO TABS: 10.0000 mg | ORAL_TABLET | Freq: Three times a day (TID) | ORAL | Status: DC | PRN

## 2022-01-15 MED ORDER — MIDAZOLAM HCL 2 MG/2ML IJ SOLN
INTRAMUSCULAR | Status: AC
  Filled 2022-01-15: qty 2

## 2022-01-15 MED ORDER — LIDOCAINE HCL (PF) 1 % IJ SOLN
INTRAMUSCULAR | Status: AC
  Filled 2022-01-15: qty 30

## 2022-01-15 MED ORDER — HEPARIN SODIUM (PORCINE) 5000 UNIT/ML IJ SOLN
60.0000 [IU]/kg | Freq: Once | INTRAMUSCULAR | Status: AC
  Administered 2022-01-15: 3300 [IU] via INTRAVENOUS
  Filled 2022-01-15: qty 1

## 2022-01-15 MED ORDER — BISOPROLOL FUMARATE 5 MG PO TABS
5.0000 mg | ORAL_TABLET | Freq: Every day | ORAL | Status: DC
  Administered 2022-01-16: 5 mg via ORAL
  Filled 2022-01-15: qty 1

## 2022-01-15 MED ORDER — EZETIMIBE-SIMVASTATIN 10-20 MG PO TABS
1.0000 | ORAL_TABLET | Freq: Every day | ORAL | Status: DC
  Filled 2022-01-15: qty 1

## 2022-01-15 MED ORDER — TICAGRELOR 90 MG PO TABS
90.0000 mg | ORAL_TABLET | Freq: Two times a day (BID) | ORAL | Status: DC
  Administered 2022-01-16 – 2022-01-17 (×3): 90 mg via ORAL
  Filled 2022-01-15 (×3): qty 1

## 2022-01-15 MED ORDER — ACETAMINOPHEN 325 MG PO TABS
650.0000 mg | ORAL_TABLET | ORAL | Status: DC | PRN
  Administered 2022-01-17: 650 mg via ORAL
  Filled 2022-01-15: qty 2

## 2022-01-15 MED ORDER — SODIUM CHLORIDE 0.9 % IV SOLN
INTRAVENOUS | Status: DC
  Administered 2022-01-15: 20 mL/h via INTRAVENOUS

## 2022-01-15 MED ORDER — FENTANYL CITRATE (PF) 100 MCG/2ML IJ SOLN
INTRAMUSCULAR | Status: AC
  Filled 2022-01-15: qty 2

## 2022-01-15 MED ORDER — INSULIN ASPART 100 UNIT/ML IJ SOLN: 0.0000 [IU] | Freq: Three times a day (TID) | INTRAMUSCULAR | Status: DC

## 2022-01-15 MED ORDER — SODIUM CHLORIDE 0.9 % IV SOLN: 250.0000 mL | INTRAVENOUS | Status: DC | PRN

## 2022-01-15 MED ORDER — DORZOLAMIDE HCL-TIMOLOL MAL 2-0.5 % OP SOLN
1.0000 [drp] | Freq: Two times a day (BID) | OPHTHALMIC | Status: DC
  Administered 2022-01-16 – 2022-01-17 (×3): 1 [drp] via OPHTHALMIC
  Filled 2022-01-15: qty 10

## 2022-01-15 MED ORDER — LEVOTHYROXINE SODIUM 50 MCG PO TABS
50.0000 ug | ORAL_TABLET | Freq: Every day | ORAL | Status: DC
  Administered 2022-01-16 – 2022-01-17 (×2): 50 ug via ORAL
  Filled 2022-01-15 (×2): qty 1

## 2022-01-15 MED ORDER — LIDOCAINE HCL (PF) 1 % IJ SOLN
INTRAMUSCULAR | Status: DC | PRN
  Administered 2022-01-15: 2 mL

## 2022-01-15 MED ORDER — SIMVASTATIN 20 MG PO TABS
20.0000 mg | ORAL_TABLET | Freq: Every day | ORAL | Status: DC
  Filled 2022-01-15: qty 1

## 2022-01-15 MED ORDER — FAMOTIDINE 20 MG PO TABS
20.0000 mg | ORAL_TABLET | Freq: Every day | ORAL | Status: DC
  Administered 2022-01-16: 20 mg via ORAL
  Filled 2022-01-15: qty 1

## 2022-01-15 MED ORDER — ASPIRIN 81 MG PO CHEW
324.0000 mg | CHEWABLE_TABLET | Freq: Once | ORAL | Status: AC
  Administered 2022-01-15: 324 mg via ORAL
  Filled 2022-01-15: qty 4

## 2022-01-15 MED ORDER — HEPARIN SODIUM (PORCINE) 1000 UNIT/ML IJ SOLN
INTRAMUSCULAR | Status: DC | PRN
  Administered 2022-01-15: 5000 [IU] via INTRAVENOUS

## 2022-01-15 MED ORDER — AMLODIPINE BESYLATE 10 MG PO TABS
10.0000 mg | ORAL_TABLET | Freq: Every day | ORAL | Status: DC
  Administered 2022-01-16 – 2022-01-17 (×2): 10 mg via ORAL
  Filled 2022-01-15 (×2): qty 1

## 2022-01-15 MED ORDER — TICAGRELOR 90 MG PO TABS
180.0000 mg | ORAL_TABLET | Freq: Once | ORAL | Status: AC
Start: 2022-01-15 — End: 2022-01-15
  Administered 2022-01-15: 180 mg via ORAL
  Filled 2022-01-15: qty 2

## 2022-01-15 MED ORDER — HEPARIN (PORCINE) IN NACL 1000-0.9 UT/500ML-% IV SOLN
INTRAVENOUS | Status: DC | PRN
  Administered 2022-01-15 (×2): 500 mL

## 2022-01-15 MED ORDER — NITROGLYCERIN 1 MG/10 ML FOR IR/CATH LAB
INTRA_ARTERIAL | Status: DC | PRN
  Administered 2022-01-15: 200 ug via INTRACORONARY

## 2022-01-15 MED ORDER — HEPARIN (PORCINE) IN NACL 1000-0.9 UT/500ML-% IV SOLN
INTRAVENOUS | Status: AC
  Filled 2022-01-15: qty 500

## 2022-01-15 MED ORDER — SODIUM CHLORIDE 0.9% FLUSH: 3.0000 mL | INTRAVENOUS | Status: DC | PRN

## 2022-01-15 MED ORDER — SODIUM CHLORIDE 0.9% FLUSH
3.0000 mL | Freq: Two times a day (BID) | INTRAVENOUS | Status: DC
  Administered 2022-01-16 – 2022-01-17 (×3): 3 mL via INTRAVENOUS

## 2022-01-15 MED ORDER — FENTANYL CITRATE (PF) 100 MCG/2ML IJ SOLN
INTRAMUSCULAR | Status: DC | PRN
  Administered 2022-01-15: 12.5 ug via INTRAVENOUS

## 2022-01-15 MED ORDER — ONDANSETRON HCL 4 MG/2ML IJ SOLN
4.0000 mg | Freq: Four times a day (QID) | INTRAMUSCULAR | Status: DC | PRN
  Administered 2022-01-16: 4 mg via INTRAVENOUS
  Filled 2022-01-15: qty 2

## 2022-01-15 SURGICAL SUPPLY — 19 items
BALLN SAPPHIRE 2.5X20 (BALLOONS) ×1
BALLOON SAPPHIRE 2.5X20 (BALLOONS) IMPLANT
BAND CMPR LRG ZPHR (HEMOSTASIS) ×1
BAND ZEPHYR COMPRESS 30 LONG (HEMOSTASIS) IMPLANT
CATH LAUNCHER 6FR JR4 (CATHETERS) IMPLANT
CATH OPTITORQUE TIG 4.0 5F (CATHETERS) IMPLANT
GLIDESHEATH SLEND A-KIT 6F 22G (SHEATH) IMPLANT
GLIDESHEATH SLEND SS 6F .021 (SHEATH) IMPLANT
GUIDEWIRE INQWIRE 1.5J.035X260 (WIRE) IMPLANT
INQWIRE 1.5J .035X260CM (WIRE) ×1
KIT ENCORE 26 ADVANTAGE (KITS) IMPLANT
KIT HEART LEFT (KITS) ×1 IMPLANT
PACK CARDIAC CATHETERIZATION (CUSTOM PROCEDURE TRAY) ×1 IMPLANT
STENT SYNERGY XD 3.50X16 (Permanent Stent) IMPLANT
SYNERGY XD 3.50X16 (Permanent Stent) ×1 IMPLANT
TRANSDUCER W/STOPCOCK (MISCELLANEOUS) ×1 IMPLANT
TUBING CIL FLEX 10 FLL-RA (TUBING) ×1 IMPLANT
VALVE GUARDIAN II ~~LOC~~ HEMO (MISCELLANEOUS) IMPLANT
WIRE COUGAR XT STRL 190CM (WIRE) IMPLANT

## 2022-01-15 NOTE — ED Notes (Signed)
Daughter at bedside.

## 2022-01-15 NOTE — H&P (Signed)
CARDIOLOGY ADMIT NOTE   Patient ID: Mikayla [name] MRN: [id number] DOB/AGE: [birth year] 88 y.o.  Admit date: 01/15/2022 Primary Physician:  Lauree Chandler, NP  Patient ID: Mikayla [name], female    DOB: [birth year], 88 y.o.   MRN: [id number]  Chief Complaint  Patient presents with   Back Pain   HPI:    ANALEIGH [name]  is a 88 y.o. Female patient who lives independently, has history of hypertension, hypercholesterolemia, diabetes mellitus, stage IIIa chronic kidney disease, presented with upper back pain ongoing for about a week and brought to the emergency room by her daughter.  Chest also hurting with radiation to the front and she has been having shortness of breath and also mild nausea and diaphoresis.  Pain described as 8-9 out of 10 in intensity.  While waiting in the emergency room.  She also complained of feeling dizzy and felt like she is in a pass out.  Patient had an urgent EKG which revealed inferior STEMI.  Patient was then emergently evaluated and arrangements being made for transfer to [facility] for further evaluation.  Upon arrival to the Cath Lab, still having back pain but states that since she got some morphine in the emergency room chest pain/back pain is much improved.  She is aware she is going to need heart catheterization.  Past Medical History:  Diagnosis Date   Chronic rhinitis    CKD (chronic kidney disease), stage III (HCC)    Disorder of bone and cartilage, unspecified    Essential hypertension, benign    Glaucoma    Per [facility] new patient packet   Hearing loss    Per [place] new patient packet   High blood pressure    Per [facility] new patient packet   High cholesterol    Per [place] new patient packet   Obesity, unspecified    Other and unspecified hyperlipidemia    Type II or unspecified type diabetes mellitus without mention of complication, not stated as uncontrolled    Vision loss    Due to catarct Per [place] new patient packet   Past  Surgical History:  Procedure Laterality Date   CESAREAN SECTION N/A    ESOPHAGOGASTRODUODENOSCOPY N/A 04/29/2014   Procedure: ESOPHAGOGASTRODUODENOSCOPY (EGD);  Surgeon: Milus Banister, MD;  Location: [facility];  Service: Endoscopy;  Laterality: N/A;   TOTAL ABDOMINAL HYSTERECTOMY     Social History   Tobacco Use   Smoking status: Never   Smokeless tobacco: Never  Substance Use Topics   Alcohol use: Yes    Alcohol/week: 7.0 standard drinks of alcohol    Types: 7 Standard drinks or equivalent per week    Comment: wine with dinner   Marital Status: Married   Family History  Problem Relation Age of Onset   COPD Mother    Lung cancer Father    Prostate cancer Other    Multiple myeloma Son    Colon cancer Neg Hx    Esophageal cancer Neg Hx    Rectal cancer Neg Hx    Stomach cancer Neg Hx     ROS  Review of Systems  Cardiovascular:  Negative for chest pain, dyspnea on exertion and leg swelling.  Musculoskeletal:  Positive for back pain.   Objective      01/15/2022    8:30 PM 01/15/2022    8:20 PM 01/15/2022    8:15 PM  Vitals with BMI  Systolic 503 888 280  Diastolic 61 60 65  Pulse  68 70 73    Physical Exam Neck:     Vascular: No carotid bruit or JVD.  Cardiovascular:     Rate and Rhythm: Normal rate and regular rhythm.     Pulses: Intact distal pulses.          Carotid pulses are  on the left side with bruit.      Popliteal pulses are 2+ on the right side and 1+ on the left side.       Dorsalis pedis pulses are 2+ on the right side and 1+ on the left side.     Heart sounds: Normal heart sounds. No murmur heard.    No gallop.  Pulmonary:     Effort: Pulmonary effort is normal.     Breath sounds: Normal breath sounds.  Abdominal:     General: Bowel sounds are normal.     Palpations: Abdomen is soft.  Musculoskeletal:     Right lower leg: No edema.     Left lower leg: No edema.    Laboratory examination:   Recent Labs    06/21/21 1035 01/13/22 1133   NA 136 132*  K 4.2 4.6  CL 101 95*  CO2 28 25  GLUCOSE 137* 138*  BUN 43* 43*  CREATININE 1.42* 1.54*  CALCIUM 9.4 9.8   estimated creatinine clearance is 16.4 mL/min (A) (by C-G formula based on SCr of 1.54 mg/dL (H)).     Latest Ref Rng & Units 01/13/2022   11:33 AM 06/21/2021   10:35 AM 12/16/2020   11:10 AM  CMP  Glucose 65 - 99 mg/dL 138  137  142   BUN 7 - 25 mg/dL 43  43  33   Creatinine 0.60 - 0.95 mg/dL 1.54  1.42  1.35   Sodium 135 - 146 mmol/L 132  136  137   Potassium 3.5 - 5.3 mmol/L 4.6  4.2  4.7   Chloride 98 - 110 mmol/L 95  101  102   CO2 20 - 32 mmol/L _0 Calcium 8.6 - 10.4 mg/dL 9.8  9.4  9.4   Total Protein 6.1 - 8.1 g/dL 7.4  7.1    Total Bilirubin 0.2 - 1.2 mg/dL 0.4  0.5    Alkaline Phos 39 - 117 U/L  34    AST 10 - 35 U/L 12  12    ALT 6 - 29 U/L 7  8        Latest Ref Rng & Units 01/13/2022   11:33 AM 06/21/2021   10:35 AM 06/15/2020   10:53 AM  CBC  WBC 3.8 - 10.8 Thousand/uL 8.5  8.0  8.3   Hemoglobin 11.7 - 15.5 g/dL 12.6  11.2  11.3   Hematocrit 35.0 - 45.0 % 37.5  33.6  33.1   Platelets 140 - 400 Thousand/uL 239  211.0  238.0    Lipid Panel     Component Value Date/Time   CHOL 291 (H) 06/21/2021 1035   TRIG 274.0 (H) 06/21/2021 1035   TRIG 160 (H) 04/23/2006 1043   HDL 44.00 06/21/2021 1035   CHOLHDL 7 06/21/2021 1035   VLDL 54.8 (H) 06/21/2021 1035   LDLCALC 125 (H) 03/13/2017 1012   LDLDIRECT 156.0 06/21/2021 1035   HEMOGLOBIN A1C Lab Results  Component Value Date   HGBA1C 6.4 (H) 01/13/2022   MPG 137 01/13/2022   TSH Recent Labs    06/21/21 1035  TSH 1.93   BNP (last  3 results) No results for input(s): "BNP" in the last 8760 hours. Cardiac Panel (last 3 results) No results for input(s): "CKTOTAL", "CKMB", "TROPONINIHS", "RELINDX" in the last 72 hours.   Medications and allergies   Allergies  Allergen Reactions   Amoxicillin Rash   Lipitor [Atorvastatin]     REACTION: aches   Statins     Myalgias       sodium chloride 20 mL/hr (01/15/22 2007)   Current Outpatient Medications  Medication Instructions   acetaminophen (TYLENOL) 500 mg, Oral, As needed   albuterol (VENTOLIN HFA) 108 (90 Base) MCG/ACT inhaler 2 puffs, Inhalation, Every 6 hours PRN   amLODipine (NORVASC) 10 MG tablet TAKE 1 TABLET BY MOUTH DAILY   aspirin 81 mg, Oral, Daily,     bisoprolol (ZEBETA) 5 mg, Oral, Daily   budesonide-formoterol (SYMBICORT) 80-4.5 MCG/ACT inhaler 2 puffs, Inhalation, 2 times daily PRN   cyclobenzaprine (FLEXERIL) 10 mg, Oral, 3 times daily PRN   dextromethorphan (DELSYM) 30 mg, Oral, As needed   dorzolamide-timolol (COSOPT) 22.3-6.8 MG/ML ophthalmic solution 1 drop, Both Eyes, 2 times daily   famotidine (PEPCID) 20 mg, Oral, At bedtime PRN   furosemide (LASIX) 20 MG tablet TAKE 1 TABLET BY MOUTH DAILY.   levothyroxine (SYNTHROID) 50 MCG tablet TAKE 1 TABLET BY MOUTH DAILY BEFORE BREAKFAST.   loratadine (CLARITIN) 10 mg, Oral, As needed   meclizine (ANTIVERT) 12.5 mg, Oral, As needed   metFORMIN (GLUCOPHAGE) 500 mg, Oral, Daily with breakfast   methocarbamol (ROBAXIN) 250 mg, Oral, Every 8 hours PRN   Multiple Vitamins-Minerals (CENTRUM SILVER PO) 1 tablet, Oral, Daily,     predniSONE (STERAPRED UNI-PAK 21 TAB) 10 MG (21) TBPK tablet Use as directed   Vitamin D 1,000 Units, Oral, Daily,     Radiology:  DG Chest Port 1 View  Result Date: 01/15/2022 CLINICAL DATA:  Chest pain. EXAM: PORTABLE CHEST 1 VIEW COMPARISON:  Chest radiograph dated 05/25/2019. FINDINGS: Background of emphysema. No focal consolidation, pleural effusion, or pneumothorax. The cardiac silhouette is within limits. Atherosclerotic calcification of the aorta. No acute osseous pathology. Osteopenia. IMPRESSION: 1. No acute cardiopulmonary process. 2. Emphysema. Electronically Signed   By: Anner Crete M.D.   On: 01/15/2022 20:27    Cardiac Studies:   Carotid artery duplex 06-26-19: Right Carotid: Velocities in the right ICA are  consistent with a 1-39% stenosis. Non-hemodynamically significant plaque <50% noted in the CCA. Left Carotid: Velocities in the left ICA are consistent with a 60-79% stenosis. The ECA appears >50% stenosed. Unable to determine a more significant stenosis in the proximal ICA due to acoustic shadowing from plaque formation. Vertebrals:  Bilateral vertebral arteries demonstrate antegrade flow. Subclavians: Normal flow hemodynamics were seen in bilateral subclavian  arteries.  Echocardiogram 05/25/2019:  1. Left ventricular ejection fraction, by visual estimation, is 60 to 65%. The left ventricle has normal function. There is severely increased left ventricular hypertrophy.  Severe asymmetric hypertrophy measuring 53m in basal septum (160min posterior wall). Left ventricular diastolic parameters are indeterminate.  2. Global right ventricle has normal systolic function.The right ventricular size is normal.  3. Moderate mitral annular calcification. The mitral valve is abnormal. No evidence of mitral valve regurgitation.  4. The aortic valve is tricuspid. Aortic valve regurgitation is not visualized. Mild to moderate aortic valve sclerosis/calcification without any evidence of aortic stenosis.  EKG 01/15/2026: Sinus rhythm with first-degree AV block at rate of 70 bpm, acute inferior STEMI with ST elevation in 2 3 aVF and reciprocal ST  depression in 1 and aVL.  Compared to 05/25/2019, ST elevation is new, first-degree AV block was evident.  Assessment   1.  Acute inferior STEMI 2.  Diabetes mellitus type 2 with chronic stage IIIa kidney disease 3.  Primary hypertension 4.  Mixed hypercholesterolemia  Recommendations:   Patient with acute inferior STEMI, with ongoing back pain.  Patient's symptoms started about 3 to 4 days ago but worse today and hence presented to the emergency room.  No Q waves evident.  With acute inferior STEMI, although advanced age, patient relatively functional, will proceed with  emergent cardiac catheterization.  Patient will be directly taken to the cardiac catheterization lab.  We will aggressively treat her diabetes, avoid excess contrast use in view of chronic kidney disease, manage her blood pressure appropriately as well.  60 minutes of critical care time spent with coordination of care, evaluation of EKG, labs and patient making.    Adrian Prows, MD, [facility] 01/15/2022, 8:46 PM Office: [phone] Fax: [phone] Pager: [phone]

## 2022-01-15 NOTE — ED Triage Notes (Signed)
BIB [facility], pt sts that she's had back pain x 5 days, sts upper to mid back, thinks its from cleaning and overuse, took tylenol @ 1630 and sts this has improved pain, pt using wheelchair during triage, pt alert and oriented x 4, HOH, from home lives independently.

## 2022-01-15 NOTE — ED Notes (Signed)
Pt in lobby with daughter who told front desk that pt began feeling dizzy and "like I'm going to pass out". She sts that since pain started this feeling is new. RN pulled pt into triage room and tech captured ekg. Ekg shown to MD and pt taken to room with daughter bedside.

## 2022-01-15 NOTE — ED Notes (Signed)
EMS at bedside Pt stable at time of departure

## 2022-01-15 NOTE — ED Provider Notes (Signed)
[facility] EMERGENCY DEPT Provider Note   CSN: [id number] Arrival date & time: 01/15/22  1912     History {Add pertinent medical, surgical, social history, OB history to HPI:1} Chief Complaint  Patient presents with   Back Pain    Mikayla [name] is a 88 y.o. female.  She is brought in by her daughter for evaluation of upper back pain its been going on about a week.  She attributes it to housecleaning.  She does say it sometimes radiates around into her chest and is associated with shortness of breath diaphoresis nausea.  She has tried nothing for it.  She denies any prior history of same.  She rates her pain as 8 out of 10.  The history is provided by the patient.  Back Pain Location:  Thoracic spine Quality:  Aching Radiates to: chest. Pain severity:  Severe Pain is:  Same all the time Onset quality:  Gradual Duration:  1 week Timing:  Constant Progression:  Waxing and waning Chronicity:  New Context: lifting heavy objects   Relieved by:  Nothing Worsened by:  Nothing Ineffective treatments:  None tried Associated symptoms: chest pain   Associated symptoms: no abdominal pain, no dysuria, no fever, no headaches, no numbness and no weakness        Home Medications Prior to Admission medications   Medication Sig Start Date End Date Taking? Authorizing Provider  acetaminophen (TYLENOL) 500 MG tablet Take 500 mg by mouth as needed.    [provider]  albuterol (VENTOLIN HFA) 108 (90 Base) MCG/ACT inhaler Inhale 2 puffs into the lungs every 6 (six) hours as needed for wheezing or shortness of breath. 10/21/21   Lauree Chandler, NP  amLODipine (NORVASC) 10 MG tablet TAKE 1 TABLET BY MOUTH DAILY 08/10/21   Tanda Rockers, MD  aspirin 81 MG tablet Take 81 mg by mouth daily.    [provider]  bisoprolol (ZEBETA) 5 MG tablet Take 5 mg by mouth daily.    [provider]  budesonide-formoterol (SYMBICORT) 80-4.5 MCG/ACT inhaler  Inhale 2 puffs into the lungs 2 (two) times daily as needed (shortness of breath/wheezing). 12/16/20   Parrett, Fonnie Mu, NP  Cholecalciferol (VITAMIN D) 1000 UNITS capsule Take 1,000 Units by mouth daily.    [provider]  cyclobenzaprine (FLEXERIL) 10 MG tablet Take 10 mg by mouth 3 (three) times daily as needed for muscle spasms.    [provider]  dextromethorphan (DELSYM) 30 MG/5ML liquid Take 30 mg by mouth as needed for cough.    [provider]  dorzolamide-timolol (COSOPT) 22.3-6.8 MG/ML ophthalmic solution Place 1 drop into both eyes 2 (two) times daily.  04/18/19   [provider]  famotidine (PEPCID) 20 MG tablet Take 20 mg by mouth at bedtime as needed for heartburn (COUGH FLARE).     [provider]  furosemide (LASIX) 20 MG tablet TAKE 1 TABLET BY MOUTH DAILY. 03/09/21   Tanda Rockers, MD  levothyroxine (SYNTHROID) 50 MCG tablet TAKE 1 TABLET BY MOUTH DAILY BEFORE BREAKFAST. 04/04/21   Tanda Rockers, MD  loratadine (CLARITIN) 10 MG tablet Take 10 mg by mouth as needed for allergies.    [provider]  meclizine (ANTIVERT) 12.5 MG tablet Take 12.5 mg by mouth as needed for dizziness.    [provider]  metFORMIN (GLUCOPHAGE) 500 MG tablet Take 1 tablet (500 mg total) by mouth daily with breakfast. 09/09/21   Lauree Chandler, NP  methocarbamol (ROBAXIN) 500 MG tablet Take 0.5 tablets (250 mg total) by mouth every 8 (eight) hours as needed for muscle spasms. 11/02/21   Ngetich, Dinah C, NP  Multiple Vitamins-Minerals (CENTRUM SILVER PO) Take 1 tablet by mouth daily.    [provider]  predniSONE (STERAPRED UNI-PAK 21 TAB) 10 MG (21) TBPK tablet Use as directed 01/13/22   Lauree Chandler, NP      Allergies    Amoxicillin, Lipitor [atorvastatin], and Statins    Review of Systems   Review of Systems  Constitutional:  Positive for diaphoresis. Negative for fever.  HENT:  Negative for sore throat.    Respiratory:  Positive for shortness of breath.   Cardiovascular:  Positive for chest pain.  Gastrointestinal:  Negative for abdominal pain.  Genitourinary:  Negative for dysuria.  Musculoskeletal:  Positive for back pain.  Skin:  Negative for rash.  Neurological:  Negative for weakness, numbness and headaches.    Physical Exam Updated Vital Signs BP (!) 183/60 (BP Location: Left Arm)   Pulse 74   Temp 97.7 F (36.5 C)   Resp 17   Ht '4\' 10"'$  (1.473 m)   Wt 55 kg   SpO2 100%   BMI 25.34 kg/m  Physical Exam Vitals and nursing note reviewed.  Constitutional:      General: She is not in acute distress.    Appearance: Normal appearance. She is well-developed.  HENT:     Head: Normocephalic and atraumatic.  Eyes:     Conjunctiva/sclera: Conjunctivae normal.  Cardiovascular:     Rate and Rhythm: Normal rate and regular rhythm.     Pulses: Normal pulses.     Heart sounds: No murmur heard. Pulmonary:     Effort: Pulmonary effort is normal. No respiratory distress.     Breath sounds: Normal breath sounds.  Abdominal:     Palpations: Abdomen is soft.     Tenderness: There is no abdominal tenderness. There is no guarding or rebound.  Musculoskeletal:        General: Normal range of motion.     Cervical back: Neck supple.     Right lower leg: No edema.     Left lower leg: No edema.  Skin:    General: Skin is warm and dry.     Capillary Refill: Capillary refill takes less than 2 seconds.  Neurological:     General: No focal deficit present.     Mental Status: She is alert.     Sensory: No sensory deficit.     Motor: No weakness.     ED Results / Procedures / Treatments   Labs (all labs ordered are listed, but only abnormal results are displayed) Labs Reviewed  HEMOGLOBIN A1C  CBC WITH DIFFERENTIAL/PLATELET  PROTIME-INR  APTT  COMPREHENSIVE METABOLIC PANEL  LIPID PANEL  TROPONIN I (HIGH SENSITIVITY)    EKG EKG Interpretation  Date/Time:  Sunday January 15 2022 19:48:15 EDT Ventricular Rate:  70 PR Interval:  304 QRS Duration: 88 QT Interval:  360 QTC Calculation: 388 R Axis:   46 Text Interpretation: Sinus rhythm with 1st degree A-V block Possible Anterior infarct (cited on or before 25-May-2019) Inferolateral injury pattern ** ** ACUTE MI / STEMI ** ** Consider right ventricular involvement in acute inferior infarct Abnormal ECG When compared with ECG of 25-May-2019 01:27, ST elevation now present in Inferior leads Confirmed by Aletta Edouard [phone]) on 01/15/2022 7:57:07 PM  Radiology No results found.  Procedures Procedures  {Document cardiac  monitor, telemetry assessment procedure when appropriate:1}  Medications Ordered in ED Medications  0.9 %  sodium chloride infusion (20 mL/hr Intravenous New Bag/Given 01/15/22 2007)  aspirin chewable tablet 324 mg (324 mg Oral Given 01/15/22 2007)  heparin injection 3,300 Units (3,300 Units Intravenous Given 01/15/22 2007)  ticagrelor (BRILINTA) tablet 180 mg (180 mg Oral Given 01/15/22 2010)    ED Course/ Medical Decision Making/ A&P                           Medical Decision Making Amount and/or Complexity of Data Reviewed Labs: ordered. Radiology: ordered.  Risk OTC drugs. Prescription drug management.   This patient complains of ***; this involves an extensive number of treatment Options and is a complaint that carries with it a high risk of complications and morbidity. The differential includes ***  I ordered, reviewed and interpreted labs, which included *** I ordered medication *** and reviewed PMP when indicated. I ordered imaging studies which included *** and I independently    visualized and interpreted imaging which showed *** Additional history obtained from *** Previous records obtained and reviewed *** I consulted *** and discussed lab and imaging findings and discussed disposition.  Cardiac monitoring reviewed, *** Social determinants considered, *** Critical  Interventions: ***  After the interventions stated above, I reevaluated the patient and found *** Admission and further testing considered, ***   {Document critical care time when appropriate:1} {Document review of labs and clinical decision tools ie heart score, Chads2Vasc2 etc:1}  {Document your independent review of radiology images, and any outside records:1} {Document your discussion with family members, caretakers, and with consultants:1} {Document social determinants of health affecting pt's care:1} {Document your decision making why or why not admission, treatments were needed:1} Final Clinical Impression(s) / ED Diagnoses Final diagnoses:  None    Rx / DC Orders ED Discharge Orders     None

## 2022-01-15 NOTE — ED Notes (Signed)
ED Provider at bedside. 

## 2022-01-15 NOTE — ED Notes (Signed)
Called [facility] to activate Code STEMI, no truck available so called EMS for transportation.

## 2022-01-15 NOTE — ED Notes (Signed)
X-ray at bedside

## 2022-01-15 NOTE — ED Notes (Addendum)
Paged On Call STEMI at 37pm (Dr. Einar Gip)

## 2022-01-15 NOTE — ED Provider Notes (Signed)
  Physical Exam  BP (!) 183/61   Pulse 68   Temp 97.7 F (36.5 C)   Resp 15   Ht '4\' 10"'$  (1.473 m)   Wt 55 kg   SpO2 100%   BMI 25.34 kg/m     Procedures  Procedures  ED Course / MDM   Clinical Course as of 01/15/22 2057  [facility] Jan 15, 2022  2018 Discussed with Dr. Einar Gip STEMI attending on-call.  He reviewed her EKG and wants to activate her as a STEMI.  He agrees with current management of aspirin and heparin and he also asked for her to get Brilinta. [MB]  2019 Chest x-ray interpreted by me as normal mediastinum no pneumothorax. [MB]  2019 Discussed with Dr. Sabra Heck ED attending at [facility] who is excepting the patient in transfer. [MB]  2038 Transport team is here to take patient over to [facility].  Dr. Einar Gip updated. [MB]    Clinical Course User Index [MB] Hayden Rasmussen, MD   Medical Decision Making Amount and/or Complexity of Data Reviewed Labs: ordered. Radiology: ordered.  Risk OTC drugs. Prescription drug management.   Patient is a 88 year old female who arrived from med center [facility] due to concern for STEMI.  Had presented with back pain for the past 5 days in the mid back.  EKG revealed STEMI, code STEMI activated.  The patient was administered aspirin, Brilinta and heparin and transported to the [facility] emergency department for cardiac catheterization.  The patient's airway was cleared and she was promptly transported to the cardiac catheterization lab.  Dr. Einar Gip of on-call cardiology was notified and awaiting the patient in the Cath Lab.       Regan Lemming, MD 01/15/22 2102

## 2022-01-16 ENCOUNTER — Inpatient Hospital Stay (HOSPITAL_COMMUNITY): Payer: Medicare Other

## 2022-01-16 LAB — CBC
HCT: 31.9 % — ABNORMAL LOW (ref 36.0–46.0)
HCT: 32.8 % — ABNORMAL LOW (ref 36.0–46.0)
Hemoglobin: 11.2 g/dL — ABNORMAL LOW (ref 12.0–15.0)
Hemoglobin: 11.4 g/dL — ABNORMAL LOW (ref 12.0–15.0)
MCH: 29.4 pg (ref 26.0–34.0)
MCH: 29.6 pg (ref 26.0–34.0)
MCHC: 34.8 g/dL (ref 30.0–36.0)
MCHC: 35.1 g/dL (ref 30.0–36.0)
MCV: 84.2 fL (ref 80.0–100.0)
MCV: 84.5 fL (ref 80.0–100.0)
Platelets: 233 10*3/uL (ref 150–400)
Platelets: 251 10*3/uL (ref 150–400)
RBC: 3.79 MIL/uL — ABNORMAL LOW (ref 3.87–5.11)
RBC: 3.88 MIL/uL (ref 3.87–5.11)
RDW: 13 % (ref 11.5–15.5)
RDW: 13 % (ref 11.5–15.5)
WBC: 12.7 10*3/uL — ABNORMAL HIGH (ref 4.0–10.5)
WBC: 14.7 10*3/uL — ABNORMAL HIGH (ref 4.0–10.5)
nRBC: 0 % (ref 0.0–0.2)
nRBC: 0 % (ref 0.0–0.2)

## 2022-01-16 LAB — BASIC METABOLIC PANEL
Anion gap: 10 (ref 5–15)
Anion gap: 11 (ref 5–15)
BUN: 44 mg/dL — ABNORMAL HIGH (ref 8–23)
BUN: 51 mg/dL — ABNORMAL HIGH (ref 8–23)
CO2: 21 mmol/L — ABNORMAL LOW (ref 22–32)
CO2: 21 mmol/L — ABNORMAL LOW (ref 22–32)
Calcium: 9.1 mg/dL (ref 8.9–10.3)
Calcium: 9.2 mg/dL (ref 8.9–10.3)
Chloride: 97 mmol/L — ABNORMAL LOW (ref 98–111)
Chloride: 98 mmol/L (ref 98–111)
Creatinine, Ser: 1.3 mg/dL — ABNORMAL HIGH (ref 0.44–1.00)
Creatinine, Ser: 1.35 mg/dL — ABNORMAL HIGH (ref 0.44–1.00)
GFR, Estimated: 36 mL/min — ABNORMAL LOW (ref >60)
GFR, Estimated: 38 mL/min — ABNORMAL LOW (ref >60)
Glucose, Bld: 120 mg/dL — ABNORMAL HIGH (ref 70–99)
Glucose, Bld: 181 mg/dL — ABNORMAL HIGH (ref 70–99)
Potassium: 4 mmol/L (ref 3.5–5.1)
Potassium: 4.2 mmol/L (ref 3.5–5.1)
Sodium: 128 mmol/L — ABNORMAL LOW (ref 135–145)
Sodium: 130 mmol/L — ABNORMAL LOW (ref 135–145)

## 2022-01-16 LAB — ECHOCARDIOGRAM COMPLETE
AR max vel: 1.59 cm2
AV Area VTI: 1.61 cm2
AV Area mean vel: 1.54 cm2
AV Mean grad: 4 mmHg
AV Peak grad: 8.3 mmHg
Ao pk vel: 1.44 m/s
Area-P 1/2: 2.16 cm2
Height: 58 in
MV VTI: 1.45 cm2
S' Lateral: 2.1 cm
Weight: 2003.54 oz

## 2022-01-16 LAB — TROPONIN I (HIGH SENSITIVITY): Troponin I (High Sensitivity): >24000 ng/L (ref <18)

## 2022-01-16 LAB — GLUCOSE, CAPILLARY
Glucose-Capillary: 110 mg/dL — ABNORMAL HIGH (ref 70–99)
Glucose-Capillary: 135 mg/dL — ABNORMAL HIGH (ref 70–99)
Glucose-Capillary: 133 mg/dL — ABNORMAL HIGH (ref 70–99)
Glucose-Capillary: 148 mg/dL — ABNORMAL HIGH (ref 70–99)

## 2022-01-16 LAB — BRAIN NATRIURETIC PEPTIDE: B Natriuretic Peptide: 639.2 pg/mL — ABNORMAL HIGH (ref 0.0–100.0)

## 2022-01-16 LAB — MAGNESIUM: Magnesium: 2.2 mg/dL (ref 1.7–2.4)

## 2022-01-16 MED ORDER — EZETIMIBE 10 MG PO TABS
10.0000 mg | ORAL_TABLET | Freq: Every day | ORAL | Status: DC
  Administered 2022-01-16 – 2022-01-17 (×2): 10 mg via ORAL
  Filled 2022-01-16 (×2): qty 1

## 2022-01-16 MED ORDER — METFORMIN HCL 500 MG PO TABS
500.0000 mg | ORAL_TABLET | Freq: Every day | ORAL | Status: DC
  Administered 2022-01-17: 500 mg via ORAL
  Filled 2022-01-16: qty 1

## 2022-01-16 MED ORDER — MAGNESIUM HYDROXIDE 400 MG/5ML PO SUSP
30.0000 mL | Freq: Every day | ORAL | Status: DC | PRN
  Administered 2022-01-16: 30 mL via ORAL
  Filled 2022-01-16 (×2): qty 30

## 2022-01-16 MED ORDER — CHLORHEXIDINE GLUCONATE CLOTH 2 % EX PADS
6.0000 | MEDICATED_PAD | Freq: Every day | CUTANEOUS | Status: DC
  Administered 2022-01-16: 6 via TOPICAL

## 2022-01-16 MED ORDER — BISOPROLOL FUMARATE 5 MG PO TABS
10.0000 mg | ORAL_TABLET | Freq: Every day | ORAL | Status: DC
  Administered 2022-01-17: 10 mg via ORAL
  Filled 2022-01-16: qty 2

## 2022-01-16 NOTE — Progress Notes (Signed)
Patient ambulated in hallway with nursing staff 150 feet. Pt with standing rest breaks x2. Gait slightly unsteady. To restroom and back to bed call bell within reach. Latonja Bobeck, Bettina Gavia RN

## 2022-01-16 NOTE — Progress Notes (Signed)
  Echocardiogram 2D Echocardiogram has been performed.  Mikayla [name] 01/16/2022, 8:51 AM

## 2022-01-16 NOTE — Progress Notes (Signed)
Patient arrived from [facility], patient placed on monitor and vital signs obtained. CCMD made aware. Family at bedside call bell within reach. Silver Achey, Bettina Gavia RN

## 2022-01-16 NOTE — Progress Notes (Signed)
Subjective:  Presently doing well, has not had any chest pain.  Intake/Output from previous day:  I/O last 3 completed shifts: In: 290 [P.O.:240; Other:50] Out: 700 [Urine:700] No intake/output data recorded. Net IO Since Admission: -410 mL [01/16/22 0938]  Blood pressure (!) 124/50, pulse 66, temperature 97.9 F (36.6 C), resp. rate 17, height 4' 10"  (1.473 m), weight 56.8 kg, SpO2 98 %. Physical Exam Neck:     Vascular: No carotid bruit or JVD.  Cardiovascular:     Rate and Rhythm: Normal rate and regular rhythm.     Pulses: Intact distal pulses.          Carotid pulses are  on the left side with bruit.    Heart sounds: Normal heart sounds. No murmur heard.    No gallop.  Pulmonary:     Effort: Pulmonary effort is normal.     Breath sounds: Normal breath sounds.  Abdominal:     General: Bowel sounds are normal.     Palpations: Abdomen is soft.  Musculoskeletal:     Right lower leg: No edema.     Left lower leg: No edema.    Lab Results: BMP BNP (last 3 results) No results for input(s): "BNP" in the last 8760 hours.  ProBNP (last 3 results) No results for input(s): "PROBNP" in the last 8760 hours.    Latest Ref Rng & Units 01/16/2022    7:43 AM 01/15/2022   11:51 PM 01/13/2022   11:33 AM  BMP  Glucose 70 - 99 mg/dL 120  181  138   BUN 8 - 23 mg/dL 44  51  43   Creatinine 0.44 - 1.00 mg/dL 1.30  1.35  1.54   BUN/Creat Ratio 6 - 22 (calc)   28   Sodium 135 - 145 mmol/L 130  128  132   Potassium 3.5 - 5.1 mmol/L 4.2  4.0  4.6   Chloride 98 - 111 mmol/L 98  97  95   CO2 22 - 32 mmol/L 21  21  25    Calcium 8.9 - 10.3 mg/dL 9.2  9.1  9.8       Latest Ref Rng & Units 01/13/2022   11:33 AM 06/21/2021   10:35 AM 06/15/2020   10:53 AM  Hepatic Function  Total Protein 6.1 - 8.1 g/dL 7.4  7.1  6.8   Albumin 3.5 - 5.2 g/dL  4.1  4.0   AST 10 - 35 U/L 12  12  11    ALT 6 - 29 U/L 7  8  7    Alk Phosphatase 39 - 117 U/L  34  36   Total Bilirubin 0.2 - 1.2 mg/dL 0.4  0.5  0.4    Bilirubin, Direct 0.0 - 0.3 mg/dL  0.0  0.1       Latest Ref Rng & Units 01/16/2022    7:43 AM 01/15/2022   11:51 PM 01/13/2022   11:33 AM  CBC  WBC 4.0 - 10.5 K/uL 14.7  12.7  8.5   Hemoglobin 12.0 - 15.0 g/dL 11.2  11.4  12.6   Hematocrit 36.0 - 46.0 % 31.9  32.8  37.5   Platelets 150 - 400 K/uL 233  251  239    Lab Results  Component Value Date   CHOL 291 (H) 06/21/2021   HDL 44.00 06/21/2021   LDLCALC 125 (H) 03/13/2017   LDLDIRECT 156.0 06/21/2021   TRIG 274.0 (H) 06/21/2021   CHOLHDL 7 06/21/2021   Lipid pro  Cardiac Panel (last 3 results) Recent Labs    01/15/22 2212 01/15/22 2351  TROPONINIHS 18,120* >24,000*    HEMOGLOBIN A1C Lab Results  Component Value Date   HGBA1C 6.4 (H) 01/13/2022   MPG 137 01/13/2022   TSH Recent Labs    06/21/21 1035  TSH 1.93   Scheduled Meds:  amLODipine  10 mg Oral Daily   aspirin  81 mg Oral Daily   bisoprolol  5 mg Oral Daily   dorzolamide-timolol  1 drop Both Eyes BID   ezetimibe  10 mg Oral Daily   famotidine  20 mg Oral QHS   insulin aspart  0-9 Units Subcutaneous TID WC   levothyroxine  50 mcg Oral Q0600   simvastatin  20 mg Oral q1800   sodium chloride flush  3 mL Intravenous Q12H   ticagrelor  90 mg Oral BID   Continuous Infusions:  sodium chloride 20 mL/hr (01/15/22 2007)   sodium chloride     PRN Meds:.sodium chloride, acetaminophen, cyclobenzaprine, nitroGLYCERIN, ondansetron (ZOFRAN) IV, sodium chloride flush  Radiology:   Imaging Results (Last 48 hours)[] Expand by Default  DG Chest Port 1 View   Result Date: 01/15/2022 CLINICAL DATA:  Chest pain. EXAM: PORTABLE CHEST 1 VIEW COMPARISON:  Chest radiograph dated 05/25/2019. FINDINGS: Background of emphysema. No focal consolidation, pleural effusion, or pneumothorax. The cardiac silhouette is within limits. Atherosclerotic calcification of the aorta. No acute osseous pathology. Osteopenia. IMPRESSION: 1. No acute cardiopulmonary process. 2. Emphysema.  Electronically Signed   By: Anner Crete M.D.   On: 01/15/2022 20:27       Cardiac Studies:    Carotid artery duplex 06/18/2019: Right Carotid: Velocities in the right ICA are consistent with a 1-39% stenosis. Non-hemodynamically significant plaque <50% noted in the CCA. Left Carotid: Velocities in the left ICA are consistent with a 60-79% stenosis. The ECA appears >50% stenosed. Unable to determine a more significant stenosis in the proximal ICA due to acoustic shadowing from plaque formation. Vertebrals:  Bilateral vertebral arteries demonstrate antegrade flow. Subclavians: Normal flow hemodynamics were seen in bilateral subclavian  arteries.   Echocardiogram 05/25/2019:  1. Left ventricular ejection fraction, by visual estimation, is 60 to 65%. The left ventricle has normal function. There is severely increased left ventricular hypertrophy.  Severe asymmetric hypertrophy measuring 44m in basal septum (158min posterior wall). Left ventricular diastolic parameters are indeterminate.  2. Global right ventricle has normal systolic function.The right ventricular size is normal.  3. Moderate mitral annular calcification. The mitral valve is abnormal. No evidence of mitral valve regurgitation.  4. The aortic valve is tricuspid. Aortic valve regurgitation is not visualized. Mild to moderate aortic valve sclerosis/calcification without any evidence of aortic stenosis.  Echocardiogram 01/16/2022:  1. Left ventricular ejection fraction, by estimation, is 55 to 60%. The left ventricle has normal function. The left ventricle demonstrates regional wall motion abnormalities (see scoring diagram/findings for description). There is moderate left  ventricular hypertrophy. Left ventricular diastolic parameters are consistent with Grade II diastolic dysfunction (pseudonormalization). Elevated left ventricular end-diastolic pressure. The E/e' is 24.1. There is moderate hypokinesis of the left  ventricular,  basal-mid inferior wall.  2. Right ventricular systolic function is normal. The right ventricular size is normal. Mildly increased right ventricular wall thickness. Tricuspid regurgitation signal is inadequate for assessing PA pressure.  3. Left atrial size was mildly dilated.  4. The mitral valve is normal in structure. Moderate mitral valve regurgitation. No evidence of mitral stenosis.  5. The aortic valve is  tricuspid. Aortic valve regurgitation is not visualized. Aortic valve sclerosis/calcification is present, without any evidence of aortic stenosis.  6. There is mild (Grade II) plaque.    EKG:  EKG 01/16/2022: Sinus rhythm with first-degree AV block at rate of 65 bpm, inferior infarct old.  Possibly posterior infarct old.  Nonspecific T abnormality.  Compared to prior EKG 01/15/2022, inferior STEMI not  Present   Assessment    1.  Acute inferior STEMI 2.  Diabetes mellitus type 2 with chronic stage IIIa kidney disease 3.  Primary hypertension 4.  Mixed hypercholesterolemia 5.  Chronic hyponatremia   Recommendations:    Patient has done remarkably well.  She is ambulated in the room without any problems, right forearm hematoma is very small and not consequential.  Renal function has remained stable.  Do not know the exact etiology for hyponatremia.  No clinical evidence of heart failure.  I will check BNP.    Continue simvastatin along with addition of Zetia for hypercholesterolemia.  In review of her chart, sodium levels have been low, do not know the etiology.  No obvious heart failure.  Echocardiogram reveals preserved LVEF with very mild inferior hypokinesis.  Patient appears stable to be transferred to the floor, will have cardiac rehab evaluate her, possible discharge tomorrow.  Patient's daughter is present and all questions answered.  For her diabetes mellitus, she was on metformin, I will reinitiate therapy as I have only used 65 mL of contrast.    Adrian Prows, MD,  [facility] 01/16/2022, 9:38 AM Office: [phone] Fax: [phone] Pager: [phone]

## 2022-01-17 ENCOUNTER — Other Ambulatory Visit (HOSPITAL_COMMUNITY): Payer: Self-pay

## 2022-01-17 ENCOUNTER — Telehealth (HOSPITAL_COMMUNITY): Payer: Self-pay

## 2022-01-17 ENCOUNTER — Encounter (HOSPITAL_COMMUNITY): Payer: Self-pay | Admitting: Cardiology

## 2022-01-17 DIAGNOSIS — E871 Hypo-osmolality and hyponatremia: Secondary | ICD-10-CM

## 2022-01-17 DIAGNOSIS — I5033 Acute on chronic diastolic (congestive) heart failure: Secondary | ICD-10-CM

## 2022-01-17 LAB — GLUCOSE, CAPILLARY
Glucose-Capillary: 106 mg/dL — ABNORMAL HIGH (ref 70–99)
Glucose-Capillary: 112 mg/dL — ABNORMAL HIGH (ref 70–99)
Glucose-Capillary: 123 mg/dL — ABNORMAL HIGH (ref 70–99)

## 2022-01-17 MED ORDER — TICAGRELOR 90 MG PO TABS
90.0000 mg | ORAL_TABLET | Freq: Two times a day (BID) | ORAL | 2 refills | Status: DC
  Filled 2022-01-17: qty 60, 30d supply, fill #0

## 2022-01-17 MED ORDER — POTASSIUM CHLORIDE CRYS ER 10 MEQ PO TBCR
20.0000 meq | EXTENDED_RELEASE_TABLET | Freq: Every day | ORAL | Status: DC
  Administered 2022-01-17: 20 meq via ORAL
  Filled 2022-01-17 (×2): qty 2

## 2022-01-17 MED ORDER — EZETIMIBE 10 MG PO TABS
10.0000 mg | ORAL_TABLET | Freq: Every day | ORAL | 2 refills | Status: DC
  Filled 2022-01-17: qty 30, 30d supply, fill #0

## 2022-01-17 MED ORDER — NITROGLYCERIN 0.4 MG SL SUBL
0.4000 mg | SUBLINGUAL_TABLET | SUBLINGUAL | 2 refills | Status: DC | PRN
  Filled 2022-01-17: qty 25, 14d supply, fill #0

## 2022-01-17 MED ORDER — LOSARTAN POTASSIUM 25 MG PO TABS
25.0000 mg | ORAL_TABLET | Freq: Every day | ORAL | Status: DC
  Administered 2022-01-17: 25 mg via ORAL
  Filled 2022-01-17: qty 1

## 2022-01-17 MED ORDER — SIMVASTATIN 20 MG PO TABS
20.0000 mg | ORAL_TABLET | Freq: Every day | ORAL | 2 refills | Status: DC
  Filled 2022-01-17: qty 30, 30d supply, fill #0

## 2022-01-17 MED ORDER — POTASSIUM CHLORIDE CRYS ER 20 MEQ PO TBCR
20.0000 meq | EXTENDED_RELEASE_TABLET | Freq: Every day | ORAL | 2 refills | Status: DC
  Filled 2022-01-17: qty 30, 30d supply, fill #0

## 2022-01-17 MED ORDER — TORSEMIDE 20 MG PO TABS
20.0000 mg | ORAL_TABLET | ORAL | 2 refills | Status: DC
  Filled 2022-01-17: qty 30, 30d supply, fill #0

## 2022-01-17 MED ORDER — BISOPROLOL FUMARATE 10 MG PO TABS
10.0000 mg | ORAL_TABLET | Freq: Every day | ORAL | 2 refills | Status: DC
  Filled 2022-01-17: qty 30, 30d supply, fill #0

## 2022-01-17 MED ORDER — FAMOTIDINE 20 MG PO TABS: 20.0000 mg | ORAL_TABLET | Freq: Every evening | ORAL | Status: DC | PRN

## 2022-01-17 MED ORDER — TORSEMIDE 20 MG PO TABS
20.0000 mg | ORAL_TABLET | Freq: Every day | ORAL | Status: DC
  Administered 2022-01-17: 20 mg via ORAL
  Filled 2022-01-17: qty 1

## 2022-01-17 MED ORDER — LOSARTAN POTASSIUM 25 MG PO TABS
25.0000 mg | ORAL_TABLET | Freq: Every day | ORAL | 2 refills | Status: DC
  Filled 2022-01-17: qty 30, 30d supply, fill #0

## 2022-01-17 NOTE — Progress Notes (Signed)
Patient complained about pain on her heels that has subsidized, and she voiced that it has happened the day before. Patient was reluctant to take tylenol and suggested to watch it, and if it does happen again, she will try the tylenol. Patient report that this kind of pain is  new to her and found it necessary to report it. We'll continue to monitor.

## 2022-01-17 NOTE — TOC Initial Note (Signed)
Transition of Care [facility]) - Initial/Assessment Note    Patient Details  Name: Mikayla [name] MRN: [id number] Date of Birth: 01/03/1926  [facility]) CM/SW Contact:    Erenest Rasher, RN Phone Number: 01/17/2022, 9:27 AM  Clinical Narrative:                 TOC CM spoke to pt and daughter at bedside. Pt is HOH, pt reports she lives alone but dtr assist her in the home. Dtr reports she does her transportation. Pt was independent PTA. Pt is requesting a bedside commode. [facility] for [facility] for home. Waiting PT/OT recommendations for home.   Expected Discharge Plan: Home/Self Care Barriers to Discharge: Continued Medical Work up   Patient Goals and CMS Choice Patient states their goals for this hospitalization and ongoing recovery are:: wants to remain independent CMS Medicare.gov Compare Post Acute Care list provided to:: Patient    Expected Discharge Plan and Services Expected Discharge Plan: Home/Self Care   Discharge Planning Services: CM Consult   Living arrangements for the past 2 months: Single Family Home                                      Prior Living Arrangements/Services Living arrangements for the past 2 months: Single Family Home Lives with:: Self Patient language and need for interpreter reviewed:: Yes Do you feel safe going back to the place where you live?: Yes      Need for Family Participation in Patient Care: Yes (Comment) Care giver support system in place?: Yes (comment) Current home services: DME (cane) Criminal Activity/Legal Involvement Pertinent to Current Situation/Hospitalization: No - Comment as needed  Activities of Daily Living      Permission Sought/Granted Permission sought to share information with : Case Manager, Family Supports, PCP Permission granted to share information with : Yes, Verbal Permission Granted  Share Information with NAME: Mikayla [name]     Permission granted to share info w  Relationship: daughter  Permission granted to share info w Contact Information: [phone]  Emotional Assessment Appearance:: Appears stated age Attitude/Demeanor/Rapport: Engaged Affect (typically observed): Accepting Orientation: : Oriented to Self, Oriented to Place, Oriented to  Time, Oriented to Situation   Psych Involvement: No (comment)  Admission diagnosis:  ST elevation myocardial infarction (STEMI), unspecified artery (HCC) [I21.3] Acute ST elevation myocardial infarction (STEMI) of inferior wall (HCC) [I21.19] Patient Active Problem List   Diagnosis Date Noted   Acute on chronic diastolic heart failure (HCC)    Hyponatremia    Acute ST elevation myocardial infarction (STEMI) of inferior wall ([place]) 01/15/2022   Right coronary artery occlusion (HCC)    Carotid stenosis, asymptomatic, left 06/19/2019   Carotid bruit 06/10/2019   DOE (dyspnea on exertion) 05/25/2019   Hyperkalemia 05/25/2019   AKI (acute kidney injury) ([place]) 05/25/2019   Chronic renal disease, stage III ([place]) 05/21/2019   Anemia, normocytic normochromic 12/19/2017   Acute upper respiratory infection 07/25/2016   Hypothyroidism, acquired 05/04/2016   Fatigue 09/22/2014   Essential hypertension 03/03/2014   Back pain 02/04/2012   Mild intermittent extrinsic asthma without complication 40/81/4481   PELVIC MASS 06/25/2008   CYSTITIS, ACUTE 06/09/2008   Diabetes mellitus type 2 in obese ([place]) 06/10/2007   Hyperlipidemia associated with type 2 diabetes mellitus ([place]) 06/10/2007   OBESITY 06/10/2007   RHINITIS 06/10/2007   Osteoporosis/osteopenia increased risk 06/10/2007  PCP:  Lauree Chandler, NP Pharmacy:   [facility], [address] [place] [address] Phone: [phone] Fax: [phone]  [place] [phone] [place], [place] - [address] AT [address] [place] [address] Phone:  [phone] Fax: [phone]  [facility] [address] [address] Phone: [phone] Fax: [phone]     Social Determinants of Health (SDOH) Interventions    Readmission Risk Interventions     No data to display

## 2022-01-17 NOTE — TOC CM/SW Note (Addendum)
HF TOC CM contacted [facility] and they will ship her 3n1 bedside commode to the home. TOC CM updated dtr via phone. [place], Heart Failure TOC CM [phone]

## 2022-01-17 NOTE — Discharge Summary (Signed)
Physician Discharge Summary   Patient ID: Mikayla [name] MRN: [id number] DOB/AGE: 1925-06-18 88 y.o. Mikayla Chandler, NP   Admit date: 01/15/2022 Discharge date: 01/17/2022  Primary Discharge Diagnosis Acute inferior STEMI due to occlusion of right coronary artery Acute on chronic diastolic heart failure with chronic hyponatremia Chronic stage IIIa kidney disease probably related to diabetes mellitus which is now well controlled on metformin Primary hypertension Mixed hyperlipidemia Asymptomatic high-grade left carotid artery stenosis, needs follow-up  Radiology:     [facility] Chest Port 1 View  01/15/2022 CLINICAL DATA:  Chest pain. EXAM: PORTABLE CHEST 1 VIEW COMPARISON:  Chest radiograph dated 05/25/2019. FINDINGS: Background of emphysema. No focal consolidation, pleural effusion, or pneumothorax. The cardiac silhouette is within limits. Atherosclerotic calcification of the aorta. No acute osseous pathology. Osteopenia. IMPRESSION: 1. No acute cardiopulmonary process. 2. Emphysema. Electronically Signed   By: Anner Crete M.D.   On: 01/15/2022 20:27    Cardiac Studies:    Carotid artery duplex 06/18/2019: Right Carotid: Velocities in the right ICA are consistent with a 1-39% stenosis. Non-hemodynamically significant plaque <50% noted in the CCA. Left Carotid: Velocities in the left ICA are consistent with a 60-79% stenosis. The ECA appears >50% stenosed. Unable to determine a more significant stenosis in the proximal ICA due to acoustic shadowing from plaque formation. Vertebrals:  Bilateral vertebral arteries demonstrate antegrade flow. Subclavians: Normal flow hemodynamics were seen in bilateral subclavian  arteries.    Echocardiogram 01/16/2022:  1. Left ventricular ejection fraction, by estimation, is 55 to 60%. The left ventricle has normal function. The left ventricle demonstrates regional wall motion abnormalities (see scoring diagram/findings for description). There is moderate  left ventricular hypertrophy. Left ventricular diastolic parameters are consistent with Grade II diastolic dysfunction (pseudonormalization). Elevated left ventricular end-diastolic pressure. The E/e' is 24.1. There is moderate hypokinesis of the left ventricular, basal-mid inferior wall.  2. Right ventricular systolic function is normal. The right ventricular size is normal. Mildly increased right ventricular wall thickness. Tricuspid regurgitation signal is inadequate for assessing PA pressure.  3. Left atrial size was mildly dilated.  4. The mitral valve is normal in structure. Moderate mitral valve regurgitation. No evidence of mitral stenosis.  5. The aortic valve is tricuspid. Aortic valve regurgitation is not visualized. Aortic valve sclerosis/calcification is present, without any evidence of aortic stenosis.  6. There is mild (Grade II) plaque.   Left Heart Catheterization 01/15/22:  LV not performed. LM: Calcified without luminal obstruction. LAD: Mild diffuse disease, focal 40% stenosis in the midsegment, mild to moderate diffuse calcification.  Small D1 and D2 and D3.  Faint collaterals are noted to the distal RCA. CX: Small calibered vessel.  Gives origin to a small caliber but long OM1 and OM 3.  Proximal to mid segment of Cx has a focal calcific 70% stenosis. RCA: Dominant vessel, gives origin to large PL and PDA.  Mildly diffusely calcified.  Occluder of the origin of RV branch. Intervention data: Successful PTCA and stenting of the mid RCA with implantation of a 3.5 x 16 mm Synergy XT DES at 12 atmospheric pressure, stenosis reduced from 100% to 0%, TIMI 0 to TIMI-3 flow.  65 mill contrast utilized.   Recommendation: Patient will be observed in the intensive care unit.  Cardiac rehab will be referred for.  She will need dual antiplatelet therapy for a year.  Medical therapy for CX disease.  Aggressive risk modification.   EKG:   EKG 01/16/2022: Sinus rhythm with first-degree AV block  at rate of 65 bpm, inferior infarct old.  Possibly posterior infarct old.  Nonspecific T abnormality.  Compared to prior EKG 01/15/2022, inferior STEMI not  Present  Hospital Course: Mikayla [name] is a 88 y.o. female  patient female patient with hypertension, hypercholesterolemia, diabetes mellitus controlled on metformin, stage IIIa chronic kidney disease, chronic leg edema who is admitted to the hospital on 01/15/2022 with back pain and shoulder pain, EKG revealed inferior STEMI and emergently transferred to [facility] for cardiac catheterization.  Patient underwent successful primary angioplasty to the dominant right coronary artery without any complications.  She had very small hematoma involving the right forearm of no clinical consequence, hemoglobin and renal function remained stable with contrast exposure and procedure.  Patient ambulated in the hallway without any chest pain or dyspnea, was kept for 48 hours of observation, no significant arrhythmias and felt stable for discharge today on 01/17/2022.  Recommendations on discharge: Patient has been on furosemide chronically, sodium levels have been low chronically, suspect fluid overload state with mild leg edema and elevated BNP.  I have discontinued furosemide and switched her to torsemide 20 mg daily along with potassium supplements 20 mEq daily.  She will be discharged home on increased dose of bisoprolol at 10 mg daily in view of hypertension, will start her on losartan 25 mg in the evening, aspirin indefinitely and Brilinta for a period of 1 year in view of ACS at 90 mg twice daily dosing.  She will need BMP and BNP in the outpatient basis in 1 week to 2 weeks.  With regard to hypercholesterolemia, patient has statin allergy/intolerance with atorvastatin.  I have started her on Vytorin 10/20 mg in the evening.  We will follow-up on the lipid profile.  Discharge Exam:    01/17/2022    3:02 AM 01/17/2022   12:28 AM 01/16/2022    8:23  PM  Vitals with BMI  Systolic [phone]  Diastolic 60 61 61  Pulse 59 59 64     Physical Exam Neck:     Vascular: No carotid bruit or JVD.  Cardiovascular:     Rate and Rhythm: Normal rate and regular rhythm.     Pulses: Intact distal pulses.          Carotid pulses are  on the left side with bruit.    Heart sounds: Normal heart sounds. No murmur heard.    No gallop.  Pulmonary:     Effort: Pulmonary effort is normal.     Breath sounds: Normal breath sounds.  Abdominal:     General: Bowel sounds are normal.     Palpations: Abdomen is soft.  Musculoskeletal:     Right lower leg: Edema (2+ ankle pitting) present.     Left lower leg: Edema (1-2+ ankle pitting) present.    Labs:   Lab Results  Component Value Date   WBC 14.7 (H) 01/16/2022   HGB 11.2 (L) 01/16/2022   HCT 31.9 (L) 01/16/2022   MCV 84.2 01/16/2022   PLT 233 01/16/2022    Recent Labs  Lab 01/13/22 1133 01/15/22 2351 01/16/22 0743  NA 132*   < > 130*  K 4.6   < > 4.2  CL 95*   < > 98  CO2 25   < > 21*  BUN 43*   < > 44*  CREATININE 1.54*   < > 1.30*  CALCIUM 9.8   < > 9.2  PROT 7.4  --   --  BILITOT 0.4  --   --   ALT 7  --   --   AST 12  --   --   GLUCOSE 138*   < > 120*   < > = values in this interval not displayed.    Lipid Panel     Component Value Date/Time   CHOL 291 (H) 06/21/2021 1035   TRIG 274.0 (H) 06/21/2021 1035   TRIG 160 (H) 04/23/2006 1043   HDL 44.00 06/21/2021 1035   CHOLHDL 7 06/21/2021 1035   VLDL 54.8 (H) 06/21/2021 1035   LDLCALC 125 (H) 03/13/2017 1012    BNP (last 3 results) Recent Labs    01/16/22 0743  BNP 639.2*    HEMOGLOBIN A1C Lab Results  Component Value Date   HGBA1C 6.4 (H) 01/13/2022   MPG 137 01/13/2022    Cardiac Panel (last 3 results) Recent Labs    01/15/22 2212 01/15/22 2351  TROPONINIHS 18,120* >24,000*     TSH Recent Labs    06/21/21 1035  TSH 1.93    FOLLOW UP PLANS AND APPOINTMENTS Discharge Instructions     AMB  Referral to Cardiac Rehabilitation - Phase II   Complete by: As directed    Diagnosis:  STEMI Coronary Stents     After initial evaluation and assessments completed: Virtual Based Care may be provided alone or in conjunction with Phase 2 Cardiac Rehab based on patient barriers.: Yes   Intensive Cardiac Rehabilitation (ICR) [place] location only        Allergies as of 01/17/2022       Reactions   Amoxil [amoxicillin] Rash   Lipitor [atorvastatin] Other (See Comments)   Myalgias    Motrin [ibuprofen] Other (See Comments)   Told to avoid due to kidney function   Statins Other (See Comments)   Myalgias         Medication List     STOP taking these medications    furosemide 20 MG tablet Commonly known as: LASIX   predniSONE 10 MG (21) Tbpk tablet Commonly known as: STERAPRED UNI-PAK 21 TAB       TAKE these medications    acetaminophen 500 MG tablet Commonly known as: TYLENOL Take 500 mg by mouth every 4 (four) hours as needed for mild pain or headache.   albuterol 108 (90 Base) MCG/ACT inhaler Commonly known as: VENTOLIN HFA Inhale 2 puffs into the lungs every 6 (six) hours as needed for wheezing or shortness of breath.   amLODipine 10 MG tablet Commonly known as: NORVASC TAKE 1 TABLET BY MOUTH DAILY   aspirin 81 MG tablet Take 81 mg by mouth daily.   bisoprolol 10 MG tablet Commonly known as: ZEBETA Take 1 tablet (10 mg total) by mouth at bedtime. What changed:  medication strength how much to take   budesonide-formoterol 80-4.5 MCG/ACT inhaler Commonly known as: SYMBICORT Inhale 2 puffs into the lungs 2 (two) times daily as needed (shortness of breath/wheezing).   CENTRUM SILVER PO Take 1 tablet by mouth daily.   dorzolamide-timolol 22.3-6.8 MG/ML ophthalmic solution Commonly known as: COSOPT Place 1 drop into both eyes 2 (two) times daily.   ezetimibe 10 MG tablet Commonly known as: ZETIA Take 1 tablet (10 mg total) by mouth daily.   famotidine 20  MG tablet Commonly known as: PEPCID Take 1 tablet (20 mg total) by mouth at bedtime as needed for heartburn (COUGH FLARE).   levothyroxine 50 MCG tablet Commonly known as: SYNTHROID TAKE 1 TABLET BY MOUTH  DAILY BEFORE BREAKFAST.   losartan 25 MG tablet Commonly known as: COZAAR Take 1 tablet (25 mg total) by mouth daily.   metFORMIN 500 MG tablet Commonly known as: GLUCOPHAGE Take 1 tablet (500 mg total) by mouth daily with breakfast.   methocarbamol 500 MG tablet Commonly known as: ROBAXIN Take 0.5 tablets (250 mg total) by mouth every 8 (eight) hours as needed for muscle spasms.   nitroGLYCERIN 0.4 MG SL tablet Commonly known as: NITROSTAT Place 1 tablet (0.4 mg total) under the tongue every 5 (five) minutes as needed for chest pain.   potassium chloride SA 20 MEQ tablet Commonly known as: KLOR-CON M Take 1 tablet (20 mEq total) by mouth daily.   simvastatin 20 MG tablet Commonly known as: ZOCOR Take 1 tablet (20 mg total) by mouth daily at 6 PM.   ticagrelor 90 MG Tabs tablet Commonly known as: BRILINTA Take 1 tablet (90 mg total) by mouth 2 (two) times daily.   torsemide 20 MG tablet Commonly known as: DEMADEX Take 1 tablet (20 mg total) by mouth every morning. As directed   VITAMIN D-3 PO Take 1 capsule by mouth daily.               Durable Medical Equipment  (From admission, onward)           Start     Ordered   01/17/22 0930  For home use only DME 3 n 1  Once        01/17/22 4166            Follow-up Information     Adrian Prows, MD Follow up on 01/25/2022.   Specialty: Cardiology Why: 10:30 AM. Please bring all medications to the appointment Contact information: [place] [address] [phone]                  Adrian Prows, MD, [facility] 01/17/2022, 9:40 AM Office: [phone]

## 2022-01-17 NOTE — Progress Notes (Signed)
CARDIAC REHAB PHASE I   PRE:  Rate/Rhythm: 63/ NSR 1st degree HB    BP: sitting 149/64    SaO2: 99  MODE:  Ambulation: 350 ft   POST:  Rate/Rhythm: 63/ NSR 1st degree HB    BP: sitting 160/49   Patient ambulated 350 ft with rolling walker, standby assist, pt tolerated well, denies CP, SOB, nausea, or dizziness. Patient educated on risk factors, exercise guidelines, angina, NTG, Brilinta, stent, cardiac catheterization site management, restrictions, MI, MI book given, nutrition, consulted to cardiac rehab phase 2, no further questions or concerns at this time. [phone] Albertine Grates RN 01/17/2022 9:43 AM

## 2022-01-17 NOTE — TOC Benefit Eligibility Note (Addendum)
Patient Teacher, English as a foreign language completed.    The patient is currently admitted and upon discharge could be taking Brilinta '90mg'$ .  The current 30 day co-pay is $247.31 due to a deductible of $370.48.   The patient is insured through [employer], [place] Patient Advocate Specialist [place] Patient Advocate Team Direct Number: [phone]  Fax: [phone]

## 2022-01-17 NOTE — Telephone Encounter (Signed)
Pharmacy Patient Advocate Encounter  Insurance verification completed.    The patient is insured through [employer]   The patient is currently admitted and ran test claims for the following: Brilinta.  Copays and coinsurance results were relayed to Inpatient clinical team.

## 2022-01-17 NOTE — Progress Notes (Signed)
Noted order for DME- 3n1- per bedside RNRonalee Belts- pt mobilized w/ RW without difficulty- pt did not indicate she needed/wanted 3n1 prior to discharge.

## 2022-01-17 NOTE — Plan of Care (Signed)
[place] to be discharged home per MD order. Discussed prescriptions and follow up appointments with the patient. Prescriptions given to patient; medication list explained in detail. Patient verbalized understanding.  Skin clean, dry and intact without evidence of skin break down, no evidence of skin tears noted. IV catheter discontinued intact. Site without signs and symptoms of complications. Dressing and pressure applied. Pt denies pain at the site currently. No complaints noted.  Patient free of lines, drains, and wounds.   An After Visit Summary (AVS) was printed and given to the patient. Patient escorted via wheelchair, and discharged home via private auto.  Stephan Minister, RN

## 2022-01-18 ENCOUNTER — Telehealth: Payer: Self-pay | Admitting: *Deleted

## 2022-01-18 ENCOUNTER — Telehealth: Payer: Self-pay

## 2022-01-18 LAB — LIPOPROTEIN A (LPA): Lipoprotein (a): 38.3 nmol/L — ABNORMAL HIGH (ref <75.0)

## 2022-01-18 NOTE — Telephone Encounter (Signed)
Location of hospitalization: [place] Reason for hospitalization: Back and chest pain  Date of discharge: 01/17/2022 Date of first communication with patient: today Person contacting patient: Michail Sermon Current symptoms: Patient feels kind of weak. Do you understand why you were in the Hospital: Yes Questions regarding discharge instructions: None Where were you discharged to: Home Medications reviewed: Yes Allergies reviewed: Yes Dietary changes reviewed: Yes. Discussed low fat and low salt diet.  Referals reviewed: NA Activities of Daily Living: Able to with mild limitations. Instructed she cannot lift anything more than a gallon of milk. Any transportation issues/concerns: None Any patient concerns: None Confirmed importance & date/time of Follow up appt: Yes Confirmed with patient if condition begins to worsen call. Pt was given the office number and encouraged to call back with questions or concerns: Yes

## 2022-01-18 NOTE — Patient Outreach (Signed)
  Care Coordination [facility] Note Transition Care Management Unsuccessful Follow-up Telephone Call  Date of discharge and from where:  [facility] [phone]  Attempts:  2nd Attempt  Reason for unsuccessful TCM follow-up call:  Left voice message  [place] Management [phone]

## 2022-01-18 NOTE — Patient Outreach (Signed)
  Care Coordination   Follow Up Visit Note   01/18/2022 Name: Mikayla [name] MRN: [id number] DOB: 06-03-1925  Mikayla [name] is a 88 y.o. year old female who sees Eubanks, Carlos American, NP for primary care. I spoke with  Mikayla [name] and Daughter Mikayla [name] by phone today.  Patient and daughter called to go over all the discharge medications.  RN explained the discharge medications in full and  their  mechanism of use.    SDOH assessments and interventions completed:  No     Care Coordination Interventions Activated:  No  Care Coordination Interventions:  No, not indicated   Follow up plan: No further intervention required.   Encounter Outcome:  Pt. Visit Completed    [place] Management [phone]

## 2022-01-20 ENCOUNTER — Telehealth: Payer: Self-pay | Admitting: Cardiology

## 2022-01-20 DIAGNOSIS — R0602 Shortness of breath: Secondary | ICD-10-CM | POA: Diagnosis not present

## 2022-01-20 NOTE — Telephone Encounter (Signed)
It is from [employer]. Dring Coke or mountain dew 30 min before

## 2022-01-20 NOTE — Telephone Encounter (Signed)
Patient says she is having sob, needs to speak with someone today about this. Please call her back at [phone].

## 2022-01-20 NOTE — Telephone Encounter (Signed)
Called pt back. Pt mention she has been put on new medication. Pt mention she has some SOB when laying and standing up. Pt denies cp, pt mention she has back pain.  Please advise.

## 2022-01-20 NOTE — Telephone Encounter (Signed)
Conveyed to the patient and her daughter Mikayla [name] over the phone to consider caffeinated beverages 30 minutes prior to taking Brilinta to see if her shortness of breath improves.  Patient has not been having symptoms of orthopnea PND or lower extremity swelling to suggest heart failure.  She continues to have hematoma at the site of the heart catheterization.  No significant tightness or blood loss or induration per her daughter Mikayla [name].  She is moving her hands able to make a fist without any significant pain or discomfort.  She had a lot of questions regarding when to seek medical attention and who to go about doing so (self drive to hospital, med alert, call 911, ect).   I tried my best to explain to the patient's daughter Mikayla [name] that if she has symptoms similar to 01/15/2022 when she came in with an acute MI she needs to go to the closest ER via EMS.  If she has recurrence of chest pain or shortness of breath that increases in intensity frequency and/or duration she should also seek medical attention by calling 911.   She is more than welcome to call the on-call number for further assistance if needed.  Total time spent: 20 minutes  Lathen Seal Jordan, DO, [facility]

## 2022-01-20 NOTE — Telephone Encounter (Signed)
Called pt no answer, left a vm

## 2022-01-21 ENCOUNTER — Telehealth: Payer: Self-pay | Admitting: Cardiology

## 2022-01-21 DIAGNOSIS — I2111 ST elevation (STEMI) myocardial infarction involving right coronary artery: Secondary | ICD-10-CM

## 2022-01-21 DIAGNOSIS — R0602 Shortness of breath: Secondary | ICD-10-CM

## 2022-01-21 MED ORDER — CLOPIDOGREL BISULFATE 75 MG PO TABS: 75.0000 mg | ORAL_TABLET | Freq: Every day | ORAL | 3 refills | Status: DC

## 2022-01-21 NOTE — Telephone Encounter (Signed)
ON-CALL CARDIOLOGY 01/21/22  Patient's name: Mikayla [name].   MRN: [id number].    DOB: [birth year] Primary care provider: Lauree Chandler, NP. Primary cardiologist: Dr. Adrian Prows  Interaction regarding this patient's care today: Received a call from the patient's daughter regarding mom not feeling well.   After discussion yesterday she took caffeinated beverages 30 minutes prior to taking Brilinta.  As a result of the caffeinated beverages she was not able to sleep throughout the entire night and was having intermittent episodes of shortness of breath and back pain.  She is describing the sensation as " feeling as if I ran a block."   Impression:   ICD-10-CM   1. ST elevation myocardial infarction involving right coronary artery (HCC)  I21.11     2. Shortness of breath  R06.02       No orders of the defined types were placed in this encounter.   No orders of the defined types were placed in this encounter.   Recommendations: Spoke to Dr. Einar Gip (interventional cardiology) he is in agreement to change her from Brilinta to Plavix.   Both patient and daughter are asked to take Plavix '75mg'$  po two tabs x 1 today and start one tab daily starting 01/22/2022.   Telephone encounter total time: 15 minutes.   Rex Kras, [place], [facility]  Pager: [phone] Office: [phone]

## 2022-01-25 ENCOUNTER — Encounter: Payer: Self-pay | Admitting: Cardiology

## 2022-01-25 ENCOUNTER — Ambulatory Visit: Payer: Medicare Other | Admitting: Cardiology

## 2022-01-25 VITALS — BP 145/49 | HR 52 | Temp 98.1°F | Resp 16 | Ht <= 58 in | Wt 120.4 lb

## 2022-01-25 DIAGNOSIS — I219 Acute myocardial infarction, unspecified: Secondary | ICD-10-CM | POA: Diagnosis not present

## 2022-01-25 DIAGNOSIS — E7800 Pure hypercholesterolemia, unspecified: Secondary | ICD-10-CM

## 2022-01-25 DIAGNOSIS — I10 Essential (primary) hypertension: Secondary | ICD-10-CM

## 2022-01-25 DIAGNOSIS — I6522 Occlusion and stenosis of left carotid artery: Secondary | ICD-10-CM | POA: Diagnosis not present

## 2022-01-25 DIAGNOSIS — I5033 Acute on chronic diastolic (congestive) heart failure: Secondary | ICD-10-CM

## 2022-01-25 DIAGNOSIS — R001 Bradycardia, unspecified: Secondary | ICD-10-CM

## 2022-01-25 NOTE — Progress Notes (Addendum)
Primary Physician/Referring:  Lauree Chandler, NP  Patient ID: Mikayla [name], female    DOB: Jul 28, 1925, 88 y.o.   MRN: [id number]  Chief Complaint  Patient presents with   Follow-up   HPI:    Mikayla [name]  is a 88 y.o.female patient with hypertension, hypercholesterolemia, diabetes mellitus controlled on metformin, stage IIIa chronic kidney disease, chronic leg edema on chronic furosemide, chronic hyponatremia, who is admitted to the hospital on 01/15/2022 with back pain and shoulder pain, EKG revealed inferior STEMI and emergently transferred to [facility] for cardiac catheterization.   Patient underwent successful primary angioplasty to the dominant right coronary artery with very small hematoma involving the right forearm.  She also had mild acute diastolic heart failure.  She now presents for [facility] visit.   She has not had any further anginal episodes and she has not used any sublingual nitroglycerin.  Brilinta was discontinued post discharge and started on Plavix in view of side effect of Brilinta with marked dyspnea.  Past Medical History:  Diagnosis Date   Chronic rhinitis    CKD (chronic kidney disease), stage III (HCC)    Disorder of bone and cartilage, unspecified    Essential hypertension, benign    Glaucoma    Per [facility] new patient packet   Hearing loss    Per [place] new patient packet   High blood pressure    Per [facility] new patient packet   High cholesterol    Per [place] new patient packet   Obesity, unspecified    Other and unspecified hyperlipidemia    Type II or unspecified type diabetes mellitus without mention of complication, not stated as uncontrolled    Vision loss    Due to catarct Per [place] new patient packet   Past Surgical History:  Procedure Laterality Date   CESAREAN SECTION N/A    CORONARY/GRAFT ACUTE MI REVASCULARIZATION N/A 01/15/2022   Procedure: Coronary/Graft Acute MI Revascularization;  Surgeon: Adrian Prows, MD;  Location: [place]  [facility];  Service: Cardiovascular;  Laterality: N/A;   ESOPHAGOGASTRODUODENOSCOPY N/A 04/29/2014   Procedure: ESOPHAGOGASTRODUODENOSCOPY (EGD);  Surgeon: Milus Banister, MD;  Location: [facility];  Service: Endoscopy;  Laterality: N/A;   LEFT HEART CATH AND CORONARY ANGIOGRAPHY N/A 01/15/2022   Procedure: LEFT HEART CATH AND CORONARY ANGIOGRAPHY;  Surgeon: Adrian Prows, MD;  Location: [place] [facility];  Service: Cardiovascular;  Laterality: N/A;   TOTAL ABDOMINAL HYSTERECTOMY     Family History  Problem Relation Age of Onset   COPD Mother    Lung cancer Father    Prostate cancer Other    Multiple myeloma Son    Colon cancer Neg Hx    Esophageal cancer Neg Hx    Rectal cancer Neg Hx    Stomach cancer Neg Hx     Social History   Tobacco Use   Smoking status: Never   Smokeless tobacco: Never  Substance Use Topics   Alcohol use: Yes    Alcohol/week: 7.0 standard drinks of alcohol    Types: 7 Standard drinks or equivalent per week    Comment: wine with dinner   Marital Status: Married  ROS  Review of Systems  Cardiovascular:  Negative for chest pain, dyspnea on exertion and leg swelling.   Objective      01/25/2022   11:46 AM 01/25/2022   10:22 AM 01/17/2022    3:02 AM  Vitals with BMI  Height  4' 10"    Weight  120  lbs 6 oz   BMI  48.18   Systolic 563 149 702  Diastolic 49 69 60  Pulse 52 53 59   Today's Vitals   01/25/22 1022 01/25/22 1146  BP: (!) 152/69 (!) 145/49  Pulse: (!) 53 (!) 52  Resp: 16   Temp: 98.1 F (36.7 C)   TempSrc: Temporal   SpO2: 98%   Weight: 120 lb 6.4 oz (54.6 kg)   Height: 4' 10"  (1.473 m)    Body mass index is 25.16 kg/m.  Physical Exam Neck:     Vascular: No carotid bruit or JVD.  Cardiovascular:     Rate and Rhythm: Normal rate and regular rhythm.     Pulses: Intact distal pulses.          Carotid pulses are  on the right side with bruit and  on the left side with bruit.    Heart sounds: Murmur heard.     Midsystolic murmur is  present with a grade of 2/6 radiating to the apex.     No gallop.  Pulmonary:     Effort: Pulmonary effort is normal.     Breath sounds: Normal breath sounds.  Abdominal:     General: Bowel sounds are normal.     Palpations: Abdomen is soft.  Musculoskeletal:     Right lower leg: No edema.     Left lower leg: No edema.     Medications and allergies   Allergies  Allergen Reactions   Amoxil [Amoxicillin] Rash   Lipitor [Atorvastatin] Other (See Comments)    Myalgias    Motrin [Ibuprofen] Other (See Comments)    Told to avoid due to kidney function   Statins Other (See Comments)    Myalgias      Medication list after today's encounter   Current Outpatient Medications:    acetaminophen (TYLENOL) 500 MG tablet, Take 500 mg by mouth every 4 (four) hours as needed for mild pain or headache., Disp: , Rfl:    albuterol (VENTOLIN HFA) 108 (90 Base) MCG/ACT inhaler, Inhale 2 puffs into the lungs every 6 (six) hours as needed for wheezing or shortness of breath., Disp: 8 g, Rfl: 2   amLODipine (NORVASC) 10 MG tablet, TAKE 1 TABLET BY MOUTH DAILY (Patient taking differently: Take 10 mg by mouth daily.), Disp: 90 tablet, Rfl: 1   aspirin 81 MG tablet, Take 81 mg by mouth daily., Disp: , Rfl:    budesonide-formoterol (SYMBICORT) 80-4.5 MCG/ACT inhaler, Inhale 2 puffs into the lungs 2 (two) times daily as needed (shortness of breath/wheezing)., Disp: 1 each, Rfl: 5   Cholecalciferol (VITAMIN D-3 PO), Take 1 capsule by mouth daily., Disp: , Rfl:    clopidogrel (PLAVIX) 75 MG tablet, Take 1 tablet (75 mg total) by mouth daily., Disp: 90 tablet, Rfl: 3   dorzolamide-timolol (COSOPT) 22.3-6.8 MG/ML ophthalmic solution, Place 1 drop into both eyes 2 (two) times daily. , Disp: , Rfl:    ezetimibe (ZETIA) 10 MG tablet, Take 1 tablet (10 mg total) by mouth daily., Disp: 30 tablet, Rfl: 2   famotidine (PEPCID) 20 MG tablet, Take 1 tablet (20 mg total) by mouth at bedtime as needed for heartburn (COUGH  FLARE)., Disp: , Rfl:    levothyroxine (SYNTHROID) 50 MCG tablet, TAKE 1 TABLET BY MOUTH DAILY BEFORE BREAKFAST. (Patient taking differently: Take 50 mcg by mouth daily before breakfast.), Disp: 90 tablet, Rfl: 3   losartan (COZAAR) 25 MG tablet, Take 1 tablet (25 mg total) by mouth  daily., Disp: 30 tablet, Rfl: 2   metFORMIN (GLUCOPHAGE) 500 MG tablet, Take 1 tablet (500 mg total) by mouth daily with breakfast., Disp: 180 tablet, Rfl: 0   methocarbamol (ROBAXIN) 500 MG tablet, Take 0.5 tablets (250 mg total) by mouth every 8 (eight) hours as needed for muscle spasms., Disp: 30 tablet, Rfl: 0   Multiple Vitamins-Minerals (CENTRUM SILVER PO), Take 1 tablet by mouth daily., Disp: , Rfl:    nitroGLYCERIN (NITROSTAT) 0.4 MG SL tablet, Place 1 tablet (0.4 mg total) under the tongue every 5 (five) minutes as needed for chest pain., Disp: 25 tablet, Rfl: 2   torsemide (DEMADEX) 20 MG tablet, Take 1 tablet (20 mg total) by mouth every morning. As directed, Disp: 30 tablet, Rfl: 2   bisoprolol (ZEBETA) 5 MG tablet, Take 1 tablet (5 mg total) by mouth at bedtime., Disp: , Rfl:    potassium chloride SA (KLOR-CON M) 20 MEQ tablet, Take 1 tablet (20 mEq total) by mouth daily. (Patient not taking: Reported on 01/25/2022), Disp: 30 tablet, Rfl: 2   simvastatin (ZOCOR) 20 MG tablet, Take 1 tablet (20 mg total) by mouth daily at 6 PM., Disp: 30 tablet, Rfl: 2  Laboratory examination:   Lab Results  Component Value Date   NA 137 01/25/2022   K 5.0 01/25/2022   CO2 20 01/25/2022   GLUCOSE 121 (H) 01/25/2022   BUN 54 (H) 01/25/2022   CREATININE 1.47 (H) 01/25/2022   CALCIUM 9.2 01/25/2022   EGFR 33 (L) 01/25/2022   GFRNONAA 38 (L) 01/16/2022       Latest Ref Rng & Units 01/25/2022   12:40 PM 01/16/2022    7:43 AM 01/15/2022   11:51 PM  BMP  Glucose 70 - 99 mg/dL 121  120  181   BUN 10 - 36 mg/dL 54  44  51   Creatinine 0.57 - 1.00 mg/dL 1.47  1.30  1.35   BUN/Creat Ratio 12 - 28 37     Sodium 134 - 144  mmol/L 137  130  128   Potassium 3.5 - 5.2 mmol/L 5.0  4.2  4.0   Chloride 96 - 106 mmol/L 102  98  97   CO2 20 - 29 mmol/L 20  21  21    Calcium 8.7 - 10.3 mg/dL 9.2  9.2  9.1       Latest Ref Rng & Units 01/13/2022   11:33 AM 06/21/2021   10:35 AM 06/15/2020   10:53 AM  Hepatic Function  Total Protein 6.1 - 8.1 g/dL 7.4  7.1  6.8   Albumin 3.5 - 5.2 g/dL  4.1  4.0   AST 10 - 35 U/L 12  12  11    ALT 6 - 29 U/L 7  8  7    Alk Phosphatase 39 - 117 U/L  34  36   Total Bilirubin 0.2 - 1.2 mg/dL 0.4  0.5  0.4   Bilirubin, Direct 0.0 - 0.3 mg/dL  0.0  0.1     Lipid Panel Recent Labs    06/21/21 1035  CHOL 291*  TRIG 274.0*  VLDL 54.8*  HDL 44.00  CHOLHDL 7  LDLDIRECT 156.0   Lipoprotein (a) <75.0 nmol/L 38.3 High      HEMOGLOBIN A1C Lab Results  Component Value Date   HGBA1C 6.4 (H) 01/13/2022   MPG 137 01/13/2022   TSH Recent Labs    06/21/21 1035  TSH 1.93   BNP (last 3 results) Recent Labs  01/16/22 0743  BNP 639.2*    ProBNP (last 3 results) No results for input(s): "PROBNP" in the last 8760 hours.  Radiology:    Cardiac Studies:   Carotid artery duplex 07/02/2019: Right Carotid: Velocities in the right ICA are consistent with a 1-39% stenosis. Non-hemodynamically significant plaque <50% noted in the CCA. Left Carotid: Velocities in the left ICA are consistent with a 60-79% stenosis. The ECA appears >50% stenosed. Unable to determine a more significant stenosis in the proximal ICA due to acoustic shadowing from plaque formation. Vertebrals:  Bilateral vertebral arteries demonstrate antegrade flow. Subclavians: Normal flow hemodynamics were seen in bilateral subclavian  arteries.    Echocardiogram 01/16/2022:  1. Left ventricular ejection fraction, by estimation, is 55 to 60%. The left ventricle has normal function. The left ventricle demonstrates regional wall motion abnormalities (see scoring diagram/findings for description). There is moderate left  ventricular hypertrophy. Left ventricular diastolic parameters are consistent with Grade II diastolic dysfunction (pseudonormalization). Elevated left ventricular end-diastolic pressure. The E/e' is 24.1. There is moderate hypokinesis of the left ventricular, basal-mid inferior wall.  2. Right ventricular systolic function is normal. The right ventricular size is normal. Mildly increased right ventricular wall thickness. Tricuspid regurgitation signal is inadequate for assessing PA pressure.  3. Left atrial size was mildly dilated.  4. The mitral valve is normal in structure. Moderate mitral valve regurgitation. No evidence of mitral stenosis.  5. The aortic valve is tricuspid. Aortic valve regurgitation is not visualized. Aortic valve sclerosis/calcification is present, without any evidence of aortic stenosis.  6. There is mild (Grade II) plaque.    Left Heart Catheterization 01/15/22:  LV not performed. LM: Calcified without luminal obstruction. LAD: Mild diffuse disease, focal 40% stenosis in the midsegment, mild to moderate diffuse calcification.  Small D1 and D2 and D3.  Faint collaterals are noted to the distal RCA. CX: Small calibered vessel.  Gives origin to a small caliber but long OM1 and OM 3.  Proximal to mid segment of Cx has a focal calcific 70% stenosis. RCA: Dominant vessel, gives origin to large PL and PDA.  Mildly diffusely calcified.  Occluder of the origin of RV branch. Intervention data: Successful PTCA and stenting of the mid RCA with implantation of a 3.5 x 16 mm Synergy XT DES at 12 atmospheric pressure, stenosis reduced from 100% to 0%, TIMI 0 to TIMI-3 flow.  65 mill contrast utilized.   Recommendation: Patient will be observed in the intensive care unit.  Cardiac rehab will be referred for.  She will need dual antiplatelet therapy for a year.  Medical therapy for CX disease.  Aggressive risk modification.     EKG:   EKG 01/25/2022: Marked sinus bradycardia with  first-degree AV block at rate of 48 bpm, left axis deviation, left anterior fascicular block.  Cannot exclude inferior infarct old.  T wave abnormality, cannot exclude inferior ischemia.  Normal QT interval.    EKG 01/16/2022: Sinus rhythm with first-degree AV block at rate of 65 bpm, inferior infarct old.  Possibly posterior infarct old.  Nonspecific T abnormality.  Compared to prior EKG 01/15/2022, inferior STEMI not present  Assessment     ICD-10-CM   1. Myocardial infarction less than 4 weeks ago ([place])  I21.9 EKG 12-Lead    bisoprolol (ZEBETA) 5 MG tablet    Amb Referral to Cardiac Rehabilitation    simvastatin (ZOCOR) 20 MG tablet    Brain natriuretic peptide    Basic metabolic panel    CANCELED: Amb  Referral to Cardiac Rehabilitation    2. Carotid stenosis, asymptomatic, left  I65.22 PCV CAROTID DUPLEX (BILATERAL)    3. Hypercholesteremia  E78.00 simvastatin (ZOCOR) 20 MG tablet    4. Primary hypertension  T70 Basic metabolic panel    5. Bradycardia by electrocardiogram  R00.1     6. Acute on chronic diastolic heart failure (HCC)  I50.33 Brain natriuretic peptide    Basic metabolic panel       Orders Placed This Encounter  Procedures   Basic metabolic panel   Brain natriuretic peptide   Basic metabolic panel   Amb Referral to Cardiac Rehabilitation    Referral Priority:   Routine    Referral Type:   Consultation    Number of Visits Requested:   1   EKG 12-Lead    Meds ordered this encounter  Medications   simvastatin (ZOCOR) 20 MG tablet    Sig: Take 1 tablet (20 mg total) by mouth daily at 6 PM.    Dispense:  30 tablet    Refill:  2    Medications Discontinued During This Encounter  Medication Reason   simvastatin (ZOCOR) 20 MG tablet    bisoprolol (ZEBETA) 10 MG tablet      Recommendations:   Mikayla [name] is a 88 y.o. female patient with hypertension, hypercholesterolemia, diabetes mellitus controlled on metformin, stage IIIa chronic kidney disease,  chronic leg edema on chronic furosemide, chronic hyponatremia, who is admitted to the hospital on 01/15/2022 with back pain and shoulder pain, EKG revealed inferior STEMI and emergently transferred to [facility] for cardiac catheterization.   Patient underwent successful primary angioplasty to the dominant right coronary artery with very small hematoma involving the right forearm.  She also had mild acute diastolic heart failure.  She now presents for [facility] visit.  She has not had any further anginal episodes and she has not used any sublingual nitroglycerin.  Brilinta was discontinued post discharge and started on Plavix in view of side effect of Brilinta with marked dyspnea.  She has discontinued taking simvastatin with no change in symptoms of aches and pains, hence she will restart this.  She is markedly bradycardic, in view of advanced age, will reduce her bisoprolol dose back to 5 mg which she was tolerating prior to hospitalization.  Reviewed her renal function, has remained stable.  She is presently on torsemide, she is severely hyponatremic.  BNP is clearly elevated and hence I suggest that we continue with diuretics on a daily basis, discussed with her regarding strict adherence to low-salt diet.  We would like to continue to trend her sodium levels as we go along.  Echocardiogram also suggested pseudonormal pattern.  She has asymptomatic bilateral carotid artery stenosis by previous duplex which has not had any further evaluation, will reestablish baseline.  Although 88 years of age, continues to live independently and is doing well.  She is accompanied by her daughter, all questions answered.   Addendum: Hyponatremia persist on repeat BMP and BNP is elevated suggesting acute diastolic heart failure.  I have sent message to continue torsemide and potassium supplements daily and to restrict salt, repeat labs in 2 to 3 weeks for follow-up of hyponatremia and heart failure.   Adrian Prows,  MD, [facility] 01/26/2022, 11:25 PM Office: [phone]

## 2022-01-26 LAB — BASIC METABOLIC PANEL
BUN/Creatinine Ratio: 37 — ABNORMAL HIGH (ref 12–28)
BUN: 54 mg/dL — ABNORMAL HIGH (ref 10–36)
CO2: 20 mmol/L (ref 20–29)
Calcium: 9.2 mg/dL (ref 8.7–10.3)
Chloride: 102 mmol/L (ref 96–106)
Creatinine, Ser: 1.47 mg/dL — ABNORMAL HIGH (ref 0.57–1.00)
Glucose: 121 mg/dL — ABNORMAL HIGH (ref 70–99)
Potassium: 5 mmol/L (ref 3.5–5.2)
Sodium: 137 mmol/L (ref 134–144)
eGFR: 33 mL/min/{1.73_m2} — ABNORMAL LOW (ref >59)

## 2022-01-26 MED ORDER — SIMVASTATIN 20 MG PO TABS: 20.0000 mg | ORAL_TABLET | Freq: Every day | ORAL | 2 refills | Status: DC

## 2022-01-26 NOTE — Addendum Note (Signed)
Addended by: Kela Millin on: 01/26/2022 11:26 PM   Modules accepted: Orders

## 2022-01-26 NOTE — Progress Notes (Signed)
Please advice patient that she should strictly watch our for excess salt, not just adding salt but also in foods like ham, pepperoni, soup.   Her sodium levels are los due to congestive heart failure.  She should continue Torsemide and potassium daily for now. I will repeat labs in 2-3 weeks (needs to go to [facility] anytime and no fasting needed).  Should restart Simvastatin 20 mg in the evening for cholesterol.

## 2022-01-27 ENCOUNTER — Other Ambulatory Visit (HOSPITAL_COMMUNITY): Payer: Self-pay

## 2022-01-27 NOTE — Progress Notes (Signed)
Daughter read the results on MyChart and both her and patient called concerned with patient restarting a statin because she does not tolerate it well. Also, patient is having a lot of trouble taking her potassium and wants to know if she can put it in water to dissolve it, and take it like that?  Please advise.

## 2022-01-28 ENCOUNTER — Other Ambulatory Visit: Payer: Self-pay | Admitting: Cardiology

## 2022-01-28 DIAGNOSIS — I5033 Acute on chronic diastolic (congestive) heart failure: Secondary | ICD-10-CM

## 2022-01-28 MED ORDER — POTASSIUM CHLORIDE 20 MEQ PO PACK: 20.0000 meq | PACK | Freq: Every day | ORAL | 2 refills | Status: DC | PRN

## 2022-02-02 ENCOUNTER — Ambulatory Visit: Payer: Medicare Other | Admitting: Podiatry

## 2022-02-09 ENCOUNTER — Ambulatory Visit (INDEPENDENT_AMBULATORY_CARE_PROVIDER_SITE_OTHER): Payer: Medicare Other | Admitting: Podiatry

## 2022-02-09 DIAGNOSIS — M79674 Pain in right toe(s): Secondary | ICD-10-CM

## 2022-02-09 DIAGNOSIS — B351 Tinea unguium: Secondary | ICD-10-CM | POA: Diagnosis not present

## 2022-02-09 DIAGNOSIS — E669 Obesity, unspecified: Secondary | ICD-10-CM

## 2022-02-09 DIAGNOSIS — E1169 Type 2 diabetes mellitus with other specified complication: Secondary | ICD-10-CM | POA: Diagnosis not present

## 2022-02-09 DIAGNOSIS — M79675 Pain in left toe(s): Secondary | ICD-10-CM

## 2022-02-09 DIAGNOSIS — L84 Corns and callosities: Secondary | ICD-10-CM

## 2022-02-09 NOTE — Patient Instructions (Signed)
Look for Voltaren gel at the pharmacy over the counter or online (also known as diclofenac 1% gel). Apply to the painful areas 3-4x daily with the supplied dosing card. Allow to dry for 10 minutes before going into socks/shoes  

## 2022-02-11 NOTE — Progress Notes (Signed)
  Subjective:  Patient ID: Mikayla [name], female    DOB: [birth year],  MRN: [id number]  No chief complaint on file.   88 y.o. female presents with the above complaint. History confirmed with patient.  Last debridement was very helpful.  No new problems.  Objective:  Physical Exam: warm, good capillary refill, no trophic changes or ulcerative lesions, normal DP and PT pulses, normal monofilament exam, normal sensory exam, and varicose veins were noted. Left Foot: dystrophic yellowed discolored nail plates with subungual debris and submetatarsal 1 and 5 hyperkeratotic lesion tender to touch Right Foot: dystrophic yellowed discolored nail plates with subungual debris, submetatarsal 5 hyperkeratotic lesion tender to touch Assessment:   1. Pain due to onychomycosis of toenails of both feet   2. Callus of foot   3. Diabetes mellitus type 2 in obese [facility])      Plan:  Patient was evaluated and treated and all questions answered.  Patient educated on diabetes. Discussed proper diabetic foot care and discussed risks and complications of disease. Educated patient in depth on reasons to return to the office immediately should he/she discover anything concerning or new on the feet. All questions answered. Discussed proper shoes as well.   Discussed the etiology and treatment options for the condition in detail with the patient. Educated patient on the topical and oral treatment options for mycotic nails. Recommended debridement of the nails today. Sharp and mechanical debridement performed of all painful and mycotic nails today. Nails debrided in length and thickness using a nail nipper to level of comfort. Discussed treatment options including appropriate shoe gear. Follow up as needed for painful nails.   All symptomatic hyperkeratoses were safely debrided with a sterile #15 blade to patient's level of comfort without incident. We discussed preventative and palliative care of these lesions  including supportive and accommodative shoegear, padding, prefabricated and custom molded accommodative orthoses, use of a pumice stone and lotions/creams daily.   Return in about 3 months (around 05/11/2022) for at risk diabetic foot care.

## 2022-02-16 ENCOUNTER — Other Ambulatory Visit (HOSPITAL_COMMUNITY): Payer: Self-pay

## 2022-02-17 ENCOUNTER — Ambulatory Visit: Payer: Medicare Other

## 2022-02-17 DIAGNOSIS — I6522 Occlusion and stenosis of left carotid artery: Secondary | ICD-10-CM

## 2022-02-24 ENCOUNTER — Ambulatory Visit: Payer: Medicare Other | Admitting: Cardiology

## 2022-02-24 ENCOUNTER — Encounter: Payer: Self-pay | Admitting: Cardiology

## 2022-02-24 VITALS — BP 166/61 | HR 66 | Temp 98.6°F | Resp 16 | Ht <= 58 in | Wt 122.8 lb

## 2022-02-24 DIAGNOSIS — I2111 ST elevation (STEMI) myocardial infarction involving right coronary artery: Secondary | ICD-10-CM | POA: Diagnosis not present

## 2022-02-24 DIAGNOSIS — I5033 Acute on chronic diastolic (congestive) heart failure: Secondary | ICD-10-CM

## 2022-02-24 DIAGNOSIS — I25118 Atherosclerotic heart disease of native coronary artery with other forms of angina pectoris: Secondary | ICD-10-CM | POA: Diagnosis not present

## 2022-02-24 DIAGNOSIS — I6522 Occlusion and stenosis of left carotid artery: Secondary | ICD-10-CM | POA: Diagnosis not present

## 2022-02-24 DIAGNOSIS — I10 Essential (primary) hypertension: Secondary | ICD-10-CM | POA: Diagnosis not present

## 2022-02-24 MED ORDER — ISOSORBIDE DINITRATE 30 MG PO TABS: 30.0000 mg | ORAL_TABLET | Freq: Three times a day (TID) | ORAL | 2 refills | Status: DC

## 2022-02-24 MED ORDER — HYDRALAZINE HCL 50 MG PO TABS: 50.0000 mg | ORAL_TABLET | Freq: Three times a day (TID) | ORAL | 2 refills | Status: DC

## 2022-02-24 NOTE — Progress Notes (Signed)
Primary Physician/Referring:  Lauree Chandler, NP  Patient ID: Mikayla [name], female    DOB: January 21, 1926, 88 y.o.   MRN: [id number]  Chief Complaint  Patient presents with   Coronary Artery Disease   Hypertension   Follow-up    4 weeks   HPI:    Mikayla [name]  is a 88 y.o.ffemale patient with hypertension, hypercholesterolemia, diabetes mellitus controlled on metformin, stage IIIa chronic kidney disease, chronic leg edema, chronic hyponatremia admitted to the hospital on 01/15/2022 with back pain and shoulder pain, EKG revealed inferior STEMI  underwent successful primary angioplasty to the dominant right coronary artery with very small hematoma involving the right forearm. She also had mild acute diastolic heart failure.    She has not had any further anginal episodes and she has not used any sublingual nitroglycerin.  States that her dyspnea has resolved, leg edema is very minimal and overall she is feeling well and states that she is back to her baseline.  Past Medical History:  Diagnosis Date   Chronic rhinitis    CKD (chronic kidney disease), stage III (HCC)    Disorder of bone and cartilage, unspecified    Essential hypertension, benign    Glaucoma    Per [facility] new patient packet   Hearing loss    Per [place] new patient packet   High blood pressure    Per [facility] new patient packet   High cholesterol    Per [place] new patient packet   Obesity, unspecified    Other and unspecified hyperlipidemia    Type II or unspecified type diabetes mellitus without mention of complication, not stated as uncontrolled    Vision loss    Due to catarct Per [place] new patient packet   Past Surgical History:  Procedure Laterality Date   CESAREAN SECTION N/A    CORONARY/GRAFT ACUTE MI REVASCULARIZATION N/A 01/15/2022   Procedure: Coronary/Graft Acute MI Revascularization;  Surgeon: Adrian Prows, MD;  Location: [facility];  Service: Cardiovascular;  Laterality: N/A;    ESOPHAGOGASTRODUODENOSCOPY N/A 04/29/2014   Procedure: ESOPHAGOGASTRODUODENOSCOPY (EGD);  Surgeon: Milus Banister, MD;  Location: [facility];  Service: Endoscopy;  Laterality: N/A;   LEFT HEART CATH AND CORONARY ANGIOGRAPHY N/A 01/15/2022   Procedure: LEFT HEART CATH AND CORONARY ANGIOGRAPHY;  Surgeon: Adrian Prows, MD;  Location: [facility];  Service: Cardiovascular;  Laterality: N/A;   TOTAL ABDOMINAL HYSTERECTOMY     Family History  Problem Relation Age of Onset   COPD Mother    Lung cancer Father    Multiple myeloma Son    Prostate cancer Other    Colon cancer Neg Hx    Esophageal cancer Neg Hx    Rectal cancer Neg Hx    Stomach cancer Neg Hx     Social History   Tobacco Use   Smoking status: Never   Smokeless tobacco: Never  Substance Use Topics   Alcohol use: Yes    Alcohol/week: 7.0 standard drinks of alcohol    Types: 7 Standard drinks or equivalent per week    Comment: wine with dinner   Marital Status: Married  ROS  Review of Systems  Cardiovascular:  Negative for chest pain, dyspnea on exertion and leg swelling.   Objective      02/24/2022    2:45 PM 02/24/2022    2:36 PM 01/25/2022   11:46 AM  Vitals with BMI  Height  4' 10"    Weight  122 lbs 13 oz  BMI  51.70   Systolic [phone]  Diastolic 61 55 49  Pulse 66 74 52   Today's Vitals   02/24/22 1436 02/24/22 1445  BP: (!) 164/55 (!) 166/61  Pulse: 74 66  Resp: 16   Temp: 98.6 F (37 C)   TempSrc: Temporal   SpO2: 98%   Weight: 122 lb 12.8 oz (55.7 kg)   Height: 4' 10"  (1.473 m)     Body mass index is 25.67 kg/m.  Physical Exam Neck:     Vascular: No carotid bruit or JVD.  Cardiovascular:     Rate and Rhythm: Normal rate and regular rhythm.     Pulses: Intact distal pulses.          Carotid pulses are  on the right side with bruit and  on the left side with bruit.    Heart sounds: Murmur heard.     Midsystolic murmur is present with a grade of 2/6 radiating to the apex.      No gallop.  Pulmonary:     Effort: Pulmonary effort is normal.     Breath sounds: Normal breath sounds.  Abdominal:     General: Bowel sounds are normal.     Palpations: Abdomen is soft.  Musculoskeletal:     Right lower leg: No edema.     Left lower leg: No edema.    Medications and allergies   Allergies  Allergen Reactions   Amoxil [Amoxicillin] Rash   Lipitor [Atorvastatin] Other (See Comments)    Myalgias    Motrin [Ibuprofen] Other (See Comments)    Told to avoid due to kidney function   Statins Other (See Comments)    Myalgias     Medication list after today's encounter   Current Outpatient Medications:    acetaminophen (TYLENOL) 500 MG tablet, Take 500 mg by mouth every 4 (four) hours as needed for mild pain or headache., Disp: , Rfl:    albuterol (VENTOLIN HFA) 108 (90 Base) MCG/ACT inhaler, Inhale 2 puffs into the lungs every 6 (six) hours as needed for wheezing or shortness of breath., Disp: 8 g, Rfl: 2   amLODipine (NORVASC) 10 MG tablet, TAKE 1 TABLET BY MOUTH DAILY (Patient taking differently: Take 10 mg by mouth daily.), Disp: 90 tablet, Rfl: 1   aspirin 81 MG tablet, Take 81 mg by mouth daily., Disp: , Rfl:    bisoprolol (ZEBETA) 5 MG tablet, Take 1 tablet (5 mg total) by mouth at bedtime., Disp: , Rfl:    budesonide-formoterol (SYMBICORT) 80-4.5 MCG/ACT inhaler, Inhale 2 puffs into the lungs 2 (two) times daily as needed (shortness of breath/wheezing)., Disp: 1 each, Rfl: 5   Cholecalciferol (VITAMIN D-3 PO), Take 1 capsule by mouth daily., Disp: , Rfl:    clopidogrel (PLAVIX) 75 MG tablet, Take 1 tablet (75 mg total) by mouth daily., Disp: 90 tablet, Rfl: 3   dorzolamide-timolol (COSOPT) 22.3-6.8 MG/ML ophthalmic solution, Place 1 drop into both eyes 2 (two) times daily. , Disp: , Rfl:    ezetimibe (ZETIA) 10 MG tablet, Take 1 tablet (10 mg total) by mouth daily., Disp: 30 tablet, Rfl: 2   famotidine (PEPCID) 20 MG tablet, Take 1 tablet (20 mg total) by mouth at  bedtime as needed for heartburn (COUGH FLARE)., Disp: , Rfl:    hydrALAZINE (APRESOLINE) 50 MG tablet, Take 1 tablet (50 mg total) by mouth 3 (three) times daily., Disp: 90 tablet, Rfl: 2   isosorbide dinitrate (ISORDIL) 30 MG tablet, Take  1 tablet (30 mg total) by mouth 3 (three) times daily., Disp: 90 tablet, Rfl: 2   levothyroxine (SYNTHROID) 50 MCG tablet, TAKE 1 TABLET BY MOUTH DAILY BEFORE BREAKFAST. (Patient taking differently: Take 50 mcg by mouth daily before breakfast.), Disp: 90 tablet, Rfl: 3   losartan (COZAAR) 25 MG tablet, Take 1 tablet (25 mg total) by mouth daily., Disp: 30 tablet, Rfl: 2   metFORMIN (GLUCOPHAGE) 500 MG tablet, Take 1 tablet (500 mg total) by mouth daily with breakfast., Disp: 180 tablet, Rfl: 0   methocarbamol (ROBAXIN) 500 MG tablet, Take 0.5 tablets (250 mg total) by mouth every 8 (eight) hours as needed for muscle spasms., Disp: 30 tablet, Rfl: 0   Multiple Vitamins-Minerals (CENTRUM SILVER PO), Take 1 tablet by mouth daily., Disp: , Rfl:    nitroGLYCERIN (NITROSTAT) 0.4 MG SL tablet, Place 1 tablet (0.4 mg total) under the tongue every 5 (five) minutes as needed for chest pain., Disp: 25 tablet, Rfl: 2  Laboratory examination:   Lab Results  Component Value Date   NA 137 01/25/2022   K 5.0 01/25/2022   CO2 20 01/25/2022   GLUCOSE 121 (H) 01/25/2022   BUN 54 (H) 01/25/2022   CREATININE 1.47 (H) 01/25/2022   CALCIUM 9.2 01/25/2022   EGFR 33 (L) 01/25/2022   GFRNONAA 38 (L) 01/16/2022       Latest Ref Rng & Units 01/25/2022   12:40 PM 01/16/2022    7:43 AM 01/15/2022   11:51 PM  BMP  Glucose 70 - 99 mg/dL 121  120  181   BUN 10 - 36 mg/dL 54  44  51   Creatinine 0.57 - 1.00 mg/dL 1.47  1.30  1.35   BUN/Creat Ratio 12 - 28 37     Sodium 134 - 144 mmol/L 137  130  128   Potassium 3.5 - 5.2 mmol/L 5.0  4.2  4.0   Chloride 96 - 106 mmol/L 102  98  97   CO2 20 - 29 mmol/L 20  21  21    Calcium 8.7 - 10.3 mg/dL 9.2  9.2  9.1       Latest Ref Rng &  Units 01/13/2022   11:33 AM 06/21/2021   10:35 AM 06/15/2020   10:53 AM  Hepatic Function  Total Protein 6.1 - 8.1 g/dL 7.4  7.1  6.8   Albumin 3.5 - 5.2 g/dL  4.1  4.0   AST 10 - 35 U/L 12  12  11    ALT 6 - 29 U/L 7  8  7    Alk Phosphatase 39 - 117 U/L  34  36   Total Bilirubin 0.2 - 1.2 mg/dL 0.4  0.5  0.4   Bilirubin, Direct 0.0 - 0.3 mg/dL  0.0  0.1     Lipid Panel Recent Labs    06/21/21 1035  CHOL 291*  TRIG 274.0*  VLDL 54.8*  HDL 44.00  CHOLHDL 7  LDLDIRECT 156.0   Lipoprotein (a) <75.0 nmol/L 38.3 High      HEMOGLOBIN A1C Lab Results  Component Value Date   HGBA1C 6.4 (H) 01/13/2022   MPG 137 01/13/2022   TSH Recent Labs    06/21/21 1035  TSH 1.93   BNP (last 3 results) Recent Labs    01/16/22 0743  BNP 639.2*   Radiology:   Chest x-ray 1 view 01/15/2022: Background of emphysema. No focal consolidation, pleural effusion, or pneumothorax. The cardiac silhouette is within limits. Atherosclerotic calcification of the aorta.  No acute osseous pathology. Osteopenia  Cardiac Studies:   Carotid artery duplex 07-17-19: Right Carotid: Velocities in the right ICA are consistent with a 1-39% stenosis. Non-hemodynamically significant plaque <50% noted in the CCA. Left Carotid: Velocities in the left ICA are consistent with a 60-79% stenosis. The ECA appears >50% stenosed. Unable to determine a more significant stenosis in the proximal ICA due to acoustic shadowing from plaque formation. Vertebrals:  Bilateral vertebral arteries demonstrate antegrade flow. Subclavians: Normal flow hemodynamics were seen in bilateral subclavian  arteries.    Echocardiogram 01/16/2022:  1. Left ventricular ejection fraction, by estimation, is 55 to 60%. The left ventricle has normal function. The left ventricle demonstrates regional wall motion abnormalities (see scoring diagram/findings for description). There is moderate left ventricular hypertrophy. Left ventricular diastolic  parameters are consistent with Grade II diastolic dysfunction (pseudonormalization). Elevated left ventricular end-diastolic pressure. The E/e' is 24.1. There is moderate hypokinesis of the left ventricular, basal-mid inferior wall.  2. Right ventricular systolic function is normal. The right ventricular size is normal. Mildly increased right ventricular wall thickness. Tricuspid regurgitation signal is inadequate for assessing PA pressure.  3. Left atrial size was mildly dilated.  4. The mitral valve is normal in structure. Moderate mitral valve regurgitation. No evidence of mitral stenosis.  5. The aortic valve is tricuspid. Aortic valve regurgitation is not visualized. Aortic valve sclerosis/calcification is present, without any evidence of aortic stenosis.  6. There is mild (Grade II) plaque.    Left Heart Catheterization 01/15/22:  LV not performed. LM: Calcified without luminal obstruction. LAD: Mild diffuse disease, focal 40% stenosis in the midsegment, mild to moderate diffuse calcification.  Small D1 and D2 and D3.  Faint collaterals are noted to the distal RCA. CX: Small calibered vessel.  Gives origin to a small caliber but long OM1 and OM 3.  Proximal to mid segment of Cx has a focal calcific 70% stenosis. RCA: Dominant vessel, gives origin to large PL and PDA.  Mildly diffusely calcified.  Occluder of the origin of RV branch. Intervention data: Successful PTCA and stenting of the mid RCA with implantation of a 3.5 x 16 mm Synergy XT DES at 12 atmospheric pressure, stenosis reduced from 100% to 0%, TIMI 0 to TIMI-3 flow.  65 mill contrast utilized.   Recommendation: Patient will be observed in the intensive care unit.  Cardiac rehab will be referred for.  She will need dual antiplatelet therapy for a year.  Medical therapy for CX disease.  Aggressive risk modification.      Carotid artery duplex 02/18/2022: Duplex suggests stenosis in the right internal carotid artery (1-15%).  Heterogeneous plaque.  Duplex suggests stenosis in the left internal carotid artery (50-69%). Calcific plaque. Doppler signal excellent.  Antegrade right vertebral artery flow. Antegrade left vertebral artery flow. Follow up in six months is appropriate if clinically indicated. No significant change from the carotid artery duplex done outside on 07-17-19.  EKG:   EKG 01/25/2022: Marked sinus bradycardia with first-degree AV block at rate of 48 bpm, left axis deviation, left anterior fascicular block.  Cannot exclude inferior infarct old.  T wave abnormality, cannot exclude inferior ischemia.  Normal QT interval.    EKG 01/16/2022: Sinus rhythm with first-degree AV block at rate of 65 bpm, inferior infarct old.  Possibly posterior infarct old.  Nonspecific T abnormality.  Compared to prior EKG 01/15/2022, inferior STEMI not present  Assessment     ICD-10-CM   1. Acute on chronic diastolic heart failure ([place])  I50.33     2. Coronary artery disease of native artery of native heart with stable angina pectoris ([place])  I25.118     3. Primary hypertension  I10 hydrALAZINE (APRESOLINE) 50 MG tablet    isosorbide dinitrate (ISORDIL) 30 MG tablet    4. Carotid stenosis, asymptomatic, left  I65.22 PCV CAROTID DUPLEX (BILATERAL)       No orders of the defined types were placed in this encounter.   Meds ordered this encounter  Medications   hydrALAZINE (APRESOLINE) 50 MG tablet    Sig: Take 1 tablet (50 mg total) by mouth 3 (three) times daily.    Dispense:  90 tablet    Refill:  2   isosorbide dinitrate (ISORDIL) 30 MG tablet    Sig: Take 1 tablet (30 mg total) by mouth 3 (three) times daily.    Dispense:  90 tablet    Refill:  2    Medications Discontinued During This Encounter  Medication Reason   simvastatin (ZOCOR) 20 MG tablet    torsemide (DEMADEX) 20 MG tablet    potassium chloride (KLOR-CON) 20 MEQ packet       Recommendations:   Mikayla [name] is a 88 y.o. female patient  with hypertension, hypercholesterolemia, diabetes mellitus controlled on metformin, stage IIIa chronic kidney disease, chronic leg edema, chronic hyponatremia admitted to the hospital on 01/15/2022 with back pain and shoulder pain, EKG revealed inferior STEMI  underwent successful primary angioplasty to the dominant right coronary artery with very small hematoma involving the right forearm. She also had mild acute diastolic heart failure.    She has not had any further anginal episodes and she has not used any sublingual nitroglycerin.  She is presently asymptomatic without dyspnea, chronic bilateral leg edema is very minimal.  She is now off of diuretics.  No clinical evidence of heart failure.  No gallop and lungs are clear.  Continue low-dose beta-blocker, she could not tolerate 10 mg of bisoprolol due to bradycardia.  Continue amlodipine, losartan 25 mg daily.  We will add hydralazine 50 mg 3 times daily along with isosorbide dinitrate 30 mg 3 times daily for blood pressure control.  Carotid artery duplex reviewed, she has moderate disease of the left ICA and mild disease on the right.  Although 88 years of age, I would like to repeat duplex in 6 months.  Patient lives independently, drives, and does not on house maintenance.   Reviewed her renal function, has remained stable.  She is presently on torsemide, she is severely hyponatremic.  BNP is clearly elevated and hence I suggest that we continue with diuretics on a daily basis, discussed with her regarding strict adherence to low-salt diet.  We would like to continue to trend her sodium levels as we go along.  Echocardiogram also suggested pseudonormal pattern.  She has asymptomatic bilateral carotid artery stenosis by previous duplex which has not had any further evaluation, will reestablish baseline.  Although 88 years of age, continues to live independently and is doing well.  She is accompanied by her daughter, all questions  answered.   Addendum: Hyponatremia persist on repeat BMP and BNP is elevated suggesting acute diastolic heart failure.  I have sent message to continue torsemide and potassium supplements daily and to restrict salt, repeat labs in 2 to 3 weeks for follow-up of hyponatremia and heart failure.   Adrian Prows, MD, [facility] 02/24/2022, [place] PM Office: [phone]

## 2022-03-01 ENCOUNTER — Telehealth: Payer: Self-pay

## 2022-03-01 NOTE — Telephone Encounter (Signed)
She can just start Hydralazine only. Do not start Isosorbide dinitrate and see how she does

## 2022-03-01 NOTE — Telephone Encounter (Signed)
Patient called and states that her medication that she was prescribed on 02/24/22 has caused her to have a bad reaction. The medications that was prescribed were Isosorbide Dinitrate '30mg'$  and Hydralazine 50 mg, Patient states that she started taking them On Monday of this week and she states that the reaction was Disorientation,  Cold sweats, and Dizziness.  Since having these reactions patient has stopped taking both medications and wanted to know if there was maybe something else she could take that wouldn't make her feel that way.  Please Advise

## 2022-03-02 NOTE — Telephone Encounter (Signed)
Spoke with patient and she gave a verbal understanding to the message above

## 2022-03-06 ENCOUNTER — Telehealth: Payer: Self-pay | Admitting: Cardiology

## 2022-03-06 ENCOUNTER — Other Ambulatory Visit: Payer: Self-pay | Admitting: Nurse Practitioner

## 2022-03-06 ENCOUNTER — Other Ambulatory Visit: Payer: Self-pay | Admitting: Internal Medicine

## 2022-03-06 DIAGNOSIS — E1122 Type 2 diabetes mellitus with diabetic chronic kidney disease: Secondary | ICD-10-CM

## 2022-03-06 NOTE — Telephone Encounter (Signed)
I called patient, she is saying that she is still having some dizziness and fatigue after taking Hydralazine. Per Mikayla [name], STOP the Hydralazine and when she comes in on Nov 9 to her appointment, he will discuss other options.

## 2022-03-06 NOTE — Telephone Encounter (Signed)
Patient says she was told to call our office today and speak with a medical assistant. She last spoke w/Dawn on 10/18 about a medication reaction. Please call her back.

## 2022-03-07 DIAGNOSIS — H353221 Exudative age-related macular degeneration, left eye, with active choroidal neovascularization: Secondary | ICD-10-CM | POA: Diagnosis not present

## 2022-03-23 ENCOUNTER — Ambulatory Visit: Payer: Medicare Other | Admitting: Cardiology

## 2022-03-28 ENCOUNTER — Ambulatory Visit: Payer: Medicare Other | Admitting: Cardiology

## 2022-03-28 ENCOUNTER — Encounter: Payer: Self-pay | Admitting: Cardiology

## 2022-03-28 ENCOUNTER — Ambulatory Visit (INDEPENDENT_AMBULATORY_CARE_PROVIDER_SITE_OTHER): Payer: Medicare Other | Admitting: *Deleted

## 2022-03-28 VITALS — BP 183/62 | HR 55 | Resp 14 | Ht <= 58 in | Wt 121.8 lb

## 2022-03-28 DIAGNOSIS — I25118 Atherosclerotic heart disease of native coronary artery with other forms of angina pectoris: Secondary | ICD-10-CM | POA: Diagnosis not present

## 2022-03-28 DIAGNOSIS — I129 Hypertensive chronic kidney disease with stage 1 through stage 4 chronic kidney disease, or unspecified chronic kidney disease: Secondary | ICD-10-CM | POA: Diagnosis not present

## 2022-03-28 DIAGNOSIS — I10 Essential (primary) hypertension: Secondary | ICD-10-CM

## 2022-03-28 DIAGNOSIS — E7800 Pure hypercholesterolemia, unspecified: Secondary | ICD-10-CM

## 2022-03-28 DIAGNOSIS — Z23 Encounter for immunization: Secondary | ICD-10-CM | POA: Diagnosis not present

## 2022-03-28 DIAGNOSIS — I2111 ST elevation (STEMI) myocardial infarction involving right coronary artery: Secondary | ICD-10-CM | POA: Diagnosis not present

## 2022-03-28 DIAGNOSIS — N1832 Chronic kidney disease, stage 3b: Secondary | ICD-10-CM | POA: Diagnosis not present

## 2022-03-28 MED ORDER — HYDRALAZINE HCL 50 MG PO TABS: 25.0000 mg | ORAL_TABLET | Freq: Three times a day (TID) | ORAL | 2 refills | Status: DC

## 2022-03-28 MED ORDER — ISOSORBIDE DINITRATE 30 MG PO TABS: 15.0000 mg | ORAL_TABLET | Freq: Three times a day (TID) | ORAL | 2 refills | Status: DC

## 2022-03-28 NOTE — Progress Notes (Signed)
Primary Physician/Referring:  Lauree Chandler, NP  Patient ID: Mikayla [name], female    DOB: Feb 03, 1926, 88 y.o.   MRN: [id number]  Chief Complaint  Patient presents with   Coronary Artery Disease   Hypertension   Follow-up    1 month   HPI:    Mikayla [name]  is a 88 y.o.ffemale patient with hypertension, hypercholesterolemia, diabetes mellitus controlled on metformin, stage IIIa-b chronic kidney disease,  chronic hyponatremia inferior STEMI on 01/15/2022 SB angioplasty to RCA, presents here for 6-week follow-up.   She also had mild acute diastolic heart failure. he is feeling well and states that she is back to her baseline.  Past Medical History:  Diagnosis Date   Chronic rhinitis    CKD (chronic kidney disease), stage III (HCC)    Disorder of bone and cartilage, unspecified    Essential hypertension, benign    Glaucoma    Per [facility] new patient packet   Hearing loss    Per [place] [facility] new patient packet   High blood pressure    Per [facility] new patient packet   High cholesterol    Per [facility] new patient packet   Obesity, unspecified    Other and unspecified hyperlipidemia    Type II or unspecified type diabetes mellitus without mention of complication, not stated as uncontrolled    Vision loss    Due to catarct Per [facility] new patient packet   Past Surgical History:  Procedure Laterality Date   CESAREAN SECTION N/A    CORONARY/GRAFT ACUTE MI REVASCULARIZATION N/A 01/15/2022   Procedure: Coronary/Graft Acute MI Revascularization;  Surgeon: Adrian Prows, MD;  Location: [facility];  Service: Cardiovascular;  Laterality: N/A;   ESOPHAGOGASTRODUODENOSCOPY N/A 04/29/2014   Procedure: ESOPHAGOGASTRODUODENOSCOPY (EGD);  Surgeon: Milus Banister, MD;  Location: [facility];  Service: Endoscopy;  Laterality: N/A;   LEFT HEART CATH AND CORONARY ANGIOGRAPHY N/A 01/15/2022   Procedure: LEFT HEART CATH AND CORONARY ANGIOGRAPHY;  Surgeon: Adrian Prows, MD;  Location: [facility];   Service: Cardiovascular;  Laterality: N/A;   TOTAL ABDOMINAL HYSTERECTOMY     Family History  Problem Relation Age of Onset   COPD Mother    Lung cancer Father    Multiple myeloma Son    Prostate cancer Other    Colon cancer Neg Hx    Esophageal cancer Neg Hx    Rectal cancer Neg Hx    Stomach cancer Neg Hx     Social History   Tobacco Use   Smoking status: Never   Smokeless tobacco: Never  Substance Use Topics   Alcohol use: Yes    Alcohol/week: 7.0 standard drinks of alcohol    Types: 7 Standard drinks or equivalent per week    Comment: wine with dinner   Marital Status: Married  ROS  Review of Systems  Cardiovascular:  Negative for chest pain, dyspnea on exertion and leg swelling.   Objective      03/28/2022   11:15 AM 03/28/2022   10:31 AM 02/24/2022    2:45 PM  Vitals with BMI  Height  _0    Weight  121 lbs 13 oz   BMI  26.33   Systolic 354 562 563  Diastolic 62 61 61  Pulse 55 64 66   Today's Vitals   03/28/22 1031 03/28/22 1115  BP: (!) 184/61 (!) 183/62  Pulse: 64 (!) 55  Resp: 14   SpO2: 100%   Weight: 121 lb 12.8 oz (  55.2 kg)   Height: _0  (1.473 m)     Body mass index is 25.46 kg/m.  Physical Exam Neck:     Vascular: No carotid bruit or JVD.  Cardiovascular:     Rate and Rhythm: Normal rate and regular rhythm.     Pulses: Intact distal pulses.          Carotid pulses are  on the right side with bruit and  on the left side with bruit.    Heart sounds: Murmur heard.     Midsystolic murmur is present with a grade of 2/6 radiating to the apex.     No gallop.  Pulmonary:     Effort: Pulmonary effort is normal.     Breath sounds: Normal breath sounds.  Abdominal:     General: Bowel sounds are normal.     Palpations: Abdomen is soft.  Musculoskeletal:     Right lower leg: No edema.     Left lower leg: No edema.    Medications and allergies   Allergies  Allergen Reactions   Amoxil [Amoxicillin] Rash   Isosorb  Dinitrate-Hydralazine Other (See Comments)    Severe dizziness, altered mental status   Lipitor [Atorvastatin] Other (See Comments)    Myalgias    Motrin [Ibuprofen] Other (See Comments)    Told to avoid due to kidney function   Statins Other (See Comments)    Myalgias     Medication list after today's encounter   Current Outpatient Medications:    acetaminophen (TYLENOL) 500 MG tablet, Take 500 mg by mouth every 4 (four) hours as needed for mild pain or headache., Disp: , Rfl:    albuterol (VENTOLIN HFA) 108 (90 Base) MCG/ACT inhaler, Inhale 2 puffs into the lungs every 6 (six) hours as needed for wheezing or shortness of breath., Disp: 8 g, Rfl: 2   amLODipine (NORVASC) 10 MG tablet, TAKE 1 TABLET BY MOUTH DAILY, Disp: 90 tablet, Rfl: 1   aspirin 81 MG tablet, Take 81 mg by mouth daily., Disp: , Rfl:    bisoprolol (ZEBETA) 5 MG tablet, Take 1 tablet (5 mg total) by mouth at bedtime., Disp: , Rfl:    budesonide-formoterol (SYMBICORT) 80-4.5 MCG/ACT inhaler, Inhale 2 puffs into the lungs 2 (two) times daily as needed (shortness of breath/wheezing)., Disp: 1 each, Rfl: 5   Cholecalciferol (VITAMIN D-3 PO), Take 1 capsule by mouth daily., Disp: , Rfl:    clopidogrel (PLAVIX) 75 MG tablet, Take 1 tablet (75 mg total) by mouth daily., Disp: 90 tablet, Rfl: 3   dorzolamide-timolol (COSOPT) 22.3-6.8 MG/ML ophthalmic solution, Place 1 drop into both eyes 2 (two) times daily. , Disp: , Rfl:    ezetimibe (ZETIA) 10 MG tablet, Take 1 tablet (10 mg total) by mouth daily., Disp: 30 tablet, Rfl: 2   famotidine (PEPCID) 20 MG tablet, Take 1 tablet (20 mg total) by mouth at bedtime as needed for heartburn (COUGH FLARE)., Disp: , Rfl:    levothyroxine (SYNTHROID) 50 MCG tablet, TAKE 1 TABLET BY MOUTH DAILY BEFORE BREAKFAST. (Patient taking differently: Take 50 mcg by mouth daily before breakfast.), Disp: 90 tablet, Rfl: 3   losartan (COZAAR) 25 MG tablet, Take 1 tablet (25 mg total) by mouth daily., Disp: 30  tablet, Rfl: 2   metFORMIN (GLUCOPHAGE) 500 MG tablet, Take 1 tablet (500 mg total) by mouth daily with breakfast., Disp: 90 tablet, Rfl: 1   methocarbamol (ROBAXIN) 500 MG tablet, Take 0.5 tablets (250 mg total) by mouth every  8 (eight) hours as needed for muscle spasms., Disp: 30 tablet, Rfl: 0   Multiple Vitamins-Minerals (CENTRUM SILVER PO), Take 1 tablet by mouth daily., Disp: , Rfl:    nitroGLYCERIN (NITROSTAT) 0.4 MG SL tablet, Place 1 tablet (0.4 mg total) under the tongue every 5 (five) minutes as needed for chest pain., Disp: 25 tablet, Rfl: 2   hydrALAZINE (APRESOLINE) 50 MG tablet, Take 0.5 tablets (25 mg total) by mouth 3 (three) times daily., Disp: 90 tablet, Rfl: 2   isosorbide dinitrate (ISORDIL) 30 MG tablet, Take 0.5 tablets (15 mg total) by mouth 3 (three) times daily., Disp: 90 tablet, Rfl: 2  Laboratory examination:   Lab Results  Component Value Date   NA 137 01/25/2022   K 5.0 01/25/2022   CO2 20 01/25/2022   GLUCOSE 121 (H) 01/25/2022   BUN 54 (H) 01/25/2022   CREATININE 1.47 (H) 01/25/2022   CALCIUM 9.2 01/25/2022   EGFR 33 (L) 01/25/2022   GFRNONAA 38 (L) 01/16/2022       Latest Ref Rng & Units 01/25/2022   12:40 PM 01/16/2022    7:43 AM 01/15/2022   11:51 PM  BMP  Glucose 70 - 99 mg/dL 121  120  181   BUN 10 - 36 mg/dL 54  44  51   Creatinine 0.57 - 1.00 mg/dL 1.47  1.30  1.35   BUN/Creat Ratio 12 - 28 37     Sodium 134 - 144 mmol/L 137  130  128   Potassium 3.5 - 5.2 mmol/L 5.0  4.2  4.0   Chloride 96 - 106 mmol/L 102  98  97   CO2 20 - 29 mmol/L _0 Calcium 8.7 - 10.3 mg/dL 9.2  9.2  9.1       Latest Ref Rng & Units 01/13/2022   11:33 AM 06/21/2021   10:35 AM 06/15/2020   10:53 AM  Hepatic Function  Total Protein 6.1 - 8.1 g/dL 7.4  7.1  6.8   Albumin 3.5 - 5.2 g/dL  4.1  4.0   AST 10 - 35 U/L _1 ALT 6 - 29 U/L _2 Alk Phosphatase 39 - 117 U/L  34  36   Total Bilirubin 0.2 - 1.2 mg/dL 0.4  0.5  0.4   Bilirubin, Direct 0.0 -  0.3 mg/dL  0.0  0.1     Lipid Panel Recent Labs    06/21/21 1035  CHOL 291*  TRIG 274.0*  VLDL 54.8*  HDL 44.00  CHOLHDL 7  LDLDIRECT 156.0   Lipoprotein (a) <75.0 nmol/L 38.3 High      HEMOGLOBIN A1C Lab Results  Component Value Date   HGBA1C 6.4 (H) 01/13/2022   MPG 137 01/13/2022   TSH Recent Labs    06/21/21 1035  TSH 1.93   BNP (last 3 results) Recent Labs    01/16/22 0743  BNP 639.2*   Radiology:   Chest x-ray 1 view 01/15/2022: Background of emphysema. No focal consolidation, pleural effusion, or pneumothorax. The cardiac silhouette is within limits. Atherosclerotic calcification of the aorta. No acute osseous pathology. Osteopenia  Cardiac Studies:   Carotid artery duplex Jul 06, 2019: Right Carotid: Velocities in the right ICA are consistent with a 1-39% stenosis. Non-hemodynamically significant plaque <50% noted in the CCA. Left Carotid: Velocities in the left ICA are consistent with a 60-79% stenosis. The ECA appears >50% stenosed. Unable to determine a more  significant stenosis in the proximal ICA due to acoustic shadowing from plaque formation. Vertebrals:  Bilateral vertebral arteries demonstrate antegrade flow. Subclavians: Normal flow hemodynamics were seen in bilateral subclavian  arteries.    Echocardiogram 01/16/2022:  1. Left ventricular ejection fraction, by estimation, is 55 to 60%. The left ventricle has normal function. The left ventricle demonstrates regional wall motion abnormalities (see scoring diagram/findings for description). There is moderate left ventricular hypertrophy. Left ventricular diastolic parameters are consistent with Grade II diastolic dysfunction (pseudonormalization). Elevated left ventricular end-diastolic pressure. The E/e' is 24.1. There is moderate hypokinesis of the left ventricular, basal-mid inferior wall.  2. Right ventricular systolic function is normal. The right ventricular size is normal. Mildly increased right  ventricular wall thickness. Tricuspid regurgitation signal is inadequate for assessing PA pressure.  3. Left atrial size was mildly dilated.  4. The mitral valve is normal in structure. Moderate mitral valve regurgitation. No evidence of mitral stenosis.  5. The aortic valve is tricuspid. Aortic valve regurgitation is not visualized. Aortic valve sclerosis/calcification is present, without any evidence of aortic stenosis.  6. There is mild (Grade II) plaque.    Left Heart Catheterization 01/15/22:  LV not performed. LM: Calcified without luminal obstruction. LAD: Mild diffuse disease, focal 40% stenosis in the midsegment, mild to moderate diffuse calcification.  Small D1 and D2 and D3.  Faint collaterals are noted to the distal RCA. CX: Small calibered vessel.  Gives origin to a small caliber but long OM1 and OM 3.  Proximal to mid segment of Cx has a focal calcific 70% stenosis. RCA: Dominant vessel, gives origin to large PL and PDA.  Mildly diffusely calcified.  Occluder of the origin of RV branch. Intervention data: Successful PTCA and stenting of the mid RCA with implantation of a 3.5 x 16 mm Synergy XT DES at 12 atmospheric pressure, stenosis reduced from 100% to 0%, TIMI 0 to TIMI-3 flow.  65 mill contrast utilized.   Recommendation: Patient will be observed in the intensive care unit.  Cardiac rehab will be referred for.  She will need dual antiplatelet therapy for a year.  Medical therapy for CX disease.  Aggressive risk modification.     01/15/2022: 3.5 x 16 mm Synergy XT   Carotid artery duplex 02/18/2022: Duplex suggests stenosis in the right internal carotid artery (1-15%). Heterogeneous plaque.  Duplex suggests stenosis in the left internal carotid artery (50-69%). Calcific plaque. Doppler signal excellent.  Antegrade right vertebral artery flow. Antegrade left vertebral artery flow. Follow up in six months is appropriate if clinically indicated. No significant change from the  carotid artery duplex done outside on 06/18/2019.  EKG:   EKG 01/25/2022: Marked sinus bradycardia with first-degree AV block at rate of 48 bpm, left axis deviation, left anterior fascicular block.  Cannot exclude inferior infarct old.  T wave abnormality, cannot exclude inferior ischemia.  Normal QT interval.    EKG 01/16/2022: Sinus rhythm with first-degree AV block at rate of 65 bpm, inferior infarct old.  Possibly posterior infarct old.  Nonspecific T abnormality.  Compared to prior EKG 01/15/2022, inferior STEMI not present  Assessment     ICD-10-CM   1. Coronary artery disease of native artery of native heart with stable angina pectoris ([place])  I25.118     2. Primary hypertension  I10 isosorbide dinitrate (ISORDIL) 30 MG tablet    hydrALAZINE (APRESOLINE) 50 MG tablet    3. Stage 3b chronic kidney disease (HCC)  N18.32     4. White  coat syndrome with diagnosis of hypertension  I10     5. Hypercholesteremia  E78.00        No orders of the defined types were placed in this encounter.   Meds ordered this encounter  Medications   isosorbide dinitrate (ISORDIL) 30 MG tablet    Sig: Take 0.5 tablets (15 mg total) by mouth 3 (three) times daily.    Dispense:  90 tablet    Refill:  2   hydrALAZINE (APRESOLINE) 50 MG tablet    Sig: Take 0.5 tablets (25 mg total) by mouth 3 (three) times daily.    Dispense:  90 tablet    Refill:  2    Medications Discontinued During This Encounter  Medication Reason   hydrALAZINE (APRESOLINE) 50 MG tablet    isosorbide dinitrate (ISORDIL) 30 MG tablet       Recommendations:   Mikayla [name] is a 88 y.o. female patient with hypertension, hypercholesterolemia, diabetes mellitus controlled on metformin, stage IIIa-b chronic kidney disease, chronic hyponatremia inferior STEMI on 01/15/2022 SB angioplasty to RCA, presents here for 6-week follow-up.   She also had mild acute diastolic heart failure.    1. Coronary artery disease of native artery of  native heart with stable angina pectoris ([place]) From coronary disease standpoint she has remained stable without recurrence of angina pectoris.  2. Primary hypertension Blood pressure remains elevated, she was tried on BiDil in the past with isosorbide dinitrate and hydralazine separately and she had developed significant dizziness and also did not like the side effect of the medication, but she is willing to try this again.  We will start her on hydralazine 50 mg 1/2 tablet 3 times daily along with isosorbide dinitrate 30 mg 1/2 tablet 3 times daily.  I would like to see her back in 4 weeks for follow-up of hypertension.  3. Stage 3b chronic kidney disease ([place]) Kidney disease is remained stable.  Hyponatremia has resolved.  4. White coat syndrome with diagnosis of hypertension Patient blood pressure at home is much improved than compared to office visits however she has not been recording this recently.  5. Hypercholesteremia Patient is presently only on Zetia, does not want to be on any injectables or on any statin therapy.    Adrian Prows, MD, [facility] 03/28/2022, 11:41 AM Office: [phone]

## 2022-04-03 NOTE — Telephone Encounter (Signed)
Patients calling asked about Hydralazine/isosorbide.she is taking half three times a day, she said she is very tired and fatigued. Patient asked if she can asked if she can only take twice daily due to side effects. Bottom number hasn't gone above 54 , pulse is 59-62. Her bp top has been between 129-135

## 2022-04-04 ENCOUNTER — Telehealth: Payer: Self-pay

## 2022-04-04 NOTE — Telephone Encounter (Signed)
Called patient, NA, LMAM

## 2022-04-04 NOTE — Telephone Encounter (Signed)
Patient calling inquiring about medication dosage. She didn't want to go into detail with me, but I do see there is a previous note with more information. She was frustrated and stated that we were inefficient, and that she shouldn't have to wait all day for a response.  Please advise.

## 2022-04-04 NOTE — Telephone Encounter (Signed)
I am fine with the change she is suggesting

## 2022-04-04 NOTE — Telephone Encounter (Signed)
Patient called and stated that she is taking 2 BP medications. : Isosorbide and Hydralazine. Patient was taking 1 full tablet of each TID and felt bad and was advised to [place] and take them TID. She stated that after taking the 2nd -1/2  tablet of each in the day, making her  feel weak and has pain in her legs, so a result, she has not been taking it at night. Patient is wanting to know if she can take them JUST in the morning and at night, instead of three times a day?  So to break it down , she wants to take Isosorbide 1/2 tablet - Hydralazine 1/2 tablet in the morning and Isosorbide 1/2 tablet and Hydralazine 1/2 tablet in the evening. Please advise.

## 2022-04-05 NOTE — Telephone Encounter (Signed)
Called and spoke with patient.

## 2022-04-11 DIAGNOSIS — H353221 Exudative age-related macular degeneration, left eye, with active choroidal neovascularization: Secondary | ICD-10-CM | POA: Diagnosis not present

## 2022-04-13 ENCOUNTER — Other Ambulatory Visit: Payer: Self-pay | Admitting: Cardiology

## 2022-04-25 ENCOUNTER — Encounter: Payer: Self-pay | Admitting: Cardiology

## 2022-04-25 ENCOUNTER — Ambulatory Visit: Payer: Medicare Other | Admitting: Cardiology

## 2022-04-25 VITALS — BP 130/56 | HR 64 | Resp 15 | Ht <= 58 in | Wt 123.4 lb

## 2022-04-25 DIAGNOSIS — D500 Iron deficiency anemia secondary to blood loss (chronic): Secondary | ICD-10-CM

## 2022-04-25 DIAGNOSIS — I10 Essential (primary) hypertension: Secondary | ICD-10-CM | POA: Diagnosis not present

## 2022-04-25 DIAGNOSIS — I25118 Atherosclerotic heart disease of native coronary artery with other forms of angina pectoris: Secondary | ICD-10-CM | POA: Diagnosis not present

## 2022-04-25 DIAGNOSIS — I5032 Chronic diastolic (congestive) heart failure: Secondary | ICD-10-CM | POA: Diagnosis not present

## 2022-04-25 NOTE — Progress Notes (Signed)
Primary Physician/Referring:  Lauree Chandler, NP  Patient ID: Mikayla [name], female    DOB: 01-Sep-1925, 88 y.o.   MRN: [id number]  Chief Complaint  Patient presents with   Coronary Artery Disease   Hypertension   HPI:    Mikayla [name]  is a 88 y.o. female patient with hypertension, hypercholesterolemia, diabetes mellitus controlled on metformin, stage IIIa-b chronic kidney disease, chronic hyponatremia inferior STEMI on 01/15/2022 SB angioplasty to RCA, chronic diastolic heart failure presents here for 6-week follow-up.     She is presently doing well, has occasional episodes of dyspnea but no PND or orthopnea.  No leg edema.  Past Medical History:  Diagnosis Date   Chronic rhinitis    CKD (chronic kidney disease), stage III (HCC)    Disorder of bone and cartilage, unspecified    Essential hypertension, benign    Glaucoma    Per [facility] new patient packet   Hearing loss    Per [facility] [place] new patient packet   High blood pressure    Per [facility] new patient packet   High cholesterol    Per [place] new patient packet   Obesity, unspecified    Other and unspecified hyperlipidemia    Type II or unspecified type diabetes mellitus without mention of complication, not stated as uncontrolled    Vision loss    Due to catarct Per [facility] new patient packet   Past Surgical History:  Procedure Laterality Date   CESAREAN SECTION N/A    CORONARY/GRAFT ACUTE MI REVASCULARIZATION N/A 01/15/2022   Procedure: Coronary/Graft Acute MI Revascularization;  Surgeon: Adrian Prows, MD;  Location: [facility];  Service: Cardiovascular;  Laterality: N/A;   ESOPHAGOGASTRODUODENOSCOPY N/A 04/29/2014   Procedure: ESOPHAGOGASTRODUODENOSCOPY (EGD);  Surgeon: Milus Banister, MD;  Location: [facility];  Service: Endoscopy;  Laterality: N/A;   LEFT HEART CATH AND CORONARY ANGIOGRAPHY N/A 01/15/2022   Procedure: LEFT HEART CATH AND CORONARY ANGIOGRAPHY;  Surgeon: Adrian Prows, MD;  Location: [facility];   Service: Cardiovascular;  Laterality: N/A;   TOTAL ABDOMINAL HYSTERECTOMY     Family History  Problem Relation Age of Onset   COPD Mother    Lung cancer Father    Multiple myeloma Son    Prostate cancer Other    Colon cancer Neg Hx    Esophageal cancer Neg Hx    Rectal cancer Neg Hx    Stomach cancer Neg Hx     Social History   Tobacco Use   Smoking status: Never   Smokeless tobacco: Never  Substance Use Topics   Alcohol use: Yes    Alcohol/week: 7.0 standard drinks of alcohol    Types: 7 Standard drinks or equivalent per week    Comment: wine with dinner   Marital Status: Married  ROS  Review of Systems  Cardiovascular:  Positive for dyspnea on exertion. Negative for chest pain and leg swelling.  Respiratory:  Positive for wheezing.    Objective      04/25/2022   12:01 PM 04/25/2022   11:03 AM 03/28/2022   11:15 AM  Vitals with BMI  Height  _0    Weight  123 lbs 6 oz   BMI  37.3   Systolic 428 768 115  Diastolic 56 56 62  Pulse  64 55   Today's Vitals   04/25/22 1103 04/25/22 1201  BP: (!) 150/56 (!) 130/56  Pulse: 64   Resp: 15   SpO2: 95%   Weight: 123  lb 6.4 oz (56 kg)   Height: _0  (1.473 m)     Body mass index is 25.79 kg/m.  Physical Exam Neck:     Vascular: No carotid bruit or JVD.  Cardiovascular:     Rate and Rhythm: Normal rate and regular rhythm.     Pulses: Intact distal pulses.          Carotid pulses are  on the right side with bruit and  on the left side with bruit.    Heart sounds: Murmur heard.     Midsystolic murmur is present with a grade of 2/6 radiating to the apex.     No gallop.  Pulmonary:     Effort: Pulmonary effort is normal.     Breath sounds: Normal breath sounds.  Abdominal:     General: Bowel sounds are normal.     Palpations: Abdomen is soft.  Musculoskeletal:     Right lower leg: No edema.     Left lower leg: No edema.    Medications and allergies   Allergies  Allergen Reactions   Amoxil  [Amoxicillin] Rash   Isosorb Dinitrate-Hydralazine Other (See Comments)    Severe dizziness, altered mental status   Lipitor [Atorvastatin] Other (See Comments)    Myalgias    Motrin [Ibuprofen] Other (See Comments)    Told to avoid due to kidney function   Statins Other (See Comments)    Myalgias     Medication list after today's encounter   Current Outpatient Medications:    acetaminophen (TYLENOL) 500 MG tablet, Take 500 mg by mouth every 4 (four) hours as needed for mild pain or headache., Disp: , Rfl:    albuterol (VENTOLIN HFA) 108 (90 Base) MCG/ACT inhaler, Inhale 2 puffs into the lungs every 6 (six) hours as needed for wheezing or shortness of breath., Disp: 8 g, Rfl: 2   amLODipine (NORVASC) 10 MG tablet, TAKE 1 TABLET BY MOUTH DAILY, Disp: 90 tablet, Rfl: 1   aspirin 81 MG tablet, Take 81 mg by mouth daily., Disp: , Rfl:    bisoprolol (ZEBETA) 5 MG tablet, Take 1 tablet (5 mg total) by mouth at bedtime., Disp: , Rfl:    budesonide-formoterol (SYMBICORT) 80-4.5 MCG/ACT inhaler, Inhale 2 puffs into the lungs 2 (two) times daily as needed (shortness of breath/wheezing)., Disp: 1 each, Rfl: 5   Cholecalciferol (VITAMIN D-3 PO), Take 1 capsule by mouth daily., Disp: , Rfl:    clopidogrel (PLAVIX) 75 MG tablet, Take 1 tablet (75 mg total) by mouth daily., Disp: 90 tablet, Rfl: 3   dorzolamide-timolol (COSOPT) 22.3-6.8 MG/ML ophthalmic solution, Place 1 drop into both eyes 2 (two) times daily. , Disp: , Rfl:    ezetimibe (ZETIA) 10 MG tablet, TAKE 1 TABLET BY MOUTH DAILY, Disp: 90 tablet, Rfl: 1   famotidine (PEPCID) 20 MG tablet, Take 1 tablet (20 mg total) by mouth at bedtime as needed for heartburn (COUGH FLARE)., Disp: , Rfl:    hydrALAZINE (APRESOLINE) 50 MG tablet, Take 0.5 tablets (25 mg total) by mouth 3 (three) times daily., Disp: 90 tablet, Rfl: 2   isosorbide dinitrate (ISORDIL) 30 MG tablet, Take 0.5 tablets (15 mg total) by mouth 3 (three) times daily., Disp: 90 tablet, Rfl:  2   levothyroxine (SYNTHROID) 50 MCG tablet, TAKE 1 TABLET BY MOUTH DAILY BEFORE BREAKFAST. (Patient taking differently: Take 50 mcg by mouth daily before breakfast.), Disp: 90 tablet, Rfl: 3   losartan (COZAAR) 25 MG tablet, TAKE 1 TABLET BY  MOUTH DAILY, Disp: 90 tablet, Rfl: 1   metFORMIN (GLUCOPHAGE) 500 MG tablet, Take 1 tablet (500 mg total) by mouth daily with breakfast., Disp: 90 tablet, Rfl: 1   Multiple Vitamins-Minerals (CENTRUM SILVER PO), Take 1 tablet by mouth daily., Disp: , Rfl:    nitroGLYCERIN (NITROSTAT) 0.4 MG SL tablet, Place 1 tablet (0.4 mg total) under the tongue every 5 (five) minutes as needed for chest pain., Disp: 25 tablet, Rfl: 2  Laboratory examination:   Lab Results  Component Value Date   NA 137 01/25/2022   K 5.0 01/25/2022   CO2 20 01/25/2022   GLUCOSE 121 (H) 01/25/2022   BUN 54 (H) 01/25/2022   CREATININE 1.47 (H) 01/25/2022   CALCIUM 9.2 01/25/2022   EGFR 33 (L) 01/25/2022   GFRNONAA 38 (L) 01/16/2022       Latest Ref Rng & Units 01/25/2022   12:40 PM 01/16/2022    7:43 AM 01/15/2022   11:51 PM  BMP  Glucose 70 - 99 mg/dL 121  120  181   BUN 10 - 36 mg/dL 54  44  51   Creatinine 0.57 - 1.00 mg/dL 1.47  1.30  1.35   BUN/Creat Ratio 12 - 28 37     Sodium 134 - 144 mmol/L 137  130  128   Potassium 3.5 - 5.2 mmol/L 5.0  4.2  4.0   Chloride 96 - 106 mmol/L 102  98  97   CO2 20 - 29 mmol/L _0 Calcium 8.7 - 10.3 mg/dL 9.2  9.2  9.1       Latest Ref Rng & Units 01/13/2022   11:33 AM 06/21/2021   10:35 AM 06/15/2020   10:53 AM  Hepatic Function  Total Protein 6.1 - 8.1 g/dL 7.4  7.1  6.8   Albumin 3.5 - 5.2 g/dL  4.1  4.0   AST 10 - 35 U/L _1 ALT 6 - 29 U/L _2 Alk Phosphatase 39 - 117 U/L  34  36   Total Bilirubin 0.2 - 1.2 mg/dL 0.4  0.5  0.4   Bilirubin, Direct 0.0 - 0.3 mg/dL  0.0  0.1     Lipid Panel Recent Labs    06/21/21 1035  CHOL 291*  TRIG 274.0*  VLDL 54.8*  HDL 44.00  CHOLHDL 7  LDLDIRECT 156.0    Lipoprotein (a) <75.0 nmol/L 38.3 High      HEMOGLOBIN A1C Lab Results  Component Value Date   HGBA1C 6.4 (H) 01/13/2022   MPG 137 01/13/2022   TSH Recent Labs    06/21/21 1035  TSH 1.93   BNP (last 3 results) Recent Labs    01/16/22 0743  BNP 639.2*   Radiology:   Chest x-ray 1 view 01/15/2022: Background of emphysema. No focal consolidation, pleural effusion, or pneumothorax. The cardiac silhouette is within limits. Atherosclerotic calcification of the aorta. No acute osseous pathology. Osteopenia  Cardiac Studies:   Carotid artery duplex 2019/07/13: Right Carotid: Velocities in the right ICA are consistent with a 1-39% stenosis. Non-hemodynamically significant plaque <50% noted in the CCA. Left Carotid: Velocities in the left ICA are consistent with a 60-79% stenosis. The ECA appears >50% stenosed. Unable to determine a more significant stenosis in the proximal ICA due to acoustic shadowing from plaque formation. Vertebrals:  Bilateral vertebral arteries demonstrate antegrade flow. Subclavians: Normal flow hemodynamics were seen in bilateral subclavian  arteries.  Echocardiogram 01/16/2022:  1. Left ventricular ejection fraction, by estimation, is 55 to 60%. The left ventricle has normal function. The left ventricle demonstrates regional wall motion abnormalities (see scoring diagram/findings for description). There is moderate left ventricular hypertrophy. Left ventricular diastolic parameters are consistent with Grade II diastolic dysfunction (pseudonormalization). Elevated left ventricular end-diastolic pressure. The E/e' is 24.1. There is moderate hypokinesis of the left ventricular, basal-mid inferior wall.  2. Right ventricular systolic function is normal. The right ventricular size is normal. Mildly increased right ventricular wall thickness. Tricuspid regurgitation signal is inadequate for assessing PA pressure.  3. Left atrial size was mildly dilated.  4. The  mitral valve is normal in structure. Moderate mitral valve regurgitation. No evidence of mitral stenosis.  5. The aortic valve is tricuspid. Aortic valve regurgitation is not visualized. Aortic valve sclerosis/calcification is present, without any evidence of aortic stenosis.  6. There is mild (Grade II) plaque.    Left Heart Catheterization 01/15/22:  LV not performed. LM: Calcified without luminal obstruction. LAD: Mild diffuse disease, focal 40% stenosis in the midsegment, mild to moderate diffuse calcification.  Small D1 and D2 and D3.  Faint collaterals are noted to the distal RCA. CX: Small calibered vessel.  Gives origin to a small caliber but long OM1 and OM 3.  Proximal to mid segment of Cx has a focal calcific 70% stenosis. RCA: Dominant vessel, gives origin to large PL and PDA.  Mildly diffusely calcified.  Occluder of the origin of RV branch. Intervention data: Successful PTCA and stenting of the mid RCA with implantation of a 3.5 x 16 mm Synergy XT DES at 12 atmospheric pressure, stenosis reduced from 100% to 0%, TIMI 0 to TIMI-3 flow.  65 mill contrast utilized.   Recommendation: Patient will be observed in the intensive care unit.  Cardiac rehab will be referred for.  She will need dual antiplatelet therapy for a year.  Medical therapy for CX disease.  Aggressive risk modification.     01/15/2022: 3.5 x 16 mm Synergy XT   Carotid artery duplex 02/18/2022: Duplex suggests stenosis in the right internal carotid artery (1-15%). Heterogeneous plaque.  Duplex suggests stenosis in the left internal carotid artery (50-69%). Calcific plaque. Doppler signal excellent.  Antegrade right vertebral artery flow. Antegrade left vertebral artery flow. Follow up in six months is appropriate if clinically indicated. No significant change from the carotid artery duplex done outside on 06/18/2019.  EKG:   EKG 01/25/2022: Marked sinus bradycardia with first-degree AV block at rate of 48 bpm, left  axis deviation, left anterior fascicular block.  Cannot exclude inferior infarct old.  T wave abnormality, cannot exclude inferior ischemia.  Normal QT interval.    EKG 01/16/2022: Sinus rhythm with first-degree AV block at rate of 65 bpm, inferior infarct old.  Possibly posterior infarct old.  Nonspecific T abnormality.  Compared to prior EKG 01/15/2022, inferior STEMI not present  Assessment     ICD-10-CM   1. Coronary artery disease of native artery of native heart with stable angina pectoris (HCC)  I25.118 CBC    Basic metabolic panel    2. Primary hypertension  I10     3. White coat syndrome with diagnosis of hypertension  I10     4. Chronic diastolic heart failure (HCC)  I50.32 Pro b natriuretic peptide (BNP)       Orders Placed This Encounter  Procedures   CBC   Basic metabolic panel   Pro b natriuretic peptide (BNP)  No orders of the defined types were placed in this encounter.   Medications Discontinued During This Encounter  Medication Reason   methocarbamol (ROBAXIN) 500 MG tablet Patient Preference      Recommendations:   PEGEEN STIGER is a 88 y.o. female patient with hypertension, hypercholesterolemia, diabetes mellitus controlled on metformin, stage IIIa-b chronic kidney disease, chronic hyponatremia inferior STEMI on 01/15/2022 SB angioplasty to RCA, chronic diastolic heart failure presents here for 6-week follow-up.     1. Coronary artery disease of native artery of native heart with stable angina pectoris [facility]) She has not had any recurrence of angina pectoris.  She is presently doing well, continue present medication.  2. Primary hypertension Blood pressure is now significantly improved since being on isosorbide dinitrate and also hydralazine, even at reduced dose, blood pressure under excellent control.  3. White coat syndrome with diagnosis of hypertension Blood pressure today was elevated upon presentation but improved after sitting for a  while.  4. Chronic diastolic heart failure ([place]) She has mild chronic dyspnea, no clinical evidence of heart failure, along with obtaining renal function study to follow-up on her renal insufficiency, I will also obtain NT proBNP.  5. Hypercholesteremia Patient is presently only on Zetia, does not want to be on any injectables or on any statin therapy.    Adrian Prows, MD, [facility] 04/25/2022, 12:11 PM Office: [phone]

## 2022-04-26 LAB — CBC
Hematocrit: 29.4 % — ABNORMAL LOW (ref 34.0–46.6)
Hemoglobin: 9.4 g/dL — ABNORMAL LOW (ref 11.1–15.9)
MCH: 29.6 pg (ref 26.6–33.0)
MCHC: 32 g/dL (ref 31.5–35.7)
MCV: 93 fL (ref 79–97)
Platelets: 202 10*3/uL (ref 150–450)
RBC: 3.18 x10E6/uL — ABNORMAL LOW (ref 3.77–5.28)
RDW: 12.9 % (ref 11.7–15.4)
WBC: 9.7 10*3/uL (ref 3.4–10.8)

## 2022-04-26 LAB — BASIC METABOLIC PANEL
BUN/Creatinine Ratio: 28 (ref 12–28)
BUN: 36 mg/dL (ref 10–36)
CO2: 19 mmol/L — ABNORMAL LOW (ref 20–29)
Calcium: 9.3 mg/dL (ref 8.7–10.3)
Chloride: 106 mmol/L (ref 96–106)
Creatinine, Ser: 1.29 mg/dL — ABNORMAL HIGH (ref 0.57–1.00)
Glucose: 127 mg/dL — ABNORMAL HIGH (ref 70–99)
Potassium: 5.2 mmol/L (ref 3.5–5.2)
Sodium: 137 mmol/L (ref 134–144)
eGFR: 38 mL/min/{1.73_m2} — ABNORMAL LOW (ref >59)

## 2022-04-26 LAB — PRO B NATRIURETIC PEPTIDE: NT-Pro BNP: 4724 pg/mL — ABNORMAL HIGH (ref 0–738)

## 2022-04-26 MED ORDER — FUROSEMIDE 40 MG PO TABS: 40.0000 mg | ORAL_TABLET | ORAL | 1 refills | Status: DC

## 2022-04-26 MED ORDER — POTASSIUM CHLORIDE ER 10 MEQ PO TBCR: 10.0000 meq | EXTENDED_RELEASE_TABLET | ORAL | 1 refills | Status: DC

## 2022-04-26 NOTE — Progress Notes (Signed)
Spoke with patient and daughter and they only had one other question. IF, she is NOT having any SOB, does she still need to take Lasix and 2 Potassium e/o day? Please advise.

## 2022-04-26 NOTE — Addendum Note (Signed)
Addended by: Kela Millin on: 04/26/2022 06:56 AM   Modules accepted: Orders

## 2022-04-26 NOTE — Addendum Note (Signed)
Addended by: Kela Millin on: 04/26/2022 07:10 AM   Modules accepted: Orders

## 2022-04-26 NOTE — Progress Notes (Signed)
Please let her know that her blood count was mildly down and she should go to the lab and collect stool card to check for blood in stool. SHe was c/o upper abdominal pain. Order placed.  She should start 1 Furosemide tab and 2 potassium pills every other day for shortness of breath and to be careful with diet during holidays as labs suggested excess fluid.   If she gets very short of breath, can take furosemide and potassium daily for 3 days then every other day.

## 2022-04-26 NOTE — Progress Notes (Signed)
Called patient, Mikayla [name], LMAM  Per JG, IF she is not having any SOB, she can skip taking it on that day.

## 2022-04-28 NOTE — Progress Notes (Signed)
Patient and daughter are aware.

## 2022-05-10 ENCOUNTER — Other Ambulatory Visit: Payer: Self-pay | Admitting: Internal Medicine

## 2022-05-11 ENCOUNTER — Ambulatory Visit (INDEPENDENT_AMBULATORY_CARE_PROVIDER_SITE_OTHER): Payer: Medicare Other | Admitting: Podiatry

## 2022-05-11 DIAGNOSIS — H26493 Other secondary cataract, bilateral: Secondary | ICD-10-CM | POA: Diagnosis not present

## 2022-05-11 DIAGNOSIS — M79675 Pain in left toe(s): Secondary | ICD-10-CM | POA: Diagnosis not present

## 2022-05-11 DIAGNOSIS — H401131 Primary open-angle glaucoma, bilateral, mild stage: Secondary | ICD-10-CM | POA: Diagnosis not present

## 2022-05-11 DIAGNOSIS — E1169 Type 2 diabetes mellitus with other specified complication: Secondary | ICD-10-CM | POA: Diagnosis not present

## 2022-05-11 DIAGNOSIS — M79674 Pain in right toe(s): Secondary | ICD-10-CM | POA: Diagnosis not present

## 2022-05-11 DIAGNOSIS — E669 Obesity, unspecified: Secondary | ICD-10-CM

## 2022-05-11 DIAGNOSIS — L84 Corns and callosities: Secondary | ICD-10-CM | POA: Diagnosis not present

## 2022-05-11 DIAGNOSIS — B351 Tinea unguium: Secondary | ICD-10-CM

## 2022-05-11 DIAGNOSIS — Z961 Presence of intraocular lens: Secondary | ICD-10-CM | POA: Diagnosis not present

## 2022-05-11 NOTE — Progress Notes (Signed)
  Subjective:  Patient ID: Mikayla [name], female    DOB: 01-10-1926,  MRN: [id number]  Chief Complaint  Patient presents with   Nail Problem    Thick painful toenails, 3 month follow up    88 y.o. female presents with the above complaint. History confirmed with patient.  Last debridement was very helpful.  No new problems.  Objective:  Physical Exam: warm, good capillary refill, no trophic changes or ulcerative lesions, normal DP and PT pulses, normal monofilament exam, normal sensory exam, and varicose veins were noted. Left Foot: dystrophic yellowed discolored nail plates with subungual debris and submetatarsal 1 and 5 hyperkeratotic lesion tender to touch Right Foot: dystrophic yellowed discolored nail plates with subungual debris, submetatarsal 5 hyperkeratotic lesion tender to touch Assessment:   1. Pain due to onychomycosis of toenails of both feet   2. Callus of foot   3. Diabetes mellitus type 2 in obese (HCC) [E11.69, E66.9]      Plan:  Patient was evaluated and treated and all questions answered.  Patient educated on diabetes. Discussed proper diabetic foot care and discussed risks and complications of disease. Educated patient in depth on reasons to return to the office immediately should he/she discover anything concerning or new on the feet. All questions answered. Discussed proper shoes as well.   Discussed the etiology and treatment options for the condition in detail with the patient. Educated patient on the topical and oral treatment options for mycotic nails. Recommended debridement of the nails today. Sharp and mechanical debridement performed of all painful and mycotic nails today. Nails debrided in length and thickness using a nail nipper to level of comfort. Discussed treatment options including appropriate shoe gear. Follow up as needed for painful nails.   All symptomatic hyperkeratoses were safely debrided with a sterile #15 blade to patient's level of  comfort without incident. We discussed preventative and palliative care of these lesions including supportive and accommodative shoegear, padding, prefabricated and custom molded accommodative orthoses, use of a pumice stone and lotions/creams daily.   Return in about 12 weeks (around 08/03/2022) for at risk diabetic foot care.

## 2022-05-23 DIAGNOSIS — H353221 Exudative age-related macular degeneration, left eye, with active choroidal neovascularization: Secondary | ICD-10-CM | POA: Diagnosis not present

## 2022-07-07 DIAGNOSIS — H2000 Unspecified acute and subacute iridocyclitis: Secondary | ICD-10-CM | POA: Diagnosis not present

## 2022-07-12 ENCOUNTER — Telehealth (INDEPENDENT_AMBULATORY_CARE_PROVIDER_SITE_OTHER): Payer: Medicare Other | Admitting: Student

## 2022-07-12 ENCOUNTER — Telehealth: Payer: Self-pay

## 2022-07-12 ENCOUNTER — Encounter: Payer: Self-pay | Admitting: Student

## 2022-07-12 ENCOUNTER — Telehealth: Payer: Self-pay | Admitting: *Deleted

## 2022-07-12 DIAGNOSIS — R062 Wheezing: Secondary | ICD-10-CM | POA: Diagnosis not present

## 2022-07-12 DIAGNOSIS — R0609 Other forms of dyspnea: Secondary | ICD-10-CM

## 2022-07-12 DIAGNOSIS — B349 Viral infection, unspecified: Secondary | ICD-10-CM | POA: Diagnosis not present

## 2022-07-12 MED ORDER — PREDNISONE 20 MG PO TABS: 40.0000 mg | ORAL_TABLET | Freq: Every day | ORAL | 0 refills | Status: AC

## 2022-07-12 NOTE — Progress Notes (Signed)
   This service is provided via telemedicine  No vital signs collected/recorded due to the encounter was a telemedicine visit.   Location of patient (ex: home, work):  Home  Patient consents to a telephone visit:  Yes  Location of the provider (ex: office, home):  [place].   Name of any referring provider:  na  Names of all persons participating in the telemedicine service and their role in the encounter:  Dimas Aguas Kailin Principato, CMA, Dewayne Shorter, MD  Time spent on call:  7:10

## 2022-07-12 NOTE — Telephone Encounter (Signed)
Patient's daughter wanted to talk to the nurse on the phone for the [place] visit for her mom because she was having shortness of breathe and to let the nurse know that the MyChart video was not working and I told her that I would contact Rodena Piety who is working over at [employer] which I did and Rodena Piety say that she will contact her.

## 2022-07-12 NOTE — Progress Notes (Signed)
Location:  Virtual Visit   Place of Service:   Virtual Visit  Provider: Bettylou Frew  Code Status: Full Code Goals of Care:     01/15/2022    7:15 PM  Advanced Directives  Does Patient Have a Medical Advance Directive? No  Would patient like information on creating a medical advance directive? No - Patient declined     Chief Complaint  Patient presents with   Acute Visit    Complains of Head and Chest Cold with Cough. Not able to cough up anything. No Covid testing done.     HPI: Patient is a 89 y.o. female seen today for an acute visit for cough. She had a head cold that has now moved to the chest. Previously she would get a z-pak and things would improve. She has some shortness of breath and has used her inhaler. Last week she had a runny nose. Her symptoms started on Monday and she started to have progressed to a cough. She says if she waits too long she has green and yellow sputum. She has an appointment on Friday with Jessica. She has an 11AM appointment. She previously had mucinex and she didn't like it because it didn't help. She is taking delsym for the coughing and it seems to be helping - it prevents significant coughing, but only a bit of relief. This occurs annually. She has used inhalers for years. They took off the symbicort because she wasn't having severe enough symptoms.   Her daughter is present for the visit. Dry cough started in the middle of the night. Now she can hear it starting to break up. She has audible wheezing per her daughter. Denies increased swelling.   Past Medical History:  Diagnosis Date   Chronic rhinitis    CKD (chronic kidney disease), stage III (HCC)    Disorder of bone and cartilage, unspecified    Essential hypertension, benign    Glaucoma    Per [facility] new patient packet   Hearing loss    Per [place] new patient packet   High blood pressure    Per [facility] new patient packet   High cholesterol    Per [place] new patient packet   Obesity, unspecified     Other and unspecified hyperlipidemia    Type II or unspecified type diabetes mellitus without mention of complication, not stated as uncontrolled    Vision loss    Due to catarct Per [place] new patient packet    Past Surgical History:  Procedure Laterality Date   CESAREAN SECTION N/A    CORONARY/GRAFT ACUTE MI REVASCULARIZATION N/A 01/15/2022   Procedure: Coronary/Graft Acute MI Revascularization;  Surgeon: Adrian Prows, MD;  Location: [facility];  Service: Cardiovascular;  Laterality: N/A;   ESOPHAGOGASTRODUODENOSCOPY N/A 04/29/2014   Procedure: ESOPHAGOGASTRODUODENOSCOPY (EGD);  Surgeon: Milus Banister, MD;  Location: [facility];  Service: Endoscopy;  Laterality: N/A;   LEFT HEART CATH AND CORONARY ANGIOGRAPHY N/A 01/15/2022   Procedure: LEFT HEART CATH AND CORONARY ANGIOGRAPHY;  Surgeon: Adrian Prows, MD;  Location: [facility];  Service: Cardiovascular;  Laterality: N/A;   TOTAL ABDOMINAL HYSTERECTOMY      Allergies  Allergen Reactions   Amoxil [Amoxicillin] Rash   Isosorb Dinitrate-Hydralazine Other (See Comments)    Severe dizziness, altered mental status   Lipitor [Atorvastatin] Other (See Comments)    Myalgias    Motrin [Ibuprofen] Other (See Comments)    Told to avoid due to kidney function   Statins Other (  See Comments)    Myalgias     Outpatient Encounter Medications as of 07/12/2022  Medication Sig   acetaminophen (TYLENOL) 500 MG tablet Take 500 mg by mouth every 4 (four) hours as needed for mild pain or headache.   albuterol (VENTOLIN HFA) 108 (90 Base) MCG/ACT inhaler Inhale 2 puffs into the lungs every 6 (six) hours as needed for wheezing or shortness of breath.   amLODipine (NORVASC) 10 MG tablet TAKE 1 TABLET BY MOUTH DAILY   aspirin 81 MG tablet Take 81 mg by mouth daily.   bisoprolol (ZEBETA) 5 MG tablet Take 1 tablet (5 mg total) by mouth at bedtime.   budesonide-formoterol (SYMBICORT) 80-4.5 MCG/ACT inhaler Inhale 2 puffs into the lungs 2 (two) times  daily as needed (shortness of breath/wheezing).   Cholecalciferol (VITAMIN D-3 PO) Take 1 capsule by mouth daily.   clopidogrel (PLAVIX) 75 MG tablet Take 1 tablet (75 mg total) by mouth daily.   dorzolamide-timolol (COSOPT) 22.3-6.8 MG/ML ophthalmic solution Place 1 drop into both eyes 2 (two) times daily.    ezetimibe (ZETIA) 10 MG tablet TAKE 1 TABLET BY MOUTH DAILY   hydrALAZINE (APRESOLINE) 50 MG tablet Take 0.5 tablets (25 mg total) by mouth 3 (three) times daily.   isosorbide dinitrate (ISORDIL) 30 MG tablet Take 0.5 tablets (15 mg total) by mouth 3 (three) times daily.   levothyroxine (SYNTHROID) 50 MCG tablet TAKE 1 TABLET BY MOUTH DAILY BEFORE BREAKFAST.   losartan (COZAAR) 25 MG tablet TAKE 1 TABLET BY MOUTH DAILY   metFORMIN (GLUCOPHAGE) 500 MG tablet Take 1 tablet (500 mg total) by mouth daily with breakfast.   Multiple Vitamins-Minerals (CENTRUM SILVER PO) Take 1 tablet by mouth daily.   nitroGLYCERIN (NITROSTAT) 0.4 MG SL tablet Place 1 tablet (0.4 mg total) under the tongue every 5 (five) minutes as needed for chest pain.   predniSONE (DELTASONE) 20 MG tablet Take 2 tablets (40 mg total) by mouth daily with breakfast for 5 days.   [DISCONTINUED] furosemide (LASIX) 40 MG tablet Take 1 tablet (40 mg total) by mouth as directed. Take 1 tab every other day, if shortness of breath take twice daily for 3 days   [DISCONTINUED] famotidine (PEPCID) 20 MG tablet Take 1 tablet (20 mg total) by mouth at bedtime as needed for heartburn (COUGH FLARE).   [DISCONTINUED] potassium chloride (KLOR-CON) 10 MEQ tablet Take 1 tablet (10 mEq total) by mouth as directed. Take 2 tab every other day with each furosemide   No facility-administered encounter medications on file as of 07/12/2022.    Review of Systems:  Review of Systems  Health Maintenance  Topic Date Due   FOOT EXAM  Never done   OPHTHALMOLOGY EXAM  Never done   DEXA SCAN  Never done   COVID-19 Vaccine (3 - 2023-24 season) 01/13/2022    HEMOGLOBIN A1C  07/14/2022   Medicare Annual Wellness (AWV)  10/19/2022   DTaP/Tdap/Td (3 - Td or Tdap) 12/06/2026   Pneumonia Vaccine 95+ Years old  Completed   INFLUENZA VACCINE  Completed   HPV VACCINES  Aged Out   Zoster Vaccines- Shingrix  Discontinued    Physical Exam: There were no vitals filed for this visit. There is no height or weight on file to calculate BMI. Physical Exam Vitals reviewed: Unable to perform full exam due to virtual visit. NAD.     Labs reviewed: Basic Metabolic Panel: Recent Labs    01/16/22 0743 01/25/22 1240 04/25/22 1232  NA 130* 137 137  K 4.2 5.0 5.2  CL 98 102 106  CO2 21* 20 19*  GLUCOSE 120* 121* 127*  BUN 44* 54* 36  CREATININE 1.30* 1.47* 1.29*  CALCIUM 9.2 9.2 9.3  MG 2.2  --   --    Liver Function Tests: Recent Labs    01/13/22 1133  AST 12  ALT 7  BILITOT 0.4  PROT 7.4   No results for input(s): "LIPASE", "AMYLASE" in the last 8760 hours. No results for input(s): "AMMONIA" in the last 8760 hours. CBC: Recent Labs    01/13/22 1133 01/15/22 2351 01/16/22 0743 04/25/22 1232  WBC 8.5 12.7* 14.7* 9.7  NEUTROABS 5,602  --   --   --   HGB 12.6 11.4* 11.2* 9.4*  HCT 37.5 32.8* 31.9* 29.4*  MCV 87.8 84.5 84.2 93  PLT 239 251 233 202   Lipid Panel: No results for input(s): "CHOL", "HDL", "LDLCALC", "TRIG", "CHOLHDL", "LDLDIRECT" in the last 8760 hours. Lab Results  Component Value Date   HGBA1C 6.4 (H) 01/13/2022    Procedures since last visit: No results found.  Assessment/Plan Wheezing - Plan: predniSONE (DELTASONE) 20 MG tablet  DOE (dyspnea on exertion) - Plan: predniSONE (DELTASONE) 20 MG tablet  Viral illness Patient with increased work of breathing and wheezing in the setting of likely viral illness. Started with rhinorrhea now with cough. Plan to start prednisone 40 mg daily for 5 days. Hx of pulmonary disease and was on symbicort previously. Discussed concern for acute exacerbation in the setting of  possible viral infection/cold. Plan to follow up in clinic for in person visit on Friday. Can assess for need of antibiotic at that time.    Labs/tests ordered:  * No order type specified * Next appt:  07/12/2022

## 2022-07-12 NOTE — Telephone Encounter (Signed)
Ms. Mikayla [name], Mikayla [name] are scheduled for a virtual visit with your provider today.    Just as we do with appointments in the office, we must obtain your consent to participate.  Your consent will be active for this visit and any virtual visit you Mikayla [name] have with one of our providers in the next 365 days.    If you have a MyChart account, I can also send a copy of this consent to you electronically.  All virtual visits are billed to your insurance company just like a traditional visit in the office.  As this is a virtual visit, video technology does not allow for your provider to perform a traditional examination.  This Mikayla [name] limit your provider's ability to fully assess your condition.  If your provider identifies any concerns that need to be evaluated in person or the need to arrange testing such as labs, EKG, etc, we will make arrangements to do so.    Although advances in technology are sophisticated, we cannot ensure that it will always work on either your end or our end.  If the connection with a video visit is poor, we Mikayla [name] have to switch to a telephone visit.  With either a video or telephone visit, we are not always able to ensure that we have a secure connection.   I need to obtain your verbal consent now.   Are you willing to proceed with your visit today?   Mikayla [name] has provided verbal consent on 07/12/2022 for a virtual visit (video or telephone).   Mikayla [name], [place] 07/12/2022  12:43 PM

## 2022-07-14 ENCOUNTER — Ambulatory Visit
Admission: RE | Admit: 2022-07-14 | Discharge: 2022-07-14 | Disposition: A | Discharge status: Home / Self Care | Payer: Medicare Other | Source: Ambulatory Visit | Attending: Nurse Practitioner | Admitting: Nurse Practitioner

## 2022-07-14 ENCOUNTER — Encounter: Payer: Self-pay | Admitting: Nurse Practitioner

## 2022-07-14 ENCOUNTER — Ambulatory Visit (INDEPENDENT_AMBULATORY_CARE_PROVIDER_SITE_OTHER): Payer: Medicare Other | Admitting: Nurse Practitioner

## 2022-07-14 VITALS — BP 136/60 | HR 64 | Temp 97.9°F | Ht <= 58 in | Wt 118.2 lb

## 2022-07-14 DIAGNOSIS — E1169 Type 2 diabetes mellitus with other specified complication: Secondary | ICD-10-CM

## 2022-07-14 DIAGNOSIS — J4520 Mild intermittent asthma, uncomplicated: Secondary | ICD-10-CM | POA: Diagnosis not present

## 2022-07-14 DIAGNOSIS — R051 Acute cough: Secondary | ICD-10-CM

## 2022-07-14 DIAGNOSIS — I5032 Chronic diastolic (congestive) heart failure: Secondary | ICD-10-CM | POA: Diagnosis not present

## 2022-07-14 DIAGNOSIS — E039 Hypothyroidism, unspecified: Secondary | ICD-10-CM

## 2022-07-14 DIAGNOSIS — D649 Anemia, unspecified: Secondary | ICD-10-CM

## 2022-07-14 DIAGNOSIS — N1832 Chronic kidney disease, stage 3b: Secondary | ICD-10-CM

## 2022-07-14 DIAGNOSIS — E1122 Type 2 diabetes mellitus with diabetic chronic kidney disease: Secondary | ICD-10-CM | POA: Diagnosis not present

## 2022-07-14 DIAGNOSIS — E785 Hyperlipidemia, unspecified: Secondary | ICD-10-CM

## 2022-07-14 DIAGNOSIS — H2000 Unspecified acute and subacute iridocyclitis: Secondary | ICD-10-CM | POA: Diagnosis not present

## 2022-07-14 MED ORDER — POTASSIUM CHLORIDE CRYS ER 20 MEQ PO TBCR: 20.0000 meq | EXTENDED_RELEASE_TABLET | Freq: Every day | ORAL | 3 refills | Status: DC

## 2022-07-14 MED ORDER — FUROSEMIDE 40 MG PO TABS: 40.0000 mg | ORAL_TABLET | Freq: Every day | ORAL | 3 refills | Status: DC

## 2022-07-14 NOTE — Progress Notes (Unsigned)
Careteam: Patient Care Team: Lauree Chandler, NP as PCP - General (Geriatric Medicine) Katy Apo, MD as Consulting Physician (Ophthalmology)  PLACE OF SERVICE:  [place]  Advanced Directive information    Allergies  Allergen Reactions   Amoxil [Amoxicillin] Rash   Isosorb Dinitrate-Hydralazine Other (See Comments)    Severe dizziness, altered mental status   Lipitor [Atorvastatin] Other (See Comments)    Myalgias    Motrin [Ibuprofen] Other (See Comments)    Told to avoid due to kidney function   Statins Other (See Comments)    Myalgias     Chief Complaint  Patient presents with   Medical Management of Chronic Issues    6 month follow-up. Foot exam today. Request for copy of eye exam faxed to eye doctor today. Discuss need for additional covid boosters or post pone if patient refuses or is not a candidate.      HPI: Patient is a 89 y.o. female here for routine follow-up. She says she has had a bad week.   She went to the eye doctor for a R eye issue and was diagnosed with iritis perhaps on Tuesday. She had virtual visit with Dr. Unk Lightning on Wednesday because she had a cold and she wanted some antibiotics because she says she always ends up with bronchitis. She had some shortness of breath. The MD ended up prescribing her prednisone. She took the prednisone Wednesday and Thursday and she feels that her vision became blurry.  She took it this morning and before she took it her vision was fine. Now it's blurry again. She has a friend driving her around.   Patient uses her inhalers as needed, occasionally. They do help. SOB has improved since starting the prednisone.   She does not see Dr. Melvyn Novas anymore. She saw Dr. Einar Gip recently. She stopped her lasix after visit with him. Thought it was odd that he told her to stop, now she is not taking any diuretic. No LE edema.   Denies any swelling, chest pain. She has chronic neuropathy in her feet.   Noted to have anemia on  recent lab work- did not complete ifob. The patient takes plavix. She denies constipation, blood in urine or stool, or excessive bleeding.   Review of Systems:  Review of Systems  Constitutional:  Negative for chills, fever, malaise/fatigue and weight loss.  HENT:  Positive for hearing loss. Negative for congestion and sore throat.        The patient is very [place]. She does not wear her hearing aids "they are too much for me to manage with everything else going on"  Eyes:  Positive for blurred vision.  Respiratory:  Negative for cough, shortness of breath and wheezing.   Cardiovascular:  Negative for chest pain, palpitations and leg swelling.  Gastrointestinal:  Negative for abdominal pain, blood in stool, constipation, diarrhea, heartburn, nausea and vomiting.  Genitourinary:  Negative for dysuria, frequency, hematuria and urgency.  Musculoskeletal:  Negative for falls and joint pain.  Skin:  Negative for rash.  Neurological:  Negative for dizziness, tingling and headaches.  Endo/Heme/Allergies:  Negative for polydipsia.  Psychiatric/Behavioral:  Negative for depression. The patient is not nervous/anxious.   All other systems reviewed and are negative.   Past Medical History:  Diagnosis Date   Chronic rhinitis    CKD (chronic kidney disease), stage III (HCC)    Disorder of bone and cartilage, unspecified    Essential hypertension, benign    Glaucoma  Per [place] new patient packet   Hearing loss    Per [facility] new patient packet   High blood pressure    Per [facility] packet   High cholesterol    Per [facility] new patient packet   Obesity, unspecified    Other and unspecified hyperlipidemia    Type II or unspecified type diabetes mellitus without mention of complication, not stated as uncontrolled    Vision loss    Due to catarct Per [facility] new patient packet   Past Surgical History:  Procedure Laterality Date   CESAREAN SECTION N/A    CORONARY/GRAFT ACUTE MI REVASCULARIZATION N/A  01/15/2022   Procedure: Coronary/Graft Acute MI Revascularization;  Surgeon: Adrian Prows, MD;  Location: [facility];  Service: Cardiovascular;  Laterality: N/A;   ESOPHAGOGASTRODUODENOSCOPY N/A 04/29/2014   Procedure: ESOPHAGOGASTRODUODENOSCOPY (EGD);  Surgeon: Milus Banister, MD;  Location: [facility];  Service: Endoscopy;  Laterality: N/A;   LEFT HEART CATH AND CORONARY ANGIOGRAPHY N/A 01/15/2022   Procedure: LEFT HEART CATH AND CORONARY ANGIOGRAPHY;  Surgeon: Adrian Prows, MD;  Location: [facility];  Service: Cardiovascular;  Laterality: N/A;   TOTAL ABDOMINAL HYSTERECTOMY     Social History:   reports that she has never smoked. She has never used smokeless tobacco. She reports current alcohol use of about 7.0 standard drinks of alcohol per week. She reports that she does not use drugs.  Family History  Problem Relation Age of Onset   COPD Mother    Lung cancer Father    Multiple myeloma Son    Prostate cancer Other    Colon cancer Neg Hx    Esophageal cancer Neg Hx    Rectal cancer Neg Hx    Stomach cancer Neg Hx     Medications: Patient's Medications  New Prescriptions   FUROSEMIDE (LASIX) 40 MG TABLET    Take 1 tablet (40 mg total) by mouth daily.   POTASSIUM CHLORIDE SA (KLOR-CON M) 20 MEQ TABLET    Take 1 tablet (20 mEq total) by mouth daily.  Previous Medications   ACETAMINOPHEN (TYLENOL) 500 MG TABLET    Take 500 mg by mouth every 4 (four) hours as needed for mild pain or headache.   ALBUTEROL (VENTOLIN HFA) 108 (90 BASE) MCG/ACT INHALER    Inhale 2 puffs into the lungs every 6 (six) hours as needed for wheezing or shortness of breath.   AMLODIPINE (NORVASC) 10 MG TABLET    TAKE 1 TABLET BY MOUTH DAILY   ASPIRIN 81 MG TABLET    Take 81 mg by mouth daily.   BISOPROLOL (ZEBETA) 5 MG TABLET    Take 1 tablet (5 mg total) by mouth at bedtime.   BUDESONIDE-FORMOTEROL (SYMBICORT) 80-4.5 MCG/ACT INHALER    Inhale 2 puffs into the lungs 2 (two) times daily as needed  (shortness of breath/wheezing).   CHOLECALCIFEROL (VITAMIN D-3 PO)    Take 1 capsule by mouth daily.   CLOPIDOGREL (PLAVIX) 75 MG TABLET    Take 1 tablet (75 mg total) by mouth daily.   DORZOLAMIDE-TIMOLOL (COSOPT) 22.3-6.8 MG/ML OPHTHALMIC SOLUTION    Place 1 drop into both eyes 2 (two) times daily.    EZETIMIBE (ZETIA) 10 MG TABLET    TAKE 1 TABLET BY MOUTH DAILY   HYDRALAZINE (APRESOLINE) 50 MG TABLET    Take 0.5 tablets (25 mg total) by mouth 3 (three) times daily.   ISOSORBIDE DINITRATE (ISORDIL) 30 MG TABLET    Take 0.5 tablets (15  mg total) by mouth 3 (three) times daily.   LEVOTHYROXINE (SYNTHROID) 50 MCG TABLET    TAKE 1 TABLET BY MOUTH DAILY BEFORE BREAKFAST.   LOSARTAN (COZAAR) 25 MG TABLET    TAKE 1 TABLET BY MOUTH DAILY   METFORMIN (GLUCOPHAGE) 500 MG TABLET    Take 1 tablet (500 mg total) by mouth daily with breakfast.   MULTIPLE VITAMINS-MINERALS (CENTRUM SILVER PO)    Take 1 tablet by mouth daily.   NITROGLYCERIN (NITROSTAT) 0.4 MG SL TABLET    Place 1 tablet (0.4 mg total) under the tongue every 5 (five) minutes as needed for chest pain.   PREDNISONE (DELTASONE) 20 MG TABLET    Take 2 tablets (40 mg total) by mouth daily with breakfast for 5 days.  Modified Medications   No medications on file  Discontinued Medications   No medications on file    Physical Exam:  Vitals:   07/14/22 0851  BP: 136/60  Pulse: 64  Temp: 97.9 F (36.6 C)  TempSrc: Temporal  SpO2: 97%  Weight: 118 lb 3.2 oz (53.6 kg)  Height: '4\' 10"'$  (1.473 m)   Body mass index is 24.7 kg/m. Wt Readings from Last 3 Encounters:  07/14/22 118 lb 3.2 oz (53.6 kg)  04/25/22 123 lb 6.4 oz (56 kg)  03/28/22 121 lb 12.8 oz (55.2 kg)    Physical Exam Vitals reviewed.  Constitutional:      General: She is not in acute distress.    Appearance: Normal appearance.  Cardiovascular:     Rate and Rhythm: Normal rate and regular rhythm.  Pulmonary:     Effort: No respiratory distress.     Breath sounds:  Normal breath sounds.  Abdominal:     General: Bowel sounds are normal. There is no distension.     Palpations: Abdomen is soft. There is no mass.     Tenderness: There is no abdominal tenderness. There is no guarding.  Musculoskeletal:     Cervical back: Neck supple.  Lymphadenopathy:     Cervical: No cervical adenopathy.  Skin:    General: Skin is warm and dry.  Neurological:     Mental Status: She is alert and oriented to person, place, and time.  Psychiatric:        Mood and Affect: Mood normal.     Labs reviewed: Basic Metabolic Panel: Recent Labs    01/16/22 0743 01/25/22 1240 04/25/22 1232  NA 130* 137 137  K 4.2 5.0 5.2  CL 98 102 106  CO2 21* 20 19*  GLUCOSE 120* 121* 127*  BUN 44* 54* 36  CREATININE 1.30* 1.47* 1.29*  CALCIUM 9.2 9.2 9.3  MG 2.2  --   --    Liver Function Tests: Recent Labs    01/13/22 1133  AST 12  ALT 7  BILITOT 0.4  PROT 7.4   No results for input(s): "LIPASE", "AMYLASE" in the last 8760 hours. No results for input(s): "AMMONIA" in the last 8760 hours. CBC: Recent Labs    01/13/22 1133 01/15/22 2351 01/16/22 0743 04/25/22 1232  WBC 8.5 12.7* 14.7* 9.7  NEUTROABS 5,602  --   --   --   HGB 12.6 11.4* 11.2* 9.4*  HCT 37.5 32.8* 31.9* 29.4*  MCV 87.8 84.5 84.2 93  PLT 239 251 233 202   Lipid Panel: No results for input(s): "CHOL", "HDL", "LDLCALC", "TRIG", "CHOLHDL", "LDLDIRECT" in the last 8760 hours. TSH: No results for input(s): "TSH" in the last 8760 hours. A1C: Lab  Results  Component Value Date   HGBA1C 6.4 (H) 01/13/2022     Assessment/Plan 1. Hypothyroidism, acquired Continue levothyroxine. Stable. - TSH  2. Anemia, normocytic normochromic Hgb decreased at December visit with Dr. Einar Gip. Explained to patient that she is on plavix and it is necessary to check a stool sample to see if she is bleeding. She verbalized understanding.  - will follow up CBC with Differential/Platelet  3. Mild intermittent  extrinsic asthma without complication  she is taking both the symbicort and the albuterol as needed. Schedule symbicort as she reports chronically having episodes of SOB and was diagnosed with mild asthma prior.   4. Type 2 diabetes mellitus with stage 3b chronic kidney disease, without long-term current use of insulin (HCC) Check A1C today. Pt reports blurry vision with prednisone use. Discontinue prednisone.  - Hemoglobin A1c  5. Hyperlipidemia associated with type 2 diabetes mellitus (HCC) Continue statin.   6. Stage 3b chronic kidney disease ([place]) Adjust medications as needed for GFR, maintain adequate hydration for kidney function. Monitor  7. Acute cough Pt's BNP was elevated to 4700 at December visit with Dr. Einar Gip. Patient stopped her lasix at this time and has not followed up with Dr. Einar Gip, reporting increased SOB with cough last week. This has improved with prednisone she was prescribed earlier this week but caused increase blurry vision so will stop.CXR, BNP - Cardio IQ NT ProBNP - DG Chest 2 View; Future  8. Chronic diastolic heart failure (HCC) Restart lasix and potassium, will follow up labs and get xray at this time.  Discussed with patient at length about taking lasix, heart failure, and symptoms of heart failure. She verbalized understanding. - furosemide (LASIX) 40 MG tablet; Take 1 tablet (40 mg total) by mouth daily.  Dispense: 30 tablet; Refill: 3 - potassium chloride SA (KLOR-CON M) 20 MEQ tablet; Take 1 tablet (20 mEq total) by mouth daily.  Dispense: 30 tablet; Refill: 3   Return in about 4 months (around 11/13/2022) for routine follow up.  Student- Archer Asa O'Berry ACPCNP-S  I personally was present during the history, physical exam and medical decision-making activities of this service and have verified that the service and findings are accurately documented in the student's note Cheris Tweten K. [place], [facility] [phone]

## 2022-07-14 NOTE — Patient Instructions (Signed)
Stop PREDNISONE  GET XRAY AT [facility] WHEN YOU LEAVE HERE.  Take symbicort twice daily  Restart lasix and potassium

## 2022-07-19 LAB — CBC WITH DIFFERENTIAL/PLATELET
Absolute Monocytes: 232 cells/uL (ref 200–950)
Basophils Absolute: 0 cells/uL (ref 0–200)
Basophils Relative: 0 %
Eosinophils Absolute: 0 cells/uL — ABNORMAL LOW (ref 15–500)
Eosinophils Relative: 0 %
HCT: 32 % — ABNORMAL LOW (ref 35.0–45.0)
Hemoglobin: 10.4 g/dL — ABNORMAL LOW (ref 11.7–15.5)
Lymphs Abs: 748 cells/uL — ABNORMAL LOW (ref 850–3900)
MCH: 28.6 pg (ref 27.0–33.0)
MCHC: 32.5 g/dL (ref 32.0–36.0)
MCV: 87.9 fL (ref 80.0–100.0)
MPV: 9.7 fL (ref 7.5–12.5)
Monocytes Relative: 2.7 %
Neutro Abs: 7620 cells/uL (ref 1500–7800)
Neutrophils Relative %: 88.6 %
Platelets: 267 10*3/uL (ref 140–400)
RBC: 3.64 10*6/uL — ABNORMAL LOW (ref 3.80–5.10)
RDW: 13 % (ref 11.0–15.0)
Total Lymphocyte: 8.7 %
WBC: 8.6 10*3/uL (ref 3.8–10.8)

## 2022-07-19 LAB — TSH: TSH: 0.75 mIU/L (ref 0.40–4.50)

## 2022-07-19 LAB — HEMOGLOBIN A1C
Hgb A1c MFr Bld: 5.8 % of total Hgb — ABNORMAL HIGH (ref <5.7)
Mean Plasma Glucose: 120 mg/dL
eAG (mmol/L): 6.6 mmol/L

## 2022-07-19 LAB — CARDIO IQ® NT PROBNP: NT PROBNP: 7147 pg/mL — ABNORMAL HIGH (ref <450)

## 2022-07-21 NOTE — Telephone Encounter (Signed)
This encounter was created in error - please disregard.

## 2022-07-25 DIAGNOSIS — H353221 Exudative age-related macular degeneration, left eye, with active choroidal neovascularization: Secondary | ICD-10-CM | POA: Diagnosis not present

## 2022-07-31 ENCOUNTER — Telehealth: Payer: Self-pay | Admitting: *Deleted

## 2022-07-31 NOTE — Patient Outreach (Signed)
  Care Coordination   07/31/2022 Name: Mikayla [name] MRN: [id number] DOB: Jan 11, 1926   Care Coordination Outreach Attempts:  An unsuccessful telephone outreach was attempted today to offer the patient information about available care coordination services as a benefit of their health plan.   Follow Up Plan:  Additional outreach attempts will be made to offer the patient care coordination information and services.   Encounter Outcome:  No Answer   Care Coordination Interventions:  No, not indicated    Eduard Clos, MSW, [place] Worker [facility] [employer] [facility] [phone]

## 2022-08-10 ENCOUNTER — Ambulatory Visit (INDEPENDENT_AMBULATORY_CARE_PROVIDER_SITE_OTHER): Payer: Medicare Other | Admitting: Podiatry

## 2022-08-10 DIAGNOSIS — M79675 Pain in left toe(s): Secondary | ICD-10-CM | POA: Diagnosis not present

## 2022-08-10 DIAGNOSIS — M79674 Pain in right toe(s): Secondary | ICD-10-CM

## 2022-08-10 DIAGNOSIS — E1169 Type 2 diabetes mellitus with other specified complication: Secondary | ICD-10-CM

## 2022-08-10 DIAGNOSIS — E669 Obesity, unspecified: Secondary | ICD-10-CM

## 2022-08-10 DIAGNOSIS — L84 Corns and callosities: Secondary | ICD-10-CM

## 2022-08-10 DIAGNOSIS — B351 Tinea unguium: Secondary | ICD-10-CM | POA: Diagnosis not present

## 2022-08-10 NOTE — Progress Notes (Signed)
  Subjective:  Patient ID: Mikayla [name], female    DOB: 1925/06/10,  MRN: [id number]  Chief Complaint  Patient presents with   Nail Problem    Thick painful toenails, 3 month follow up    89 y.o. female presents with the above complaint. History confirmed with patient.  Last debridement was very helpful.  No new problems.  Objective:  Physical Exam: warm, good capillary refill, no trophic changes or ulcerative lesions, normal DP and PT pulses, normal monofilament exam, normal sensory exam, and varicose veins were noted. Left Foot: dystrophic yellowed discolored nail plates with subungual debris and submetatarsal 1 and 5 hyperkeratotic lesion tender to touch Right Foot: dystrophic yellowed discolored nail plates with subungual debris, submetatarsal 5 hyperkeratotic lesion tender to touch Assessment:   1. Pain due to onychomycosis of toenails of both feet   2. Callus of foot   3. Diabetes mellitus type 2 in obese (HCC) [E11.69, E66.9]      Plan:  Patient was evaluated and treated and all questions answered.     Discussed the etiology and treatment options for the condition in detail with the patient.  Regular debridement has been helpful so far. Recommended debridement of the nails today. Sharp and mechanical debridement performed of all painful and mycotic nails today. Nails debrided in length and thickness using a nail nipper to level of comfort. Discussed treatment options including appropriate shoe gear. Follow up as needed for painful nails.   All symptomatic hyperkeratoses were safely debrided with a sterile #15 blade to patient's level of comfort without incident. We discussed preventative and palliative care of these lesions including supportive and accommodative shoegear, padding, prefabricated and custom molded accommodative orthoses, use of a pumice stone and lotions/creams daily.   Return in about 12 weeks (around 08/03/2022) for at risk diabetic foot care.

## 2022-08-24 ENCOUNTER — Other Ambulatory Visit: Payer: Medicare Other

## 2022-09-04 ENCOUNTER — Other Ambulatory Visit: Payer: Self-pay | Admitting: Nurse Practitioner

## 2022-09-04 DIAGNOSIS — N1832 Chronic kidney disease, stage 3b: Secondary | ICD-10-CM

## 2022-09-22 ENCOUNTER — Ambulatory Visit: Payer: Medicare Other

## 2022-09-22 DIAGNOSIS — I6522 Occlusion and stenosis of left carotid artery: Secondary | ICD-10-CM | POA: Diagnosis not present

## 2022-09-23 NOTE — Progress Notes (Signed)
Carotid artery duplex 09/22/2022: Duplex suggests stenosis in the right internal carotid artery (1-15%). Duplex suggests stenosis in the left internal carotid artery (>=70%). Peak velocity in the left ICA was 342/82 cm/s. The left PSV internal/common carotid artery ratio of 6.0 is consistent with a stenosis of >70%. There is <50%  stenosis in the left external carotid artery. Antegrade left vertebral artery flow. Compared to the study done on 02/18/2022, there is progression of disease on the left ICA from <70% to the present >70%. Follow up in six months is appropriate if clinically indicated.

## 2022-10-05 DIAGNOSIS — H353221 Exudative age-related macular degeneration, left eye, with active choroidal neovascularization: Secondary | ICD-10-CM | POA: Diagnosis not present

## 2022-10-10 ENCOUNTER — Other Ambulatory Visit: Payer: Self-pay | Admitting: Cardiology

## 2022-10-18 ENCOUNTER — Other Ambulatory Visit: Payer: Self-pay | Admitting: Cardiology

## 2022-10-24 ENCOUNTER — Ambulatory Visit (INDEPENDENT_AMBULATORY_CARE_PROVIDER_SITE_OTHER): Payer: Medicare Other | Admitting: Podiatry

## 2022-10-24 ENCOUNTER — Ambulatory Visit: Payer: Medicare Other | Admitting: Cardiology

## 2022-10-24 ENCOUNTER — Encounter: Payer: Self-pay | Admitting: Podiatry

## 2022-10-24 ENCOUNTER — Encounter: Payer: Medicare Other | Admitting: Nurse Practitioner

## 2022-10-24 ENCOUNTER — Encounter: Payer: Self-pay | Admitting: Cardiology

## 2022-10-24 VITALS — BP 140/54 | HR 59 | Ht <= 58 in | Wt 117.8 lb

## 2022-10-24 DIAGNOSIS — I10 Essential (primary) hypertension: Secondary | ICD-10-CM | POA: Diagnosis not present

## 2022-10-24 DIAGNOSIS — L84 Corns and callosities: Secondary | ICD-10-CM | POA: Diagnosis not present

## 2022-10-24 DIAGNOSIS — E119 Type 2 diabetes mellitus without complications: Secondary | ICD-10-CM | POA: Diagnosis not present

## 2022-10-24 DIAGNOSIS — M79675 Pain in left toe(s): Secondary | ICD-10-CM

## 2022-10-24 DIAGNOSIS — B351 Tinea unguium: Secondary | ICD-10-CM

## 2022-10-24 DIAGNOSIS — I6522 Occlusion and stenosis of left carotid artery: Secondary | ICD-10-CM

## 2022-10-24 DIAGNOSIS — M79674 Pain in right toe(s): Secondary | ICD-10-CM

## 2022-10-24 DIAGNOSIS — I5032 Chronic diastolic (congestive) heart failure: Secondary | ICD-10-CM

## 2022-10-24 DIAGNOSIS — I25118 Atherosclerotic heart disease of native coronary artery with other forms of angina pectoris: Secondary | ICD-10-CM | POA: Diagnosis not present

## 2022-10-24 NOTE — Progress Notes (Signed)
Primary Physician/Referring:  Sharon Seller, NP  Patient ID: Mikayla [name], female    DOB: 1925-09-14, 89 y.o.   MRN: [id number]  Chief Complaint  Patient presents with   Coronary Artery Disease   Hypertension   Hyperlipidemia   Acute on chronic diastolic heart failure   Follow-up    6 months   HPI:    Mikayla [name]  is a 89 y.o. female patient with hypertension, hypercholesterolemia, diabetes mellitus controlled on metformin, stage IIIa-b chronic kidney disease, chronic hyponatremia inferior STEMI on 01/15/2022 SB angioplasty to RCA, chronic diastolic heart failure presents here for 17-month follow-up.     She is presently doing well, has occasional episodes of dyspnea but no PND or orthopnea.  No leg edema.  No TIA, tolerating all medications well.  Past Medical History:  Diagnosis Date   Chronic rhinitis    CKD (chronic kidney disease), stage III (HCC)    Disorder of bone and cartilage, unspecified    Essential hypertension, benign    Glaucoma    Per [facility] new patient packet   Hearing loss    Per [facility] new patient packet   High blood pressure    Per [facility] new patient packet   High cholesterol    Per [facility] new patient packet   Obesity, unspecified    Other and unspecified hyperlipidemia    Type II or unspecified type diabetes mellitus without mention of complication, not stated as uncontrolled    Vision loss    Due to catarct Per [facility] new patient packet   Past Surgical History:  Procedure Laterality Date   CESAREAN SECTION N/A    CORONARY/GRAFT ACUTE MI REVASCULARIZATION N/A 01/15/2022   Procedure: Coronary/Graft Acute MI Revascularization;  Surgeon: Yates Decamp, MD;  Location: [facility];  Service: Cardiovascular;  Laterality: N/A;   ESOPHAGOGASTRODUODENOSCOPY N/A 04/29/2014   Procedure: ESOPHAGOGASTRODUODENOSCOPY (EGD);  Surgeon: Rachael Fee, MD;  Location: [facility];  Service: Endoscopy;  Laterality: N/A;   LEFT HEART CATH AND CORONARY ANGIOGRAPHY  N/A 01/15/2022   Procedure: LEFT HEART CATH AND CORONARY ANGIOGRAPHY;  Surgeon: Yates Decamp, MD;  Location: [facility];  Service: Cardiovascular;  Laterality: N/A;   TOTAL ABDOMINAL HYSTERECTOMY     Family History  Problem Relation Age of Onset   COPD Mother    Lung cancer Father    Multiple myeloma Son    Prostate cancer Other    Colon cancer Neg Hx    Esophageal cancer Neg Hx    Rectal cancer Neg Hx    Stomach cancer Neg Hx     Social History   Tobacco Use   Smoking status: Never   Smokeless tobacco: Never  Substance Use Topics   Alcohol use: Yes    Alcohol/week: 7.0 standard drinks of alcohol    Types: 7 Standard drinks or equivalent per week    Comment: wine with dinner   Marital Status: Married  ROS  Review of Systems  Cardiovascular:  Positive for dyspnea on exertion. Negative for chest pain and leg swelling.  Respiratory:  Positive for wheezing.    Objective      10/24/2022   10:44 AM 10/24/2022   10:43 AM 07/14/2022    8:51 AM  Vitals with BMI  Height  4\' 10"  4\' 10"   Weight  117 lbs 13 oz 118 lbs 3 oz  BMI  24.63 24.71  Systolic 140 177 409  Diastolic 54 55 60  Pulse 59 61 64  Today's Vitals   10/24/22 1043 10/24/22 1044  BP: (!) 177/55 (!) 140/54  Pulse: 61 (!) 59  SpO2: 97%   Weight: 117 lb 12.8 oz (53.4 kg)   Height: 4\' 10"  (1.473 m)    Body mass index is 24.62 kg/m.  Physical Exam Neck:     Vascular: No carotid bruit or JVD.  Cardiovascular:     Rate and Rhythm: Normal rate and regular rhythm.     Pulses: Intact distal pulses.          Carotid pulses are  on the right side with bruit and  on the left side with bruit.    Heart sounds: Murmur heard.     Midsystolic murmur is present with a grade of 2/6 radiating to the apex.     No gallop.  Pulmonary:     Effort: Pulmonary effort is normal.     Breath sounds: Normal breath sounds.  Abdominal:     General: Bowel sounds are normal.     Palpations: Abdomen is soft.  Musculoskeletal:      Right lower leg: No edema.     Left lower leg: No edema.    Laboratory examination:   Lab Results  Component Value Date   NA 137 04/25/2022   K 5.2 04/25/2022   CO2 19 (L) 04/25/2022   GLUCOSE 127 (H) 04/25/2022   BUN 36 04/25/2022   CREATININE 1.29 (H) 04/25/2022   CALCIUM 9.3 04/25/2022   EGFR 38 (L) 04/25/2022   GFRNONAA 38 (L) 01/16/2022       Latest Ref Rng & Units 04/25/2022   12:32 PM 01/25/2022   12:40 PM 01/16/2022    7:43 AM  BMP  Glucose 70 - 99 mg/dL 161  096  045   BUN 10 - 36 mg/dL 36  54  44   Creatinine 0.57 - 1.00 mg/dL 4.09  8.11  9.14   BUN/Creat Ratio 12 - 28 28  37    Sodium 134 - 144 mmol/L 137  137  130   Potassium 3.5 - 5.2 mmol/L 5.2  5.0  4.2   Chloride 96 - 106 mmol/L 106  102  98   CO2 20 - 29 mmol/L 19  20  21    Calcium 8.7 - 10.3 mg/dL 9.3  9.2  9.2       Latest Ref Rng & Units 01/13/2022   11:33 AM 06/21/2021   10:35 AM 06/15/2020   10:53 AM  Hepatic Function  Total Protein 6.1 - 8.1 g/dL 7.4  7.1  6.8   Albumin 3.5 - 5.2 g/dL  4.1  4.0   AST 10 - 35 U/L 12  12  11    ALT 6 - 29 U/L 7  8  7    Alk Phosphatase 39 - 117 U/L  34  36   Total Bilirubin 0.2 - 1.2 mg/dL 0.4  0.5  0.4   Bilirubin, Direct 0.0 - 0.3 mg/dL  0.0  0.1    Lab Results  Component Value Date   CHOL 291 (H) 06/21/2021   HDL 44.00 06/21/2021   LDLCALC 125 (H) 03/13/2017   LDLDIRECT 156.0 06/21/2021   TRIG 274.0 (H) 06/21/2021   CHOLHDL 7 06/21/2021    Lipoprotein (a) <75.0 nmol/L 38.3 High      HEMOGLOBIN A1C Lab Results  Component Value Date   HGBA1C 5.8 (H) 07/14/2022   MPG 120 07/14/2022   TSH Recent Labs    07/14/22 1212  TSH 0.75  BNP (last 3 results) Recent Labs    01/16/22 0743  BNP 639.2*   ProBNP (last 3 results) Recent Labs    04/25/22 1232  PROBNP 4,724*   Radiology:   Chest x-ray 1 view 01/15/2022: Background of emphysema. No focal consolidation, pleural effusion, or pneumothorax. The cardiac silhouette is within  limits. Atherosclerotic calcification of the aorta. No acute osseous pathology. Osteopenia  Cardiac Studies:   Echocardiogram 01/16/2022:  1. Left ventricular ejection fraction, by estimation, is 55 to 60%. The left ventricle has normal function. The left ventricle demonstrates regional wall motion abnormalities (see scoring diagram/findings for description). There is moderate left ventricular hypertrophy. Left ventricular diastolic parameters are consistent with Grade II diastolic dysfunction (pseudonormalization). Elevated left ventricular end-diastolic pressure. The E/e' is 24.1. There is moderate hypokinesis of the left ventricular, basal-mid inferior wall.  2. Right ventricular systolic function is normal. The right ventricular size is normal. Mildly increased right ventricular wall thickness. Tricuspid regurgitation signal is inadequate for assessing PA pressure.  3. Left atrial size was mildly dilated.  4. The mitral valve is normal in structure. Moderate mitral valve regurgitation. No evidence of mitral stenosis.  5. The aortic valve is tricuspid. Aortic valve regurgitation is not visualized. Aortic valve sclerosis/calcification is present, without any evidence of aortic stenosis.  6. There is mild (Grade II) plaque.    Left Heart Catheterization 01/15/22:  LV not performed. LM: Calcified without luminal obstruction. LAD: Mild diffuse disease, focal 40% stenosis in the midsegment, mild to moderate diffuse calcification.  Small D1 and D2 and D3.  Faint collaterals are noted to the distal RCA. CX: Small calibered vessel.  Gives origin to a small caliber but long OM1 and OM 3.  Proximal to mid segment of Cx has a focal calcific 70% stenosis. RCA: Dominant vessel, gives origin to large PL and PDA.  Mildly diffusely calcified.  Occluder of the origin of RV branch. Intervention data: Successful PTCA and stenting of the mid RCA with implantation of a 3.5 x 16 mm Synergy XT DES at 12 atmospheric  pressure, stenosis reduced from 100% to 0%, TIMI 0 to TIMI-3 flow.  65 mill contrast utilized.   Recommendation: Patient will be observed in the intensive care unit.  Cardiac rehab will be referred for.  She will need dual antiplatelet therapy for a year.  Medical therapy for CX disease.  Aggressive risk modification.     01/15/2022: 3.5 x 16 mm Synergy XT   Carotid artery duplex 09/22/2022: Duplex suggests stenosis in the right internal carotid artery (1-15%). Duplex suggests stenosis in the left internal carotid artery (>=70%). Peak velocity in the left ICA was 342/82 cm/s. The left PSV internal/common carotid artery ratio of 6.0 is consistent with a stenosis of >70%. There is <50%  stenosis in the left external carotid artery. Antegrade left vertebral artery flow. Compared to the study done on 02/18/2022, there is progression of disease on the left ICA from <70% to the present >70%. Follow up in six months is appropriate if clinically indicated.  EKG:   EKG 10/24/2022: Sinus rhythm with fascicular block.  Of 59 bpm, cannot exclude inferior infarct old.  Poor R progression, cannot exclude anteroseptal infarct old.  No evidence of ischemia, normal QT interval.  Compared to 01/25/2022, inferior T wave inversion no longer present.  Medications and allergies   Allergies  Allergen Reactions   Amoxil [Amoxicillin] Rash   Isosorb Dinitrate-Hydralazine Other (See Comments)    Severe dizziness, altered mental status  Lipitor [Atorvastatin] Other (See Comments)    Myalgias    Motrin [Ibuprofen] Other (See Comments)    Told to avoid due to kidney function   Statins Other (See Comments)    Myalgias     Current Outpatient Medications:    acetaminophen (TYLENOL) 500 MG tablet, Take 500 mg by mouth every 4 (four) hours as needed for mild pain or headache., Disp: , Rfl:    albuterol (VENTOLIN HFA) 108 (90 Base) MCG/ACT inhaler, Inhale 2 puffs into the lungs every 6 (six) hours as needed for  wheezing or shortness of breath., Disp: 8 g, Rfl: 2   amLODipine (NORVASC) 10 MG tablet, TAKE 1 TABLET BY MOUTH DAILY, Disp: 90 tablet, Rfl: 1   bisoprolol (ZEBETA) 5 MG tablet, Take 1 tablet (5 mg total) by mouth at bedtime., Disp: , Rfl:    budesonide-formoterol (SYMBICORT) 80-4.5 MCG/ACT inhaler, Inhale 2 puffs into the lungs 2 (two) times daily as needed (shortness of breath/wheezing)., Disp: 1 each, Rfl: 5   Cholecalciferol (VITAMIN D-3 PO), Take 1 capsule by mouth daily., Disp: , Rfl:    clopidogrel (PLAVIX) 75 MG tablet, Take 1 tablet (75 mg total) by mouth daily., Disp: 90 tablet, Rfl: 3   dorzolamide-timolol (COSOPT) 22.3-6.8 MG/ML ophthalmic solution, Place 1 drop into both eyes 2 (two) times daily. , Disp: , Rfl:    ezetimibe (ZETIA) 10 MG tablet, TAKE 1 TABLET BY MOUTH DAILY, Disp: 90 tablet, Rfl: 1   furosemide (LASIX) 40 MG tablet, Take 1 tablet (40 mg total) by mouth daily., Disp: 30 tablet, Rfl: 3   hydrALAZINE (APRESOLINE) 50 MG tablet, Take 0.5 tablets (25 mg total) by mouth 3 (three) times daily., Disp: 90 tablet, Rfl: 2   isosorbide dinitrate (ISORDIL) 30 MG tablet, Take 0.5 tablets (15 mg total) by mouth 3 (three) times daily., Disp: 90 tablet, Rfl: 2   levothyroxine (SYNTHROID) 50 MCG tablet, TAKE 1 TABLET BY MOUTH DAILY BEFORE BREAKFAST., Disp: 90 tablet, Rfl: 3   losartan (COZAAR) 25 MG tablet, TAKE 1 TABLET BY MOUTH DAILY, Disp: 90 tablet, Rfl: 1   metFORMIN (GLUCOPHAGE) 500 MG tablet, TAKE 1 TABLET (500 MG TOTAL) BY MOUTH DAILY WITH BREAKFAST., Disp: 90 tablet, Rfl: 1   Multiple Vitamins-Minerals (CENTRUM SILVER PO), Take 1 tablet by mouth daily., Disp: , Rfl:    nitroGLYCERIN (NITROSTAT) 0.4 MG SL tablet, Place 1 tablet (0.4 mg total) under the tongue every 5 (five) minutes as needed for chest pain., Disp: 25 tablet, Rfl: 2   Assessment     ICD-10-CM   1. Coronary artery disease of native artery of native heart with stable angina pectoris (HCC)  I25.118 EKG 12-Lead     2. Primary hypertension  I10     3. White coat syndrome with diagnosis of hypertension  I10     4. Chronic diastolic heart failure (HCC)  Z61.09     5. Carotid stenosis, asymptomatic, left  I65.22        Orders Placed This Encounter  Procedures   EKG 12-Lead    No orders of the defined types were placed in this encounter.   Medications Discontinued During This Encounter  Medication Reason   potassium chloride SA (KLOR-CON M) 20 MEQ tablet    aspirin 81 MG tablet Completed Course      Recommendations:   Mikayla [name] is a 89 y.o. female patient with hypertension, hypercholesterolemia, diabetes mellitus controlled on metformin, stage IIIa-b chronic kidney disease, chronic hyponatremia inferior STEMI on 01/15/2022  SB angioplasty to RCA, chronic diastolic heart failure presents here for 6-week follow-up.     1. Coronary artery disease of native artery of native heart with stable angina pectoris [facility]) She has not had any recurrence of angina pectoris.  She is presently doing well, continue present medication.  As she has completed 1 year or greater than 1 year course of dual antiplatelet therapy, will discontinue aspirin and continue Plavix indefinitely.  2. Primary hypertension Blood pressure is now significantly improved since being on isosorbide dinitrate and also hydralazine, even at reduced dose, blood pressure under excellent control.  3. White coat syndrome with diagnosis of hypertension Blood pressure today was elevated upon presentation but improved after sitting for a while. She also does not want any significant changes to be made to her medications  4. Chronic diastolic heart failure (HCC) She has mild chronic dyspnea, no clinical evidence of heart failure. Continue present meds. Stage 3b CKD stable  5. Asymptomatic left carotid stenosis. Carotid artery duplex, there is slight progression of carotid stenosis.  However as she does not want any surgery, continue  medical therapy and secondary prevention is indicated.  I will discontinue further surveillance.  Would also be extremely high risk for surgical procedures as well given her advanced age.  6. Hypercholesteremia Patient is presently only on Zetia, does not want to be on any injectables or on any statin therapy. OV in 1 year.     Yates Decamp, MD, [facility] 10/24/2022, 11:53 AM Office: [phone]

## 2022-10-24 NOTE — Progress Notes (Signed)
  Subjective:  Patient ID: Mikayla [name], female    DOB: 04-07-1926,  MRN: [id number]  Chief Complaint  Patient presents with   Nail Problem    DFC    89 y.o. female presents with the above complaint. History confirmed with patient.  Last debridement was very helpful.  No new problems.  Calluses have thickened again but overall pain is improved since initiation of therapy  Objective:  Physical Exam: warm, good capillary refill, no trophic changes or ulcerative lesions, normal DP and PT pulses, normal monofilament exam, normal sensory exam, and varicose veins were noted. Left Foot: dystrophic yellowed discolored nail plates with subungual debris and submetatarsal 1 and 5 hyperkeratotic lesion tender to touch Right Foot: dystrophic yellowed discolored nail plates with subungual debris, submetatarsal 5 hyperkeratotic lesion tender to touch Assessment:   1. Pain due to onychomycosis of toenails of both feet   2. Callus of foot   3. Controlled type 2 diabetes mellitus without complication, without long-term current use of insulin (HCC)       Plan:  Patient was evaluated and treated and all questions answered.     Discussed the etiology and treatment options for the condition in detail with the patient.  Regular debridement has been helpful so far. Recommended debridement of the nails today. Sharp and mechanical debridement performed of all painful and mycotic nails today. Nails debrided in length and thickness using a nail nipper to level of comfort. Discussed treatment options including appropriate shoe gear. Follow up as needed for painful nails.   All symptomatic hyperkeratoses were safely debrided with a sterile #15 blade to patient's level of comfort without incident. We discussed preventative and palliative care of these lesions including supportive and accommodative shoegear, padding, prefabricated and custom molded accommodative orthoses, use of a pumice stone and  lotions/creams daily.   Return in about 12 weeks (around 01/16/2023) for at risk diabetic foot care.

## 2022-10-30 ENCOUNTER — Telehealth: Payer: Self-pay

## 2022-10-30 ENCOUNTER — Encounter: Payer: Self-pay | Admitting: Cardiology

## 2022-10-30 NOTE — Telephone Encounter (Signed)
Pt called stating that she will be willing to try something else for chol as long as it DOES NOT contain a statin. She felt as though there was some mis-communication at the office visit. Please review and advise and send back to clinical//ah

## 2022-10-30 NOTE — Telephone Encounter (Signed)
Bempidoic acid (Nexlizet) expensive but non statin. I can Rx. Not generic

## 2022-10-31 NOTE — Telephone Encounter (Signed)
From pt

## 2022-11-01 DIAGNOSIS — H26493 Other secondary cataract, bilateral: Secondary | ICD-10-CM | POA: Diagnosis not present

## 2022-11-01 DIAGNOSIS — Z961 Presence of intraocular lens: Secondary | ICD-10-CM | POA: Diagnosis not present

## 2022-11-01 DIAGNOSIS — H401131 Primary open-angle glaucoma, bilateral, mild stage: Secondary | ICD-10-CM | POA: Diagnosis not present

## 2022-11-13 ENCOUNTER — Ambulatory Visit: Payer: Medicare Other | Admitting: Nurse Practitioner

## 2022-12-05 DIAGNOSIS — H353221 Exudative age-related macular degeneration, left eye, with active choroidal neovascularization: Secondary | ICD-10-CM | POA: Diagnosis not present

## 2022-12-14 ENCOUNTER — Encounter: Payer: Self-pay | Admitting: Family

## 2022-12-14 ENCOUNTER — Ambulatory Visit (INDEPENDENT_AMBULATORY_CARE_PROVIDER_SITE_OTHER): Payer: Medicare Other | Admitting: Family

## 2022-12-14 VITALS — BP 128/64 | HR 55 | Temp 97.7°F | Resp 18 | Ht <= 58 in | Wt 117.0 lb

## 2022-12-14 DIAGNOSIS — Z0000 Encounter for general adult medical examination without abnormal findings: Secondary | ICD-10-CM

## 2022-12-14 NOTE — Patient Instructions (Signed)
Mikayla [name] , Thank you for taking time to come for your Medicare Wellness Visit. I appreciate your ongoing commitment to your health goals. Please review the following plan we discussed and let me know if I can assist you in the future.   Screening recommendations/referrals: Colonoscopy N/A Mammogram N/A Bone Density  Recommended yearly ophthalmology/optometry visit for glaucoma screening and checkup Recommended yearly dental visit for hygiene and checkup  Vaccinations: Influenza vaccine- due annually in September/October Pneumococcal vaccine : Up to date  Tdap vaccine : Up to date  Shingles vaccine :     Advanced directives: Yes   Conditions/risks identified:  advanced age (>43men, >60 women);diabetes mellitus;hypertension;sedentary lifestyle  Next appointment: 1 year    Preventive Care 87 Years and Older, Female Preventive care refers to lifestyle choices and visits with your health care provider that can promote health and wellness. What does preventive care include? A yearly physical exam. This is also called an annual well check. Dental exams once or twice a year. Routine eye exams. Ask your health care provider how often you should have your eyes checked. Personal lifestyle choices, including: Daily care of your teeth and gums. Regular physical activity. Eating a healthy diet. Avoiding tobacco and drug use. Limiting alcohol use. Practicing safe sex. Taking low-dose aspirin every day. Taking vitamin and mineral supplements as recommended by your health care provider. What happens during an annual well check? The services and screenings done by your health care provider during your annual well check will depend on your age, overall health, lifestyle risk factors, and family history of disease. Counseling  Your health care provider may ask you questions about your: Alcohol use. Tobacco use. Drug use. Emotional well-being. Home and relationship well-being. Sexual  activity. Eating habits. History of falls. Memory and ability to understand (cognition). Work and work Astronomer. Reproductive health. Screening  You may have the following tests or measurements: Height, weight, and BMI. Blood pressure. Lipid and cholesterol levels. These may be checked every 5 years, or more frequently if you are over 87 years old. Skin check. Lung cancer screening. You may have this screening every year starting at age 87 if you have a 30-pack-year history of smoking and currently smoke or have quit within the past 15 years. Fecal occult blood test (FOBT) of the stool. You may have this test every year starting at age 87. Flexible sigmoidoscopy or colonoscopy. You may have a sigmoidoscopy every 5 years or a colonoscopy every 10 years starting at age 87. Hepatitis C blood test. Hepatitis B blood test. Sexually transmitted disease (STD) testing. Diabetes screening. This is done by checking your blood sugar (glucose) after you have not eaten for a while (fasting). You may have this done every 1-3 years. Bone density scan. This is done to screen for osteoporosis. You may have this done starting at age 87. Mammogram. This may be done every 1-2 years. Talk to your health care provider about how often you should have regular mammograms. Talk with your health care provider about your test results, treatment options, and if necessary, the need for more tests. Vaccines  Your health care provider may recommend certain vaccines, such as: Influenza vaccine. This is recommended every year. Tetanus, diphtheria, and acellular pertussis (Tdap, Td) vaccine. You may need a Td booster every 10 years. Zoster vaccine. You may need this after age 40. Pneumococcal 13-valent conjugate (PCV13) vaccine. One dose is recommended after age 87. Pneumococcal polysaccharide (PPSV23) vaccine. One dose is recommended after age 13. Talk  to your health care provider about which screenings and vaccines  you need and how often you need them. This information is not intended to replace advice given to you by your health care provider. Make sure you discuss any questions you have with your health care provider. Document Released: 05/28/2015 Document Revised: 01/19/2016 Document Reviewed: 03/02/2015 Elsevier Interactive Patient Education  2017 [employer].  Fall Prevention in the Home Falls can cause injuries. They can happen to people of all ages. There are many things you can do to make your home safe and to help prevent falls. What can I do on the outside of my home? Regularly fix the edges of walkways and driveways and fix any cracks. Remove anything that might make you trip as you walk through a door, such as a raised step or threshold. Trim any bushes or trees on the path to your home. Use bright outdoor lighting. Clear any walking paths of anything that might make someone trip, such as rocks or tools. Regularly check to see if handrails are loose or broken. Make sure that both sides of any steps have handrails. Any raised decks and porches should have guardrails on the edges. Have any leaves, snow, or ice cleared regularly. Use sand or salt on walking paths during winter. Clean up any spills in your garage right away. This includes oil or grease spills. What can I do in the bathroom? Use night lights. Install grab bars by the toilet and in the tub and shower. Do not use towel bars as grab bars. Use non-skid mats or decals in the tub or shower. If you need to sit down in the shower, use a plastic, non-slip stool. Keep the floor dry. Clean up any water that spills on the floor as soon as it happens. Remove soap buildup in the tub or shower regularly. Attach bath mats securely with double-sided non-slip rug tape. Do not have throw rugs and other things on the floor that can make you trip. What can I do in the bedroom? Use night lights. Make sure that you have a light by your bed that  is easy to reach. Do not use any sheets or blankets that are too big for your bed. They should not hang down onto the floor. Have a firm chair that has side arms. You can use this for support while you get dressed. Do not have throw rugs and other things on the floor that can make you trip. What can I do in the kitchen? Clean up any spills right away. Avoid walking on wet floors. Keep items that you use a lot in easy-to-reach places. If you need to reach something above you, use a strong step stool that has a grab bar. Keep electrical cords out of the way. Do not use floor polish or wax that makes floors slippery. If you must use wax, use non-skid floor wax. Do not have throw rugs and other things on the floor that can make you trip. What can I do with my stairs? Do not leave any items on the stairs. Make sure that there are handrails on both sides of the stairs and use them. Fix handrails that are broken or loose. Make sure that handrails are as long as the stairways. Check any carpeting to make sure that it is firmly attached to the stairs. Fix any carpet that is loose or worn. Avoid having throw rugs at the top or bottom of the stairs. If you do have throw rugs,  attach them to the floor with carpet tape. Make sure that you have a light switch at the top of the stairs and the bottom of the stairs. If you do not have them, ask someone to add them for you. What else can I do to help prevent falls? Wear shoes that: Do not have high heels. Have rubber bottoms. Are comfortable and fit you well. Are closed at the toe. Do not wear sandals. If you use a stepladder: Make sure that it is fully opened. Do not climb a closed stepladder. Make sure that both sides of the stepladder are locked into place. Ask someone to hold it for you, if possible. Clearly mark and make sure that you can see: Any grab bars or handrails. First and last steps. Where the edge of each step is. Use tools that help you  move around (mobility aids) if they are needed. These include: Canes. Walkers. Scooters. Crutches. Turn on the lights when you go into a dark area. Replace any light bulbs as soon as they burn out. Set up your furniture so you have a clear path. Avoid moving your furniture around. If any of your floors are uneven, fix them. If there are any pets around you, be aware of where they are. Review your medicines with your doctor. Some medicines can make you feel dizzy. This can increase your chance of falling. Ask your doctor what other things that you can do to help prevent falls. This information is not intended to replace advice given to you by your health care provider. Make sure you discuss any questions you have with your health care provider. Document Released: 02/25/2009 Document Revised: 10/07/2015 Document Reviewed: 06/05/2014 Elsevier Interactive Patient Education  2017 [employer].

## 2022-12-14 NOTE — Progress Notes (Signed)
Subjective:   Mikayla [name] is a 89 y.o. female who presents for Medicare Annual (Subsequent) preventive examination.  Visit Complete: In person  Patient Medicare AWV questionnaire was completed by the patient on 12/14/2022; I have confirmed that all information answered by patient is correct and no changes since this date.  Review of Systems     Cardiac Risk Factors include: advanced age (>2men, >62 women);diabetes mellitus;hypertension;sedentary lifestyle     Objective:    Today's Vitals   12/14/22 1105  BP: 128/64  Pulse: (!) 55  Resp: 18  Temp: 97.7 F (36.5 C)  SpO2: 98%  Weight: 117 lb (53.1 kg)  Height: 4\' 10"  (1.473 m)   Body mass index is 24.45 kg/m.     01/15/2022    7:15 PM 01/13/2022   11:05 AM 11/02/2021   12:59 PM 10/18/2021    9:56 AM 09/09/2021   10:37 AM 05/25/2019    1:23 AM 12/29/2014   10:00 AM  Advanced Directives  Does Patient Have a Medical Advance Directive? No Yes Yes Yes Yes No Yes  Type of Special educational needs teacher of [place];Living will Healthcare Power of [place];Living will Healthcare Power of [place];Living will Healthcare Power of [place];Living will  Living will  Does patient want to make changes to medical advance directive?  No - Patient declined No - Patient declined No - Patient declined No - Patient declined    Copy of Healthcare Power of Attorney in Chart?  No - copy requested No - copy requested No - copy requested No - copy requested    Would patient like information on creating a medical advance directive? No - Patient declined     No - Patient declined     Current Medications (verified) Outpatient Encounter Medications as of 12/14/2022  Medication Sig   acetaminophen (TYLENOL) 500 MG tablet Take 500 mg by mouth every 4 (four) hours as needed for mild pain or headache.   albuterol (VENTOLIN HFA) 108 (90 Base) MCG/ACT inhaler Inhale 2 puffs into the lungs every 6 (six) hours as needed for wheezing or shortness of  breath.   amLODipine (NORVASC) 10 MG tablet TAKE 1 TABLET BY MOUTH DAILY   bisoprolol (ZEBETA) 5 MG tablet Take 1 tablet (5 mg total) by mouth at bedtime.   budesonide-formoterol (SYMBICORT) 80-4.5 MCG/ACT inhaler Inhale 2 puffs into the lungs 2 (two) times daily as needed (shortness of breath/wheezing).   Cholecalciferol (VITAMIN D-3 PO) Take 1 capsule by mouth daily.   clopidogrel (PLAVIX) 75 MG tablet Take 1 tablet (75 mg total) by mouth daily.   dorzolamide-timolol (COSOPT) 22.3-6.8 MG/ML ophthalmic solution Place 1 drop into both eyes 2 (two) times daily.    ezetimibe (ZETIA) 10 MG tablet TAKE 1 TABLET BY MOUTH DAILY   furosemide (LASIX) 40 MG tablet Take 1 tablet (40 mg total) by mouth daily.   isosorbide dinitrate (ISORDIL) 30 MG tablet Take 0.5 tablets (15 mg total) by mouth 3 (three) times daily.   levothyroxine (SYNTHROID) 50 MCG tablet TAKE 1 TABLET BY MOUTH DAILY BEFORE BREAKFAST.   losartan (COZAAR) 25 MG tablet TAKE 1 TABLET BY MOUTH DAILY   metFORMIN (GLUCOPHAGE) 500 MG tablet TAKE 1 TABLET (500 MG TOTAL) BY MOUTH DAILY WITH BREAKFAST.   Multiple Vitamins-Minerals (CENTRUM SILVER PO) Take 1 tablet by mouth daily.   nitroGLYCERIN (NITROSTAT) 0.4 MG SL tablet Place 1 tablet (0.4 mg total) under the tongue every 5 (five) minutes as needed for chest pain.   hydrALAZINE (  APRESOLINE) 50 MG tablet Take 0.5 tablets (25 mg total) by mouth 3 (three) times daily.   No facility-administered encounter medications on file as of 12/14/2022.    Allergies (verified) Amoxil [amoxicillin], Isosorb dinitrate-hydralazine, Lipitor [atorvastatin], Motrin [ibuprofen], and Statins   History: Past Medical History:  Diagnosis Date   Chronic rhinitis    CKD (chronic kidney disease), stage III (HCC)    Disorder of bone and cartilage, unspecified    Essential hypertension, benign    Glaucoma    Per [facility] new patient packet   Hearing loss    Per [facility] new patient packet   High blood pressure    Per [facility]  new patient packet   High cholesterol    Per [facility] new patient packet   Obesity, unspecified    Other and unspecified hyperlipidemia    Type II or unspecified type diabetes mellitus without mention of complication, not stated as uncontrolled    Vision loss    Due to catarct Per [facility] new patient packet   Past Surgical History:  Procedure Laterality Date   CESAREAN SECTION N/A    CORONARY/GRAFT ACUTE MI REVASCULARIZATION N/A 01/15/2022   Procedure: Coronary/Graft Acute MI Revascularization;  Surgeon: Yates Decamp, MD;  Location: [facility];  Service: Cardiovascular;  Laterality: N/A;   ESOPHAGOGASTRODUODENOSCOPY N/A 04/29/2014   Procedure: ESOPHAGOGASTRODUODENOSCOPY (EGD);  Surgeon: Rachael Fee, MD;  Location: [facility];  Service: Endoscopy;  Laterality: N/A;   LEFT HEART CATH AND CORONARY ANGIOGRAPHY N/A 01/15/2022   Procedure: LEFT HEART CATH AND CORONARY ANGIOGRAPHY;  Surgeon: Yates Decamp, MD;  Location: [facility];  Service: Cardiovascular;  Laterality: N/A;   TOTAL ABDOMINAL HYSTERECTOMY     Family History  Problem Relation Age of Onset   COPD Mother    Lung cancer Father    Multiple myeloma Son    Prostate cancer Other    Colon cancer Neg Hx    Esophageal cancer Neg Hx    Rectal cancer Neg Hx    Stomach cancer Neg Hx    Social History   Socioeconomic History   Marital status: Married    Spouse name: Not on file   Number of children: 3   Years of education: Not on file   Highest education level: Not on file  Occupational History   Not on file  Tobacco Use   Smoking status: Never   Smokeless tobacco: Never  Vaping Use   Vaping status: Never Used  Substance and Sexual Activity   Alcohol use: Yes    Alcohol/week: 7.0 standard drinks of alcohol    Types: 7 Standard drinks or equivalent per week    Comment: wine with dinner   Drug use: No   Sexual activity: Not on file  Other Topics Concern   Not on file  Social History Narrative   Diet      Do you  drink/eat things with caffeine  YES      Marital Status Widowed  What year were you married?  1953      Do you live in a house, apartment, assisted living, condo, trailer, etc.?  House      Is it one or more stories?  One      How many persons live in your home?  1         Do you have any pets in your home?(please list)  No      Highest level of education completed:  High School  Current or past profession:  Insurance account manager      Do you exercise?:  NO    Type and how often:      Do you have a Living Will? (Form that indicates scenarios where you would not want your life prolonged)  YES      Do you have a DNR form?   YES      If not, would you like to discuss one?      Do you have signed POA/HPOA forms?  YES      Do you have difficulty bathing or dressing yourself?  NO      Do you have difficulty preparing food or eating?  NO      Do you have difficulty managing medications?  NO      Do you have difficulty managing your finances?  NO      Do you have difficulty affording your medications? NO                     Social Determinants of Corporate investment banker Strain: Not on file  Food Insecurity: Not on file  Transportation Needs: Not on file  Physical Activity: Not on file  Stress: Not on file  Social Connections: Not on file    Tobacco Counseling Counseling given: Not Answered   Clinical Intake:  Pre-visit preparation completed: No  Pain : No/denies pain     Nutritional Status: BMI of 19-24  Normal Nutritional Risks: None Diabetes: Yes CBG done?: No Did pt. bring in CBG monitor from home?: No  How often do you need to have someone help you when you read instructions, pamphlets, or other written materials from your doctor or pharmacy?: 3 - Sometimes What is the last grade level you completed in school?: 12 grade  Interpreter Needed?: No      Activities of Daily Living    12/14/2022   11:46 AM  In your present state of health, do  you have any difficulty performing the following activities:  Hearing? 1  Vision? 0  Walking or climbing stairs? 0  Dressing or bathing? 0  Doing errands, shopping? 1  Comment POA assist  Preparing Food and eating ? N  Using the Toilet? N  Do you have problems with loss of bowel control? N  Managing your Medications? N  Managing your Finances? N  Housekeeping or managing your Housekeeping? N    Patient Care Team: Sharon Seller, NP as PCP - General (Geriatric Medicine) Antony Contras, MD as Consulting Physician (Ophthalmology) Yates Decamp, MD as Consulting Physician (Cardiology)  Indicate any recent Medical Services you may have received from other than [facility] providers in the past year (date may be approximate).     Assessment:   This is a routine wellness examination for [place].  Hearing/Vision screen No results found.  Dietary issues and exercise activities discussed:     Goals Addressed             This Visit's Progress    Patient Stated       Live to 89 yrs        Depression Screen    07/14/2022   11:24 AM 10/18/2021    9:55 AM  PHQ 2/9 Scores  PHQ - 2 Score 0 0    Fall Risk    07/14/2022   11:23 AM 01/13/2022   11:05 AM 11/02/2021    2:44 PM 10/18/2021    9:55 AM 09/09/2021   10:48  AM  Fall Risk   Falls in the past year? 1 0 0 0 0  Number falls in past yr: 0 0 0 0 0  Injury with Fall? 0 0 0 0 0  Risk for fall due to : No Fall Risks No Fall Risks No Fall Risks No Fall Risks No Fall Risks  Follow up Falls evaluation completed Falls evaluation completed Falls evaluation completed Falls evaluation completed Falls evaluation completed    MEDICARE RISK AT HOME:  Medicare Risk at Home - 12/14/22 1149     Any stairs in or around the home? Yes    If so, are there any without handrails? Yes    Home free of loose throw rugs in walkways, pet beds, electrical cords, etc? Yes    Adequate lighting in your home to reduce risk of falls? Yes    Life alert? No     Use of a cane, walker or w/c? Yes    Grab bars in the bathroom? Yes    Shower chair or bench in shower? No    Elevated toilet seat or a handicapped toilet? No             TIMED UP AND GO:  Was the test performed?  Yes  Length of time to ambulate 10 feet: 10 sec Gait slow and steady with assistive device    Cognitive Function:        10/18/2021    9:57 AM  6CIT Screen  What Year? 0 points  What month? 0 points  What time? 0 points  Count back from 20 0 points  Months in reverse 0 points  Repeat phrase 0 points  Total Score 0 points    Immunizations Immunization History  Administered Date(s) Administered   Fluad Quad(high Dose 65+) 02/25/2019, 04/13/2020, 06/21/2021   Influenza Split 01/25/2011, 01/31/2012   Influenza Whole 02/28/2007, 04/02/2008, 03/12/2009, 02/10/2010   Influenza, High Dose Seasonal PF 02/01/2016, 03/13/2017, 01/29/2018   Influenza,inj,Quad PF,6+ Mos 04/02/2013, 03/03/2014, 04/20/2015, 03/28/2022   PFIZER(Purple Top)SARS-COV-2 Vaccination 06/19/2019, 07/14/2019   Pneumococcal Conjugate-13 03/03/2014   Pneumococcal Polysaccharide-23 06/04/2012   Pneumococcal-Unspecified 09/12/2000   Td 08/13/2006   Tdap 12/05/2016    TDAP status: Up to date  Flu Vaccine status: Up to date  Pneumococcal vaccine status: Up to date  Covid-19 vaccine status: Declined, Education has been provided regarding the importance of this vaccine but patient still declined. Advised may receive this vaccine at local pharmacy or Health Dept.or vaccine clinic. Aware to provide a copy of the vaccination record if obtained from local pharmacy or Health Dept. Verbalized acceptance and understanding.  Qualifies for Shingles Vaccine? No   Zostavax completed No   Shingrix Completed?: No.    Education has been provided regarding the importance of this vaccine. Patient has been advised to call insurance company to determine out of pocket expense if they have not yet received this  vaccine. Advised may also receive vaccine at local pharmacy or Health Dept. Verbalized acceptance and understanding.  Screening Tests Health Maintenance  Topic Date Due   FOOT EXAM  Never done   DEXA SCAN  Never done   COVID-19 Vaccine (3 - 2023-24 season) 01/13/2022   INFLUENZA VACCINE  12/14/2022   HEMOGLOBIN A1C  01/14/2023   OPHTHALMOLOGY EXAM  05/12/2023   Medicare Annual Wellness (AWV)  12/14/2023   DTaP/Tdap/Td (3 - Td or Tdap) 12/06/2026   Pneumonia Vaccine 23+ Years old  Completed   HPV VACCINES  Aged Out  Zoster Vaccines- Shingrix  Discontinued    Health Maintenance  Health Maintenance Due  Topic Date Due   FOOT EXAM  Never done   DEXA SCAN  Never done   COVID-19 Vaccine (3 - 2023-24 season) 01/13/2022   INFLUENZA VACCINE  12/14/2022    Colorectal cancer screening: No longer required.   Mammogram status: No longer required due to advance age.  Bone Density status: Ordered declined . Pt provided with contact info and advised to call to schedule appt.  Lung Cancer Screening: (Low Dose CT Chest recommended if Age 76-80 years, 20 pack-year currently smoking OR have quit w/in 15years.) does not qualify.   Lung Cancer Screening Referral: No   Additional Screening:  Hepatitis C Screening: does not qualify; Completed No   Vision Screening: Recommended annual ophthalmology exams for early detection of glaucoma and other disorders of the eye. Is the patient up to date with their annual eye exam?  Yes  Who is the provider or what is the name of the office in which the patient attends annual eye exams? Dr.Lyles If pt is not established with a provider, would they like to be referred to a provider to establish care? No .   Dental Screening: Recommended annual dental exams for proper oral hygiene  Diabetic Foot Exam: Diabetic Foot Exam: Completed Follows up with podiatrist   Community Resource Referral / Chronic Care Management: CRR required this visit?  No   CCM  required this visit?  No     Plan:     I have personally reviewed and noted the following in the patient's chart:   Medical and social history Use of alcohol, tobacco or illicit drugs  Current medications and supplements including opioid prescriptions. Patient is not currently taking opioid prescriptions. Functional ability and status Nutritional status Physical activity Advanced directives List of other physicians Hospitalizations, surgeries, and ER visits in previous 12 months Vitals Screenings to include cognitive, depression, and falls Referrals and appointments  In addition, I have reviewed and discussed with patient certain preventive protocols, quality metrics, and best practice recommendations. A written personalized care plan for preventive services as well as general preventive health recommendations were provided to patient.     Caesar Bookman, NP   12/14/2022   After Visit Summary: (Pick Up) Due to this being a telephonic visit, with patients personalized plan was offered to patient and patient has requested to Pick up at office.In person   Nurse Notes: declined COVID-19

## 2022-12-15 ENCOUNTER — Encounter: Payer: Medicare Other | Admitting: Nurse Practitioner

## 2022-12-21 ENCOUNTER — Other Ambulatory Visit: Payer: Self-pay | Admitting: Nurse Practitioner

## 2022-12-21 DIAGNOSIS — I219 Acute myocardial infarction, unspecified: Secondary | ICD-10-CM

## 2022-12-21 NOTE — Telephone Encounter (Signed)
Medication not refilled by PCP Sharon Seller, NP yet. Medication pend and sent to PCP.

## 2022-12-29 ENCOUNTER — Ambulatory Visit: Payer: Medicare Other | Admitting: Nurse Practitioner

## 2023-01-05 ENCOUNTER — Ambulatory Visit (INDEPENDENT_AMBULATORY_CARE_PROVIDER_SITE_OTHER): Payer: Medicare Other | Admitting: Family

## 2023-01-05 ENCOUNTER — Encounter: Payer: Self-pay | Admitting: Family

## 2023-01-05 VITALS — BP 121/88 | HR 54 | Temp 97.3°F | Resp 18 | Ht <= 58 in | Wt 114.6 lb

## 2023-01-05 DIAGNOSIS — E1122 Type 2 diabetes mellitus with diabetic chronic kidney disease: Secondary | ICD-10-CM | POA: Diagnosis not present

## 2023-01-05 DIAGNOSIS — R634 Abnormal weight loss: Secondary | ICD-10-CM

## 2023-01-05 DIAGNOSIS — J4520 Mild intermittent asthma, uncomplicated: Secondary | ICD-10-CM

## 2023-01-05 DIAGNOSIS — E039 Hypothyroidism, unspecified: Secondary | ICD-10-CM | POA: Diagnosis not present

## 2023-01-05 DIAGNOSIS — E1169 Type 2 diabetes mellitus with other specified complication: Secondary | ICD-10-CM | POA: Diagnosis not present

## 2023-01-05 DIAGNOSIS — E785 Hyperlipidemia, unspecified: Secondary | ICD-10-CM

## 2023-01-05 DIAGNOSIS — N1832 Chronic kidney disease, stage 3b: Secondary | ICD-10-CM

## 2023-01-05 DIAGNOSIS — I5032 Chronic diastolic (congestive) heart failure: Secondary | ICD-10-CM

## 2023-01-05 MED ORDER — ENSURE ACTIVE HIGH PROTEIN PO LIQD
1.0000 | ORAL | 5 refills | Status: DC
Start: 2023-01-05 — End: 2023-04-16

## 2023-01-05 NOTE — Progress Notes (Signed)
Provider: Ranetta Armacost FNP-C   Sharon Seller, NP  Patient Care Team: Sharon Seller, NP as PCP - General (Geriatric Medicine) Antony Contras, MD as Consulting Physician (Ophthalmology) Yates Decamp, MD as Consulting Physician (Cardiology)  Extended Emergency Contact Information Primary Emergency Contact: [facility] Phone: [phone] Relation: Daughter  Code Status:  Full Code  Goals of care: Advanced Directive information    01/15/2022    7:15 PM  Advanced Directives  Does Patient Have a Medical Advance Directive? No  Would patient like information on creating a medical advance directive? No - Patient declined     Chief Complaint  Patient presents with   Follow-up    4 mth fu general check up    Quality Metric Gaps    Needs foot exam, DG Bone Density, Covid and Influenza vaccine.    HPI:  Pt is a 89 y.o. female seen today for 4 months follow up for  medical management of chronic diseases.  CHF - follows up with cardiologist last seen by [facility] 10/24/2022.ECHO 55 -60% done 01/16/2022. She denies cough,chest tightness,chest pain,palpitation or shortness of breath. CAD - states ASA was discontinue by cardiologist and advised to continue on Plavix.on chart review she has completed her one year of dual antiplatelets therapy.   Type 3 DM - no home CBG for review.denies any signs of hypo/hyperglycemia.   No fall episode since last visit. Has had 3 lbs weight loss.   Past Medical History:  Diagnosis Date   Chronic rhinitis    CKD (chronic kidney disease), stage III (HCC)    Disorder of bone and cartilage, unspecified    Essential hypertension, benign    Glaucoma    Per [facility] new patient packet   Hearing loss    Per [facility] new patient packet   High blood pressure    Per [facility] new patient packet   High cholesterol    Per [facility] new patient packet   Obesity, unspecified    Other and unspecified hyperlipidemia    Type II or unspecified type diabetes  mellitus without mention of complication, not stated as uncontrolled    Vision loss    Due to catarct Per [facility] new patient packet   Past Surgical History:  Procedure Laterality Date   CESAREAN SECTION N/A    CORONARY/GRAFT ACUTE MI REVASCULARIZATION N/A 01/15/2022   Procedure: Coronary/Graft Acute MI Revascularization;  Surgeon: Yates Decamp, MD;  Location: [facility];  Service: Cardiovascular;  Laterality: N/A;   ESOPHAGOGASTRODUODENOSCOPY N/A 04/29/2014   Procedure: ESOPHAGOGASTRODUODENOSCOPY (EGD);  Surgeon: Rachael Fee, MD;  Location: [facility];  Service: Endoscopy;  Laterality: N/A;   LEFT HEART CATH AND CORONARY ANGIOGRAPHY N/A 01/15/2022   Procedure: LEFT HEART CATH AND CORONARY ANGIOGRAPHY;  Surgeon: Yates Decamp, MD;  Location: [facility];  Service: Cardiovascular;  Laterality: N/A;   TOTAL ABDOMINAL HYSTERECTOMY      Allergies  Allergen Reactions   Amoxil [Amoxicillin] Rash   Isosorb Dinitrate-Hydralazine Other (See Comments)    Severe dizziness, altered mental status   Lipitor [Atorvastatin] Other (See Comments)    Myalgias    Motrin [Ibuprofen] Other (See Comments)    Told to avoid due to kidney function   Statins Other (See Comments)    Myalgias     Allergies as of 01/05/2023       Reactions   Amoxil [amoxicillin] Rash   Isosorb Dinitrate-hydralazine Other (See Comments)   Severe dizziness, altered mental status   Lipitor [atorvastatin]  Other (See Comments)   Myalgias    Motrin [ibuprofen] Other (See Comments)   Told to avoid due to kidney function   Statins Other (See Comments)   Myalgias         Medication List        Accurate as of January 05, 2023 11:11 AM. If you have any questions, ask your nurse or doctor.          acetaminophen 500 MG tablet Commonly known as: TYLENOL Take 500 mg by mouth every 4 (four) hours as needed for mild pain or headache.   albuterol 108 (90 Base) MCG/ACT inhaler Commonly known as: VENTOLIN  HFA Inhale 2 puffs into the lungs every 6 (six) hours as needed for wheezing or shortness of breath.   amLODipine 10 MG tablet Commonly known as: NORVASC TAKE 1 TABLET BY MOUTH DAILY   bisoprolol 5 MG tablet Commonly known as: ZEBETA TAKE 1 TABLET BY MOUTH 2 TIMES DAILY.   budesonide-formoterol 80-4.5 MCG/ACT inhaler Commonly known as: SYMBICORT Inhale 2 puffs into the lungs 2 (two) times daily as needed (shortness of breath/wheezing).   CENTRUM SILVER PO Take 1 tablet by mouth daily.   clopidogrel 75 MG tablet Commonly known as: PLAVIX Take 1 tablet (75 mg total) by mouth daily.   dorzolamide-timolol 2-0.5 % ophthalmic solution Commonly known as: COSOPT Place 1 drop into both eyes 2 (two) times daily.   ezetimibe 10 MG tablet Commonly known as: ZETIA TAKE 1 TABLET BY MOUTH DAILY   furosemide 40 MG tablet Commonly known as: LASIX Take 1 tablet (40 mg total) by mouth daily.   hydrALAZINE 50 MG tablet Commonly known as: APRESOLINE Take 0.5 mg by mouth 3 (three) times daily. What changed: Another medication with the same name was removed. Continue taking this medication, and follow the directions you see here. Changed by: Donalee Citrin Tarin Johndrow   isosorbide dinitrate 30 MG tablet Commonly known as: ISORDIL Take 0.5 tablets (15 mg total) by mouth 3 (three) times daily.   levothyroxine 50 MCG tablet Commonly known as: SYNTHROID TAKE 1 TABLET BY MOUTH DAILY BEFORE BREAKFAST.   losartan 25 MG tablet Commonly known as: COZAAR TAKE 1 TABLET BY MOUTH DAILY   metFORMIN 500 MG tablet Commonly known as: GLUCOPHAGE TAKE 1 TABLET (500 MG TOTAL) BY MOUTH DAILY WITH BREAKFAST.   nitroGLYCERIN 0.4 MG SL tablet Commonly known as: NITROSTAT Place 1 tablet (0.4 mg total) under the tongue every 5 (five) minutes as needed for chest pain.   ofloxacin 0.3 % ophthalmic solution Commonly known as: OCUFLOX Place 1 drop into the left eye 4 (four) times daily.   VITAMIN D-3 PO Take 1  capsule by mouth daily.        Review of Systems  Constitutional:  Negative for appetite change, chills, fatigue, fever and unexpected weight change.  HENT:  Negative for congestion, dental problem, ear discharge, ear pain, facial swelling, hearing loss, nosebleeds, postnasal drip, rhinorrhea, sinus pressure, sinus pain, sneezing, sore throat, tinnitus and trouble swallowing.   Eyes:  Positive for visual disturbance. Negative for pain, discharge, redness and itching.       Wears eye glasses   Respiratory:  Negative for cough, chest tightness, shortness of breath and wheezing.   Cardiovascular:  Negative for chest pain, palpitations and leg swelling.  Gastrointestinal:  Negative for abdominal distention, abdominal pain, blood in stool, constipation, diarrhea, nausea and vomiting.  Endocrine: Negative for cold intolerance, heat intolerance, polydipsia, polyphagia and polyuria.  Genitourinary:  Negative for difficulty urinating, dysuria, flank pain, frequency and urgency.  Musculoskeletal:  Positive for gait problem. Negative for arthralgias, back pain, joint swelling, myalgias, neck pain and neck stiffness.  Skin:  Negative for color change, pallor, rash and wound.  Neurological:  Negative for dizziness, syncope, speech difficulty, weakness, light-headedness, numbness and headaches.  Hematological:  Does not bruise/bleed easily.  Psychiatric/Behavioral:  Negative for agitation, behavioral problems, confusion, hallucinations, self-injury, sleep disturbance and suicidal ideas. The patient is not nervous/anxious.     Immunization History  Administered Date(s) Administered   Fluad Quad(high Dose 65+) 02/25/2019, 04/13/2020, 06/21/2021   Influenza Split 01/25/2011, 01/31/2012   Influenza Whole 02/28/2007, 04/02/2008, 03/12/2009, 02/10/2010   Influenza, High Dose Seasonal PF 02/01/2016, 03/13/2017, 01/29/2018   Influenza,inj,Quad PF,6+ Mos 04/02/2013, 03/03/2014, 04/20/2015, 03/28/2022    PFIZER(Purple Top)SARS-COV-2 Vaccination 06/19/2019, 07/14/2019   Pneumococcal Conjugate-13 03/03/2014   Pneumococcal Polysaccharide-23 06/04/2012   Pneumococcal-Unspecified 09/12/2000   Td 08/13/2006   Tdap 12/05/2016   Pertinent  Health Maintenance Due  Topic Date Due   FOOT EXAM  Never done   DEXA SCAN  Never done   INFLUENZA VACCINE  12/14/2022   HEMOGLOBIN A1C  01/14/2023   OPHTHALMOLOGY EXAM  05/12/2023      01/16/2022    7:51 AM 01/16/2022    5:05 PM 01/16/2022    9:00 PM 07/14/2022   11:23 AM 01/05/2023   11:10 AM  Fall Risk  Falls in the past year?    1 0  Was there an injury with Fall?    0 0  Fall Risk Category Calculator    1 0  (RETIRED) Patient Fall Risk Level High fall risk High fall risk High fall risk    Patient at Risk for Falls Due to    No Fall Risks No Fall Risks  Fall risk Follow up    Falls evaluation completed Falls evaluation completed   Functional Status Survey:    Vitals:   01/05/23 1042  BP: 121/88  Pulse: (!) 54  Resp: 18  Temp: (!) 97.3 F (36.3 C)  SpO2: 99%  Weight: 114 lb 9.6 oz (52 kg)  Height: 4\' 10"  (1.473 m)   Body mass index is 23.95 kg/m. Physical Exam Vitals reviewed.  Constitutional:      General: She is not in acute distress.    Appearance: Normal appearance. She is normal weight. She is not ill-appearing or diaphoretic.  HENT:     Head: Normocephalic.     Right Ear: Tympanic membrane, ear canal and external ear normal. There is no impacted cerumen.     Left Ear: Tympanic membrane, ear canal and external ear normal. There is no impacted cerumen.     Nose: Nose normal. No congestion or rhinorrhea.     Mouth/Throat:     Mouth: Mucous membranes are moist.     Pharynx: Oropharynx is clear. No oropharyngeal exudate or posterior oropharyngeal erythema.  Eyes:     General: No scleral icterus.       Right eye: No discharge.        Left eye: No discharge.     Extraocular Movements: Extraocular movements intact.      Conjunctiva/sclera: Conjunctivae normal.     Pupils: Pupils are equal, round, and reactive to light.     Comments: Corrective lens in place   Neck:     Vascular: No carotid bruit.  Cardiovascular:     Rate and Rhythm: Normal rate and regular rhythm.     Pulses:  Normal pulses.     Heart sounds: Normal heart sounds. No murmur heard.    No friction rub. No gallop.  Pulmonary:     Effort: Pulmonary effort is normal. No respiratory distress.     Breath sounds: Normal breath sounds. No wheezing, rhonchi or rales.  Chest:     Chest wall: No tenderness.  Abdominal:     General: Bowel sounds are normal. There is no distension.     Palpations: Abdomen is soft. There is no mass.     Tenderness: There is no abdominal tenderness. There is no right CVA tenderness, left CVA tenderness, guarding or rebound.  Musculoskeletal:        General: No swelling or tenderness. Normal range of motion.     Cervical back: Normal range of motion. No rigidity or tenderness.     Right lower leg: No edema.     Left lower leg: No edema.  Lymphadenopathy:     Cervical: No cervical adenopathy.  Skin:    General: Skin is warm and dry.     Coloration: Skin is not pale.     Findings: No bruising, erythema, lesion or rash.  Neurological:     Mental Status: She is alert and oriented to person, place, and time.     Cranial Nerves: No cranial nerve deficit.     Sensory: No sensory deficit.     Motor: No weakness.     Coordination: Coordination normal.     Gait: Gait abnormal.     Comments: HOH   Psychiatric:        Mood and Affect: Mood normal.        Speech: Speech normal.        Behavior: Behavior normal.        Thought Content: Thought content normal.        Judgment: Judgment normal.    Labs reviewed: Recent Labs    01/16/22 0743 01/25/22 1240 04/25/22 1232  NA 130* 137 137  K 4.2 5.0 5.2  CL 98 102 106  CO2 21* 20 19*  GLUCOSE 120* 121* 127*  BUN 44* 54* 36  CREATININE 1.30* 1.47* 1.29*  CALCIUM  9.2 9.2 9.3  MG 2.2  --   --    Recent Labs    01/13/22 1133  AST 12  ALT 7  BILITOT 0.4  PROT 7.4   Recent Labs    01/13/22 1133 01/15/22 2351 01/16/22 0743 04/25/22 1232 07/14/22 1212  WBC 8.5   < > 14.7* 9.7 8.6  NEUTROABS 5,602  --   --   --  7,620  HGB 12.6   < > 11.2* 9.4* 10.4*  HCT 37.5   < > 31.9* 29.4* 32.0*  MCV 87.8   < > 84.2 93 87.9  PLT 239   < > 233 202 267   < > = values in this interval not displayed.   Lab Results  Component Value Date   TSH 0.75 07/14/2022   Lab Results  Component Value Date   HGBA1C 5.8 (H) 07/14/2022   Lab Results  Component Value Date   CHOL 291 (H) 06/21/2021   HDL 44.00 06/21/2021   LDLCALC 125 (H) 03/13/2017   LDLDIRECT 156.0 06/21/2021   TRIG 274.0 (H) 06/21/2021   CHOLHDL 7 06/21/2021    Significant Diagnostic Results in last 30 days:  No results found.  Assessment/Plan 1. Type 2 diabetes mellitus with stage 3b chronic kidney disease, without long-term current use of insulin (HCC)  Lab Results  Component Value Date   HGBA1C 5.8 (H) 07/14/2022  Well controlled  - continue on metformin  - continue on ARB - Not on statin due to allergy  - advised to continue to follow with opthalmology and podiatrist for annual eye and foot exam  - COMPLETE METABOLIC PANEL WITH GFR - CBC with Differential/Platelet - Hemoglobin A1c  2. Hyperlipidemia associated with type 2 diabetes mellitus (HCC) LDL not at goal  - continue on ezetimibe  - Lipid panel  3. Hypothyroidism, acquired Lab Results  Component Value Date   TSH 0.75 07/14/2022  - continue on levothyroxine 50 mcg tablet daily on empty stomach  - TSH  4. Mild intermittent extrinsic asthma without complication Breathing stable  - continue on Symbicort and albuterol   5. Stage 3b chronic kidney disease (HCC) CR stable 1.29 previous 1.47  - Will continue to avoid Nephrotoxins and dose all other medication for renal clearance  - COMPLETE METABOLIC PANEL WITH  GFR  6. Chronic diastolic heart failure (HCC) No signs of fluid overload  - check weight at least three times per week and notify provider for any abrupt weight gain > 3 lbs  - Reduce salt intake in diet  - COMPLETE METABOLIC PANEL WITH GFR - CBC with Differential/Platelet  7. Weight loss, abnormal Has had additional 3 lbs weight loss over - Nutritional Supplements (ENSURE ACTIVE HIGH PROTEIN) LIQD; Take 1 Bottle by mouth 3 (three) times a week.  Dispense: 237 mL; Refill: 5  Family/ staff Communication: Reviewed plan of care with patient and daughter verbalized understanding   Labs/tests ordered:  - COMPLETE METABOLIC PANEL WITH GFR - CBC with Differential/Platelet - Hemoglobin A1c - TSH  Next Appointment : Return in about 6 months (around 07/08/2023) for medical mangement of chronic issues.Caesar Bookman, NP

## 2023-01-06 LAB — CBC WITH DIFFERENTIAL/PLATELET
Absolute Monocytes: 672 {cells}/uL (ref 200–950)
Basophils Absolute: 7 {cells}/uL (ref 0–200)
Basophils Relative: 0.1 %
Eosinophils Absolute: 110 {cells}/uL (ref 15–500)
Eosinophils Relative: 1.5 %
HCT: 31 % — ABNORMAL LOW (ref 35.0–45.0)
Hemoglobin: 10 g/dL — ABNORMAL LOW (ref 11.7–15.5)
Lymphs Abs: 1475 {cells}/uL (ref 850–3900)
MCH: 29.5 pg (ref 27.0–33.0)
MCHC: 32.3 g/dL (ref 32.0–36.0)
MCV: 91.4 fL (ref 80.0–100.0)
MPV: 9.9 fL (ref 7.5–12.5)
Monocytes Relative: 9.2 %
Neutro Abs: 5037 {cells}/uL (ref 1500–7800)
Neutrophils Relative %: 69 %
Platelets: 201 10*3/uL (ref 140–400)
RBC: 3.39 10*6/uL — ABNORMAL LOW (ref 3.80–5.10)
RDW: 12.6 % (ref 11.0–15.0)
Total Lymphocyte: 20.2 %
WBC: 7.3 10*3/uL (ref 3.8–10.8)

## 2023-01-06 LAB — LIPID PANEL
Cholesterol: 171 mg/dL (ref <200)
HDL: 66 mg/dL (ref >=50)
LDL Cholesterol (Calc): 85 mg/dL
Non-HDL Cholesterol (Calc): 105 mg/dL (ref <130)
Total CHOL/HDL Ratio: 2.6 (calc) (ref <5.0)
Triglycerides: 100 mg/dL (ref <150)

## 2023-01-06 LAB — HEMOGLOBIN A1C
Hgb A1c MFr Bld: 6.1 %{Hb} — ABNORMAL HIGH (ref <5.7)
Mean Plasma Glucose: 128 mg/dL
eAG (mmol/L): 7.1 mmol/L

## 2023-01-06 LAB — COMPLETE METABOLIC PANEL WITH GFR
AG Ratio: 1.6 (calc) (ref 1.0–2.5)
ALT: 6 U/L (ref 6–29)
AST: 11 U/L (ref 10–35)
Albumin: 3.9 g/dL (ref 3.6–5.1)
Alkaline phosphatase (APISO): 31 U/L — ABNORMAL LOW (ref 37–153)
BUN/Creatinine Ratio: 41 (calc) — ABNORMAL HIGH (ref 6–22)
BUN: 74 mg/dL — ABNORMAL HIGH (ref 7–25)
CO2: 23 mmol/L (ref 20–32)
Calcium: 9.3 mg/dL (ref 8.6–10.4)
Chloride: 104 mmol/L (ref 98–110)
Creat: 1.79 mg/dL — ABNORMAL HIGH (ref 0.60–0.95)
Globulin: 2.5 g/dL (ref 1.9–3.7)
Glucose, Bld: 101 mg/dL (ref 65–139)
Potassium: 5 mmol/L (ref 3.5–5.3)
Sodium: 137 mmol/L (ref 135–146)
Total Bilirubin: 0.3 mg/dL (ref 0.2–1.2)
Total Protein: 6.4 g/dL (ref 6.1–8.1)
eGFR: 26 mL/min/{1.73_m2} — ABNORMAL LOW (ref >=60)

## 2023-01-06 LAB — TSH: TSH: 1.81 m[IU]/L (ref 0.40–4.50)

## 2023-01-11 ENCOUNTER — Other Ambulatory Visit: Payer: Self-pay | Admitting: Cardiology

## 2023-01-11 DIAGNOSIS — I2111 ST elevation (STEMI) myocardial infarction involving right coronary artery: Secondary | ICD-10-CM

## 2023-01-23 ENCOUNTER — Ambulatory Visit: Payer: Medicare Other | Admitting: Podiatry

## 2023-01-25 NOTE — Telephone Encounter (Signed)
This encounter was created in error - please disregard.

## 2023-02-15 ENCOUNTER — Ambulatory Visit: Payer: Medicare Other | Admitting: Podiatry

## 2023-02-15 ENCOUNTER — Encounter: Payer: Self-pay | Admitting: Podiatry

## 2023-02-15 DIAGNOSIS — M79675 Pain in left toe(s): Secondary | ICD-10-CM | POA: Diagnosis not present

## 2023-02-15 DIAGNOSIS — M79674 Pain in right toe(s): Secondary | ICD-10-CM

## 2023-02-15 DIAGNOSIS — E119 Type 2 diabetes mellitus without complications: Secondary | ICD-10-CM

## 2023-02-15 DIAGNOSIS — B351 Tinea unguium: Secondary | ICD-10-CM

## 2023-02-15 DIAGNOSIS — L84 Corns and callosities: Secondary | ICD-10-CM | POA: Diagnosis not present

## 2023-02-15 NOTE — Progress Notes (Signed)
Subjective:  Patient ID: Mikayla [name], female    DOB: [birth year],  MRN: [id number]  Chief Complaint  Patient presents with   Routine Foot Care    Diabetic foot care. Patient is hard of hearing    89 y.o. female presents with the above complaint. History confirmed with patient.  Last debridement was very helpful.  No new problems.  Calluses have thickened again but overall pain is improved since initiation of therapy  Objective:  Physical Exam: warm, good capillary refill, no trophic changes or ulcerative lesions, normal DP and PT pulses, normal monofilament exam, normal sensory exam, and varicose veins were noted. Left Foot: dystrophic yellowed discolored nail plates with subungual debris and submetatarsal 1 and 5 hyperkeratotic lesion tender to touch Right Foot: dystrophic yellowed discolored nail plates with subungual debris, submetatarsal 5 hyperkeratotic lesion tender to touch Assessment:   1. Pain due to onychomycosis of toenails of both feet   2. Callus of foot   3. Controlled type 2 diabetes mellitus without complication, without long-term current use of insulin (HCC)       Plan:  Patient was evaluated and treated and all questions answered.     Discussed the etiology and treatment options for the condition in detail with the patient.  Regular debridement has been helpful so far. Recommended debridement of the nails today. Sharp and mechanical debridement performed of all painful and mycotic nails today. Nails debrided in length and thickness using a nail nipper to level of comfort. Discussed treatment options including appropriate shoe gear. Follow up as needed for painful nails.   All symptomatic hyperkeratoses were safely debrided with a sterile #312 blade to patient's level of comfort without incident. We discussed preventative and palliative care of these lesions including supportive and accommodative shoegear, padding, prefabricated and custom molded accommodative  orthoses, use of a pumice stone and lotions/creams daily.   Return in about 11 weeks (around 05/03/2023) for at risk diabetic foot care.

## 2023-02-20 DIAGNOSIS — H353221 Exudative age-related macular degeneration, left eye, with active choroidal neovascularization: Secondary | ICD-10-CM | POA: Diagnosis not present

## 2023-03-12 ENCOUNTER — Other Ambulatory Visit: Payer: Self-pay | Admitting: Nurse Practitioner

## 2023-03-12 DIAGNOSIS — N1832 Chronic kidney disease, stage 3b: Secondary | ICD-10-CM

## 2023-03-12 NOTE — Telephone Encounter (Signed)
Patient medication has warnings. Medication pend and sent to PCP [place], Donalee Citrin, NP

## 2023-04-12 ENCOUNTER — Other Ambulatory Visit: Payer: Self-pay

## 2023-04-12 ENCOUNTER — Inpatient Hospital Stay (HOSPITAL_BASED_OUTPATIENT_CLINIC_OR_DEPARTMENT_OTHER)
Admission: EM | Admit: 2023-04-12 | Discharge: 2023-04-16 | DRG: 481 | Disposition: A | Discharge status: Skilled Nursing Facility | Payer: Medicare Other | Attending: Internal Medicine | Admitting: Internal Medicine

## 2023-04-12 ENCOUNTER — Encounter (HOSPITAL_BASED_OUTPATIENT_CLINIC_OR_DEPARTMENT_OTHER): Payer: Self-pay

## 2023-04-12 ENCOUNTER — Emergency Department (HOSPITAL_BASED_OUTPATIENT_CLINIC_OR_DEPARTMENT_OTHER): Payer: Medicare Other | Admitting: Radiology

## 2023-04-12 DIAGNOSIS — Z66 Do not resuscitate: Secondary | ICD-10-CM | POA: Diagnosis present

## 2023-04-12 DIAGNOSIS — Z801 Family history of malignant neoplasm of trachea, bronchus and lung: Secondary | ICD-10-CM

## 2023-04-12 DIAGNOSIS — N183 Chronic kidney disease, stage 3 unspecified: Secondary | ICD-10-CM | POA: Diagnosis not present

## 2023-04-12 DIAGNOSIS — M25552 Pain in left hip: Secondary | ICD-10-CM | POA: Diagnosis not present

## 2023-04-12 DIAGNOSIS — I5032 Chronic diastolic (congestive) heart failure: Secondary | ICD-10-CM | POA: Diagnosis not present

## 2023-04-12 DIAGNOSIS — D631 Anemia in chronic kidney disease: Secondary | ICD-10-CM | POA: Diagnosis present

## 2023-04-12 DIAGNOSIS — M79651 Pain in right thigh: Secondary | ICD-10-CM | POA: Diagnosis not present

## 2023-04-12 DIAGNOSIS — S72002A Fracture of unspecified part of neck of left femur, initial encounter for closed fracture: Secondary | ICD-10-CM | POA: Diagnosis not present

## 2023-04-12 DIAGNOSIS — W010XXA Fall on same level from slipping, tripping and stumbling without subsequent striking against object, initial encounter: Secondary | ICD-10-CM | POA: Diagnosis present

## 2023-04-12 DIAGNOSIS — S7222XA Displaced subtrochanteric fracture of left femur, initial encounter for closed fracture: Principal | ICD-10-CM | POA: Diagnosis present

## 2023-04-12 DIAGNOSIS — R457 State of emotional shock and stress, unspecified: Secondary | ICD-10-CM | POA: Diagnosis not present

## 2023-04-12 DIAGNOSIS — Z90710 Acquired absence of both cervix and uterus: Secondary | ICD-10-CM

## 2023-04-12 DIAGNOSIS — H547 Unspecified visual loss: Secondary | ICD-10-CM | POA: Diagnosis present

## 2023-04-12 DIAGNOSIS — Z886 Allergy status to analgesic agent status: Secondary | ICD-10-CM

## 2023-04-12 DIAGNOSIS — Z7189 Other specified counseling: Secondary | ICD-10-CM | POA: Diagnosis not present

## 2023-04-12 DIAGNOSIS — I252 Old myocardial infarction: Secondary | ICD-10-CM | POA: Diagnosis not present

## 2023-04-12 DIAGNOSIS — Z7401 Bed confinement status: Secondary | ICD-10-CM | POA: Diagnosis not present

## 2023-04-12 DIAGNOSIS — E871 Hypo-osmolality and hyponatremia: Secondary | ICD-10-CM | POA: Diagnosis present

## 2023-04-12 DIAGNOSIS — M6281 Muscle weakness (generalized): Secondary | ICD-10-CM | POA: Diagnosis not present

## 2023-04-12 DIAGNOSIS — Z7901 Long term (current) use of anticoagulants: Secondary | ICD-10-CM | POA: Diagnosis not present

## 2023-04-12 DIAGNOSIS — E039 Hypothyroidism, unspecified: Secondary | ICD-10-CM | POA: Diagnosis present

## 2023-04-12 DIAGNOSIS — Z7984 Long term (current) use of oral hypoglycemic drugs: Secondary | ICD-10-CM

## 2023-04-12 DIAGNOSIS — R2689 Other abnormalities of gait and mobility: Secondary | ICD-10-CM | POA: Diagnosis not present

## 2023-04-12 DIAGNOSIS — H9190 Unspecified hearing loss, unspecified ear: Secondary | ICD-10-CM | POA: Diagnosis present

## 2023-04-12 DIAGNOSIS — Z825 Family history of asthma and other chronic lower respiratory diseases: Secondary | ICD-10-CM | POA: Diagnosis not present

## 2023-04-12 DIAGNOSIS — Y92008 Other place in unspecified non-institutional (private) residence as the place of occurrence of the external cause: Secondary | ICD-10-CM

## 2023-04-12 DIAGNOSIS — I2510 Atherosclerotic heart disease of native coronary artery without angina pectoris: Secondary | ICD-10-CM | POA: Diagnosis present

## 2023-04-12 DIAGNOSIS — Z79899 Other long term (current) drug therapy: Secondary | ICD-10-CM

## 2023-04-12 DIAGNOSIS — D62 Acute posthemorrhagic anemia: Secondary | ICD-10-CM | POA: Diagnosis not present

## 2023-04-12 DIAGNOSIS — E785 Hyperlipidemia, unspecified: Secondary | ICD-10-CM | POA: Diagnosis not present

## 2023-04-12 DIAGNOSIS — Z807 Family history of other malignant neoplasms of lymphoid, hematopoietic and related tissues: Secondary | ICD-10-CM | POA: Diagnosis not present

## 2023-04-12 DIAGNOSIS — R4182 Altered mental status, unspecified: Secondary | ICD-10-CM | POA: Diagnosis not present

## 2023-04-12 DIAGNOSIS — E1122 Type 2 diabetes mellitus with diabetic chronic kidney disease: Secondary | ICD-10-CM | POA: Diagnosis present

## 2023-04-12 DIAGNOSIS — E876 Hypokalemia: Secondary | ICD-10-CM | POA: Diagnosis not present

## 2023-04-12 DIAGNOSIS — Z79890 Hormone replacement therapy (postmenopausal): Secondary | ICD-10-CM | POA: Diagnosis not present

## 2023-04-12 DIAGNOSIS — M2570 Osteophyte, unspecified joint: Secondary | ICD-10-CM | POA: Diagnosis not present

## 2023-04-12 DIAGNOSIS — E7800 Pure hypercholesterolemia, unspecified: Secondary | ICD-10-CM | POA: Diagnosis present

## 2023-04-12 DIAGNOSIS — K222 Esophageal obstruction: Secondary | ICD-10-CM | POA: Diagnosis not present

## 2023-04-12 DIAGNOSIS — I5033 Acute on chronic diastolic (congestive) heart failure: Secondary | ICD-10-CM | POA: Diagnosis not present

## 2023-04-12 DIAGNOSIS — N179 Acute kidney failure, unspecified: Secondary | ICD-10-CM | POA: Diagnosis not present

## 2023-04-12 DIAGNOSIS — K5900 Constipation, unspecified: Secondary | ICD-10-CM | POA: Diagnosis not present

## 2023-04-12 DIAGNOSIS — N1832 Chronic kidney disease, stage 3b: Secondary | ICD-10-CM | POA: Diagnosis present

## 2023-04-12 DIAGNOSIS — K921 Melena: Secondary | ICD-10-CM | POA: Diagnosis not present

## 2023-04-12 DIAGNOSIS — W19XXXA Unspecified fall, initial encounter: Secondary | ICD-10-CM | POA: Diagnosis not present

## 2023-04-12 DIAGNOSIS — Z955 Presence of coronary angioplasty implant and graft: Secondary | ICD-10-CM

## 2023-04-12 DIAGNOSIS — Z515 Encounter for palliative care: Secondary | ICD-10-CM | POA: Diagnosis not present

## 2023-04-12 DIAGNOSIS — D649 Anemia, unspecified: Secondary | ICD-10-CM | POA: Diagnosis not present

## 2023-04-12 DIAGNOSIS — K449 Diaphragmatic hernia without obstruction or gangrene: Secondary | ICD-10-CM | POA: Diagnosis not present

## 2023-04-12 DIAGNOSIS — Z880 Allergy status to penicillin: Secondary | ICD-10-CM

## 2023-04-12 DIAGNOSIS — I10 Essential (primary) hypertension: Secondary | ICD-10-CM | POA: Diagnosis not present

## 2023-04-12 DIAGNOSIS — Z9181 History of falling: Secondary | ICD-10-CM | POA: Diagnosis not present

## 2023-04-12 DIAGNOSIS — E875 Hyperkalemia: Secondary | ICD-10-CM | POA: Diagnosis present

## 2023-04-12 DIAGNOSIS — E1165 Type 2 diabetes mellitus with hyperglycemia: Secondary | ICD-10-CM | POA: Diagnosis present

## 2023-04-12 DIAGNOSIS — I130 Hypertensive heart and chronic kidney disease with heart failure and stage 1 through stage 4 chronic kidney disease, or unspecified chronic kidney disease: Secondary | ICD-10-CM | POA: Diagnosis not present

## 2023-04-12 DIAGNOSIS — G8911 Acute pain due to trauma: Secondary | ICD-10-CM | POA: Diagnosis not present

## 2023-04-12 DIAGNOSIS — Z7951 Long term (current) use of inhaled steroids: Secondary | ICD-10-CM

## 2023-04-12 DIAGNOSIS — M25511 Pain in right shoulder: Secondary | ICD-10-CM | POA: Diagnosis not present

## 2023-04-12 DIAGNOSIS — R2681 Unsteadiness on feet: Secondary | ICD-10-CM | POA: Diagnosis not present

## 2023-04-12 DIAGNOSIS — Z888 Allergy status to other drugs, medicaments and biological substances status: Secondary | ICD-10-CM

## 2023-04-12 DIAGNOSIS — R1311 Dysphagia, oral phase: Secondary | ICD-10-CM | POA: Diagnosis not present

## 2023-04-12 DIAGNOSIS — I129 Hypertensive chronic kidney disease with stage 1 through stage 4 chronic kidney disease, or unspecified chronic kidney disease: Secondary | ICD-10-CM | POA: Diagnosis not present

## 2023-04-12 DIAGNOSIS — S7222XD Displaced subtrochanteric fracture of left femur, subsequent encounter for closed fracture with routine healing: Secondary | ICD-10-CM | POA: Diagnosis not present

## 2023-04-12 DIAGNOSIS — Z7902 Long term (current) use of antithrombotics/antiplatelets: Secondary | ICD-10-CM | POA: Diagnosis not present

## 2023-04-12 DIAGNOSIS — R569 Unspecified convulsions: Secondary | ICD-10-CM | POA: Diagnosis not present

## 2023-04-12 LAB — CBC WITH DIFFERENTIAL/PLATELET
Abs Immature Granulocytes: 0.03 10*3/uL (ref 0.00–0.07)
Basophils Absolute: 0 10*3/uL (ref 0.0–0.1)
Basophils Relative: 0 %
Eosinophils Absolute: 0.1 10*3/uL (ref 0.0–0.5)
Eosinophils Relative: 1 %
HCT: 30.1 % — ABNORMAL LOW (ref 36.0–46.0)
Hemoglobin: 9.9 g/dL — ABNORMAL LOW (ref 12.0–15.0)
Immature Granulocytes: 0 %
Lymphocytes Relative: 17 %
Lymphs Abs: 1.7 10*3/uL (ref 0.7–4.0)
MCH: 30.5 pg (ref 26.0–34.0)
MCHC: 32.9 g/dL (ref 30.0–36.0)
MCV: 92.6 fL (ref 80.0–100.0)
Monocytes Absolute: 0.7 10*3/uL (ref 0.1–1.0)
Monocytes Relative: 8 %
Neutro Abs: 7 10*3/uL (ref 1.7–7.7)
Neutrophils Relative %: 74 %
Platelets: 210 10*3/uL (ref 150–400)
RBC: 3.25 MIL/uL — ABNORMAL LOW (ref 3.87–5.11)
RDW: 12.5 % (ref 11.5–15.5)
WBC: 9.7 10*3/uL (ref 4.0–10.5)
nRBC: 0 % (ref 0.0–0.2)

## 2023-04-12 LAB — COMPREHENSIVE METABOLIC PANEL
ALT: 7 U/L (ref 0–44)
AST: 12 U/L — ABNORMAL LOW (ref 15–41)
Albumin: 3.9 g/dL (ref 3.5–5.0)
Alkaline Phosphatase: 25 U/L — ABNORMAL LOW (ref 38–126)
Anion gap: 13 (ref 5–15)
BUN: 50 mg/dL — ABNORMAL HIGH (ref 8–23)
CO2: 21 mmol/L — ABNORMAL LOW (ref 22–32)
Calcium: 9.2 mg/dL (ref 8.9–10.3)
Chloride: 100 mmol/L (ref 98–111)
Creatinine, Ser: 1.68 mg/dL — ABNORMAL HIGH (ref 0.44–1.00)
GFR, Estimated: 28 mL/min — ABNORMAL LOW (ref >60)
Glucose, Bld: 162 mg/dL — ABNORMAL HIGH (ref 70–99)
Potassium: 5 mmol/L (ref 3.5–5.1)
Sodium: 134 mmol/L — ABNORMAL LOW (ref 135–145)
Total Bilirubin: 0.4 mg/dL (ref <1.2)
Total Protein: 6.7 g/dL (ref 6.5–8.1)

## 2023-04-12 LAB — GLUCOSE, CAPILLARY: Glucose-Capillary: 136 mg/dL — ABNORMAL HIGH (ref 70–99)

## 2023-04-12 MED ORDER — ACETAMINOPHEN 325 MG PO TABS
650.0000 mg | ORAL_TABLET | Freq: Four times a day (QID) | ORAL | Status: DC | PRN
  Administered 2023-04-13: 650 mg via ORAL
  Filled 2023-04-12: qty 2

## 2023-04-12 MED ORDER — HYDROMORPHONE HCL 1 MG/ML IJ SOLN
0.5000 mg | Freq: Once | INTRAMUSCULAR | Status: AC
  Administered 2023-04-12: 0.5 mg via INTRAVENOUS
  Filled 2023-04-12: qty 1

## 2023-04-12 MED ORDER — TRANEXAMIC ACID-NACL 1000-0.7 MG/100ML-% IV SOLN
1000.0000 mg | INTRAVENOUS | Status: AC
  Administered 2023-04-13: 1000 mg via INTRAVENOUS
  Filled 2023-04-12: qty 100

## 2023-04-12 MED ORDER — MORPHINE SULFATE (PF) 2 MG/ML IV SOLN
2.0000 mg | Freq: Once | INTRAVENOUS | Status: AC
  Administered 2023-04-12: 2 mg via INTRAVENOUS
  Filled 2023-04-12: qty 1

## 2023-04-12 MED ORDER — CHLORHEXIDINE GLUCONATE 4 % EX SOLN
60.0000 mL | Freq: Once | CUTANEOUS | Status: AC
  Administered 2023-04-13: 4 via TOPICAL

## 2023-04-12 MED ORDER — POLYETHYLENE GLYCOL 3350 17 G PO PACK: 17.0000 g | PACK | Freq: Every day | ORAL | Status: DC | PRN

## 2023-04-12 MED ORDER — MOMETASONE FURO-FORMOTEROL FUM 100-5 MCG/ACT IN AERO
2.0000 | INHALATION_SPRAY | Freq: Two times a day (BID) | RESPIRATORY_TRACT | Status: DC
  Filled 2023-04-12: qty 8.8

## 2023-04-12 MED ORDER — ALBUTEROL SULFATE (2.5 MG/3ML) 0.083% IN NEBU: 2.5000 mg | INHALATION_SOLUTION | Freq: Four times a day (QID) | RESPIRATORY_TRACT | Status: DC | PRN

## 2023-04-12 MED ORDER — INSULIN ASPART 100 UNIT/ML IJ SOLN
0.0000 [IU] | INTRAMUSCULAR | Status: DC
  Administered 2023-04-14: 1 [IU] via SUBCUTANEOUS

## 2023-04-12 MED ORDER — ONDANSETRON HCL 4 MG/2ML IJ SOLN
4.0000 mg | Freq: Once | INTRAMUSCULAR | Status: AC
  Administered 2023-04-12: 4 mg via INTRAVENOUS
  Filled 2023-04-12: qty 2

## 2023-04-12 MED ORDER — MELATONIN 5 MG PO TABS
5.0000 mg | ORAL_TABLET | Freq: Every evening | ORAL | Status: DC | PRN
  Administered 2023-04-13: 5 mg via ORAL
  Filled 2023-04-12 (×2): qty 1

## 2023-04-12 MED ORDER — POVIDONE-IODINE 10 % EX SWAB
2.0000 | Freq: Once | CUTANEOUS | Status: AC
  Administered 2023-04-13: 2 via TOPICAL

## 2023-04-12 MED ORDER — LEVOTHYROXINE SODIUM 50 MCG PO TABS
50.0000 ug | ORAL_TABLET | Freq: Every day | ORAL | Status: DC
  Administered 2023-04-14 – 2023-04-16 (×3): 50 ug via ORAL
  Filled 2023-04-12 (×3): qty 1

## 2023-04-12 MED ORDER — PROCHLORPERAZINE EDISYLATE 10 MG/2ML IJ SOLN: 5.0000 mg | Freq: Four times a day (QID) | INTRAMUSCULAR | Status: DC | PRN

## 2023-04-12 MED ORDER — CEFAZOLIN SODIUM-DEXTROSE 2-4 GM/100ML-% IV SOLN
2.0000 g | INTRAVENOUS | Status: AC
  Administered 2023-04-13: 2 g via INTRAVENOUS
  Filled 2023-04-12: qty 100

## 2023-04-12 MED ORDER — SODIUM CHLORIDE 0.9 % IV SOLN: INTRAVENOUS | Status: AC

## 2023-04-12 MED ORDER — EZETIMIBE 10 MG PO TABS
10.0000 mg | ORAL_TABLET | Freq: Every day | ORAL | Status: DC
  Administered 2023-04-14 – 2023-04-16 (×3): 10 mg via ORAL
  Filled 2023-04-12 (×3): qty 1

## 2023-04-12 MED ORDER — OFLOXACIN 0.3 % OP SOLN: 1.0000 [drp] | Freq: Four times a day (QID) | OPHTHALMIC | Status: DC

## 2023-04-12 MED ORDER — MOMETASONE FURO-FORMOTEROL FUM 100-5 MCG/ACT IN AERO: 2.0000 | INHALATION_SPRAY | Freq: Two times a day (BID) | RESPIRATORY_TRACT | Status: DC

## 2023-04-12 NOTE — ED Notes (Signed)
Report called to Malachi Bonds, Charity fundraiser at [facility]

## 2023-04-12 NOTE — ED Notes (Addendum)
Pt is unable to tolerate laying flat for xray due to pain. MD notified, will order meds.

## 2023-04-12 NOTE — Progress Notes (Signed)
89 y/o female with PMH of CAD on plavix has a left proximal femur subtroch fracture after a fall today.  She is posted for surgery tomorrow.  Please keep NPO after midnight, and don't start any blood thinners.  Please hold her plavix.  Full consult note in AM.

## 2023-04-12 NOTE — ED Notes (Signed)
[facility] transport team at bedside packaging pt for transport.

## 2023-04-12 NOTE — Progress Notes (Signed)
Hospitalist Transfer Note:    Nursing staff, Please call [facility] Admits & Consults System-Wide number on Amion [phone]) as soon as patient's arrival, so appropriate admitting provider can evaluate the pt.   Transferring facility: [facility] Requesting provider: Dr. Adela Lank (EDP at [facility]) Reason for transfer: admission for further evaluation and management of proximal left femur fracture after ground-level mechanical fall.   89 y.o. female who presented to [facility] ED complaining of acute left hip pain after ground-level mechanical fall earlier today, in which she tripped while attempting to ambulate.  Not associated with any loss of consciousness.  She is on daily Plavix as an outpatient, with most recent dose occurring on the morning of 04/12/2023.  Imaging notable for plain films of the pelvis/left femur showed evidence of proximal left femur fracture.  [facility] EDP d/w on-call [facility], Dr. Victorino Dike, who conveyed that [facility] will formally consult and see the patient in the morning at [facility], and requested the patient be made n.p.o. at midnight.  Subsequently, I accepted this patient for transfer for inpatient admission to a med-surg bed at [facility] for further work-up and management of the above.      Newton Pigg, DO Hospitalist

## 2023-04-12 NOTE — ED Notes (Signed)
Pt transported to xray via stretcher

## 2023-04-12 NOTE — ED Triage Notes (Signed)
Pt bib [facility] after witnessed fall, L leg shortening noted, pt unable to move L leg. Pt om plavix, denies hitting head.

## 2023-04-12 NOTE — ED Provider Notes (Signed)
[place] EMERGENCY DEPARTMENT AT [facility] Provider Note   CSN: [id number] Arrival date & time: 04/12/23  1832     History  Chief Complaint  Patient presents with   Marletta Lor    Mikayla [name] is a 89 y.o. female.  89 yo F with a complaint of a fall.  Patient said that she lost her balance landed on her left hip.  Complaining of pain to the area has been unable to walk since.  She is also thinks that she hurt her right shoulder.  She denies head injury denies loss of consciousness denies neck pain back pain chest pain abdominal pain.  She takes Plavix.   Fall       Home Medications Prior to Admission medications   Medication Sig Start Date End Date Taking? Authorizing Provider  acetaminophen (TYLENOL) 500 MG tablet Take 500 mg by mouth every 4 (four) hours as needed for mild pain or headache.    [provider]  albuterol (VENTOLIN HFA) 108 (90 Base) MCG/ACT inhaler Inhale 2 puffs into the lungs every 6 (six) hours as needed for wheezing or shortness of breath. 10/21/21   Sharon Seller, NP  amLODipine (NORVASC) 10 MG tablet TAKE 1 TABLET BY MOUTH DAILY 03/12/23   Ngetich, Dinah C, NP  bisoprolol (ZEBETA) 5 MG tablet TAKE 1 TABLET BY MOUTH 2 TIMES DAILY. 12/21/22   Sharon Seller, NP  budesonide-formoterol (SYMBICORT) 80-4.5 MCG/ACT inhaler Inhale 2 puffs into the lungs 2 (two) times daily as needed (shortness of breath/wheezing). 12/16/20   Parrett, Virgel Bouquet, NP  Cholecalciferol (VITAMIN D-3 PO) Take 1 capsule by mouth daily.    [provider]  clopidogrel (PLAVIX) 75 MG tablet TAKE 1 TABLET (75 MG TOTAL) BY MOUTH DAILY. 01/11/23 01/11/24  Yates Decamp, MD  dorzolamide-timolol (COSOPT) 22.3-6.8 MG/ML ophthalmic solution Place 1 drop into both eyes 2 (two) times daily.  04/18/19   [provider]  ezetimibe (ZETIA) 10 MG tablet TAKE 1 TABLET BY MOUTH DAILY 10/18/22   Yates Decamp, MD  furosemide (LASIX) 40 MG tablet Take 1 tablet (40 mg total)  by mouth daily. 07/14/22   Sharon Seller, NP  hydrALAZINE (APRESOLINE) 50 MG tablet Take 0.5 mg by mouth 3 (three) times daily.    [provider]  isosorbide dinitrate (ISORDIL) 30 MG tablet Take 0.5 tablets (15 mg total) by mouth 3 (three) times daily. 03/28/22   Yates Decamp, MD  levothyroxine (SYNTHROID) 50 MCG tablet TAKE 1 TABLET BY MOUTH DAILY BEFORE BREAKFAST. 05/10/22   Nyoka Cowden, MD  losartan (COZAAR) 25 MG tablet TAKE 1 TABLET BY MOUTH DAILY 10/10/22   Yates Decamp, MD  metFORMIN (GLUCOPHAGE) 500 MG tablet TAKE 1 TABLET (500 MG TOTAL) BY MOUTH DAILY WITH BREAKFAST. 03/12/23   Ngetich, Dinah C, NP  Multiple Vitamins-Minerals (CENTRUM SILVER PO) Take 1 tablet by mouth daily.    [provider]  nitroGLYCERIN (NITROSTAT) 0.4 MG SL tablet Place 1 tablet (0.4 mg total) under the tongue every 5 (five) minutes as needed for chest pain. 01/17/22   Yates Decamp, MD  Nutritional Supplements (ENSURE ACTIVE HIGH PROTEIN) LIQD Take 1 Bottle by mouth 3 (three) times a week. 01/05/23   Ngetich, Dinah C, NP  ofloxacin (OCUFLOX) 0.3 % ophthalmic solution Place 1 drop into the left eye 4 (four) times daily. 10/05/22   [provider]      Allergies    Amoxil [amoxicillin], Isosorb dinitrate-hydralazine, Lipitor [atorvastatin], Motrin [ibuprofen], and Statins  Review of Systems   Review of Systems  Physical Exam Updated Vital Signs BP (!) 135/52   Pulse 63   Temp 97.9 F (36.6 C) (Oral)   Resp 17   Ht 4\' 11"  (1.499 m)   Wt 52.2 kg   SpO2 96%   BMI 23.23 kg/m  Physical Exam Vitals and nursing note reviewed.  Constitutional:      General: She is not in acute distress.    Appearance: She is well-developed. She is not diaphoretic.  HENT:     Head: Normocephalic and atraumatic.  Eyes:     Pupils: Pupils are equal, round, and reactive to light.  Cardiovascular:     Rate and Rhythm: Normal rate and regular rhythm.     Heart sounds: No murmur heard.    No friction  rub. No gallop.  Pulmonary:     Effort: Pulmonary effort is normal.     Breath sounds: No wheezing or rales.  Abdominal:     General: There is no distension.     Palpations: Abdomen is soft.     Tenderness: There is no abdominal tenderness.  Musculoskeletal:        General: Tenderness present.     Cervical back: Normal range of motion and neck supple.     Comments: Pain with internal/external rotation of the left lower extremity.  Pulse motor and sensation is intact distally.  The left leg is shortened and externally rotated.  Pelvis is stable to compression without obvious discomfort.  No obvious pain to the knee.  She has some pain diffusely about the right shoulder.  She points to the proximal humerus as the areas of most pain.  No obvious midline spinal tenderness develops or deformities.  Able to rotate her head 45 degrees in either direction without discomfort.  Palpated from head to toe without any other obvious noted areas of bony tenderness.  Skin:    General: Skin is warm and dry.  Neurological:     Mental Status: She is alert and oriented to person, place, and time.  Psychiatric:        Behavior: Behavior normal.     ED Results / Procedures / Treatments   Labs (all labs ordered are listed, but only abnormal results are displayed) Labs Reviewed  CBC WITH DIFFERENTIAL/PLATELET - Abnormal; Notable for the following components:      Result Value   RBC 3.25 (*)    Hemoglobin 9.9 (*)    HCT 30.1 (*)    All other components within normal limits  COMPREHENSIVE METABOLIC PANEL - Abnormal; Notable for the following components:   Sodium 134 (*)    CO2 21 (*)    Glucose, Bld 162 (*)    BUN 50 (*)    Creatinine, Ser 1.68 (*)    AST 12 (*)    Alkaline Phosphatase 25 (*)    GFR, Estimated 28 (*)    All other components within normal limits    EKG EKG Interpretation Date/Time:  Thursday April 12 2023 19:17:05 EST Ventricular Rate:  54 PR Interval:  335 QRS  Duration:  91 QT Interval:  483 QTC Calculation: 458 R Axis:   -39  Text Interpretation: Sinus or ectopic atrial rhythm Prolonged PR interval Inferior infarct, old Anteroseptal infarct, old No significant change since last tracing Confirmed by Melene Plan [id number]) on 04/12/2023 7:26:12 PM  Radiology DG Chest Port 1 View  Result Date: 04/12/2023 CLINICAL DATA:  [id number]. Witnessed fall injury, left lower extremity  shortening and pain, unable to move left lower extremity. EXAM: LEFT FEMUR 2 VIEWS; PORTABLE CHEST - 1 VIEW; RIGHT SHOULDER - 2+ VIEW; PELVIS - 1-2 VIEW COMPARISON:  PA and lateral chest 07/14/2022, and CT abdomen and pelvis and reformats 06/24/2008 FINDINGS: Chest AP portable 8:05 p.m.: The heart is mildly enlarged. No vascular congestion is seen. The aorta is tortuous and calcified with stable mediastinum. The lungs are clear. No displaced rib fracture or pneumothorax are seen. Osteopenia, degenerative change and mild levoscoliosis thoracic spine. No new osseous findings. There is a tangle of overlying monitor wiring. Right shoulder, AP and transscapular Y-views only: Osteopenia. There is no evidence of fracture or dislocation. There are degenerative subcortical cystic changes along the humeral cuff insertion, slight spurring at the [facility] joint and inferior glenohumeral joint. There is a somewhat high-riding appearance of the humerus which could be positional or due to chronic rotator cuff arthropathy. AP pelvis single view: Osteopenia. There is an acute oblique subtrochanteric proximal left femoral fracture, with mild medial angulation of the main distal fragment and less than 1 cortex width lateral displacement. There is a butterfly comminution fragment at the lateral proximal fracture margin which is somewhat laterally rotated. The pelvic rings are intact AP. No pelvic fracture or diastasis is seen. There are osteophytes of the SI joints, pubic symphysis. The visualized proximal right femur is  unremarkable. There is mild arthrosis of both hips, pelvic enthesopathy. The iliofemoral arteries are heavily calcified. There is advanced degenerative change of the visualized lower lumbar spine. Multiple pelvic phleboliths. AP and cross-table lateral views left femur, total of 4 films: Above-described proximal femoral fracture is redemonstrated, with mild angulation and slight displacement. No fracture is seen in the remainder of the femur. There is moderate arthrosis at the knee. No suprapatellar effusion is evident. Heavy calcification continues down the superficial femoral artery and into the popliteal and popliteal trifurcation arteries. IMPRESSION: 1. Acute oblique subtrochanteric proximal left femoral fracture with mild angulation and slight displacement. 2. No fracture is seen in the remainder of the left femur. 3. No pelvic fracture or diastasis is seen. 4. No acute radiographic chest findings. Mild cardiomegaly. 5. Osteopenia and degenerative change right shoulder without evidence of fracture. 6. Heavy aortic and peripheral vascular atherosclerosis. Electronically Signed   By: Almira Bar M.D.   On: 04/12/2023 20:46   DG Shoulder Right  Result Date: 04/12/2023 CLINICAL DATA:  [id number]. Witnessed fall injury, left lower extremity shortening and pain, unable to move left lower extremity. EXAM: LEFT FEMUR 2 VIEWS; PORTABLE CHEST - 1 VIEW; RIGHT SHOULDER - 2+ VIEW; PELVIS - 1-2 VIEW COMPARISON:  PA and lateral chest 07/14/2022, and CT abdomen and pelvis and reformats 06/24/2008 FINDINGS: Chest AP portable 8:05 p.m.: The heart is mildly enlarged. No vascular congestion is seen. The aorta is tortuous and calcified with stable mediastinum. The lungs are clear. No displaced rib fracture or pneumothorax are seen. Osteopenia, degenerative change and mild levoscoliosis thoracic spine. No new osseous findings. There is a tangle of overlying monitor wiring. Right shoulder, AP and transscapular Y-views only:  Osteopenia. There is no evidence of fracture or dislocation. There are degenerative subcortical cystic changes along the humeral cuff insertion, slight spurring at the [facility] joint and inferior glenohumeral joint. There is a somewhat high-riding appearance of the humerus which could be positional or due to chronic rotator cuff arthropathy. AP pelvis single view: Osteopenia. There is an acute oblique subtrochanteric proximal left femoral fracture, with mild medial angulation of the main  distal fragment and less than 1 cortex width lateral displacement. There is a butterfly comminution fragment at the lateral proximal fracture margin which is somewhat laterally rotated. The pelvic rings are intact AP. No pelvic fracture or diastasis is seen. There are osteophytes of the SI joints, pubic symphysis. The visualized proximal right femur is unremarkable. There is mild arthrosis of both hips, pelvic enthesopathy. The iliofemoral arteries are heavily calcified. There is advanced degenerative change of the visualized lower lumbar spine. Multiple pelvic phleboliths. AP and cross-table lateral views left femur, total of 4 films: Above-described proximal femoral fracture is redemonstrated, with mild angulation and slight displacement. No fracture is seen in the remainder of the femur. There is moderate arthrosis at the knee. No suprapatellar effusion is evident. Heavy calcification continues down the superficial femoral artery and into the popliteal and popliteal trifurcation arteries. IMPRESSION: 1. Acute oblique subtrochanteric proximal left femoral fracture with mild angulation and slight displacement. 2. No fracture is seen in the remainder of the left femur. 3. No pelvic fracture or diastasis is seen. 4. No acute radiographic chest findings. Mild cardiomegaly. 5. Osteopenia and degenerative change right shoulder without evidence of fracture. 6. Heavy aortic and peripheral vascular atherosclerosis. Electronically Signed   By:  Almira Bar M.D.   On: 04/12/2023 20:46   DG Pelvis 1-2 Views  Result Date: 04/12/2023 CLINICAL DATA:  [id number]. Witnessed fall injury, left lower extremity shortening and pain, unable to move left lower extremity. EXAM: LEFT FEMUR 2 VIEWS; PORTABLE CHEST - 1 VIEW; RIGHT SHOULDER - 2+ VIEW; PELVIS - 1-2 VIEW COMPARISON:  PA and lateral chest 07/14/2022, and CT abdomen and pelvis and reformats 06/24/2008 FINDINGS: Chest AP portable 8:05 p.m.: The heart is mildly enlarged. No vascular congestion is seen. The aorta is tortuous and calcified with stable mediastinum. The lungs are clear. No displaced rib fracture or pneumothorax are seen. Osteopenia, degenerative change and mild levoscoliosis thoracic spine. No new osseous findings. There is a tangle of overlying monitor wiring. Right shoulder, AP and transscapular Y-views only: Osteopenia. There is no evidence of fracture or dislocation. There are degenerative subcortical cystic changes along the humeral cuff insertion, slight spurring at the [facility] joint and inferior glenohumeral joint. There is a somewhat high-riding appearance of the humerus which could be positional or due to chronic rotator cuff arthropathy. AP pelvis single view: Osteopenia. There is an acute oblique subtrochanteric proximal left femoral fracture, with mild medial angulation of the main distal fragment and less than 1 cortex width lateral displacement. There is a butterfly comminution fragment at the lateral proximal fracture margin which is somewhat laterally rotated. The pelvic rings are intact AP. No pelvic fracture or diastasis is seen. There are osteophytes of the SI joints, pubic symphysis. The visualized proximal right femur is unremarkable. There is mild arthrosis of both hips, pelvic enthesopathy. The iliofemoral arteries are heavily calcified. There is advanced degenerative change of the visualized lower lumbar spine. Multiple pelvic phleboliths. AP and cross-table lateral views left  femur, total of 4 films: Above-described proximal femoral fracture is redemonstrated, with mild angulation and slight displacement. No fracture is seen in the remainder of the femur. There is moderate arthrosis at the knee. No suprapatellar effusion is evident. Heavy calcification continues down the superficial femoral artery and into the popliteal and popliteal trifurcation arteries. IMPRESSION: 1. Acute oblique subtrochanteric proximal left femoral fracture with mild angulation and slight displacement. 2. No fracture is seen in the remainder of the left femur. 3. No pelvic fracture or  diastasis is seen. 4. No acute radiographic chest findings. Mild cardiomegaly. 5. Osteopenia and degenerative change right shoulder without evidence of fracture. 6. Heavy aortic and peripheral vascular atherosclerosis. Electronically Signed   By: Almira Bar M.D.   On: 04/12/2023 20:46   DG FEMUR MIN 2 VIEWS LEFT  Result Date: 04/12/2023 CLINICAL DATA:  [id number]. Witnessed fall injury, left lower extremity shortening and pain, unable to move left lower extremity. EXAM: LEFT FEMUR 2 VIEWS; PORTABLE CHEST - 1 VIEW; RIGHT SHOULDER - 2+ VIEW; PELVIS - 1-2 VIEW COMPARISON:  PA and lateral chest 07/14/2022, and CT abdomen and pelvis and reformats 06/24/2008 FINDINGS: Chest AP portable 8:05 p.m.: The heart is mildly enlarged. No vascular congestion is seen. The aorta is tortuous and calcified with stable mediastinum. The lungs are clear. No displaced rib fracture or pneumothorax are seen. Osteopenia, degenerative change and mild levoscoliosis thoracic spine. No new osseous findings. There is a tangle of overlying monitor wiring. Right shoulder, AP and transscapular Y-views only: Osteopenia. There is no evidence of fracture or dislocation. There are degenerative subcortical cystic changes along the humeral cuff insertion, slight spurring at the [facility] joint and inferior glenohumeral joint. There is a somewhat high-riding appearance of the  humerus which could be positional or due to chronic rotator cuff arthropathy. AP pelvis single view: Osteopenia. There is an acute oblique subtrochanteric proximal left femoral fracture, with mild medial angulation of the main distal fragment and less than 1 cortex width lateral displacement. There is a butterfly comminution fragment at the lateral proximal fracture margin which is somewhat laterally rotated. The pelvic rings are intact AP. No pelvic fracture or diastasis is seen. There are osteophytes of the SI joints, pubic symphysis. The visualized proximal right femur is unremarkable. There is mild arthrosis of both hips, pelvic enthesopathy. The iliofemoral arteries are heavily calcified. There is advanced degenerative change of the visualized lower lumbar spine. Multiple pelvic phleboliths. AP and cross-table lateral views left femur, total of 4 films: Above-described proximal femoral fracture is redemonstrated, with mild angulation and slight displacement. No fracture is seen in the remainder of the femur. There is moderate arthrosis at the knee. No suprapatellar effusion is evident. Heavy calcification continues down the superficial femoral artery and into the popliteal and popliteal trifurcation arteries. IMPRESSION: 1. Acute oblique subtrochanteric proximal left femoral fracture with mild angulation and slight displacement. 2. No fracture is seen in the remainder of the left femur. 3. No pelvic fracture or diastasis is seen. 4. No acute radiographic chest findings. Mild cardiomegaly. 5. Osteopenia and degenerative change right shoulder without evidence of fracture. 6. Heavy aortic and peripheral vascular atherosclerosis. Electronically Signed   By: Almira Bar M.D.   On: 04/12/2023 20:46    Procedures Procedures    Medications Ordered in ED Medications  morphine (PF) 2 MG/ML injection 2 mg (2 mg Intravenous Given 04/12/23 1901)  ondansetron (ZOFRAN) injection 4 mg (4 mg Intravenous Given  04/12/23 1901)  HYDROmorphone (DILAUDID) injection 0.5 mg (0.5 mg Intravenous Given 04/12/23 1939)  HYDROmorphone (DILAUDID) injection 0.5 mg (0.5 mg Intravenous Given 04/12/23 2217)    ED Course/ Medical Decision Making/ A&P                                 Medical Decision Making Amount and/or Complexity of Data Reviewed Labs: ordered. Radiology: ordered. ECG/medicine tests: ordered.  Risk Prescription drug management. Decision regarding hospitalization.   89 yo F  with a chief complaints of a fall from standing.  Nonsyncopal by history complaining mostly of left hip pain.  Will concern the patient has a hip fracture clinically on initial exam.  Will obtain a plain film.  Chest x-ray blood work.  EKG.  No significant anemia, no significant electrolyte abnormality.  Plain film of the left femur independently interpreted by me with a proximal femur fracture just inferior to the trochanters.  I discussed this with Dr. Victorino Dike recommends n.p.o. at midnight and would be seen by orthopedics in the morning.  Plain film of the right shoulder independently interpreted by me without fracture or dislocation.  Plain film of the chest without focal infiltrate or pneumothorax on my independent interpretation.  The patients results and plan were reviewed and discussed.   Any x-rays performed were independently reviewed by myself.   Differential diagnosis were considered with the presenting HPI.  Medications  morphine (PF) 2 MG/ML injection 2 mg (2 mg Intravenous Given 04/12/23 1901)  ondansetron (ZOFRAN) injection 4 mg (4 mg Intravenous Given 04/12/23 1901)  HYDROmorphone (DILAUDID) injection 0.5 mg (0.5 mg Intravenous Given 04/12/23 1939)  HYDROmorphone (DILAUDID) injection 0.5 mg (0.5 mg Intravenous Given 04/12/23 2217)    Vitals:   04/12/23 2045 04/12/23 2115 04/12/23 2159 04/12/23 2200  BP: (!) 139/44 (!) 119/49  (!) 135/52  Pulse: 61 (!) 57  63  Resp: (!) 21 16 17    Temp:       TempSrc:      SpO2: 95% 96%  96%  Weight:      Height:        Final diagnoses:  Closed displaced subtrochanteric fracture of left femur, initial encounter (HCC)    Admission/ observation were discussed with the admitting physician, patient and/or family and they are comfortable with the plan.          Final Clinical Impression(s) / ED Diagnoses Final diagnoses:  Closed displaced subtrochanteric fracture of left femur, initial encounter [facility])    Rx / DC Orders ED Discharge Orders     None         Melene Plan, DO 04/12/23 2231

## 2023-04-12 NOTE — ED Notes (Signed)
Pt request pain medication prior to transport, MD notified.

## 2023-04-13 ENCOUNTER — Other Ambulatory Visit: Payer: Self-pay

## 2023-04-13 ENCOUNTER — Inpatient Hospital Stay (HOSPITAL_COMMUNITY): Payer: Medicare Other

## 2023-04-13 ENCOUNTER — Inpatient Hospital Stay (HOSPITAL_COMMUNITY): Payer: Medicare Other | Admitting: Anesthesiology

## 2023-04-13 ENCOUNTER — Encounter (HOSPITAL_COMMUNITY): Payer: Self-pay | Admitting: Internal Medicine

## 2023-04-13 ENCOUNTER — Encounter (HOSPITAL_COMMUNITY)
Admission: EM | Disposition: A | Discharge status: Skilled Nursing Facility | Payer: Self-pay | Source: Home / Self Care | Attending: Internal Medicine

## 2023-04-13 DIAGNOSIS — S7222XA Displaced subtrochanteric fracture of left femur, initial encounter for closed fracture: Principal | ICD-10-CM

## 2023-04-13 DIAGNOSIS — S72002A Fracture of unspecified part of neck of left femur, initial encounter for closed fracture: Secondary | ICD-10-CM

## 2023-04-13 DIAGNOSIS — Z7189 Other specified counseling: Secondary | ICD-10-CM

## 2023-04-13 HISTORY — PX: INTRAMEDULLARY (IM) NAIL INTERTROCHANTERIC: SHX5875

## 2023-04-13 LAB — BASIC METABOLIC PANEL
Anion gap: 10 (ref 5–15)
BUN: 52 mg/dL — ABNORMAL HIGH (ref 8–23)
CO2: 20 mmol/L — ABNORMAL LOW (ref 22–32)
Calcium: 8.8 mg/dL — ABNORMAL LOW (ref 8.9–10.3)
Chloride: 103 mmol/L (ref 98–111)
Creatinine, Ser: 1.75 mg/dL — ABNORMAL HIGH (ref 0.44–1.00)
GFR, Estimated: 27 mL/min — ABNORMAL LOW (ref >60)
Glucose, Bld: 130 mg/dL — ABNORMAL HIGH (ref 70–99)
Potassium: 5.7 mmol/L — ABNORMAL HIGH (ref 3.5–5.1)
Sodium: 133 mmol/L — ABNORMAL LOW (ref 135–145)

## 2023-04-13 LAB — CBC
HCT: 26.7 % — ABNORMAL LOW (ref 36.0–46.0)
HCT: 25.9 % — ABNORMAL LOW (ref 36.0–46.0)
Hemoglobin: 8.7 g/dL — ABNORMAL LOW (ref 12.0–15.0)
Hemoglobin: 8.3 g/dL — ABNORMAL LOW (ref 12.0–15.0)
MCH: 30.2 pg (ref 26.0–34.0)
MCH: 30.3 pg (ref 26.0–34.0)
MCHC: 32.6 g/dL (ref 30.0–36.0)
MCHC: 32 g/dL (ref 30.0–36.0)
MCV: 92.7 fL (ref 80.0–100.0)
MCV: 94.5 fL (ref 80.0–100.0)
Platelets: 167 10*3/uL (ref 150–400)
Platelets: 172 10*3/uL (ref 150–400)
RBC: 2.88 MIL/uL — ABNORMAL LOW (ref 3.87–5.11)
RBC: 2.74 MIL/uL — ABNORMAL LOW (ref 3.87–5.11)
RDW: 12.4 % (ref 11.5–15.5)
RDW: 12.6 % (ref 11.5–15.5)
WBC: 10.8 10*3/uL — ABNORMAL HIGH (ref 4.0–10.5)
WBC: 10.4 10*3/uL (ref 4.0–10.5)
nRBC: 0 % (ref 0.0–0.2)
nRBC: 0 % (ref 0.0–0.2)

## 2023-04-13 LAB — GLUCOSE, CAPILLARY
Glucose-Capillary: 112 mg/dL — ABNORMAL HIGH (ref 70–99)
Glucose-Capillary: 118 mg/dL — ABNORMAL HIGH (ref 70–99)
Glucose-Capillary: 119 mg/dL — ABNORMAL HIGH (ref 70–99)
Glucose-Capillary: 127 mg/dL — ABNORMAL HIGH (ref 70–99)
Glucose-Capillary: 127 mg/dL — ABNORMAL HIGH (ref 70–99)
Glucose-Capillary: 139 mg/dL — ABNORMAL HIGH (ref 70–99)

## 2023-04-13 LAB — CREATININE, SERUM
Creatinine, Ser: 1.88 mg/dL — ABNORMAL HIGH (ref 0.44–1.00)
GFR, Estimated: 24 mL/min — ABNORMAL LOW (ref >60)

## 2023-04-13 LAB — PHOSPHORUS: Phosphorus: 4.9 mg/dL — ABNORMAL HIGH (ref 2.5–4.6)

## 2023-04-13 LAB — MAGNESIUM: Magnesium: 2.3 mg/dL (ref 1.7–2.4)

## 2023-04-13 LAB — SURGICAL PCR SCREEN
MRSA, PCR: NEGATIVE
Staphylococcus aureus: NEGATIVE

## 2023-04-13 SURGERY — FIXATION, FRACTURE, INTERTROCHANTERIC, WITH INTRAMEDULLARY ROD
Anesthesia: General | Laterality: Left

## 2023-04-13 MED ORDER — CHLORHEXIDINE GLUCONATE 0.12 % MT SOLN: 15.0000 mL | Freq: Once | OROMUCOSAL | Status: AC

## 2023-04-13 MED ORDER — LACTATED RINGERS IV SOLN: INTRAVENOUS | Status: DC | PRN

## 2023-04-13 MED ORDER — ACETAMINOPHEN 10 MG/ML IV SOLN
INTRAVENOUS | Status: AC
  Filled 2023-04-13: qty 100

## 2023-04-13 MED ORDER — DEXAMETHASONE SODIUM PHOSPHATE 10 MG/ML IJ SOLN
INTRAMUSCULAR | Status: AC
  Filled 2023-04-13: qty 1

## 2023-04-13 MED ORDER — HYDROCODONE-ACETAMINOPHEN 5-325 MG PO TABS
1.0000 | ORAL_TABLET | ORAL | Status: DC | PRN
  Administered 2023-04-13 – 2023-04-15 (×5): 1 via ORAL
  Filled 2023-04-13 (×5): qty 1

## 2023-04-13 MED ORDER — SENNA 8.6 MG PO TABS
1.0000 | ORAL_TABLET | Freq: Two times a day (BID) | ORAL | Status: DC
  Administered 2023-04-13 – 2023-04-16 (×6): 8.6 mg via ORAL
  Filled 2023-04-13 (×7): qty 1

## 2023-04-13 MED ORDER — MORPHINE SULFATE (PF) 2 MG/ML IV SOLN: 0.5000 mg | INTRAVENOUS | Status: DC | PRN

## 2023-04-13 MED ORDER — ACETAMINOPHEN 10 MG/ML IV SOLN
1000.0000 mg | Freq: Once | INTRAVENOUS | Status: DC | PRN
  Administered 2023-04-13: 1000 mg via INTRAVENOUS

## 2023-04-13 MED ORDER — PHENYLEPHRINE HCL-NACL 20-0.9 MG/250ML-% IV SOLN
INTRAVENOUS | Status: DC | PRN
  Administered 2023-04-13: 20 ug/min via INTRAVENOUS

## 2023-04-13 MED ORDER — FLEET ENEMA RE ENEM: 1.0000 | ENEMA | Freq: Once | RECTAL | Status: DC | PRN

## 2023-04-13 MED ORDER — PHENOL 1.4 % MT LIQD: 1.0000 | OROMUCOSAL | Status: DC | PRN

## 2023-04-13 MED ORDER — METOCLOPRAMIDE HCL 5 MG PO TABS: 5.0000 mg | ORAL_TABLET | Freq: Three times a day (TID) | ORAL | Status: DC | PRN

## 2023-04-13 MED ORDER — LACTATED RINGERS IV SOLN: INTRAVENOUS | Status: DC

## 2023-04-13 MED ORDER — OXYCODONE HCL 5 MG PO TABS: 5.0000 mg | ORAL_TABLET | Freq: Once | ORAL | Status: DC | PRN

## 2023-04-13 MED ORDER — LIDOCAINE 2% (20 MG/ML) 5 ML SYRINGE
INTRAMUSCULAR | Status: AC
  Filled 2023-04-13: qty 5

## 2023-04-13 MED ORDER — ROCURONIUM BROMIDE 10 MG/ML (PF) SYRINGE
PREFILLED_SYRINGE | INTRAVENOUS | Status: DC | PRN
  Administered 2023-04-13: 50 mg via INTRAVENOUS

## 2023-04-13 MED ORDER — ORAL CARE MOUTH RINSE: 15.0000 mL | Freq: Once | OROMUCOSAL | Status: AC

## 2023-04-13 MED ORDER — SUGAMMADEX SODIUM 200 MG/2ML IV SOLN
INTRAVENOUS | Status: DC | PRN
  Administered 2023-04-13: 200 mg via INTRAVENOUS

## 2023-04-13 MED ORDER — FENTANYL CITRATE (PF) 250 MCG/5ML IJ SOLN
INTRAMUSCULAR | Status: DC | PRN
  Administered 2023-04-13 (×3): 50 ug via INTRAVENOUS

## 2023-04-13 MED ORDER — OXYCODONE HCL 5 MG/5ML PO SOLN
5.0000 mg | Freq: Once | ORAL | Status: DC | PRN
Start: 2023-04-13 — End: 2023-04-13

## 2023-04-13 MED ORDER — FENTANYL CITRATE (PF) 100 MCG/2ML IJ SOLN
INTRAMUSCULAR | Status: AC
  Filled 2023-04-13: qty 2

## 2023-04-13 MED ORDER — METHOCARBAMOL 1000 MG/10ML IJ SOLN: 500.0000 mg | Freq: Four times a day (QID) | INTRAMUSCULAR | Status: DC | PRN

## 2023-04-13 MED ORDER — EPHEDRINE SULFATE-NACL 50-0.9 MG/10ML-% IV SOSY
PREFILLED_SYRINGE | INTRAVENOUS | Status: DC | PRN
  Administered 2023-04-13 (×2): 10 mg via INTRAVENOUS

## 2023-04-13 MED ORDER — ONDANSETRON HCL 4 MG PO TABS: 4.0000 mg | ORAL_TABLET | Freq: Four times a day (QID) | ORAL | Status: DC | PRN

## 2023-04-13 MED ORDER — INSULIN ASPART 100 UNIT/ML IJ SOLN: 0.0000 [IU] | INTRAMUSCULAR | Status: DC | PRN

## 2023-04-13 MED ORDER — FENTANYL CITRATE (PF) 250 MCG/5ML IJ SOLN
INTRAMUSCULAR | Status: AC
  Filled 2023-04-13: qty 5

## 2023-04-13 MED ORDER — FENTANYL CITRATE (PF) 100 MCG/2ML IJ SOLN
25.0000 ug | INTRAMUSCULAR | Status: DC | PRN
  Administered 2023-04-13: 25 ug via INTRAVENOUS

## 2023-04-13 MED ORDER — CHLORHEXIDINE GLUCONATE 0.12 % MT SOLN
OROMUCOSAL | Status: AC
  Administered 2023-04-13: 15 mL via OROMUCOSAL
  Filled 2023-04-13: qty 15

## 2023-04-13 MED ORDER — PROPOFOL 10 MG/ML IV BOLUS
INTRAVENOUS | Status: DC | PRN
  Administered 2023-04-13: 50 mg via INTRAVENOUS

## 2023-04-13 MED ORDER — ONDANSETRON HCL 4 MG/2ML IJ SOLN
4.0000 mg | Freq: Four times a day (QID) | INTRAMUSCULAR | Status: DC | PRN
  Administered 2023-04-13 – 2023-04-14 (×2): 4 mg via INTRAVENOUS
  Filled 2023-04-13 (×2): qty 2

## 2023-04-13 MED ORDER — ENOXAPARIN SODIUM 30 MG/0.3ML IJ SOSY
30.0000 mg | PREFILLED_SYRINGE | INTRAMUSCULAR | Status: DC
  Administered 2023-04-14 – 2023-04-15 (×2): 30 mg via SUBCUTANEOUS
  Filled 2023-04-13 (×2): qty 0.3

## 2023-04-13 MED ORDER — ONDANSETRON HCL 4 MG/2ML IJ SOLN: 4.0000 mg | Freq: Once | INTRAMUSCULAR | Status: DC | PRN

## 2023-04-13 MED ORDER — POLYETHYLENE GLYCOL 3350 17 G PO PACK
17.0000 g | PACK | Freq: Every day | ORAL | Status: DC | PRN
Start: 2023-04-13 — End: 2023-04-16

## 2023-04-13 MED ORDER — HYDROMORPHONE HCL 1 MG/ML IJ SOLN
0.5000 mg | INTRAMUSCULAR | Status: DC | PRN
  Administered 2023-04-13: 0.5 mg via INTRAVENOUS
  Administered 2023-04-13: .5 mg via INTRAVENOUS
  Filled 2023-04-13: qty 0.5

## 2023-04-13 MED ORDER — METHOCARBAMOL 500 MG PO TABS
500.0000 mg | ORAL_TABLET | Freq: Four times a day (QID) | ORAL | Status: DC | PRN
  Administered 2023-04-14 (×2): 500 mg via ORAL
  Filled 2023-04-13 (×4): qty 1

## 2023-04-13 MED ORDER — OXYCODONE HCL 5 MG PO TABS: 5.0000 mg | ORAL_TABLET | Freq: Four times a day (QID) | ORAL | Status: DC | PRN

## 2023-04-13 MED ORDER — SORBITOL 70 % SOLN: 30.0000 mL | Freq: Every day | Status: DC | PRN

## 2023-04-13 MED ORDER — ONDANSETRON HCL 4 MG/2ML IJ SOLN
INTRAMUSCULAR | Status: DC | PRN
  Administered 2023-04-13: 4 mg via INTRAVENOUS

## 2023-04-13 MED ORDER — DOCUSATE SODIUM 100 MG PO CAPS
100.0000 mg | ORAL_CAPSULE | Freq: Two times a day (BID) | ORAL | Status: DC
  Administered 2023-04-13 – 2023-04-16 (×7): 100 mg via ORAL
  Filled 2023-04-13 (×7): qty 1

## 2023-04-13 MED ORDER — DEXAMETHASONE SODIUM PHOSPHATE 10 MG/ML IJ SOLN
INTRAMUSCULAR | Status: DC | PRN
  Administered 2023-04-13: 10 mg via INTRAVENOUS

## 2023-04-13 MED ORDER — 0.9 % SODIUM CHLORIDE (POUR BTL) OPTIME
TOPICAL | Status: DC | PRN
Start: 2023-04-13 — End: 2023-04-13
  Administered 2023-04-13: 1000 mL

## 2023-04-13 MED ORDER — HYDROMORPHONE HCL 1 MG/ML IJ SOLN
INTRAMUSCULAR | Status: AC
  Filled 2023-04-13: qty 0.5

## 2023-04-13 MED ORDER — METOCLOPRAMIDE HCL 5 MG/ML IJ SOLN: 5.0000 mg | Freq: Three times a day (TID) | INTRAMUSCULAR | Status: DC | PRN

## 2023-04-13 MED ORDER — CEFAZOLIN SODIUM-DEXTROSE 2-4 GM/100ML-% IV SOLN
2.0000 g | Freq: Four times a day (QID) | INTRAVENOUS | Status: AC
  Administered 2023-04-13 (×2): 2 g via INTRAVENOUS
  Filled 2023-04-13 (×2): qty 100

## 2023-04-13 MED ORDER — ACETAMINOPHEN 325 MG PO TABS: 325.0000 mg | ORAL_TABLET | Freq: Four times a day (QID) | ORAL | Status: DC | PRN

## 2023-04-13 MED ORDER — HYDROCODONE-ACETAMINOPHEN 7.5-325 MG PO TABS
1.0000 | ORAL_TABLET | ORAL | Status: DC | PRN
  Administered 2023-04-15: 1 via ORAL
  Filled 2023-04-13 (×2): qty 1

## 2023-04-13 MED ORDER — ONDANSETRON HCL 4 MG/2ML IJ SOLN
INTRAMUSCULAR | Status: AC
  Filled 2023-04-13: qty 2

## 2023-04-13 MED ORDER — LIDOCAINE 2% (20 MG/ML) 5 ML SYRINGE
INTRAMUSCULAR | Status: DC | PRN
  Administered 2023-04-13: 80 mg via INTRAVENOUS

## 2023-04-13 MED ORDER — MENTHOL 3 MG MT LOZG: 1.0000 | LOZENGE | OROMUCOSAL | Status: DC | PRN

## 2023-04-13 SURGICAL SUPPLY — 47 items
ALCOHOL 70% 16 OZ (MISCELLANEOUS) ×1 IMPLANT
BAG COUNTER SPONGE SURGICOUNT (BAG) ×1 IMPLANT
BIT DRILL 4.3MMS DISTAL GRDTED (BIT) IMPLANT
BNDG COHESIVE 4X5 TAN STRL (GAUZE/BANDAGES/DRESSINGS) IMPLANT
CHLORAPREP W/TINT 26 (MISCELLANEOUS) ×1 IMPLANT
COVER PERINEAL POST (MISCELLANEOUS) ×1 IMPLANT
COVER SURGICAL LIGHT HANDLE (MISCELLANEOUS) ×1 IMPLANT
DERMABOND ADVANCED .7 DNX12 (GAUZE/BANDAGES/DRESSINGS) ×2 IMPLANT
DRAPE C-ARM 42X72 X-RAY (DRAPES) ×1 IMPLANT
DRAPE C-ARMOR (DRAPES) ×1 IMPLANT
DRAPE HALF SHEET 40X57 (DRAPES) ×1 IMPLANT
DRAPE IMP U-DRAPE 54X76 (DRAPES) ×2 IMPLANT
DRAPE STERI IOBAN 125X83 (DRAPES) ×1 IMPLANT
DRAPE U-SHAPE 47X51 STRL (DRAPES) ×2 IMPLANT
DRESSING AQUACEL AG SP 3.5X6 (GAUZE/BANDAGES/DRESSINGS) ×1 IMPLANT
DRILL 4.3MMS DISTAL GRADUATED (BIT) ×1
DRSG AQUACEL AG ADV 3.5X 4 (GAUZE/BANDAGES/DRESSINGS) ×1 IMPLANT
DRSG AQUACEL AG ADV 3.5X10 (GAUZE/BANDAGES/DRESSINGS) IMPLANT
DRSG AQUACEL AG SP 3.5X6 (GAUZE/BANDAGES/DRESSINGS) ×1
ELECT REM PT RETURN 9FT ADLT (ELECTROSURGICAL) ×1
ELECTRODE REM PT RTRN 9FT ADLT (ELECTROSURGICAL) ×1 IMPLANT
GLOVE BIO SURGEON STRL SZ7 (GLOVE) ×1 IMPLANT
GLOVE BIO SURGEON STRL SZ8.5 (GLOVE) ×2 IMPLANT
GLOVE BIOGEL PI IND STRL 7.5 (GLOVE) ×1 IMPLANT
GLOVE BIOGEL PI IND STRL 8.5 (GLOVE) ×1 IMPLANT
GOWN STRL REUS W/ TWL XL LVL3 (GOWN DISPOSABLE) ×1 IMPLANT
GOWN STRL REUS W/TWL 2XL LVL3 (GOWN DISPOSABLE) ×1 IMPLANT
GUIDEPIN VERSANAIL DSP 3.2X444 (ORTHOPEDIC DISPOSABLE SUPPLIES) IMPLANT
GUIDEWIRE BALL NOSE 100CM (WIRE) IMPLANT
HOOD PEEL AWAY T7 (MISCELLANEOUS) ×1 IMPLANT
KIT TURNOVER KIT B (KITS) ×1 IMPLANT
MANIFOLD NEPTUNE II (INSTRUMENTS) ×1 IMPLANT
MARKER SKIN DUAL TIP RULER LAB (MISCELLANEOUS) ×1 IMPLANT
NAIL IM AFFIXUS HIP 9X360 125D (Nail) IMPLANT
NS IRRIG 1000ML POUR BTL (IV SOLUTION) ×1 IMPLANT
PACK GENERAL/GYN (CUSTOM PROCEDURE TRAY) ×1 IMPLANT
PAD ARMBOARD 7.5X6 YLW CONV (MISCELLANEOUS) ×2 IMPLANT
PADDING CAST ABS COTTON 4X4 ST (CAST SUPPLIES) IMPLANT
SCREW BONE CORTICAL 5.0X3 (Screw) IMPLANT
SCREW LAG 6.5X80X10.5XLRG ST (Screw) IMPLANT
STAPLER SKIN PROX WIDE 3.9 (STAPLE) IMPLANT
SUT MNCRL AB 3-0 PS2 27 (SUTURE) ×1 IMPLANT
SUT MON AB 2-0 CT1 27 (SUTURE) ×1 IMPLANT
SUT MON AB 2-0 CT1 36 (SUTURE) ×1 IMPLANT
SUT VIC AB 1 CT1 27XBRD ANBCTR (SUTURE) ×1 IMPLANT
TOWEL GREEN STERILE (TOWEL DISPOSABLE) ×1 IMPLANT
TOWEL GREEN STERILE FF (TOWEL DISPOSABLE) ×1 IMPLANT

## 2023-04-13 NOTE — Progress Notes (Signed)
Patient admitted earlier this morning for fall and was found to have proximal left femur fracture.  Orthopedics has been consulted. I have reviewed patient's medical records including this morning's H&P, current vitals, labs and medications myself.  Keep NPO.  Fall precautions.  Awaiting orthopedics evaluation and surgical intervention.

## 2023-04-13 NOTE — H&P (Signed)
History and Physical  [name]:[id number] DOB: 1925/12/28 DOA: 04/12/2023  Referring physician: Accepted by dr. Arlean Hopping, [facility], hospitalist service. PCP: Caesar Bookman, NP  Outpatient Specialists: None. Patient coming from: [facility].  Chief Complaint: Fall  HPI: Mikayla [name] is a 89 y.o. female with medical history significant for type 2 diabetes, essential hypertension, hypothyroidism, HFpEF, who presented from home to [facility] ED after a mechanical fall.  Reportedly the patient tripped while attempting to ambulate.  Denies losing consciousness.  In the ED, imaging revealed proximal left femur fracture.  Orthopedic surgery was consulted by EDP who recommended transfer to [facility] for possible surgical repair in the morning.  N.p.o. after midnight.  Admitted by [facility], hospitalist service, and transferred to [facility] MedSurg unit as inpatient status.  ED Course: Temperature 97.5.  BP 108/85, pulse 66, respiratory 18, O2 saturation 94% on room air.  Lab studies notable for serum sodium 124, serum bicarb 21, glucose 162, BUN 50, creatinine 1.68, GFR 20 hemoglobin 9.9  Review of Systems: Review of systems as noted in the HPI. All other systems reviewed and are negative.   Past Medical History:  Diagnosis Date   Chronic rhinitis    CKD (chronic kidney disease), stage III (HCC)    Disorder of bone and cartilage, unspecified    Essential hypertension, benign    Glaucoma    Per [facility] new patient packet   Hearing loss    Per [facility] new patient packet   High blood pressure    Per [facility] new patient packet   High cholesterol    Per [facility] new patient packet   Obesity, unspecified    Other and unspecified hyperlipidemia    Type II or unspecified type diabetes mellitus without mention of complication, not stated as uncontrolled    Vision loss    Due to catarct Per [facility] new patient packet   Past Surgical History:  Procedure Laterality  Date   CESAREAN SECTION N/A    CORONARY/GRAFT ACUTE MI REVASCULARIZATION N/A 01/15/2022   Procedure: Coronary/Graft Acute MI Revascularization;  Surgeon: Yates Decamp, MD;  Location: [facility] INVASIVE CV LAB;  Service: Cardiovascular;  Laterality: N/A;   ESOPHAGOGASTRODUODENOSCOPY N/A 04/29/2014   Procedure: ESOPHAGOGASTRODUODENOSCOPY (EGD);  Surgeon: Rachael Fee, MD;  Location: [facility];  Service: Endoscopy;  Laterality: N/A;   LEFT HEART CATH AND CORONARY ANGIOGRAPHY N/A 01/15/2022   Procedure: LEFT HEART CATH AND CORONARY ANGIOGRAPHY;  Surgeon: Yates Decamp, MD;  Location: [facility] INVASIVE CV LAB;  Service: Cardiovascular;  Laterality: N/A;   TOTAL ABDOMINAL HYSTERECTOMY      Social History:  reports that she has never smoked. She has never used smokeless tobacco. She reports current alcohol use of about 7.0 standard drinks of alcohol per week. She reports that she does not use drugs.   Allergies  Allergen Reactions   Amoxil [Amoxicillin] Rash   Isosorb Dinitrate-Hydralazine Other (See Comments)    Severe dizziness, altered mental status   Lipitor [Atorvastatin] Other (See Comments)    Myalgias    Motrin [Ibuprofen] Other (See Comments)    Told to avoid due to kidney function   Statins Other (See Comments)    Myalgias     Family History  Problem Relation Age of Onset   COPD Mother    Lung cancer Father    Multiple myeloma Son    Prostate cancer Other    Colon cancer Neg Hx    Esophageal cancer Neg Hx  Rectal cancer Neg Hx    Stomach cancer Neg Hx       Prior to Admission medications   Medication Sig Start Date End Date Taking? Authorizing Provider  acetaminophen (TYLENOL) 500 MG tablet Take 500 mg by mouth every 4 (four) hours as needed for mild pain or headache.    [provider]  albuterol (VENTOLIN HFA) 108 (90 Base) MCG/ACT inhaler Inhale 2 puffs into the lungs every 6 (six) hours as needed for wheezing or shortness of breath. 10/21/21   Sharon Seller, NP   amLODipine (NORVASC) 10 MG tablet TAKE 1 TABLET BY MOUTH DAILY 03/12/23   Ngetich, Dinah C, NP  bisoprolol (ZEBETA) 5 MG tablet TAKE 1 TABLET BY MOUTH 2 TIMES DAILY. 12/21/22   Sharon Seller, NP  budesonide-formoterol (SYMBICORT) 80-4.5 MCG/ACT inhaler Inhale 2 puffs into the lungs 2 (two) times daily as needed (shortness of breath/wheezing). 12/16/20   Parrett, Virgel Bouquet, NP  Cholecalciferol (VITAMIN D-3 PO) Take 1 capsule by mouth daily.    [provider]  clopidogrel (PLAVIX) 75 MG tablet TAKE 1 TABLET (75 MG TOTAL) BY MOUTH DAILY. 01/11/23 01/11/24  Yates Decamp, MD  dorzolamide-timolol (COSOPT) 22.3-6.8 MG/ML ophthalmic solution Place 1 drop into both eyes 2 (two) times daily.  04/18/19   [provider]  ezetimibe (ZETIA) 10 MG tablet TAKE 1 TABLET BY MOUTH DAILY 10/18/22   Yates Decamp, MD  furosemide (LASIX) 40 MG tablet Take 1 tablet (40 mg total) by mouth daily. 07/14/22   Sharon Seller, NP  hydrALAZINE (APRESOLINE) 50 MG tablet Take 0.5 mg by mouth 3 (three) times daily.    [provider]  isosorbide dinitrate (ISORDIL) 30 MG tablet Take 0.5 tablets (15 mg total) by mouth 3 (three) times daily. 03/28/22   Yates Decamp, MD  levothyroxine (SYNTHROID) 50 MCG tablet TAKE 1 TABLET BY MOUTH DAILY BEFORE BREAKFAST. 05/10/22   Nyoka Cowden, MD  losartan (COZAAR) 25 MG tablet TAKE 1 TABLET BY MOUTH DAILY 10/10/22   Yates Decamp, MD  metFORMIN (GLUCOPHAGE) 500 MG tablet TAKE 1 TABLET (500 MG TOTAL) BY MOUTH DAILY WITH BREAKFAST. 03/12/23   Ngetich, Dinah C, NP  Multiple Vitamins-Minerals (CENTRUM SILVER PO) Take 1 tablet by mouth daily.    [provider]  nitroGLYCERIN (NITROSTAT) 0.4 MG SL tablet Place 1 tablet (0.4 mg total) under the tongue every 5 (five) minutes as needed for chest pain. 01/17/22   Yates Decamp, MD  Nutritional Supplements (ENSURE ACTIVE HIGH PROTEIN) LIQD Take 1 Bottle by mouth 3 (three) times a week. 01/05/23   Ngetich, Dinah C, NP  ofloxacin  (OCUFLOX) 0.3 % ophthalmic solution Place 1 drop into the left eye 4 (four) times daily. 10/05/22   [provider]    Physical Exam: BP 108/85 (BP Location: Left Arm)   Pulse 66   Temp (!) 97.5 F (36.4 C) (Oral)   Resp 18   Ht 4\' 11"  (1.499 m)   Wt 52.2 kg   SpO2 94%   BMI 23.23 kg/m   General: 89 y.o. year-old female well developed well nourished in no acute distress.  Alert and oriented x3. Cardiovascular: Regular rate and rhythm with no rubs or gallops.  No thyromegaly or JVD noted.  No lower extremity edema. 2/4 pulses in all 4 extremities. Respiratory: Clear to auscultation with no wheezes or rales. Good inspiratory effort. Abdomen: Soft nontender nondistended with normal bowel sounds x4 quadrants. Muskuloskeletal: No cyanosis, clubbing or edema noted bilaterally Neuro:  CN II-XII intact, strength, sensation, reflexes Skin: No ulcerative lesions noted or rashes Psychiatry: Judgement and insight appear normal. Mood is appropriate for condition and setting          Labs on Admission:  Basic Metabolic Panel: Recent Labs  Lab 04/12/23 1906  NA 134*  K 5.0  CL 100  CO2 21*  GLUCOSE 162*  BUN 50*  CREATININE 1.68*  CALCIUM 9.2   Liver Function Tests: Recent Labs  Lab 04/12/23 1906  AST 12*  ALT 7  ALKPHOS 25*  BILITOT 0.4  PROT 6.7  ALBUMIN 3.9   No results for input(s): "LIPASE", "AMYLASE" in the last 168 hours. No results for input(s): "AMMONIA" in the last 168 hours. CBC: Recent Labs  Lab 04/12/23 1906  WBC 9.7  NEUTROABS 7.0  HGB 9.9*  HCT 30.1*  MCV 92.6  PLT 210   Cardiac Enzymes: No results for input(s): "CKTOTAL", "CKMB", "CKMBINDEX", "TROPONINI" in the last 168 hours.  BNP (last 3 results) No results for input(s): "BNP" in the last 8760 hours.  ProBNP (last 3 results) Recent Labs    04/25/22 1232  PROBNP 4,724*    CBG: Recent Labs  Lab 04/12/23 2358  GLUCAP 136*    Radiological Exams on Admission: DG Chest Port 1  View  Result Date: 04/12/2023 CLINICAL DATA:  [id number]. Witnessed fall injury, left lower extremity shortening and pain, unable to move left lower extremity. EXAM: LEFT FEMUR 2 VIEWS; PORTABLE CHEST - 1 VIEW; RIGHT SHOULDER - 2+ VIEW; PELVIS - 1-2 VIEW COMPARISON:  PA and lateral chest 07/14/2022, and CT abdomen and pelvis and reformats 06/24/2008 FINDINGS: Chest AP portable 8:05 p.m.: The heart is mildly enlarged. No vascular congestion is seen. The aorta is tortuous and calcified with stable mediastinum. The lungs are clear. No displaced rib fracture or pneumothorax are seen. Osteopenia, degenerative change and mild levoscoliosis thoracic spine. No new osseous findings. There is a tangle of overlying monitor wiring. Right shoulder, AP and transscapular Y-views only: Osteopenia. There is no evidence of fracture or dislocation. There are degenerative subcortical cystic changes along the humeral cuff insertion, slight spurring at the [facility] joint and inferior glenohumeral joint. There is a somewhat high-riding appearance of the humerus which could be positional or due to chronic rotator cuff arthropathy. AP pelvis single view: Osteopenia. There is an acute oblique subtrochanteric proximal left femoral fracture, with mild medial angulation of the main distal fragment and less than 1 cortex width lateral displacement. There is a butterfly comminution fragment at the lateral proximal fracture margin which is somewhat laterally rotated. The pelvic rings are intact AP. No pelvic fracture or diastasis is seen. There are osteophytes of the SI joints, pubic symphysis. The visualized proximal right femur is unremarkable. There is mild arthrosis of both hips, pelvic enthesopathy. The iliofemoral arteries are heavily calcified. There is advanced degenerative change of the visualized lower lumbar spine. Multiple pelvic phleboliths. AP and cross-table lateral views left femur, total of 4 films: Above-described proximal femoral  fracture is redemonstrated, with mild angulation and slight displacement. No fracture is seen in the remainder of the femur. There is moderate arthrosis at the knee. No suprapatellar effusion is evident. Heavy calcification continues down the superficial femoral artery and into the popliteal and popliteal trifurcation arteries. IMPRESSION: 1. Acute oblique subtrochanteric proximal left femoral fracture with mild angulation and slight displacement. 2. No fracture is seen in the remainder of the left femur. 3. No pelvic fracture or diastasis is seen. 4. No acute  radiographic chest findings. Mild cardiomegaly. 5. Osteopenia and degenerative change right shoulder without evidence of fracture. 6. Heavy aortic and peripheral vascular atherosclerosis. Electronically Signed   By: Almira Bar M.D.   On: 04/12/2023 20:46   DG Shoulder Right  Result Date: 04/12/2023 CLINICAL DATA:  [id number]. Witnessed fall injury, left lower extremity shortening and pain, unable to move left lower extremity. EXAM: LEFT FEMUR 2 VIEWS; PORTABLE CHEST - 1 VIEW; RIGHT SHOULDER - 2+ VIEW; PELVIS - 1-2 VIEW COMPARISON:  PA and lateral chest 07/14/2022, and CT abdomen and pelvis and reformats 06/24/2008 FINDINGS: Chest AP portable 8:05 p.m.: The heart is mildly enlarged. No vascular congestion is seen. The aorta is tortuous and calcified with stable mediastinum. The lungs are clear. No displaced rib fracture or pneumothorax are seen. Osteopenia, degenerative change and mild levoscoliosis thoracic spine. No new osseous findings. There is a tangle of overlying monitor wiring. Right shoulder, AP and transscapular Y-views only: Osteopenia. There is no evidence of fracture or dislocation. There are degenerative subcortical cystic changes along the humeral cuff insertion, slight spurring at the [facility] joint and inferior glenohumeral joint. There is a somewhat high-riding appearance of the humerus which could be positional or due to chronic rotator cuff  arthropathy. AP pelvis single view: Osteopenia. There is an acute oblique subtrochanteric proximal left femoral fracture, with mild medial angulation of the main distal fragment and less than 1 cortex width lateral displacement. There is a butterfly comminution fragment at the lateral proximal fracture margin which is somewhat laterally rotated. The pelvic rings are intact AP. No pelvic fracture or diastasis is seen. There are osteophytes of the SI joints, pubic symphysis. The visualized proximal right femur is unremarkable. There is mild arthrosis of both hips, pelvic enthesopathy. The iliofemoral arteries are heavily calcified. There is advanced degenerative change of the visualized lower lumbar spine. Multiple pelvic phleboliths. AP and cross-table lateral views left femur, total of 4 films: Above-described proximal femoral fracture is redemonstrated, with mild angulation and slight displacement. No fracture is seen in the remainder of the femur. There is moderate arthrosis at the knee. No suprapatellar effusion is evident. Heavy calcification continues down the superficial femoral artery and into the popliteal and popliteal trifurcation arteries. IMPRESSION: 1. Acute oblique subtrochanteric proximal left femoral fracture with mild angulation and slight displacement. 2. No fracture is seen in the remainder of the left femur. 3. No pelvic fracture or diastasis is seen. 4. No acute radiographic chest findings. Mild cardiomegaly. 5. Osteopenia and degenerative change right shoulder without evidence of fracture. 6. Heavy aortic and peripheral vascular atherosclerosis. Electronically Signed   By: Almira Bar M.D.   On: 04/12/2023 20:46   DG Pelvis 1-2 Views  Result Date: 04/12/2023 CLINICAL DATA:  [id number]. Witnessed fall injury, left lower extremity shortening and pain, unable to move left lower extremity. EXAM: LEFT FEMUR 2 VIEWS; PORTABLE CHEST - 1 VIEW; RIGHT SHOULDER - 2+ VIEW; PELVIS - 1-2 VIEW COMPARISON:   PA and lateral chest 07/14/2022, and CT abdomen and pelvis and reformats 06/24/2008 FINDINGS: Chest AP portable 8:05 p.m.: The heart is mildly enlarged. No vascular congestion is seen. The aorta is tortuous and calcified with stable mediastinum. The lungs are clear. No displaced rib fracture or pneumothorax are seen. Osteopenia, degenerative change and mild levoscoliosis thoracic spine. No new osseous findings. There is a tangle of overlying monitor wiring. Right shoulder, AP and transscapular Y-views only: Osteopenia. There is no evidence of fracture or dislocation. There are degenerative subcortical cystic changes  along the humeral cuff insertion, slight spurring at the [facility] joint and inferior glenohumeral joint. There is a somewhat high-riding appearance of the humerus which could be positional or due to chronic rotator cuff arthropathy. AP pelvis single view: Osteopenia. There is an acute oblique subtrochanteric proximal left femoral fracture, with mild medial angulation of the main distal fragment and less than 1 cortex width lateral displacement. There is a butterfly comminution fragment at the lateral proximal fracture margin which is somewhat laterally rotated. The pelvic rings are intact AP. No pelvic fracture or diastasis is seen. There are osteophytes of the SI joints, pubic symphysis. The visualized proximal right femur is unremarkable. There is mild arthrosis of both hips, pelvic enthesopathy. The iliofemoral arteries are heavily calcified. There is advanced degenerative change of the visualized lower lumbar spine. Multiple pelvic phleboliths. AP and cross-table lateral views left femur, total of 4 films: Above-described proximal femoral fracture is redemonstrated, with mild angulation and slight displacement. No fracture is seen in the remainder of the femur. There is moderate arthrosis at the knee. No suprapatellar effusion is evident. Heavy calcification continues down the superficial femoral artery  and into the popliteal and popliteal trifurcation arteries. IMPRESSION: 1. Acute oblique subtrochanteric proximal left femoral fracture with mild angulation and slight displacement. 2. No fracture is seen in the remainder of the left femur. 3. No pelvic fracture or diastasis is seen. 4. No acute radiographic chest findings. Mild cardiomegaly. 5. Osteopenia and degenerative change right shoulder without evidence of fracture. 6. Heavy aortic and peripheral vascular atherosclerosis. Electronically Signed   By: Almira Bar M.D.   On: 04/12/2023 20:46   DG FEMUR MIN 2 VIEWS LEFT  Result Date: 04/12/2023 CLINICAL DATA:  [id number]. Witnessed fall injury, left lower extremity shortening and pain, unable to move left lower extremity. EXAM: LEFT FEMUR 2 VIEWS; PORTABLE CHEST - 1 VIEW; RIGHT SHOULDER - 2+ VIEW; PELVIS - 1-2 VIEW COMPARISON:  PA and lateral chest 07/14/2022, and CT abdomen and pelvis and reformats 06/24/2008 FINDINGS: Chest AP portable 8:05 p.m.: The heart is mildly enlarged. No vascular congestion is seen. The aorta is tortuous and calcified with stable mediastinum. The lungs are clear. No displaced rib fracture or pneumothorax are seen. Osteopenia, degenerative change and mild levoscoliosis thoracic spine. No new osseous findings. There is a tangle of overlying monitor wiring. Right shoulder, AP and transscapular Y-views only: Osteopenia. There is no evidence of fracture or dislocation. There are degenerative subcortical cystic changes along the humeral cuff insertion, slight spurring at the [facility] joint and inferior glenohumeral joint. There is a somewhat high-riding appearance of the humerus which could be positional or due to chronic rotator cuff arthropathy. AP pelvis single view: Osteopenia. There is an acute oblique subtrochanteric proximal left femoral fracture, with mild medial angulation of the main distal fragment and less than 1 cortex width lateral displacement. There is a butterfly comminution  fragment at the lateral proximal fracture margin which is somewhat laterally rotated. The pelvic rings are intact AP. No pelvic fracture or diastasis is seen. There are osteophytes of the SI joints, pubic symphysis. The visualized proximal right femur is unremarkable. There is mild arthrosis of both hips, pelvic enthesopathy. The iliofemoral arteries are heavily calcified. There is advanced degenerative change of the visualized lower lumbar spine. Multiple pelvic phleboliths. AP and cross-table lateral views left femur, total of 4 films: Above-described proximal femoral fracture is redemonstrated, with mild angulation and slight displacement. No fracture is seen in the remainder of the femur. There  is moderate arthrosis at the knee. No suprapatellar effusion is evident. Heavy calcification continues down the superficial femoral artery and into the popliteal and popliteal trifurcation arteries. IMPRESSION: 1. Acute oblique subtrochanteric proximal left femoral fracture with mild angulation and slight displacement. 2. No fracture is seen in the remainder of the left femur. 3. No pelvic fracture or diastasis is seen. 4. No acute radiographic chest findings. Mild cardiomegaly. 5. Osteopenia and degenerative change right shoulder without evidence of fracture. 6. Heavy aortic and peripheral vascular atherosclerosis. Electronically Signed   By: Almira Bar M.D.   On: 04/12/2023 20:46    EKG: I independently viewed the EKG done and my findings are as followed: Sinus bradycardia rate of 54.  Nonspecific ST-T changes.  QTc 457.  Assessment/Plan Present on Admission:  Closed left hip fracture [facility])  Principal Problem:   Closed left hip fracture (HCC)  Acute proximal left femur fracture, post mechanical fall, POA She tripped and fell while attempting to ambulate EDP discussed the case with orthopedic surgery N.p.o. after midnight for orthopedic surgical repair As needed analgesics Fall  precautions  Essential hypertension BPs are soft Hold off home oral antihypertensives Closely monitor vital signs  Type 2 diabetes with hyperglycemia Hold off home metformin Insulin sliding scale Last hemoglobin A1c 6.1.  Hypothyroidism Resume home levothyroxine  Hyperlipidemia Resume home regimen.   Time: 75 minutes.   DVT prophylaxis: SCDs.  Defer pharmacological DVT prophylaxis to orthopedic surgery.  Code Status: Full code.  Family Communication: None at bedside.  Disposition Plan: Admitted to MedSurg unit.  Consults called: Orthopedic surgery.  Admission status: Inpatient status.   Status is: Inpatient The patient requires at least 2 midnights for further evaluation and treatment of present condition.   Darlin Drop MD [facility] Pager [phone]  If 7PM-7AM, please contact night-coverage [email or web address] Password [facility]  04/13/2023, 12:26 AM

## 2023-04-13 NOTE — Progress Notes (Signed)
Pt refused her insulin dose. She stated she takes metformin at home. On call MD notified.

## 2023-04-13 NOTE — Plan of Care (Signed)
  Problem: Education: Goal: Knowledge of General Education information will improve Description: Including pain rating scale, medication(s)/side effects and non-pharmacologic comfort measures Outcome: Progressing   Problem: Health Behavior/Discharge Planning: Goal: Ability to manage health-related needs will improve Outcome: Progressing   Problem: Activity: Goal: Risk for activity intolerance will decrease Outcome: Progressing   

## 2023-04-13 NOTE — Anesthesia Preprocedure Evaluation (Addendum)
Anesthesia Evaluation  Patient identified by MRN, date of birth, ID band Patient awake    Reviewed: Allergy & Precautions, NPO status , Patient's Chart, lab work & pertinent test results, reviewed documented beta blocker date and time   History of Anesthesia Complications Negative for: history of anesthetic complications  Airway Mallampati: III  TM Distance: >3 FB     Dental no notable dental hx. (+) Poor Dentition   Pulmonary asthma , neg sleep apnea, neg COPD, neg PE   breath sounds clear to auscultation       Cardiovascular hypertension, + CAD, + Past MI, + Cardiac Stents and + DOE  (-) CABG and (-) Peripheral Vascular Disease (-) dysrhythmias  Rhythm:Regular Rate:Normal     Neuro/Psych neg Seizures    GI/Hepatic ,neg GERD  ,,(+) neg Cirrhosis        Endo/Other  diabetes, Well Controlled, Type 2, Oral Hypoglycemic AgentsHypothyroidism    Renal/GU CRFRenal disease     Musculoskeletal   Abdominal   Peds  Hematology  (+) Blood dyscrasia, anemia   Anesthesia Other Findings   Reproductive/Obstetrics                             Anesthesia Physical Anesthesia Plan  ASA: 3  Anesthesia Plan: General   Post-op Pain Management:    Induction: Intravenous  PONV Risk Score and Plan: 2 and Ondansetron and Dexamethasone  Airway Management Planned: Oral ETT  Additional Equipment:   Intra-op Plan:   Post-operative Plan: Extubation in OR  Informed Consent: I have reviewed the patients History and Physical, chart, labs and discussed the procedure including the risks, benefits and alternatives for the proposed anesthesia with the patient or authorized representative who has indicated his/her understanding and acceptance.     Dental advisory given  Plan Discussed with: CRNA  Anesthesia Plan Comments:        Anesthesia Quick Evaluation

## 2023-04-13 NOTE — Anesthesia Postprocedure Evaluation (Signed)
Anesthesia Post Note  Patient: Mikayla [name]  Procedure(s) Performed: OPEN TREATMENT OF LEFT PROXIMAL FEMUR FRACTURE WITH INTERMEDULLARY NAILING (Left)     Patient location during evaluation: PACU Anesthesia Type: General Level of consciousness: awake and alert Pain management: pain level controlled Vital Signs Assessment: post-procedure vital signs reviewed and stable Respiratory status: spontaneous breathing, nonlabored ventilation and respiratory function stable Cardiovascular status: blood pressure returned to baseline and stable Postop Assessment: no apparent nausea or vomiting Anesthetic complications: no   There were no known notable events for this encounter.                  Candiss Galeana A.

## 2023-04-13 NOTE — Anesthesia Procedure Notes (Signed)
Procedure Name: Intubation Date/Time: 04/13/2023 9:28 AM  Performed by: Allyn Kenner, CRNAPre-anesthesia Checklist: Patient identified, Emergency Drugs available, Suction available and Patient being monitored Patient Re-evaluated:Patient Re-evaluated prior to induction Oxygen Delivery Method: Circle System Utilized Preoxygenation: Pre-oxygenation with 100% oxygen Induction Type: IV induction Ventilation: Mask ventilation without difficulty Laryngoscope Size: Mac, Glidescope and 3 Grade View: Grade I Tube type: Oral Tube size: 7.0 mm Number of attempts: 1 Airway Equipment and Method: Stylet and Oral airway Placement Confirmation: ETT inserted through vocal cords under direct vision, positive ETCO2 and breath sounds checked- equal and bilateral Secured at: 21 cm Tube secured with: Tape Dental Injury: Teeth and Oropharynx as per pre-operative assessment

## 2023-04-13 NOTE — Transfer of Care (Signed)
Immediate Anesthesia Transfer of Care Note  Patient: Mikayla [name]  Procedure(s) Performed: OPEN TREATMENT OF LEFT PROXIMAL FEMUR FRACTURE WITH INTERMEDULLARY NAILING (Left)  Patient Location: PACU  Anesthesia Type:General  Level of Consciousness: awake, alert , and oriented  Airway & Oxygen Therapy: Patient Spontanous Breathing and Patient connected to face mask oxygen  Post-op Assessment: Report given to RN and Post -op Vital signs reviewed and stable  Post vital signs: Reviewed and stable  Last Vitals:  Vitals Value Taken Time  BP 128/52 04/13/23 1118  Temp 36.3 C 04/13/23 1118  Pulse 53 04/13/23 1125  Resp 10 04/13/23 1125  SpO2 92 % 04/13/23 1125  Vitals shown include unfiled device data.  Last Pain:  Vitals:   04/13/23 0855  TempSrc: Oral  PainSc:          Complications: No notable events documented.

## 2023-04-13 NOTE — Consult Note (Signed)
Palliative Care Consult Note                                  Date: 04/13/2023   Patient Name: Mikayla [name]  DOB: 08-Oct-1925  MRN: [id number]  Age / Sex: 89 y.o., female  PCP: Ngetich, Donalee Citrin, NP Referring Physician: Glade Lloyd, MD  Reason for Consultation: Establishing goals of care  HPI/Patient Profile: 89 y.o. female  with past medical history of hypertension, hypercholesterolemia, type 2 diabetes on metformin, stage IIIa-b chronic kidney disease, chronic hyponatremia, inferior STEMI on 01/15/2022 SB angioplasty to RCA, and chronic diastolic heart failure admitted on 04/12/2023 after a fall at home.   She was brought to the emergency department at [facility], where x-rays revealed a left comminuted subtrochanteric femur fracture. She was admitted by the hospitalist to [facility]. Orthopedic consultation was placed for management of her left subtrochanteric femur fracture.   Past Medical History:  Diagnosis Date   Chronic rhinitis    CKD (chronic kidney disease), stage III (HCC)    Disorder of bone and cartilage, unspecified    Essential hypertension, benign    Glaucoma    Per [facility] new patient packet   Hearing loss    Per [facility] new patient packet   High blood pressure    Per [facility] new patient packet   High cholesterol    Per [facility] new patient packet   Obesity, unspecified    Other and unspecified hyperlipidemia    Type II or unspecified type diabetes mellitus without mention of complication, not stated as uncontrolled    Vision loss    Due to catarct Per [facility] new patient packet    Subjective:   I have reviewed medical records including EPIC notes, labs and imaging, assessed the patient and then met with the patient and her daughter to discuss diagnosis prognosis, GOC, EOL wishes, disposition and options. The patient was tired after her procedure and requested I speak with her daughter.  I introduced  Palliative Medicine as specialized medical care for people living with serious illness. It focuses on providing relief from symptoms and stress of a serious illness. The goal is to improve quality of life for both the patient and the family.  Today's Discussion: Patient had repair of her left subtrochanteric femur fracture earlier today and was too tired to talk. She asked me to speak with her daughter. We discussed her overall health including her comorbidities and her independence. Patient was living independently in her home prior to fall at her daughter's house and this hospitalization. Patient's daughter is concerned with how this fall may impact her mother's independence which is very important to her. We discussed the importance of taking things one day at a time to see how the patient progresses with PT/OT.  A discussion was had today regarding advanced directives. Concepts specific to code status and scope of care was had. The MOST form was introduced and discussed. Recommended consideration of DNR status, understanding evidenced-based poor outcomes in similar hospitalized patients, as the cause of the arrest is likely associated with chronic/terminal disease rather than a reversible acute cardio-pulmonary event. We discussed the importance of continued conversation with family and the medical providers regarding overall plan of care and treatment options, ensuring decisions are within the context of the patient's values and GOCs. Patient's daughter plans to discuss with her brother and the patient. We plan to meet  tomorrow to see if they would like to complete a MOST form while hospitalized.  Questions and concerns were addressed. Hard Choices booklet left for review. The family was encouraged to call with questions or concerns. PMT will continue to support holistically.  Review of Systems  Objective:   Primary Diagnoses: Present on Admission:  Closed left hip fracture [facility])   Physical  Exam Vitals reviewed.  Constitutional:      General: She is sleeping.  HENT:     Head: Normocephalic and atraumatic.  Cardiovascular:     Rate and Rhythm: Normal rate.  Pulmonary:     Effort: Pulmonary effort is normal.  Skin:    General: Skin is warm and dry.  Neurological:     Mental Status: She is easily aroused.  Psychiatric:        Mood and Affect: Mood normal.        Behavior: Behavior normal.     Vital Signs:  BP (!) 141/43 (BP Location: Left Arm)   Pulse (!) 56   Temp 98.2 F (36.8 C)   Resp 17   Ht 4\' 11"  (1.499 m)   Wt 52.2 kg   SpO2 96%   BMI 23.23 kg/m    Advanced Care Planning:   Existing Vynca/ACP Documentation: No documents in Vynca. Patient does have HCPOA and declaration of a desired natural death in her paper chart.  Primary Decision Maker: Patient. If the patient were unable to make decisions her HCPOA document lists her son Anacelia Meikle as her first decision maker and daughter Journii Vereen.  Code Status/Advance Care Planning: Full code   Assessment & Plan:   SUMMARY OF RECOMMENDATIONS   Full code Full scope Continued discussion between family re: goals of care PMT to follow up tomorrow   Time Total: 75 minutes  Thank you for allowing [place] to participate in the care of [name] PMT will continue to support holistically.    Signed by: Sarina Ser, NP Palliative Medicine Team  Team Phone # [phone] (Nights/Weekends)  04/13/2023, 2:30 PM

## 2023-04-13 NOTE — Consult Note (Signed)
ORTHOPAEDIC CONSULTATION  REQUESTING PHYSICIAN: Glade Lloyd, MD  PCP:  Caesar Bookman, NP  Chief Complaint: left femur fracture  HPI: Mikayla [name] is a 89 y.o. female with PMHx significant for hypertension, hypercholesterolemia, type 2 diabetes on metformin, stage IIIa-b chronic kidney disease, chronic hyponatremia, inferior STEMI on 01/15/2022 SB angioplasty to RCA, and chronic diastolic heart failure who sustained a ground-level fall at home yesterday.  She had immediate left thigh pain and inability to weight-bear.  She was brought to the emergency department at [facility], where x-rays revealed a left comminuted subtrochanteric femur fracture.  She was admitted by the hospitalist to [facility].  Orthopedic consultation was placed for management of her left subtrochanteric femur fracture.  Past Medical History:  Diagnosis Date   Chronic rhinitis    CKD (chronic kidney disease), stage III (HCC)    Disorder of bone and cartilage, unspecified    Essential hypertension, benign    Glaucoma    Per [facility] new patient packet   Hearing loss    Per [facility] new patient packet   High blood pressure    Per [facility] new patient packet   High cholesterol    Per [facility] new patient packet   Obesity, unspecified    Other and unspecified hyperlipidemia    Type II or unspecified type diabetes mellitus without mention of complication, not stated as uncontrolled    Vision loss    Due to catarct Per [facility] new patient packet   Past Surgical History:  Procedure Laterality Date   CESAREAN SECTION N/A    CORONARY/GRAFT ACUTE MI REVASCULARIZATION N/A 01/15/2022   Procedure: Coronary/Graft Acute MI Revascularization;  Surgeon: Yates Decamp, MD;  Location: [facility] INVASIVE CV LAB;  Service: Cardiovascular;  Laterality: N/A;   ESOPHAGOGASTRODUODENOSCOPY N/A 04/29/2014   Procedure: ESOPHAGOGASTRODUODENOSCOPY (EGD);  Surgeon: Rachael Fee, MD;  Location: [facility];  Service: Endoscopy;   Laterality: N/A;   LEFT HEART CATH AND CORONARY ANGIOGRAPHY N/A 01/15/2022   Procedure: LEFT HEART CATH AND CORONARY ANGIOGRAPHY;  Surgeon: Yates Decamp, MD;  Location: [facility] INVASIVE CV LAB;  Service: Cardiovascular;  Laterality: N/A;   TOTAL ABDOMINAL HYSTERECTOMY     Social History   Socioeconomic History   Marital status: Married    Spouse name: Not on file   Number of children: 3   Years of education: Not on file   Highest education level: Not on file  Occupational History   Not on file  Tobacco Use   Smoking status: Never   Smokeless tobacco: Never  Vaping Use   Vaping status: Never Used  Substance and Sexual Activity   Alcohol use: Yes    Alcohol/week: 7.0 standard drinks of alcohol    Types: 7 Standard drinks or equivalent per week    Comment: wine with dinner   Drug use: No   Sexual activity: Not on file  Other Topics Concern   Not on file  Social History Narrative   Diet      Do you drink/eat things with caffeine  YES      Marital Status Widowed  What year were you married?  1953      Do you live in a house, apartment, assisted living, condo, trailer, etc.?  House      Is it one or more stories?  One      How many persons live in your home?  1         Do you have any  pets in your home?(please list)  No      Highest level of education completed:  High School      Current or past profession:  Insurance account manager      Do you exercise?:  NO    Type and how often:      Do you have a Living Will? (Form that indicates scenarios where you would not want your life prolonged)  YES      Do you have a DNR form?   YES      If not, would you like to discuss one?      Do you have signed POA/HPOA forms?  YES      Do you have difficulty bathing or dressing yourself?  NO      Do you have difficulty preparing food or eating?  NO      Do you have difficulty managing medications?  NO      Do you have difficulty managing your finances?  NO      Do you have difficulty  affording your medications? NO                     Social Determinants of Health   Financial Resource Strain: Not on file  Food Insecurity: No Food Insecurity (04/12/2023)   Hunger Vital Sign    Worried About Running Out of Food in the Last Year: Never true    Ran Out of Food in the Last Year: Never true  Transportation Needs: No Transportation Needs (04/12/2023)   PRAPARE - Administrator, Civil Service (Medical): No    Lack of Transportation (Non-Medical): No  Physical Activity: Not on file  Stress: Not on file  Social Connections: Not on file   Family History  Problem Relation Age of Onset   COPD Mother    Lung cancer Father    Multiple myeloma Son    Prostate cancer Other    Colon cancer Neg Hx    Esophageal cancer Neg Hx    Rectal cancer Neg Hx    Stomach cancer Neg Hx    Allergies  Allergen Reactions   Amoxil [Amoxicillin] Rash   Isosorb Dinitrate-Hydralazine Other (See Comments)    Severe dizziness, altered mental status   Lipitor [Atorvastatin] Other (See Comments)    Myalgias    Motrin [Ibuprofen] Other (See Comments)    Told to avoid due to kidney function   Statins Other (See Comments)    Myalgias    Prior to Admission medications   Medication Sig Start Date End Date Taking? Authorizing Provider  acetaminophen (TYLENOL) 500 MG tablet Take 500 mg by mouth every 4 (four) hours as needed for mild pain or headache.    [provider]  albuterol (VENTOLIN HFA) 108 (90 Base) MCG/ACT inhaler Inhale 2 puffs into the lungs every 6 (six) hours as needed for wheezing or shortness of breath. 10/21/21   Sharon Seller, NP  amLODipine (NORVASC) 10 MG tablet TAKE 1 TABLET BY MOUTH DAILY 03/12/23   Ngetich, Dinah C, NP  bisoprolol (ZEBETA) 5 MG tablet TAKE 1 TABLET BY MOUTH 2 TIMES DAILY. 12/21/22   Sharon Seller, NP  budesonide-formoterol (SYMBICORT) 80-4.5 MCG/ACT inhaler Inhale 2 puffs into the lungs 2 (two) times daily as needed (shortness  of breath/wheezing). 12/16/20   Parrett, Virgel Bouquet, NP  Cholecalciferol (VITAMIN D-3 PO) Take 1 capsule by mouth daily.    [provider]  clopidogrel (PLAVIX) 75 MG tablet  TAKE 1 TABLET (75 MG TOTAL) BY MOUTH DAILY. 01/11/23 01/11/24  Yates Decamp, MD  dorzolamide-timolol (COSOPT) 22.3-6.8 MG/ML ophthalmic solution Place 1 drop into both eyes 2 (two) times daily.  04/18/19   [provider]  ezetimibe (ZETIA) 10 MG tablet TAKE 1 TABLET BY MOUTH DAILY 10/18/22   Yates Decamp, MD  furosemide (LASIX) 40 MG tablet Take 1 tablet (40 mg total) by mouth daily. 07/14/22   Sharon Seller, NP  hydrALAZINE (APRESOLINE) 50 MG tablet Take 0.5 mg by mouth 3 (three) times daily.    [provider]  isosorbide dinitrate (ISORDIL) 30 MG tablet Take 0.5 tablets (15 mg total) by mouth 3 (three) times daily. 03/28/22   Yates Decamp, MD  levothyroxine (SYNTHROID) 50 MCG tablet TAKE 1 TABLET BY MOUTH DAILY BEFORE BREAKFAST. 05/10/22   Nyoka Cowden, MD  losartan (COZAAR) 25 MG tablet TAKE 1 TABLET BY MOUTH DAILY 10/10/22   Yates Decamp, MD  metFORMIN (GLUCOPHAGE) 500 MG tablet TAKE 1 TABLET (500 MG TOTAL) BY MOUTH DAILY WITH BREAKFAST. 03/12/23   Ngetich, Dinah C, NP  Multiple Vitamins-Minerals (CENTRUM SILVER PO) Take 1 tablet by mouth daily.    [provider]  nitroGLYCERIN (NITROSTAT) 0.4 MG SL tablet Place 1 tablet (0.4 mg total) under the tongue every 5 (five) minutes as needed for chest pain. 01/17/22   Yates Decamp, MD  Nutritional Supplements (ENSURE ACTIVE HIGH PROTEIN) LIQD Take 1 Bottle by mouth 3 (three) times a week. 01/05/23   Ngetich, Dinah C, NP  ofloxacin (OCUFLOX) 0.3 % ophthalmic solution Place 1 drop into the left eye 4 (four) times daily. 10/05/22   [provider]   DG Chest Port 1 View  Result Date: 04/12/2023 CLINICAL DATA:  [id number]. Witnessed fall injury, left lower extremity shortening and pain, unable to move left lower extremity. EXAM: LEFT FEMUR 2 VIEWS; PORTABLE  CHEST - 1 VIEW; RIGHT SHOULDER - 2+ VIEW; PELVIS - 1-2 VIEW COMPARISON:  PA and lateral chest 07/14/2022, and CT abdomen and pelvis and reformats 06/24/2008 FINDINGS: Chest AP portable 8:05 p.m.: The heart is mildly enlarged. No vascular congestion is seen. The aorta is tortuous and calcified with stable mediastinum. The lungs are clear. No displaced rib fracture or pneumothorax are seen. Osteopenia, degenerative change and mild levoscoliosis thoracic spine. No new osseous findings. There is a tangle of overlying monitor wiring. Right shoulder, AP and transscapular Y-views only: Osteopenia. There is no evidence of fracture or dislocation. There are degenerative subcortical cystic changes along the humeral cuff insertion, slight spurring at the [facility] joint and inferior glenohumeral joint. There is a somewhat high-riding appearance of the humerus which could be positional or due to chronic rotator cuff arthropathy. AP pelvis single view: Osteopenia. There is an acute oblique subtrochanteric proximal left femoral fracture, with mild medial angulation of the main distal fragment and less than 1 cortex width lateral displacement. There is a butterfly comminution fragment at the lateral proximal fracture margin which is somewhat laterally rotated. The pelvic rings are intact AP. No pelvic fracture or diastasis is seen. There are osteophytes of the SI joints, pubic symphysis. The visualized proximal right femur is unremarkable. There is mild arthrosis of both hips, pelvic enthesopathy. The iliofemoral arteries are heavily calcified. There is advanced degenerative change of the visualized lower lumbar spine. Multiple pelvic phleboliths. AP and cross-table lateral views left femur, total of 4 films: Above-described proximal femoral fracture is redemonstrated, with mild angulation and slight displacement. No fracture is seen in  the remainder of the femur. There is moderate arthrosis at the knee. No suprapatellar effusion is  evident. Heavy calcification continues down the superficial femoral artery and into the popliteal and popliteal trifurcation arteries. IMPRESSION: 1. Acute oblique subtrochanteric proximal left femoral fracture with mild angulation and slight displacement. 2. No fracture is seen in the remainder of the left femur. 3. No pelvic fracture or diastasis is seen. 4. No acute radiographic chest findings. Mild cardiomegaly. 5. Osteopenia and degenerative change right shoulder without evidence of fracture. 6. Heavy aortic and peripheral vascular atherosclerosis. Electronically Signed   By: Almira Bar M.D.   On: 04/12/2023 20:46   DG Shoulder Right  Result Date: 04/12/2023 CLINICAL DATA:  [id number]. Witnessed fall injury, left lower extremity shortening and pain, unable to move left lower extremity. EXAM: LEFT FEMUR 2 VIEWS; PORTABLE CHEST - 1 VIEW; RIGHT SHOULDER - 2+ VIEW; PELVIS - 1-2 VIEW COMPARISON:  PA and lateral chest 07/14/2022, and CT abdomen and pelvis and reformats 06/24/2008 FINDINGS: Chest AP portable 8:05 p.m.: The heart is mildly enlarged. No vascular congestion is seen. The aorta is tortuous and calcified with stable mediastinum. The lungs are clear. No displaced rib fracture or pneumothorax are seen. Osteopenia, degenerative change and mild levoscoliosis thoracic spine. No new osseous findings. There is a tangle of overlying monitor wiring. Right shoulder, AP and transscapular Y-views only: Osteopenia. There is no evidence of fracture or dislocation. There are degenerative subcortical cystic changes along the humeral cuff insertion, slight spurring at the [facility] joint and inferior glenohumeral joint. There is a somewhat high-riding appearance of the humerus which could be positional or due to chronic rotator cuff arthropathy. AP pelvis single view: Osteopenia. There is an acute oblique subtrochanteric proximal left femoral fracture, with mild medial angulation of the main distal fragment and less than 1  cortex width lateral displacement. There is a butterfly comminution fragment at the lateral proximal fracture margin which is somewhat laterally rotated. The pelvic rings are intact AP. No pelvic fracture or diastasis is seen. There are osteophytes of the SI joints, pubic symphysis. The visualized proximal right femur is unremarkable. There is mild arthrosis of both hips, pelvic enthesopathy. The iliofemoral arteries are heavily calcified. There is advanced degenerative change of the visualized lower lumbar spine. Multiple pelvic phleboliths. AP and cross-table lateral views left femur, total of 4 films: Above-described proximal femoral fracture is redemonstrated, with mild angulation and slight displacement. No fracture is seen in the remainder of the femur. There is moderate arthrosis at the knee. No suprapatellar effusion is evident. Heavy calcification continues down the superficial femoral artery and into the popliteal and popliteal trifurcation arteries. IMPRESSION: 1. Acute oblique subtrochanteric proximal left femoral fracture with mild angulation and slight displacement. 2. No fracture is seen in the remainder of the left femur. 3. No pelvic fracture or diastasis is seen. 4. No acute radiographic chest findings. Mild cardiomegaly. 5. Osteopenia and degenerative change right shoulder without evidence of fracture. 6. Heavy aortic and peripheral vascular atherosclerosis. Electronically Signed   By: Almira Bar M.D.   On: 04/12/2023 20:46   DG Pelvis 1-2 Views  Result Date: 04/12/2023 CLINICAL DATA:  [id number]. Witnessed fall injury, left lower extremity shortening and pain, unable to move left lower extremity. EXAM: LEFT FEMUR 2 VIEWS; PORTABLE CHEST - 1 VIEW; RIGHT SHOULDER - 2+ VIEW; PELVIS - 1-2 VIEW COMPARISON:  PA and lateral chest 07/14/2022, and CT abdomen and pelvis and reformats 06/24/2008 FINDINGS: Chest AP portable 8:05 p.m.: The heart  is mildly enlarged. No vascular congestion is seen. The  aorta is tortuous and calcified with stable mediastinum. The lungs are clear. No displaced rib fracture or pneumothorax are seen. Osteopenia, degenerative change and mild levoscoliosis thoracic spine. No new osseous findings. There is a tangle of overlying monitor wiring. Right shoulder, AP and transscapular Y-views only: Osteopenia. There is no evidence of fracture or dislocation. There are degenerative subcortical cystic changes along the humeral cuff insertion, slight spurring at the [facility] joint and inferior glenohumeral joint. There is a somewhat high-riding appearance of the humerus which could be positional or due to chronic rotator cuff arthropathy. AP pelvis single view: Osteopenia. There is an acute oblique subtrochanteric proximal left femoral fracture, with mild medial angulation of the main distal fragment and less than 1 cortex width lateral displacement. There is a butterfly comminution fragment at the lateral proximal fracture margin which is somewhat laterally rotated. The pelvic rings are intact AP. No pelvic fracture or diastasis is seen. There are osteophytes of the SI joints, pubic symphysis. The visualized proximal right femur is unremarkable. There is mild arthrosis of both hips, pelvic enthesopathy. The iliofemoral arteries are heavily calcified. There is advanced degenerative change of the visualized lower lumbar spine. Multiple pelvic phleboliths. AP and cross-table lateral views left femur, total of 4 films: Above-described proximal femoral fracture is redemonstrated, with mild angulation and slight displacement. No fracture is seen in the remainder of the femur. There is moderate arthrosis at the knee. No suprapatellar effusion is evident. Heavy calcification continues down the superficial femoral artery and into the popliteal and popliteal trifurcation arteries. IMPRESSION: 1. Acute oblique subtrochanteric proximal left femoral fracture with mild angulation and slight displacement. 2. No  fracture is seen in the remainder of the left femur. 3. No pelvic fracture or diastasis is seen. 4. No acute radiographic chest findings. Mild cardiomegaly. 5. Osteopenia and degenerative change right shoulder without evidence of fracture. 6. Heavy aortic and peripheral vascular atherosclerosis. Electronically Signed   By: Almira Bar M.D.   On: 04/12/2023 20:46   DG FEMUR MIN 2 VIEWS LEFT  Result Date: 04/12/2023 CLINICAL DATA:  [id number]. Witnessed fall injury, left lower extremity shortening and pain, unable to move left lower extremity. EXAM: LEFT FEMUR 2 VIEWS; PORTABLE CHEST - 1 VIEW; RIGHT SHOULDER - 2+ VIEW; PELVIS - 1-2 VIEW COMPARISON:  PA and lateral chest 07/14/2022, and CT abdomen and pelvis and reformats 06/24/2008 FINDINGS: Chest AP portable 8:05 p.m.: The heart is mildly enlarged. No vascular congestion is seen. The aorta is tortuous and calcified with stable mediastinum. The lungs are clear. No displaced rib fracture or pneumothorax are seen. Osteopenia, degenerative change and mild levoscoliosis thoracic spine. No new osseous findings. There is a tangle of overlying monitor wiring. Right shoulder, AP and transscapular Y-views only: Osteopenia. There is no evidence of fracture or dislocation. There are degenerative subcortical cystic changes along the humeral cuff insertion, slight spurring at the [facility] joint and inferior glenohumeral joint. There is a somewhat high-riding appearance of the humerus which could be positional or due to chronic rotator cuff arthropathy. AP pelvis single view: Osteopenia. There is an acute oblique subtrochanteric proximal left femoral fracture, with mild medial angulation of the main distal fragment and less than 1 cortex width lateral displacement. There is a butterfly comminution fragment at the lateral proximal fracture margin which is somewhat laterally rotated. The pelvic rings are intact AP. No pelvic fracture or diastasis is seen. There are osteophytes of the  SI joints,  pubic symphysis. The visualized proximal right femur is unremarkable. There is mild arthrosis of both hips, pelvic enthesopathy. The iliofemoral arteries are heavily calcified. There is advanced degenerative change of the visualized lower lumbar spine. Multiple pelvic phleboliths. AP and cross-table lateral views left femur, total of 4 films: Above-described proximal femoral fracture is redemonstrated, with mild angulation and slight displacement. No fracture is seen in the remainder of the femur. There is moderate arthrosis at the knee. No suprapatellar effusion is evident. Heavy calcification continues down the superficial femoral artery and into the popliteal and popliteal trifurcation arteries. IMPRESSION: 1. Acute oblique subtrochanteric proximal left femoral fracture with mild angulation and slight displacement. 2. No fracture is seen in the remainder of the left femur. 3. No pelvic fracture or diastasis is seen. 4. No acute radiographic chest findings. Mild cardiomegaly. 5. Osteopenia and degenerative change right shoulder without evidence of fracture. 6. Heavy aortic and peripheral vascular atherosclerosis. Electronically Signed   By: Almira Bar M.D.   On: 04/12/2023 20:46    Positive ROS: All other systems have been reviewed and were otherwise negative with the exception of those mentioned in the HPI and as above.  Physical Exam: General: Alert, no acute distress Cardiovascular: No pedal edema Respiratory: No cyanosis, no use of accessory musculature GI: No organomegaly, abdomen is soft and non-tender Skin: No lesions in the area of chief complaint Neurologic: Sensation intact distally Psychiatric: Patient is competent for consent with normal mood and affect Lymphatic: No axillary or cervical lymphadenopathy  MUSCULOSKELETAL: Examination of the left hip reveals no skin wounds or lesions.  She is shortened and externally rotated.  She has positive motor function dorsiflexion,  plantarflexion, and great toe extension, with strength limited by thigh pain.  She reports intact sensation.  Her feet are warm and well-perfused.  Assessment: Comminuted left subtrochanteric femur fracture.  Plan: I discussed the findings with the patient and her family.  She has an unstable displaced left subtrochanteric femur fracture that require surgical stabilization.  We discussed the risk, benefits, and alternatives to intramedullary fixation of her left femur.  We discussed restricted weightbearing postoperatively.  Plan for surgery today.  Hold chemical DVT prophylaxis.  Continue NPO.  The risks, benefits, and alternatives were discussed with the patient. There are risks associated with the surgery including, but not limited to, problems with anesthesia (death), infection, differences in leg length/angulation/rotation, fracture of bones, loosening or failure of implants, malunion, nonunion, hematoma (blood accumulation) which may require surgical drainage, blood clots, pulmonary embolism, nerve injury (foot drop), and blood vessel injury. The patient understands these risks and elects to proceed.   Jonette Pesa, MD [phone]    04/13/2023 8:46 AM

## 2023-04-13 NOTE — Progress Notes (Signed)
[facility] Pre-op Review  Pre-op checklist: To be completed by bedside RN  NPO: Ordered  Labs: T&S ordered. Hgb 8.7  Consent: Ordered  H&P: Hospitalist  Vitals: elevated BP  O2 requirements: RA  MAR/PTA review: awaiting med rec. No BB, No GLPs or anticoags ordered  IV: 22G  Floor nurse name:    Additional info:

## 2023-04-13 NOTE — Op Note (Signed)
OPERATIVE REPORT  SURGEON: Samson Frederic, MD   ASSISTANT: Staff.  PREOPERATIVE DIAGNOSIS: Comminuted Left subtrochanteric femur fracture.   POSTOPERATIVE DIAGNOSIS: Comminuted Left subtrochanteric femur fracture.   PROCEDURE: Intramedullary fixation, Left femur.   IMPLANTS: Biomet Affixus Hip Fracture Nail, 9 by 360 mm, 125 degrees. 10.5 x 80 mm Hip Fracture Nail Lag Screw. 5 x 34 mm distal interlocking screw 1.  ANESTHESIA:  General  ESTIMATED BLOOD LOSS:-100 mL    ANTIBIOTICS: 2 g Ancef.  DRAINS: None.  COMPLICATIONS: None.   CONDITION: PACU - hemodynamically stable.  BRIEF CLINICAL NOTE: Mikayla [name] is a 89 y.o. female who presented with an intertrochanteric femur fracture. The patient was admitted to the hospitalist service and underwent perioperative risk stratification and medical optimization. The risks, benefits, and alternatives to the procedure were explained, and the patient elected to proceed.  PROCEDURE IN DETAIL: Surgical site was marked by myself. The patient was taken to the operating room and anesthesia was induced on the bed. The patient was then transferred to the [facility] table and the nonoperative lower extremity was scissored underneath the operative side. The fracture was reduced with traction, internal rotation, and adduction. The hip was prepped and draped in the normal sterile surgical fashion. Timeout was called verifying side and site of surgery. Preop antibiotics were given with 60 minutes of beginning the procedure.  Fluoroscopy was used to define the patient's anatomy.  Fracture remained in varus alignment with closed reduction.  Therefore, I made a 3 cm longitudinal incision over the lateral femur centered over the subtrochanteric fracture site.  Blunt digital dissection was performed.  IT band was split in line with fibers.  I incised the posterior border of the vastus fascia, and retracted the vastus anteriorly.  I placed a Verbrugge clamp under  fluoroscopic control, and was able to obtain an anatomic reduction of the subtrochanteric fracture site.  A 4 cm incision was made just proximal to the tip of the greater trochanter. The awl was used to obtain the standard starting point for a trochanteric entry nail under fluoroscopic control. The guidepin was placed. The entry reamer was used to open the proximal femur.  I placed the guidewire to the level of the physeal scar of the knee. I measured the length of the guidewire. Sequential reaming was performed up to a size 10.5 mm with excellent chatter. Therefore, a size 9 by 360 mm nail was selected and assembled to the jig on the back table. The nail was placed without any difficulty. Through a separate stab incision, the cannula was placed down to the bone in preparation for the cephalomedullary device. A guidepin was placed into the femoral head using AP and lateral fluoroscopy views. The pin was measured, and then reaming was performed to the appropriate depth. The lag screw was inserted to the appropriate depth. The setscrew was tightened statically. Using perfect circle technique, a distal interlocking screw was placed. The jig was removed. Final AP and lateral fluoroscopy views were obtained to confirm fracture reduction and hardware placement. Tip apex distance was appropriate. There was no chondral penetration.  The wounds were copiously irrigated with saline. The wound was closed in layers with #1 Vicryl for the fascia, 2-0 Monocryl for the deep dermal layer, and and staples + Dermabond for the skin. Once the glue was fully hardened, sterile dressing was applied. The patient was then awakened from anesthesia and taken to the PACU in stable condition. Sponge needle and instrument counts were correct at the  end of the case 2. There were no known complications.  We will readmit the patient to the hospitalist. Weightbearing status will be touchdown weightbearing with a walker. We will begin Lovenox  for DVT prophylaxis. The patient will mobilize out of bed with physical therapy and undergo disposition planning.  Please note that a surgical assistant was a medical necessity for this procedure to perform it in a safe and expeditious manner. Assistant was necessary to provide appropriate retraction of vital neurovascular structures, to prevent femoral fracture, and to allow for anatomic placement of the prosthesis.

## 2023-04-14 DIAGNOSIS — S72002A Fracture of unspecified part of neck of left femur, initial encounter for closed fracture: Secondary | ICD-10-CM | POA: Diagnosis not present

## 2023-04-14 DIAGNOSIS — Z515 Encounter for palliative care: Secondary | ICD-10-CM

## 2023-04-14 LAB — CBC WITH DIFFERENTIAL/PLATELET
Abs Immature Granulocytes: 0.02 10*3/uL (ref 0.00–0.07)
Basophils Absolute: 0 10*3/uL (ref 0.0–0.1)
Basophils Relative: 0 %
Eosinophils Absolute: 0 10*3/uL (ref 0.0–0.5)
Eosinophils Relative: 0 %
HCT: 21.9 % — ABNORMAL LOW (ref 36.0–46.0)
Hemoglobin: 7 g/dL — ABNORMAL LOW (ref 12.0–15.0)
Immature Granulocytes: 0 %
Lymphocytes Relative: 12 %
Lymphs Abs: 1 10*3/uL (ref 0.7–4.0)
MCH: 29.8 pg (ref 26.0–34.0)
MCHC: 32 g/dL (ref 30.0–36.0)
MCV: 93.2 fL (ref 80.0–100.0)
Monocytes Absolute: 1 10*3/uL (ref 0.1–1.0)
Monocytes Relative: 12 %
Neutro Abs: 6 10*3/uL (ref 1.7–7.7)
Neutrophils Relative %: 76 %
Platelets: 150 10*3/uL (ref 150–400)
RBC: 2.35 MIL/uL — ABNORMAL LOW (ref 3.87–5.11)
RDW: 12.6 % (ref 11.5–15.5)
WBC: 8 10*3/uL (ref 4.0–10.5)
nRBC: 0 % (ref 0.0–0.2)

## 2023-04-14 LAB — BASIC METABOLIC PANEL
Anion gap: 5 (ref 5–15)
BUN: 45 mg/dL — ABNORMAL HIGH (ref 8–23)
CO2: 22 mmol/L (ref 22–32)
Calcium: 8.5 mg/dL — ABNORMAL LOW (ref 8.9–10.3)
Chloride: 107 mmol/L (ref 98–111)
Creatinine, Ser: 1.7 mg/dL — ABNORMAL HIGH (ref 0.44–1.00)
GFR, Estimated: 27 mL/min — ABNORMAL LOW (ref >60)
Glucose, Bld: 123 mg/dL — ABNORMAL HIGH (ref 70–99)
Potassium: 5.3 mmol/L — ABNORMAL HIGH (ref 3.5–5.1)
Sodium: 134 mmol/L — ABNORMAL LOW (ref 135–145)

## 2023-04-14 LAB — GLUCOSE, CAPILLARY
Glucose-Capillary: 115 mg/dL — ABNORMAL HIGH (ref 70–99)
Glucose-Capillary: 103 mg/dL — ABNORMAL HIGH (ref 70–99)
Glucose-Capillary: 134 mg/dL — ABNORMAL HIGH (ref 70–99)
Glucose-Capillary: 144 mg/dL — ABNORMAL HIGH (ref 70–99)
Glucose-Capillary: 186 mg/dL — ABNORMAL HIGH (ref 70–99)

## 2023-04-14 LAB — PREPARE RBC (CROSSMATCH)

## 2023-04-14 MED ORDER — ASPIRIN 81 MG PO CHEW: 81.0000 mg | CHEWABLE_TABLET | Freq: Two times a day (BID) | ORAL | 0 refills | Status: DC

## 2023-04-14 MED ORDER — SODIUM CHLORIDE 0.9% IV SOLUTION: Freq: Once | INTRAVENOUS | Status: AC

## 2023-04-14 MED ORDER — HYDROCODONE-ACETAMINOPHEN 5-325 MG PO TABS
1.0000 | ORAL_TABLET | ORAL | 0 refills | Status: AC | PRN
Start: 2023-04-14 — End: 2023-04-21

## 2023-04-14 MED ORDER — ALUM & MAG HYDROXIDE-SIMETH 200-200-20 MG/5ML PO SUSP
15.0000 mL | Freq: Four times a day (QID) | ORAL | Status: DC | PRN
  Administered 2023-04-14: 20 mL via ORAL
  Filled 2023-04-14: qty 30

## 2023-04-14 NOTE — Progress Notes (Signed)
Daily Progress Note   Patient Name: Mikayla [name]       Date: 04/14/2023 DOB: May 09, 1926  Age: 89 y.o. MRN#: [id number] Attending Physician: Glade Lloyd, MD Primary Care Physician: Caesar Bookman, NP Admit Date: 04/12/2023  Reason for Consultation/Follow-up: Establishing goals of care  Subjective: Patient sitting up in chair eating. Reports she is doing well. Daughter at bedside.  Length of Stay: 2  Current Medications: Scheduled Meds:   sodium chloride   Intravenous Once   docusate sodium  100 mg Oral BID   enoxaparin (LOVENOX) injection  30 mg Subcutaneous Q24H   ezetimibe  10 mg Oral Daily   insulin aspart  0-9 Units Subcutaneous Q4H   levothyroxine  50 mcg Oral QAC breakfast   mometasone-formoterol  2 puff Inhalation BID   senna  1 tablet Oral BID    Continuous Infusions:   PRN Meds: HYDROcodone-acetaminophen, HYDROcodone-acetaminophen, melatonin, menthol-cetylpyridinium **OR** phenol, methocarbamol **OR** methocarbamol (ROBAXIN) injection, metoCLOPramide **OR** metoCLOPramide (REGLAN) injection, morphine injection, ondansetron **OR** ondansetron (ZOFRAN) IV, polyethylene glycol, sodium phosphate, sorbitol  Physical Exam Vitals reviewed.  HENT:     Head: Normocephalic and atraumatic.  Cardiovascular:     Rate and Rhythm: Normal rate.  Pulmonary:     Effort: Pulmonary effort is normal.  Skin:    General: Skin is warm and dry.  Neurological:     Mental Status: She is alert and oriented to person, place, and time.  Psychiatric:        Mood and Affect: Mood normal.        Behavior: Behavior normal.             Vital Signs: BP (!) 162/41 (BP Location: Right Arm)   Pulse 76   Temp 98.3 F (36.8 C) (Oral)   Resp 18   Ht 4\' 11"  (1.499 m)   Wt 52.2 kg    SpO2 99%   BMI 23.23 kg/m  SpO2: SpO2: 99 % O2 Device: O2 Device: Room Air O2 Flow Rate:          Patient Active Problem List   Diagnosis Date Noted   Closed left hip fracture (HCC) 04/12/2023   Acute on chronic diastolic heart failure (HCC)    Hyponatremia    Acute ST elevation myocardial infarction (STEMI) of inferior wall (HCC) 01/15/2022  Right coronary artery occlusion (HCC)    Carotid stenosis, asymptomatic, left 06/19/2019   Carotid bruit 06/10/2019   DOE (dyspnea on exertion) 05/25/2019   Hyperkalemia 05/25/2019   AKI (acute kidney injury) (HCC) 05/25/2019   Chronic renal disease, stage III (HCC) 05/21/2019   Anemia, normocytic normochromic 12/19/2017   Acute upper respiratory infection 07/25/2016   Hypothyroidism, acquired 05/04/2016   Fatigue 09/22/2014   Primary hypertension 03/03/2014   Back pain 02/04/2012   Mild intermittent extrinsic asthma without complication 08/06/2009   PELVIC MASS 06/25/2008   CYSTITIS, ACUTE 06/09/2008   Essential hypertension, benign 09/12/2007   Type 2 diabetes mellitus with obesity (HCC) 06/10/2007   Hypercholesteremia 06/10/2007   OBESITY 06/10/2007   RHINITIS 06/10/2007   Osteoporosis/osteopenia increased risk 06/10/2007    Palliative Care Assessment & Plan   Patient Profile: 89 y.o. female  with past medical history of hypertension, hypercholesterolemia, type 2 diabetes on metformin, stage IIIa-b chronic kidney disease, chronic hyponatremia, inferior STEMI on 01/15/2022 SB angioplasty to RCA, and chronic diastolic heart failure admitted on 04/12/2023 after a fall at home.    She was brought to the emergency department at [facility], where x-rays revealed a left comminuted subtrochanteric femur fracture. She was admitted by the hospitalist to [facility]. Orthopedic consultation was placed for management of her left subtrochanteric femur fracture.   Today's Discussion: Patient was alert and oriented today and  able to participate in conversation today. A discussion was had today regarding advanced directives. Concepts specific to code status and scope of care was had. Recommended consideration of DNR status, understanding evidenced-based poor outcomes in similar hospitalized patients, as the cause of the arrest is likely associated with chronic/terminal disease rather than a reversible acute cardio-pulmonary event. Patient changed her code status to do not attempt resuscitation. We discussed scopes of care and patient shared she would never want to be intubated. Patient changed her scope of care to limited. She also shared she would not want a feeding tube or artificial hydration to prolong her life.  I introduced and discussed a MOST form as a way to further document her wishes especially if she goes to a skilled nursing facility for rehab. At this time she does not want to complete a MOST form.   Patient's daughter is relieved her mother shared her wishes and she plans to encourage her to complete a MOST form.  Recommendations/Plan: DNR/DNI Limited scope of treatment Encouraged continued conversation between the patient and her children re: goals of care Likely SNF rehab at discharge PMT follow up   Code Status:    Code Status Orders  (From admission, onward)           Start     Ordered   04/14/23 1538  Do not attempt resuscitation (DNR)- Limited -Do Not Intubate (DNI)  (Code Status)  Continuous       Question Answer Comment  If pulseless and not breathing No CPR or chest compressions.   In Pre-Arrest Conditions (Patient Is Breathing and Has A Pulse) Do not intubate. Provide all appropriate non-invasive medical interventions. Avoid ICU transfer unless indicated or required.   Consent: Discussion documented in EHR or advanced directives reviewed      04/14/23 1538         Extensive chart review has been completed prior to seeing the patient including labs, vital signs, imaging,  progress/consult notes, orders, medications, and available advance directive documents.  Care plan was discussed with bedside RN and Dr.  Alekh  Time spent: 50 minutes  Thank you for allowing the Palliative Medicine Team to assist in the care of this patient.    Sherryll Burger, NP  Please contact Palliative Medicine Team phone at [phone] for questions and concerns.

## 2023-04-14 NOTE — [place] FL2 (Signed)
[place] MEDICAID FL2 LEVEL OF CARE FORM     IDENTIFICATION  Patient Name: Mikayla [name] Birthdate: 1925-10-18 Sex: female Admission Date (Current Location): 04/12/2023  [facility] and [place] Number:  Producer, television/film/video and Address:  [facility] [place], [address] [place], [place] [address]      Provider Number: [id number]  Attending Physician Name and Address:  Glade Lloyd, MD  Relative Name and Phone Number:  [name] (daughter) [phone]    Current Level of Care: Hospital Recommended Level of Care: Skilled Nursing Facility Prior Approval Number:    Date Approved/Denied:   PASRR Number: [id number]  Discharge Plan: SNF    Current Diagnoses: Patient Active Problem List   Diagnosis Date Noted   Closed left hip fracture (HCC) 04/12/2023   Acute on chronic diastolic heart failure (HCC)    Hyponatremia    Acute ST elevation myocardial infarction (STEMI) of inferior wall (HCC) 01/15/2022   Right coronary artery occlusion (HCC)    Carotid stenosis, asymptomatic, left 06/19/2019   Carotid bruit 06/10/2019   DOE (dyspnea on exertion) 05/25/2019   Hyperkalemia 05/25/2019   AKI (acute kidney injury) (HCC) 05/25/2019   Chronic renal disease, stage III (HCC) 05/21/2019   Anemia, normocytic normochromic 12/19/2017   Acute upper respiratory infection 07/25/2016   Hypothyroidism, acquired 05/04/2016   Fatigue 09/22/2014   Primary hypertension 03/03/2014   Back pain 02/04/2012   Mild intermittent extrinsic asthma without complication 08/06/2009   PELVIC MASS 06/25/2008   CYSTITIS, ACUTE 06/09/2008   Essential hypertension, benign 09/12/2007   Type 2 diabetes mellitus with obesity (HCC) 06/10/2007   Hypercholesteremia 06/10/2007   OBESITY 06/10/2007   RHINITIS 06/10/2007   Osteoporosis/osteopenia increased risk 06/10/2007    Orientation RESPIRATION BLADDER Height & Weight     Self, Time, Situation, Place  Normal Continent Weight: 115  lb (52.2 kg) Height:  4\' 11"  (149.9 cm)  BEHAVIORAL SYMPTOMS/MOOD NEUROLOGICAL BOWEL NUTRITION STATUS      Continent (WDL) Diet (Please see discharge summary)  AMBULATORY STATUS COMMUNICATION OF NEEDS Skin   Extensive Assist Verbally Other (Comment) (Ecchymosis,arm,Bil.,Erythema,arm,Bil.,Wound/Incision LDAs,Incision Closed,Hip,Left)                       Personal Care Assistance Level of Assistance  Bathing, Feeding, Dressing Bathing Assistance: Maximum assistance Feeding assistance: Limited assistance Dressing Assistance: Maximum assistance     Functional Limitations Info  Sight, Hearing, Speech   Hearing Info: Impaired Speech Info: Adequate    SPECIAL CARE FACTORS FREQUENCY  PT (By licensed PT), OT (By licensed OT)     PT Frequency: 5x min weekly OT Frequency: 5x min weekly            Contractures Contractures Info: Not present    Additional Factors Info  Code Status, Allergies, Insulin Sliding Scale Code Status Info: FULL Allergies Info: Amoxil (amoxicillin),Isosorb Dinitrate-hydralazine,Lipitor (atorvastatin),Motrin (ibuprofen),Statins   Insulin Sliding Scale Info: insulin aspart (novoLOG) injection 0-9 Units every 4 hours       Current Medications (04/14/2023):  This is the current hospital active medication list Current Facility-Administered Medications  Medication Dose Route Frequency Provider Last Rate Last Admin   0.9 %  sodium chloride infusion (Manually program via Guardrails IV Fluids)   Intravenous Once Glade Lloyd, MD       docusate sodium (COLACE) capsule 100 mg  100 mg Oral BID Samson Frederic, MD   100 mg at 04/14/23 0823   enoxaparin (LOVENOX) injection 30 mg  30  mg Subcutaneous Q24H Samson Frederic, MD   30 mg at 04/14/23 8469   ezetimibe (ZETIA) tablet 10 mg  10 mg Oral Daily Swinteck, Arlys John, MD   10 mg at 04/14/23 6295   HYDROcodone-acetaminophen (NORCO) 7.5-325 MG per tablet 1-2 tablet  1-2 tablet Oral Q4H PRN Samson Frederic, MD        HYDROcodone-acetaminophen (NORCO/VICODIN) 5-325 MG per tablet 1-2 tablet  1-2 tablet Oral Q4H PRN Samson Frederic, MD   1 tablet at 04/14/23 0035   insulin aspart (novoLOG) injection 0-9 Units  0-9 Units Subcutaneous Q4H Swinteck, Arlys John, MD       levothyroxine (SYNTHROID) tablet 50 mcg  50 mcg Oral QAC breakfast Samson Frederic, MD   50 mcg at 04/14/23 2841   melatonin tablet 5 mg  5 mg Oral QHS PRN Samson Frederic, MD   5 mg at 04/13/23 0134   menthol-cetylpyridinium (CEPACOL) lozenge 3 mg  1 lozenge Oral PRN Swinteck, Arlys John, MD       Or   phenol (CHLORASEPTIC) mouth spray 1 spray  1 spray Mouth/Throat PRN Swinteck, Arlys John, MD       methocarbamol (ROBAXIN) tablet 500 mg  500 mg Oral Q6H PRN Samson Frederic, MD   500 mg at 04/14/23 3244   Or   methocarbamol (ROBAXIN) injection 500 mg  500 mg Intravenous Q6H PRN Swinteck, Arlys John, MD       metoCLOPramide (REGLAN) tablet 5-10 mg  5-10 mg Oral Q8H PRN Swinteck, Arlys John, MD       Or   metoCLOPramide (REGLAN) injection 5-10 mg  5-10 mg Intravenous Q8H PRN Swinteck, Arlys John, MD       mometasone-formoterol (DULERA) 100-5 MCG/ACT inhaler 2 puff  2 puff Inhalation BID Swinteck, Arlys John, MD       morphine (PF) 2 MG/ML injection 0.5-1 mg  0.5-1 mg Intravenous Q2H PRN Swinteck, Arlys John, MD       ondansetron [facility]) tablet 4 mg  4 mg Oral Q6H PRN Swinteck, Arlys John, MD       Or   ondansetron (ZOFRAN) injection 4 mg  4 mg Intravenous Q6H PRN Swinteck, Arlys John, MD   4 mg at 04/13/23 1527   polyethylene glycol (MIRALAX / GLYCOLAX) packet 17 g  17 g Oral Daily PRN Swinteck, Arlys John, MD       senna (SENOKOT) tablet 8.6 mg  1 tablet Oral BID Samson Frederic, MD   8.6 mg at 04/14/23 0102   sodium phosphate (FLEET) enema 1 enema  1 enema Rectal Once PRN Swinteck, Arlys John, MD       sorbitol 70 % solution 30 mL  30 mL Oral Daily PRN Samson Frederic, MD         Discharge Medications: Please see discharge summary for a list of discharge medications.  Relevant Imaging  Results:  Relevant Lab Results:   Additional Information [id number]  Delilah Shan, LCSWA

## 2023-04-14 NOTE — TOC Initial Note (Signed)
Transition of Care [facility]) - Initial/Assessment Note    Patient Details  Name: Mikayla [name] MRN: [id number] Date of Birth: 01/21/1926  [facility]) CM/SW Contact:    Delilah Shan, LCSWA Phone Number: 04/14/2023, 12:32 PM  Clinical Narrative:                  CSW received consult for possible SNF placement at time of discharge. CSW spoke with patient regarding PT recommendation of SNF placement at time of discharge. Patient reports PTA she comes from home with daughter. Patient expressed understanding of PT recommendation and is agreeable to SNF placement at time of discharge. Patient gave CSW permission to fax out initial referral near the [facility] area for possible SNF placement.CSW discussed insurance authorization process and will provide Medicare SNF ratings list with accepted SNF bed offers when available. No further questions reported at this time. CSW to continue to follow and assist with discharge planning needs.   Expected Discharge Plan: Skilled Nursing Facility Barriers to Discharge: Continued Medical Work up   Patient Goals and CMS Choice Patient states their goals for this hospitalization and ongoing recovery are:: SNF CMS Medicare.gov Compare Post Acute Care list provided to:: Patient Choice offered to / list presented to : Patient      Expected Discharge Plan and Services In-house Referral: Clinical Social Work     Living arrangements for the past 2 months: Single Family Home                                      Prior Living Arrangements/Services Living arrangements for the past 2 months: Single Family Home Lives with:: Adult Children (Lives with daughter) Patient language and need for interpreter reviewed:: Yes Do you feel safe going back to the place where you live?: No   SNF  Need for Family Participation in Patient Care: Yes (Comment) Care giver support system in place?: Yes (comment)   Criminal Activity/Legal Involvement  Pertinent to Current Situation/Hospitalization: No - Comment as needed  Activities of Daily Living   ADL Screening (condition at time of admission) Independently performs ADLs?: Yes (appropriate for developmental age) Is the patient deaf or have difficulty hearing?: Yes Does the patient have difficulty seeing, even when wearing glasses/contacts?: No Does the patient have difficulty concentrating, remembering, or making decisions?: No  Permission Sought/Granted Permission sought to share information with : Case Manager, Magazine features editor, Family Supports Permission granted to share information with : Yes, Verbal Permission Granted  Share Information with NAME: [name]  Permission granted to share info w AGENCY: SNF  Permission granted to share info w Relationship: daughter  Permission granted to share info w Contact Information: [name] [phone]  Emotional Assessment Appearance:: Appears stated age Attitude/Demeanor/Rapport: Gracious Affect (typically observed): Calm Orientation: : Oriented to Self, Oriented to Place, Oriented to  Time, Oriented to Situation Alcohol / Substance Use: Not Applicable Psych Involvement: No (comment)  Admission diagnosis:  Closed left hip fracture (HCC) [S72.002A] Closed displaced subtrochanteric fracture of left femur, initial encounter (HCC) [S72.22XA] Patient Active Problem List   Diagnosis Date Noted   Closed left hip fracture (HCC) 04/12/2023   Acute on chronic diastolic heart failure (HCC)    Hyponatremia    Acute ST elevation myocardial infarction (STEMI) of inferior wall (HCC) 01/15/2022   Right coronary artery occlusion (HCC)    Carotid stenosis, asymptomatic, left 06/19/2019   Carotid bruit 06/10/2019  DOE (dyspnea on exertion) 05/25/2019   Hyperkalemia 05/25/2019   AKI (acute kidney injury) (HCC) 05/25/2019   Chronic renal disease, stage III (HCC) 05/21/2019   Anemia, normocytic normochromic 12/19/2017   Acute  upper respiratory infection 07/25/2016   Hypothyroidism, acquired 05/04/2016   Fatigue 09/22/2014   Primary hypertension 03/03/2014   Back pain 02/04/2012   Mild intermittent extrinsic asthma without complication 08/06/2009   PELVIC MASS 06/25/2008   CYSTITIS, ACUTE 06/09/2008   Essential hypertension, benign 09/12/2007   Type 2 diabetes mellitus with obesity (HCC) 06/10/2007   Hypercholesteremia 06/10/2007   OBESITY 06/10/2007   RHINITIS 06/10/2007   Osteoporosis/osteopenia increased risk 06/10/2007   PCP:  Caesar Bookman, NP Pharmacy:   [facility] - [place], [place] - [address] [place] [address] Phone: [phone] Fax: [phone]     Social Determinants of Health (SDOH) Social History: SDOH Screenings   Food Insecurity: No Food Insecurity (04/12/2023)  Housing: Low Risk  (04/12/2023)  Transportation Needs: No Transportation Needs (04/12/2023)  Utilities: Not At Risk (04/12/2023)  Depression (PHQ2-9): Low Risk  (01/05/2023)  Tobacco Use: Low Risk  (04/13/2023)   SDOH Interventions:     Readmission Risk Interventions    01/17/2022   12:53 PM  Readmission Risk Prevention Plan  Transportation Screening Complete  PCP or Specialist Appt within 5-7 Days Complete  Home Care Screening Complete  Medication Review (RN CM) Complete

## 2023-04-14 NOTE — Progress Notes (Signed)
    Subjective:  Patient reports pain as mild to moderate.  Denies N/V/CP/SOB/Abd pain. She reports some heel pain this morning and pain with movement of her leg. Reassured her no wounds on heels.   Objective:   VITALS:   Vitals:   04/13/23 1600 04/13/23 2018 04/14/23 0441 04/14/23 0819  BP: (!) 150/47 (!) 143/42 (!) 153/47 (!) 150/79  Pulse: (!) 58 64 72 74  Resp: 16 16 18 18   Temp: 97.8 F (36.6 C) 98 F (36.7 C) 97.7 F (36.5 C) 98 F (36.7 C)  TempSrc: Oral  Oral   SpO2: 97% 96% 94% 93%  Weight:      Height:        Patient lying in bed. NAD.  ABD soft Neurovascular intact Sensation intact distally Intact pulses distally Dorsiflexion/Plantar flexion intact Incision: dressing C/D/I No cellulitis present Compartment soft Heels no skin wounds or lesions noted.   Lab Results  Component Value Date   WBC 8.0 04/14/2023   HGB 7.0 (L) 04/14/2023   HCT 21.9 (L) 04/14/2023   MCV 93.2 04/14/2023   PLT 150 04/14/2023   BMET    Component Value Date/Time   NA 134 (L) 04/14/2023 0321   NA 137 04/25/2022 1232   K 5.3 (H) 04/14/2023 0321   CL 107 04/14/2023 0321   CO2 22 04/14/2023 0321   GLUCOSE 123 (H) 04/14/2023 0321   GLUCOSE 126 (H) 04/23/2006 1043   BUN 45 (H) 04/14/2023 0321   BUN 36 04/25/2022 1232   CREATININE 1.70 (H) 04/14/2023 0321   CREATININE 1.79 (H) 01/05/2023 1131   CALCIUM 8.5 (L) 04/14/2023 0321   EGFR 26 (L) 01/05/2023 1131   EGFR 38 (L) 04/25/2022 1232   GFRNONAA 27 (L) 04/14/2023 0321     Assessment/Plan: 1 Day Post-Op   Principal Problem:   Closed left hip fracture (HCC)   TDWB LLE with walker DVT ppx: Lovenox and   , SCDs, TEDS PO pain control PT/OT: PT to come by today.  Dispo: Patient under care of the medical team, disposition per their recommendation. Pain medication and DVT ppx printed in chart.    Clois Dupes, PA-C 04/14/2023, 9:43 AM   [facility]  [place] [address], [address], [place], [place]  [address] Phone: [phone] [email or web address] Facebook  [employer]

## 2023-04-14 NOTE — Progress Notes (Signed)
Patient refused blood transfusion. She wants to discuss with her daughter first and requested recheck of hgb tomorrow. Dr. Hanley Ben Kshitiz made aware.

## 2023-04-14 NOTE — Progress Notes (Signed)
Physical Therapy Treatment Patient Details Name: Mikayla [name] MRN: [id number] DOB: [birth year] Today's Date: 04/14/2023   History of Present Illness 89 y.o. female [facility] 04/12/23 after tripping and falling. Pt with L subtrochanteric femur fx, s/p L femur IMN nail on 11/29. PMHx:  HTN, hypercholesterolemia, T2DM, CKD, inferior STEMI on 01/15/2022 SB angioplasty to RCA, chronic diastolic heart failure, vision loss, HOH    PT Comments  Pt received on BSC in care of nursing staff, pt/staff requesting assist for transfer from [facility] to bed as pt fatigued after pivoting and has difficulty complying with LLE TDWB status due to pain/fatigue. Pt/family and staff instructed on pt's LLE TDWB status and PTA assisted to instruct on lift pad placement while pt on [facility], nursing staff present to provide hygiene assist (totalA) after toileting. Pt needing totalA +2 for lift back to bed from [facility] with maxi-move and for bed mobility due to pt c/o pain and guarding/anxiety during transfer needing BLE and upper body support to return to supine. LLE elevated for pain/edema mgmt and ice applied for comfort. RN/MD notified pt asking for update from surgical team about TDWB precautions and how long she will need to comply with them. Pt will continue to benefit from skilled rehab in a low to moderate intensity post acute setting <3 hours/day to maximize functional gains before returning home.     If plan is discharge home, recommend the following: A lot of help with bathing/dressing/bathroom;Assistance with cooking/housework;Assist for transportation;Help with stairs or ramp for entrance;Two people to help with walking and/or transfers   Can travel by private vehicle     No  Equipment Recommendations  None recommended by PT (TBD, currently pt needing hospital bed, mechanical lift, wheelchair)    Recommendations for Other Services       Precautions / Restrictions Precautions Precautions:  Fall Precaution Comments: HoH Restrictions Weight Bearing Restrictions: Yes LLE Weight Bearing: Touchdown weight bearing     Mobility  Bed Mobility Overal bed mobility: Needs Assistance Bed Mobility: Sit to Supine       Sit to supine: Total assist, +2 for physical assistance   General bed mobility comments: pt with helicopter method w/BLE and trunk support o return to supine due to high guarding and anticipation of pain w/ movement.    Transfers Overall transfer level: Needs assistance   Transfers: Bed to chair/wheelchair/BSC             General transfer comment: Pt received on BSC. PTA assisted nursing staff to place mechanical lift pad under pt via lateral leans while seated on BSC and assisted them to maneuver lift as pt had difficulty maintaining TDWB per nursing staff and needed +3 assist to pivot from chair to [facility] prior to PTA arrival to the room. Transfer via Lift Equipment: Maximove  Ambulation/Gait               General Gait Details: Deferred, pt unable to maintain balance w/o MaxAx2, does not adhere to WB precautions   Stairs             Wheelchair Mobility     Tilt Bed    Modified Rankin (Stroke Patients Only)       Balance Overall balance assessment: Mild deficits observed, not formally tested  Cognition Arousal: Alert Behavior During Therapy: WFL for tasks assessed/performed, Anxious Overall Cognitive Status: Within Functional Limits for tasks assessed                                 General Comments: Very HOH. Increased time to respond to questions. Fearful of falling. Daughter present with concern about why staff using lift in PM when they did not use in AM or previous date, PTA provided education on pt's LLE TDWB precs and pt daughter was asking for more info on why surgeon chose to use TDWB and how long precs would be in place, RN notified pt daughter wants  update from surgical team when they have time as family was not here when they rounded on pt likely in AM.        Exercises Other Exercises Other Exercises: supine BLE AAROM: ankle pumps x10 reps ea    General Comments General comments (skin integrity, edema, etc.): surgical site appears c/d/i, ice placed over LLE lateral area between hip and knee, pt reports pain is more severe distally toward her knee      Pertinent Vitals/Pain Pain Assessment Pain Assessment: Faces Faces Pain Scale: Hurts even more Pain Location: LLE Pain Descriptors / Indicators: Discomfort, Grimacing, Operative site guarding, Sore Pain Intervention(s): Limited activity within patient's tolerance, Monitored during session, Repositioned, Ice applied     PT Goals (current goals can now be found in the care plan section) Acute Rehab PT Goals Patient Stated Goal: to get better PT Goal Formulation: With patient Time For Goal Achievement: 04/28/23 Progress towards PT goals: Progressing toward goals    Frequency    Min 1X/week      PT Plan         AM-PAC PT "6 Clicks" Mobility   Outcome Measure  Help needed turning from your back to your side while in a flat bed without using bedrails?: A Lot Help needed moving from lying on your back to sitting on the side of a flat bed without using bedrails?: Total Help needed moving to and from a bed to a chair (including a wheelchair)?: Total Help needed standing up from a chair using your arms (e.g., wheelchair or bedside chair)?: Total Help needed to walk in hospital room?: Total Help needed climbing 3-5 steps with a railing? : Total 6 Click Score: 7    End of Session Equipment Utilized During Treatment: Gait belt Activity Tolerance: Patient limited by pain;Patient limited by fatigue Patient left: with call bell/phone within reach;in bed;with bed alarm set;with family/visitor present (daughter in room) Nurse Communication: Mobility status;Need for lift  equipment;Precautions (TDWB precs also written on her board for nursing staff reinforcement) PT Visit Diagnosis: Unsteadiness on feet (R26.81);History of falling (Z91.81);Muscle weakness (generalized) (M62.81)     Time: 1610-9604 PT Time Calculation (min) (ACUTE ONLY): 37 min  Charges:    $Therapeutic Activity: 23-37 mins PT General Charges $$ ACUTE PT VISIT: 1 Visit                     Verlin Duke P., PTA Acute Rehabilitation Services Secure Chat Preferred 9a-5:30pm Office: [phone]    Dorathy Kinsman [facility] 04/14/2023, 5:36 PM

## 2023-04-14 NOTE — Evaluation (Signed)
Occupational Therapy Evaluation Patient Details Name: Mikayla [name] MRN: [id number] DOB: 06/22/1925 Today's Date: 04/14/2023   History of Present Illness 89 y.o. female [facility] 04/12/23 after tripping and falling. Pt with L subtrochanteric femur fx, s/p L femur IMN nail on 11/29. PMHx:  HTN, hypercholesterolemia, T2DM, CKD, inferior STEMI on 01/15/2022 SB angioplasty to RCA, chronic diastolic heart failure, vision loss, HOH   Clinical Impression   Patient is s/p L femur IMN surgery resulting in functional limitations due to the deficits listed below (see OT problem list). Pt living indep in home with a friend that provided all driving needs (including doctor appointments). Pt denies any falls in last 6 months. Pt at this time requires total+2 max (A) with poor awareness to LLE TDWB. Pt fearful of falling and HOH. Pt needs talking on the R side to help with communication. Pt with poor vision so asking staff to get closer so that she can see lips to help with auditory. Written information will need to be large.  Patient will benefit from skilled OT acutely to increase independence and safety with ADLS to allow discharge skilled inpatient follow up therapy, <3 hours/day. .        If plan is discharge home, recommend the following: Two people to help with walking and/or transfers;Two people to help with bathing/dressing/bathroom    Functional Status Assessment  Patient has had a recent decline in their functional status and demonstrates the ability to make significant improvements in function in a reasonable and predictable amount of time.  Equipment Recommendations  BSC/3in1;Wheelchair (measurements OT);Wheelchair cushion (measurements OT);Hospital bed;Hoyer lift    Recommendations for Other Services       Precautions / Restrictions Precautions Precautions: Fall Restrictions Weight Bearing Restrictions: Yes LLE Weight Bearing: Touchdown weight bearing       Mobility Bed Mobility Overal bed mobility: Needs Assistance Bed Mobility: Supine to Sit     Supine to sit: +2 for physical assistance, Total assist, HOB elevated     General bed mobility comments: pt with helicopter method bil LE support to come to eob. pt anticipating pain and guided initially with purse lip breathing. pt demonstratse and then does not follow through during transfer. pt fearful of falling initially and gradually progressed to increased tolerance with rapport    Transfers Overall transfer level: Needs assistance Equipment used: 2 person hand held assist Transfers: Sit to/from Stand, Bed to chair/wheelchair/BSC Sit to Stand: +2 physical assistance, Max assist, From elevated surface Stand pivot transfers: +2 physical assistance, Max assist, +2 safety/equipment, From elevated surface         General transfer comment: pt needs (A) and guarding on LLE. pt does not maintain TDWB      Balance Overall balance assessment: Mild deficits observed, not formally tested                                         ADL either performed or assessed with clinical judgement   ADL Overall ADL's : Needs assistance/impaired Eating/Feeding: Supervision/ safety;Sitting   Grooming: Set up;Sitting               Lower Body Dressing: Maximal assistance Lower Body Dressing Details (indicate cue type and reason): supine to don socks Toilet Transfer: +2 for physical assistance;Maximal assistance;Stand-pivot;BSC/3in1 Toilet Transfer Details (indicate cue type and reason): pt requires (A) to pivot and not stepping  toward commode Toileting- Clothing Manipulation and Hygiene: Total assistance Toileting - Clothing Manipulation Details (indicate cue type and reason): face to face transfer with second therapist peri care       General ADL Comments: pt completed bed to [facility] to chair this session     Vision Baseline Vision/History: 1 Wears glasses;6 Macular  Degeneration (L eye #6) Ability to See in Adequate Light: 1 Impaired Patient Visual Report: Blurring of vision Vision Assessment?: Vision impaired- to be further tested in functional context Additional Comments: pt reports cataracts and film on R eye at baseline, L eye macular degeneration     Perception         Praxis         Pertinent Vitals/Pain Pain Assessment Pain Assessment: Faces Faces Pain Scale: Hurts even more Pain Location: LLE Pain Descriptors / Indicators: Discomfort, Grimacing, Headache Pain Intervention(s): Monitored during session, Premedicated before session, Repositioned, Limited activity within patient's tolerance, Ice applied     Extremity/Trunk Assessment Upper Extremity Assessment Upper Extremity Assessment: Right hand dominant;RUE deficits/detail RUE Deficits / Details: reports difficulty lifting arm since fall. pt with pain up until 45 degrees. pt (A)ing with L UE and then once at 80 degrees able to complete shoulder flexoin without (A) RUE Coordination: WNL   Lower Extremity Assessment Lower Extremity Assessment: Defer to PT evaluation;LLE deficits/detail LLE Deficits / Details: guarding and anticipating pain, holding in extension.   Cervical / Trunk Assessment Cervical / Trunk Assessment: Kyphotic   Communication Communication Communication: Hearing impairment   Cognition Arousal: Alert Behavior During Therapy: WFL for tasks assessed/performed Overall Cognitive Status: Within Functional Limits for tasks assessed                                 General Comments: HOH and needs increased time to answer. pt repeats information to ensure that she understands the question.     General Comments  incision is covered and dry intact at this time    Exercises     Shoulder Instructions      Home Living Family/patient expects to be discharged to:: Private residence Living Arrangements: Alone Available Help at Discharge:  Family;Available PRN/intermittently Type of Home: House Home Access: Stairs to enter [employer] of Steps: 2 Entrance Stairs-Rails: None Home Layout: One level     Bathroom Shower/Tub: Chief Strategy Officer: Standard     Home Equipment: Cane - single point;Grab bars - tub/shower          Prior Functioning/Environment Prior Level of Function : Needs assist;History of Falls (last six months)             Mobility Comments: Uses cane outside, no AD in the house. ADLs Comments: Has a driver x1 a week for appointments, impaired vision L eye macular degenration, R eye catarats        OT Problem List: Decreased activity tolerance;Impaired balance (sitting and/or standing);Decreased safety awareness;Decreased knowledge of use of DME or AE;Decreased knowledge of precautions;Obesity;Pain      OT Treatment/Interventions: Self-care/ADL training;Therapeutic exercise;Energy conservation;DME and/or AE instruction;Manual therapy;Modalities;Therapeutic activities;Patient/family education;Balance training    OT Goals(Current goals can be found in the care plan section) Acute Rehab OT Goals Patient Stated Goal: to be able to get up again OT Goal Formulation: With patient Time For Goal Achievement: 04/27/23 Potential to Achieve Goals: Good  OT Frequency: Min 1X/week    Co-evaluation PT/OT/SLP Co-Evaluation/Treatment: Yes Reason for Co-Treatment: For  patient/therapist safety;To address functional/ADL transfers;Complexity of the patient's impairments (multi-system involvement)   OT goals addressed during session: ADL's and self-care;Proper use of Adaptive equipment and DME;Strengthening/ROM      AM-PAC OT "6 Clicks" Daily Activity     Outcome Measure Help from another person eating meals?: A Little Help from another person taking care of personal grooming?: A Little Help from another person toileting, which includes using toliet, bedpan, or urinal?: A  Little Help from another person bathing (including washing, rinsing, drying)?: A Lot Help from another person to put on and taking off regular upper body clothing?: A Little Help from another person to put on and taking off regular lower body clothing?: A Lot 6 Click Score: 16   End of Session Equipment Utilized During Treatment: Gait belt Nurse Communication: Mobility status;Precautions;Weight bearing status;Need for lift equipment  Activity Tolerance: Patient tolerated treatment well Patient left: in chair;with call bell/phone within reach;with chair alarm set (hoyer lift pad placed for RN staff)  OT Visit Diagnosis: Unsteadiness on feet (R26.81);Muscle weakness (generalized) (M62.81);Pain Pain - Right/Left: Left Pain - part of body: Leg                Time: 0981-1914 OT Time Calculation (min): 32 min Charges:  OT General Charges $OT Visit: 1 Visit OT Evaluation $OT Eval Moderate Complexity: 1 Mod   Brynn, OTR/L  Acute Rehabilitation Services Office: [phone] .   Mateo Flow 04/14/2023, 9:56 AM

## 2023-04-14 NOTE — Progress Notes (Signed)
PROGRESS NOTE    Mikayla [name]  ZOX:[id number] DOB: 1925/09/10 DOA: 04/12/2023 PCP: Caesar Bookman, NP   Brief Narrative:  89 y.o. female with medical history significant for type 2 diabetes, essential hypertension, hypothyroidism, HFpEF presented with a fall and was found to have proximal left femur fracture.  She underwent surgical intervention on 04/13/2023 by orthopedics.  Assessment & Plan:   Acute proximal left femur fracture status post mechanical fall -underwent surgical intervention on 04/13/2023 by orthopedics.  Wound care/DVT prophylaxis/pain management/activity as per orthopedics recommendations -Fall precautions -PT/OT eval -Pain management  Acute blood loss anemia on top of anemia of chronic disease -Patient probably has anemia of chronic disease from chronic illnesses.  Hemoglobin has dropped down to 7 today.  Will transfuse 1 unit packed red cells.  Monitor H&H.  Essential hypertension -Might have to resume antihypertensives.  Monitor blood pressure  Hyperlipidemia Continue ezetimibe  Hypothyroidism Continue levothyroxine  Diabetes mellitus type 2 -Metformin on hold.  Continue CBGs with SSI.  Last A1c was 6.1. -Carb modified diet  Goals of care -Currently listed as full code.  Palliative care consulted for goals of care discussion.   DVT prophylaxis: Lovenox Code Status: Full Family Communication: None at bedside Disposition Plan: Status is: Inpatient Remains inpatient appropriate because: Of severity of illness    Consultants: Orthopedics  Procedures: As above  Antimicrobials: Perioperative   Subjective: Patient seen and examined at bedside.  Hard of hearing.  Complains of intermittent left hip pain.  No fever, vomiting, shortness of breath reported.  Objective: Vitals:   04/13/23 1600 04/13/23 2018 04/14/23 0441 04/14/23 0819  BP: (!) 150/47 (!) 143/42 (!) 153/47 (!) 150/79  Pulse: (!) 58 64 72 74  Resp: 16 16 18 18   Temp: 97.8  F (36.6 C) 98 F (36.7 C) 97.7 F (36.5 C) 98 F (36.7 C)  TempSrc: Oral  Oral   SpO2: 97% 96% 94% 93%  Weight:      Height:        Intake/Output Summary (Last 24 hours) at 04/14/2023 1009 Last data filed at 04/13/2023 2025 Gross per 24 hour  Intake 1218.78 ml  Output 101 ml  Net 1117.78 ml   Filed Weights   04/12/23 1841  Weight: 52.2 kg    Examination:  General exam: Appears calm and comfortable.  On room air.  Hard of hearing.  Elderly female sitting on chair. Respiratory system: Bilateral decreased breath sounds at bases with scattered crackles Cardiovascular system: S1 & S2 heard, Rate controlled Gastrointestinal system: Abdomen is nondistended, soft and nontender. Normal bowel sounds heard. Extremities: No cyanosis, clubbing, edema  Central nervous system: Awake, slow to respond.  Poor historian.  No focal neurological deficits. Moving extremities Skin: No rashes, lesions or ulcers Psychiatry: Flat affect.  Not agitated.   Data Reviewed: I have personally reviewed following labs and imaging studies  CBC: Recent Labs  Lab 04/12/23 1906 04/13/23 0603 04/13/23 1252 04/14/23 0321  WBC 9.7 10.8* 10.4 8.0  NEUTROABS 7.0  --   --  6.0  HGB 9.9* 8.7* 8.3* 7.0*  HCT 30.1* 26.7* 25.9* 21.9*  MCV 92.6 92.7 94.5 93.2  PLT 210 167 172 150   Basic Metabolic Panel: Recent Labs  Lab 04/12/23 1906 04/13/23 0603 04/13/23 1252 04/14/23 0321  NA 134* 133*  --  134*  K 5.0 5.7*  --  5.3*  CL 100 103  --  107  CO2 21* 20*  --  22  GLUCOSE 162* 130*  --  123*  BUN 50* 52*  --  45*  CREATININE 1.68* 1.75* 1.88* 1.70*  CALCIUM 9.2 8.8*  --  8.5*  MG  --  2.3  --   --   PHOS  --  4.9*  --   --    GFR: Estimated Creatinine Clearance: 14.6 mL/min (A) (by C-G formula based on SCr of 1.7 mg/dL (H)). Liver Function Tests: Recent Labs  Lab 04/12/23 1906  AST 12*  ALT 7  ALKPHOS 25*  BILITOT 0.4  PROT 6.7  ALBUMIN 3.9   No results for input(s): "LIPASE",  "AMYLASE" in the last 168 hours. No results for input(s): "AMMONIA" in the last 168 hours. Coagulation Profile: No results for input(s): "INR", "PROTIME" in the last 168 hours. Cardiac Enzymes: No results for input(s): "CKTOTAL", "CKMB", "CKMBINDEX", "TROPONINI" in the last 168 hours. BNP (last 3 results) Recent Labs    04/25/22 1232  PROBNP 4,724*   HbA1C: No results for input(s): "HGBA1C" in the last 72 hours. CBG: Recent Labs  Lab 04/13/23 1631 04/13/23 2020 04/13/23 2357 04/14/23 0419 04/14/23 0819  GLUCAP 118* 127* 139* 115* 103*   Lipid Profile: No results for input(s): "CHOL", "HDL", "LDLCALC", "TRIG", "CHOLHDL", "LDLDIRECT" in the last 72 hours. Thyroid Function Tests: No results for input(s): "TSH", "T4TOTAL", "FREET4", "T3FREE", "THYROIDAB" in the last 72 hours. Anemia Panel: No results for input(s): "VITAMINB12", "FOLATE", "FERRITIN", "TIBC", "IRON", "RETICCTPCT" in the last 72 hours. Sepsis Labs: No results for input(s): "PROCALCITON", "LATICACIDVEN" in the last 168 hours.  Recent Results (from the past 240 hour(s))  Surgical pcr screen     Status: None   Collection Time: 04/12/23 11:46 PM   Specimen: Nasal Mucosa; Nasal Swab  Result Value Ref Range Status   MRSA, PCR NEGATIVE NEGATIVE Final   Staphylococcus aureus NEGATIVE NEGATIVE Final    Comment: (NOTE) The Xpert SA Assay (FDA approved for NASAL specimens in patients 87 years of age and older), is one component of a comprehensive surveillance program. It is not intended to diagnose infection nor to guide or monitor treatment. Performed at [facility], [address], [place], [place] [address]          Radiology Studies: DG Pelvis Portable  Result Date: 04/13/2023 CLINICAL DATA:  Close left subtrochanteric femur fracture. Postoperative radiographs. EXAM: PORTABLE PELVIS 1-2 VIEWS; LEFT FEMUR PORTABLE 2 VIEWS COMPARISON:  Left femur intraoperative fluoroscopy 04/13/2023, pelvis and left  femur radiographs 04/12/2023 FINDINGS: There is diffuse decreased bone mineralization. Marrow anterolateral femoroacetabular joint space narrowing. Moderate severe pubic symphysis joint space narrowing, subchondral sclerosis, peripheral osteophytosis. Mild-to-moderate right and mild left sacroiliac joint space narrowing with mild subchondral sclerosis. New long left intramedullary nail fixation of the previously seen comminuted fracture of the proximal femoral diaphysis predominantly from a superolateral to inferior medial orientation. Improved alignment with resolution of the prior mild varus angulation of the fracture. There is again a comminuted butterfly fracture fragment at the proximal lateral aspect of the fracture. No evidence of hardware failure. Moderate to severe lateral knee compartment joint space narrowing, subchondral sclerosis, peripheral osteophytosis. Moderate to severe patellofemoral joint space narrowing with moderate peripheral osteophytes. Moderate to high-grade atherosclerotic calcifications. Expected postoperative thigh subcutaneous air. Lateral proximal distal left thigh surgical staples. IMPRESSION: New long left intramedullary nail fixation of the previously seen comminuted fracture of the proximal femoral diaphysis. Improved alignment with resolution of the prior mild varus angulation of the fracture. No evidence of hardware failure. Electronically Signed   By: Windy Fast  Viola M.D.   On: 04/13/2023 12:14   DG FEMUR PORT MIN 2 VIEWS LEFT  Result Date: 04/13/2023 CLINICAL DATA:  Close left subtrochanteric femur fracture. Postoperative radiographs. EXAM: PORTABLE PELVIS 1-2 VIEWS; LEFT FEMUR PORTABLE 2 VIEWS COMPARISON:  Left femur intraoperative fluoroscopy 04/13/2023, pelvis and left femur radiographs 04/12/2023 FINDINGS: There is diffuse decreased bone mineralization. Marrow anterolateral femoroacetabular joint space narrowing. Moderate severe pubic symphysis joint space narrowing,  subchondral sclerosis, peripheral osteophytosis. Mild-to-moderate right and mild left sacroiliac joint space narrowing with mild subchondral sclerosis. New long left intramedullary nail fixation of the previously seen comminuted fracture of the proximal femoral diaphysis predominantly from a superolateral to inferior medial orientation. Improved alignment with resolution of the prior mild varus angulation of the fracture. There is again a comminuted butterfly fracture fragment at the proximal lateral aspect of the fracture. No evidence of hardware failure. Moderate to severe lateral knee compartment joint space narrowing, subchondral sclerosis, peripheral osteophytosis. Moderate to severe patellofemoral joint space narrowing with moderate peripheral osteophytes. Moderate to high-grade atherosclerotic calcifications. Expected postoperative thigh subcutaneous air. Lateral proximal distal left thigh surgical staples. IMPRESSION: New long left intramedullary nail fixation of the previously seen comminuted fracture of the proximal femoral diaphysis. Improved alignment with resolution of the prior mild varus angulation of the fracture. No evidence of hardware failure. Electronically Signed   By: Neita Garnet M.D.   On: 04/13/2023 12:14   DG FEMUR MIN 2 VIEWS LEFT  Result Date: 04/13/2023 CLINICAL DATA:  Elective surgery. EXAM: LEFT FEMUR 2 VIEWS COMPARISON:  Preoperative imaging FINDINGS: Eight fluoroscopic spot views of the left femur obtained in the operating room. Skull intra limit jet is during femoral intramedullary nail with trans trochanteric and distal locking screw fixation traversing proximal femur fracture. Fluoroscopy time 1 minutes 40 seconds. Dose 11.5 mGy IMPRESSION: Intraoperative fluoroscopy during proximal femur fracture fixation. Electronically Signed   By: Narda Rutherford M.D.   On: 04/13/2023 11:02   DG C-Arm 1-60 Min-No Report  Result Date: 04/13/2023 Fluoroscopy was utilized by the  requesting physician.  No radiographic interpretation.   DG C-Arm 1-60 Min-No Report  Result Date: 04/13/2023 Fluoroscopy was utilized by the requesting physician.  No radiographic interpretation.   DG Chest Port 1 View  Result Date: 04/12/2023 CLINICAL DATA:  [id number]. Witnessed fall injury, left lower extremity shortening and pain, unable to move left lower extremity. EXAM: LEFT FEMUR 2 VIEWS; PORTABLE CHEST - 1 VIEW; RIGHT SHOULDER - 2+ VIEW; PELVIS - 1-2 VIEW COMPARISON:  PA and lateral chest 07/14/2022, and CT abdomen and pelvis and reformats 06/24/2008 FINDINGS: Chest AP portable 8:05 p.m.: The heart is mildly enlarged. No vascular congestion is seen. The aorta is tortuous and calcified with stable mediastinum. The lungs are clear. No displaced rib fracture or pneumothorax are seen. Osteopenia, degenerative change and mild levoscoliosis thoracic spine. No new osseous findings. There is a tangle of overlying monitor wiring. Right shoulder, AP and transscapular Y-views only: Osteopenia. There is no evidence of fracture or dislocation. There are degenerative subcortical cystic changes along the humeral cuff insertion, slight spurring at the [facility] joint and inferior glenohumeral joint. There is a somewhat high-riding appearance of the humerus which could be positional or due to chronic rotator cuff arthropathy. AP pelvis single view: Osteopenia. There is an acute oblique subtrochanteric proximal left femoral fracture, with mild medial angulation of the main distal fragment and less than 1 cortex width lateral displacement. There is a butterfly comminution fragment at the lateral proximal fracture margin  which is somewhat laterally rotated. The pelvic rings are intact AP. No pelvic fracture or diastasis is seen. There are osteophytes of the SI joints, pubic symphysis. The visualized proximal right femur is unremarkable. There is mild arthrosis of both hips, pelvic enthesopathy. The iliofemoral arteries are  heavily calcified. There is advanced degenerative change of the visualized lower lumbar spine. Multiple pelvic phleboliths. AP and cross-table lateral views left femur, total of 4 films: Above-described proximal femoral fracture is redemonstrated, with mild angulation and slight displacement. No fracture is seen in the remainder of the femur. There is moderate arthrosis at the knee. No suprapatellar effusion is evident. Heavy calcification continues down the superficial femoral artery and into the popliteal and popliteal trifurcation arteries. IMPRESSION: 1. Acute oblique subtrochanteric proximal left femoral fracture with mild angulation and slight displacement. 2. No fracture is seen in the remainder of the left femur. 3. No pelvic fracture or diastasis is seen. 4. No acute radiographic chest findings. Mild cardiomegaly. 5. Osteopenia and degenerative change right shoulder without evidence of fracture. 6. Heavy aortic and peripheral vascular atherosclerosis. Electronically Signed   By: Almira Bar M.D.   On: 04/12/2023 20:46   DG Shoulder Right  Result Date: 04/12/2023 CLINICAL DATA:  [id number]. Witnessed fall injury, left lower extremity shortening and pain, unable to move left lower extremity. EXAM: LEFT FEMUR 2 VIEWS; PORTABLE CHEST - 1 VIEW; RIGHT SHOULDER - 2+ VIEW; PELVIS - 1-2 VIEW COMPARISON:  PA and lateral chest 07/14/2022, and CT abdomen and pelvis and reformats 06/24/2008 FINDINGS: Chest AP portable 8:05 p.m.: The heart is mildly enlarged. No vascular congestion is seen. The aorta is tortuous and calcified with stable mediastinum. The lungs are clear. No displaced rib fracture or pneumothorax are seen. Osteopenia, degenerative change and mild levoscoliosis thoracic spine. No new osseous findings. There is a tangle of overlying monitor wiring. Right shoulder, AP and transscapular Y-views only: Osteopenia. There is no evidence of fracture or dislocation. There are degenerative subcortical cystic  changes along the humeral cuff insertion, slight spurring at the [facility] joint and inferior glenohumeral joint. There is a somewhat high-riding appearance of the humerus which could be positional or due to chronic rotator cuff arthropathy. AP pelvis single view: Osteopenia. There is an acute oblique subtrochanteric proximal left femoral fracture, with mild medial angulation of the main distal fragment and less than 1 cortex width lateral displacement. There is a butterfly comminution fragment at the lateral proximal fracture margin which is somewhat laterally rotated. The pelvic rings are intact AP. No pelvic fracture or diastasis is seen. There are osteophytes of the SI joints, pubic symphysis. The visualized proximal right femur is unremarkable. There is mild arthrosis of both hips, pelvic enthesopathy. The iliofemoral arteries are heavily calcified. There is advanced degenerative change of the visualized lower lumbar spine. Multiple pelvic phleboliths. AP and cross-table lateral views left femur, total of 4 films: Above-described proximal femoral fracture is redemonstrated, with mild angulation and slight displacement. No fracture is seen in the remainder of the femur. There is moderate arthrosis at the knee. No suprapatellar effusion is evident. Heavy calcification continues down the superficial femoral artery and into the popliteal and popliteal trifurcation arteries. IMPRESSION: 1. Acute oblique subtrochanteric proximal left femoral fracture with mild angulation and slight displacement. 2. No fracture is seen in the remainder of the left femur. 3. No pelvic fracture or diastasis is seen. 4. No acute radiographic chest findings. Mild cardiomegaly. 5. Osteopenia and degenerative change right shoulder without evidence of fracture. 6.  Heavy aortic and peripheral vascular atherosclerosis. Electronically Signed   By: Almira Bar M.D.   On: 04/12/2023 20:46   DG Pelvis 1-2 Views  Result Date: 04/12/2023 CLINICAL  DATA:  [id number]. Witnessed fall injury, left lower extremity shortening and pain, unable to move left lower extremity. EXAM: LEFT FEMUR 2 VIEWS; PORTABLE CHEST - 1 VIEW; RIGHT SHOULDER - 2+ VIEW; PELVIS - 1-2 VIEW COMPARISON:  PA and lateral chest 07/14/2022, and CT abdomen and pelvis and reformats 06/24/2008 FINDINGS: Chest AP portable 8:05 p.m.: The heart is mildly enlarged. No vascular congestion is seen. The aorta is tortuous and calcified with stable mediastinum. The lungs are clear. No displaced rib fracture or pneumothorax are seen. Osteopenia, degenerative change and mild levoscoliosis thoracic spine. No new osseous findings. There is a tangle of overlying monitor wiring. Right shoulder, AP and transscapular Y-views only: Osteopenia. There is no evidence of fracture or dislocation. There are degenerative subcortical cystic changes along the humeral cuff insertion, slight spurring at the [facility] joint and inferior glenohumeral joint. There is a somewhat high-riding appearance of the humerus which could be positional or due to chronic rotator cuff arthropathy. AP pelvis single view: Osteopenia. There is an acute oblique subtrochanteric proximal left femoral fracture, with mild medial angulation of the main distal fragment and less than 1 cortex width lateral displacement. There is a butterfly comminution fragment at the lateral proximal fracture margin which is somewhat laterally rotated. The pelvic rings are intact AP. No pelvic fracture or diastasis is seen. There are osteophytes of the SI joints, pubic symphysis. The visualized proximal right femur is unremarkable. There is mild arthrosis of both hips, pelvic enthesopathy. The iliofemoral arteries are heavily calcified. There is advanced degenerative change of the visualized lower lumbar spine. Multiple pelvic phleboliths. AP and cross-table lateral views left femur, total of 4 films: Above-described proximal femoral fracture is redemonstrated, with mild  angulation and slight displacement. No fracture is seen in the remainder of the femur. There is moderate arthrosis at the knee. No suprapatellar effusion is evident. Heavy calcification continues down the superficial femoral artery and into the popliteal and popliteal trifurcation arteries. IMPRESSION: 1. Acute oblique subtrochanteric proximal left femoral fracture with mild angulation and slight displacement. 2. No fracture is seen in the remainder of the left femur. 3. No pelvic fracture or diastasis is seen. 4. No acute radiographic chest findings. Mild cardiomegaly. 5. Osteopenia and degenerative change right shoulder without evidence of fracture. 6. Heavy aortic and peripheral vascular atherosclerosis. Electronically Signed   By: Almira Bar M.D.   On: 04/12/2023 20:46   DG FEMUR MIN 2 VIEWS LEFT  Result Date: 04/12/2023 CLINICAL DATA:  [id number]. Witnessed fall injury, left lower extremity shortening and pain, unable to move left lower extremity. EXAM: LEFT FEMUR 2 VIEWS; PORTABLE CHEST - 1 VIEW; RIGHT SHOULDER - 2+ VIEW; PELVIS - 1-2 VIEW COMPARISON:  PA and lateral chest 07/14/2022, and CT abdomen and pelvis and reformats 06/24/2008 FINDINGS: Chest AP portable 8:05 p.m.: The heart is mildly enlarged. No vascular congestion is seen. The aorta is tortuous and calcified with stable mediastinum. The lungs are clear. No displaced rib fracture or pneumothorax are seen. Osteopenia, degenerative change and mild levoscoliosis thoracic spine. No new osseous findings. There is a tangle of overlying monitor wiring. Right shoulder, AP and transscapular Y-views only: Osteopenia. There is no evidence of fracture or dislocation. There are degenerative subcortical cystic changes along the humeral cuff insertion, slight spurring at the [facility] joint and inferior glenohumeral  joint. There is a somewhat high-riding appearance of the humerus which could be positional or due to chronic rotator cuff arthropathy. AP pelvis single  view: Osteopenia. There is an acute oblique subtrochanteric proximal left femoral fracture, with mild medial angulation of the main distal fragment and less than 1 cortex width lateral displacement. There is a butterfly comminution fragment at the lateral proximal fracture margin which is somewhat laterally rotated. The pelvic rings are intact AP. No pelvic fracture or diastasis is seen. There are osteophytes of the SI joints, pubic symphysis. The visualized proximal right femur is unremarkable. There is mild arthrosis of both hips, pelvic enthesopathy. The iliofemoral arteries are heavily calcified. There is advanced degenerative change of the visualized lower lumbar spine. Multiple pelvic phleboliths. AP and cross-table lateral views left femur, total of 4 films: Above-described proximal femoral fracture is redemonstrated, with mild angulation and slight displacement. No fracture is seen in the remainder of the femur. There is moderate arthrosis at the knee. No suprapatellar effusion is evident. Heavy calcification continues down the superficial femoral artery and into the popliteal and popliteal trifurcation arteries. IMPRESSION: 1. Acute oblique subtrochanteric proximal left femoral fracture with mild angulation and slight displacement. 2. No fracture is seen in the remainder of the left femur. 3. No pelvic fracture or diastasis is seen. 4. No acute radiographic chest findings. Mild cardiomegaly. 5. Osteopenia and degenerative change right shoulder without evidence of fracture. 6. Heavy aortic and peripheral vascular atherosclerosis. Electronically Signed   By: Almira Bar M.D.   On: 04/12/2023 20:46        Scheduled Meds:  docusate sodium  100 mg Oral BID   enoxaparin (LOVENOX) injection  30 mg Subcutaneous Q24H   ezetimibe  10 mg Oral Daily   insulin aspart  0-9 Units Subcutaneous Q4H   levothyroxine  50 mcg Oral QAC breakfast   mometasone-formoterol  2 puff Inhalation BID   senna  1 tablet  Oral BID   Continuous Infusions:        Glade Lloyd, MD [facility] 04/14/2023, 10:09 AM

## 2023-04-14 NOTE — TOC Progression Note (Signed)
Transition of Care [facility]) - Progression Note    Patient Details  Name: Mikayla [name] MRN: [id number] Date of Birth: Aug 01, 1925  Transition of Care [facility]) CM/SW Contact  Donnalee Curry, LCSWA Phone Number: 04/14/2023, 3:48 PM  Clinical Narrative:     SW informed pt's daughter wanting to speak.   SW spoke with [name] [phone]) explained process for SNF. SW emailed list of SNF's in pt area and daughter area [phone]).   Expected Discharge Plan: Skilled Nursing Facility Barriers to Discharge: Continued Medical Work up  Expected Discharge Plan and Services In-house Referral: Clinical Social Work     Living arrangements for the past 2 months: Single Family Home                                       Social Determinants of Health (SDOH) Interventions SDOH Screenings   Food Insecurity: No Food Insecurity (04/12/2023)  Housing: Low Risk  (04/12/2023)  Transportation Needs: No Transportation Needs (04/12/2023)  Utilities: Not At Risk (04/12/2023)  Depression (PHQ2-9): Low Risk  (01/05/2023)  Tobacco Use: Low Risk  (04/13/2023)    Readmission Risk Interventions    01/17/2022   12:53 PM  Readmission Risk Prevention Plan  Transportation Screening Complete  PCP or Specialist Appt within 5-7 Days Complete  Home Care Screening Complete  Medication Review (RN CM) Complete

## 2023-04-14 NOTE — Evaluation (Signed)
Physical Therapy Evaluation Patient Details Name: Mikayla [name] MRN: [id number] DOB: 1926/01/19 Today's Date: 04/14/2023  History of Present Illness  89 y.o. female [facility] 04/12/23 after tripping and falling. Pt with L subtrochanteric femur fx, s/p L femur IMN nail on 11/29. PMHx:  HTN, hypercholesterolemia, T2DM, CKD, inferior STEMI on 01/15/2022 SB angioplasty to RCA, chronic diastolic heart failure, vision loss, HOH   Clinical Impression  Pt in bed upon arrival and agreeable to PT eval. Prior to admission, pt was ModI with a SP cane outside the home. In today's session, pt requires MaxAx2-TotalAx2 for all mobility. Pt is fearful of falling and guards L LE with movement secondary to pain. Pt had difficulty maintaining LLE TDWB precautions during stand pivot requiring physical assistance to hold L LE. Transferred pt to [facility] and then to recliner w/ lift pad underneath pt. Pt presents to therapy session with decreased strength, ROM, balance, activity tolerance and mobility. Pt would benefit from acute skilled PT to address functional impairments. Recommending post-acute rehab <3hrs to return to previous level of independence. Acute PT to follow.          If plan is discharge home, recommend the following: A lot of help with walking and/or transfers;A lot of help with bathing/dressing/bathroom;Assistance with cooking/housework;Assist for transportation;Help with stairs or ramp for entrance   Can travel by private vehicle   No    Equipment Recommendations None recommended by PT (TBD)  Recommendations for Other Services       Functional Status Assessment Patient has had a recent decline in their functional status and demonstrates the ability to make significant improvements in function in a reasonable and predictable amount of time.     Precautions / Restrictions Precautions Precautions: Fall Restrictions Weight Bearing Restrictions: Yes LLE Weight Bearing:  Touchdown weight bearing      Mobility  Bed Mobility Overal bed mobility: Needs Assistance Bed Mobility: Supine to Sit     Supine to sit: +2 for physical assistance, Total assist, HOB elevated     General bed mobility comments: pt with helicopter method w/ B LE support towards EOB. High guarding and anticipation w/ movement. Guided pt with PLB, however, pt held breath during movement. Initially fearful of falling with close guard for comfort, progressed w/ rapport    Transfers Overall transfer level: Needs assistance Equipment used: 2 person hand held assist Transfers: Sit to/from Stand, Bed to chair/wheelchair/BSC Sit to Stand: +2 physical assistance, Max assist, From elevated surface Stand pivot transfers: +2 physical assistance, Max assist, +2 safety/equipment, From elevated surface         General transfer comment: Pt has difficult keeping L LE extended while standing requiring guarding and physical assistance to prevent WB. Pt does not maintain TDWB    Ambulation/Gait    General Gait Details: Deferred, pt unable to maintain balance w/o MaxAx2, does not adhere to WB precautions         Balance Overall balance assessment: Mild deficits observed, not formally tested       Pertinent Vitals/Pain Pain Assessment Pain Assessment: Faces Faces Pain Scale: Hurts even more Pain Location: LLE Pain Descriptors / Indicators: Discomfort, Grimacing, Headache Pain Intervention(s): Limited activity within patient's tolerance, Monitored during session, Repositioned, Premedicated before session, Ice applied    Home Living Family/patient expects to be discharged to:: Private residence Living Arrangements: Alone Available Help at Discharge: Family;Available PRN/intermittently Type of Home: House Home Access: Stairs to enter Entrance Stairs-Rails: None Entrance Stairs-Number of Steps:  2   Home Layout: One level Home Equipment: Cane - single point;Grab bars - tub/shower       Prior Function Prior Level of Function : Needs assist;History of Falls (last six months)      Mobility Comments: Uses cane outside, no AD in the house. ADLs Comments: Has a driver x1 a week for appointments, impaired vision L eye macular degenration, R eye catarats     Extremity/Trunk Assessment   Upper Extremity Assessment Upper Extremity Assessment: Defer to OT evaluation RUE Deficits / Details: reports difficulty lifting arm since fall. pt with pain up until 45 degrees. pt (A)ing with L UE and then once at 80 degrees able to complete shoulder flexoin without (A) RUE Coordination: WNL    Lower Extremity Assessment Lower Extremity Assessment: Generalized weakness LLE Deficits / Details: Limited ROM due to pain, guarding in anticipation to pain. Holds in extension in supine    Cervical / Trunk Assessment Cervical / Trunk Assessment: Kyphotic  Communication   Communication Communication: Hearing impairment Cueing Techniques: Verbal cues  Cognition Arousal: Alert Behavior During Therapy: WFL for tasks assessed/performed Overall Cognitive Status: Within Functional Limits for tasks assessed  General Comments: Very HOH. Increased time to respond to questions. High anxiety about falling.        General Comments General comments (skin integrity, edema, etc.): bandages in tact over incision w/ no drainage           PT Assessment Patient needs continued PT services  PT Problem List Decreased strength;Decreased range of motion;Decreased activity tolerance;Decreased balance;Decreased mobility;Decreased safety awareness;Decreased knowledge of precautions       PT Treatment Interventions DME instruction;Gait training;Functional mobility training;Therapeutic activities;Therapeutic exercise;Balance training;Patient/family education    PT Goals (Current goals can be found in the Care Plan section)  Acute Rehab PT Goals Patient Stated Goal: to get better PT Goal Formulation: With  patient Time For Goal Achievement: 04/28/23 Potential to Achieve Goals: Good    Frequency Min 1X/week     Co-evaluation PT/OT/SLP Co-Evaluation/Treatment: Yes Reason for Co-Treatment: For patient/therapist safety;To address functional/ADL transfers;Complexity of the patient's impairments (multi-system involvement) PT goals addressed during session: Mobility/safety with mobility;Balance OT goals addressed during session: ADL's and self-care;Proper use of Adaptive equipment and DME;Strengthening/ROM       AM-PAC PT "6 Clicks" Mobility  Outcome Measure Help needed turning from your back to your side while in a flat bed without using bedrails?: A Lot Help needed moving from lying on your back to sitting on the side of a flat bed without using bedrails?: Total Help needed moving to and from a bed to a chair (including a wheelchair)?: Total Help needed standing up from a chair using your arms (e.g., wheelchair or bedside chair)?: Total Help needed to walk in hospital room?: Total Help needed climbing 3-5 steps with a railing? : Total 6 Click Score: 7    End of Session Equipment Utilized During Treatment: Gait belt Activity Tolerance: Patient limited by pain Patient left: in chair;with call bell/phone within reach;with chair alarm set (chair lift pad under pt) Nurse Communication: Mobility status PT Visit Diagnosis: Unsteadiness on feet (R26.81);History of falling (Z91.81);Muscle weakness (generalized) (M62.81)    Time: 1610-9604 PT Time Calculation (min) (ACUTE ONLY): 30 min   Charges:   PT Evaluation $PT Eval Low Complexity: 1 Low   PT General Charges $$ ACUTE PT VISIT: 1 Visit         Hilton Cork, PT, DPT Secure Chat Preferred  Rehab Office [phone]  Arturo Morton Brion Aliment 04/14/2023, 10:15 AM

## 2023-04-14 NOTE — Plan of Care (Signed)
  Problem: Education: Goal: Knowledge of General Education information will improve Description: Including pain rating scale, medication(s)/side effects and non-pharmacologic comfort measures Outcome: Progressing   Problem: Health Behavior/Discharge Planning: Goal: Ability to manage health-related needs will improve Outcome: Progressing   Problem: Coping: Goal: Level of anxiety will decrease Outcome: Progressing   Problem: Pain Management: Goal: Pain level will decrease Outcome: Progressing

## 2023-04-14 NOTE — Discharge Instructions (Signed)
Dr. Samson Frederic Adult Hip & Knee Specialist [facility] [address] [place], [place] [address] [phone]   POSTOPERATIVE DIRECTIONS    Hip Rehabilitation, Guidelines Following Surgery   WEIGHT BEARING Other:  Touchdown weight bearing left lower extremity.    HOME CARE INSTRUCTIONS  Remove items at home which could result in a fall. This includes throw rugs or furniture in walking pathways.  Continue medications as instructed at time of discharge. You may have some home medications which will be placed on hold until you complete the course of blood thinner medication. 4 days after discharge, you may start showering. No tub baths or soaking your incisions. Do not put on socks or shoes without following the instructions of your caregivers.   Sit on chairs with arms. Use the chair arms to help push yourself up when arising.  Arrange for the use of a toilet seat elevator so you are not sitting low.  Walk with walker as instructed.  You may resume a sexual relationship in one month or when given the OK by your caregiver.  Use walker as long as suggested by your caregivers.  Avoid periods of inactivity such as sitting longer than an hour when not asleep. This helps prevent blood clots.  You may return to work once you are cleared by Designer, industrial/product.  Do not drive a car for 6 weeks or until released by your surgeon.  Do not drive while taking narcotics.  Wear elastic stockings for two weeks following surgery during the day but you may remove then at night.  Make sure you keep all of your appointments after your operation with all of your doctors and caregivers. You should call the office at the above phone number and make an appointment for approximately two weeks after the date of your surgery. Please pick up a stool softener and laxative for home use as long as you are requiring pain medications. ICE to the affected hip every three hours for 30 minutes  at a time and then as needed for pain and swelling. Continue to use ice on the hip for pain and swelling from surgery. You may notice swelling that will progress down to the foot and ankle.  This is normal after surgery.  Elevate the leg when you are not up walking on it.   It is important for you to complete the blood thinner medication as prescribed by your doctor. Continue to use the breathing machine which will help keep your temperature down.  It is common for your temperature to cycle up and down following surgery, especially at night when you are not up moving around and exerting yourself.  The breathing machine keeps your lungs expanded and your temperature down.  RANGE OF MOTION AND STRENGTHENING EXERCISES  These exercises are designed to help you keep full movement of your hip joint. Follow your caregiver's or physical therapist's instructions. Perform all exercises about fifteen times, three times per day or as directed. Exercise both hips, even if you have had only one joint replacement. These exercises can be done on a training (exercise) mat, on the floor, on a table or on a bed. Use whatever works the best and is most comfortable for you. Use music or television while you are exercising so that the exercises are a pleasant break in your day. This will make your life better with the exercises acting as a break in routine you can look forward to.  Lying on your back,  slowly slide your foot toward your buttocks, raising your knee up off the floor. Then slowly slide your foot back down until your leg is straight again.  Lying on your back spread your legs as far apart as you can without causing discomfort.  Lying on your side, raise your upper leg and foot straight up from the floor as far as is comfortable. Slowly lower the leg and repeat.  Lying on your back, tighten up the muscle in the front of your thigh (quadriceps muscles). You can do this by keeping your leg straight and trying to raise  your heel off the floor. This helps strengthen the largest muscle supporting your knee.  Lying on your back, tighten up the muscles of your buttocks both with the legs straight and with the knee bent at a comfortable angle while keeping your heel on the floor.   SKILLED REHAB INSTRUCTIONS: If the patient is transferred to a skilled rehab facility following release from the hospital, a list of the current medications will be sent to the facility for the patient to continue.  When discharged from the skilled rehab facility, please have the facility set up the patient's Home Health Physical Therapy prior to being released. Also, the skilled facility will be responsible for providing the patient with their medications at time of release from the facility to include their pain medication and their blood thinner medication. If the patient is still at the rehab facility at time of the two week follow up appointment, the skilled rehab facility will also need to assist the patient in arranging follow up appointment in our office and any transportation needs.  MAKE SURE YOU:  Understand these instructions.  Will watch your condition.  Will get help right away if you are not doing well or get worse.  Pick up stool softner and laxative for home use following surgery while on pain medications. Do not change your dressing.  No tub baths or soaking the incisions. Continue to use ice for pain and swelling after surgery. Do not use any lotions or creams on the incision until instructed by your surgeon.

## 2023-04-15 DIAGNOSIS — S72002A Fracture of unspecified part of neck of left femur, initial encounter for closed fracture: Secondary | ICD-10-CM | POA: Diagnosis not present

## 2023-04-15 LAB — CBC WITH DIFFERENTIAL/PLATELET
Abs Immature Granulocytes: 0.05 10*3/uL (ref 0.00–0.07)
Basophils Absolute: 0 10*3/uL (ref 0.0–0.1)
Basophils Relative: 0 %
Eosinophils Absolute: 0.1 10*3/uL (ref 0.0–0.5)
Eosinophils Relative: 1 %
HCT: 21.5 % — ABNORMAL LOW (ref 36.0–46.0)
Hemoglobin: 6.9 g/dL — CL (ref 12.0–15.0)
Immature Granulocytes: 1 %
Lymphocytes Relative: 10 %
Lymphs Abs: 0.8 10*3/uL (ref 0.7–4.0)
MCH: 30.1 pg (ref 26.0–34.0)
MCHC: 32.1 g/dL (ref 30.0–36.0)
MCV: 93.9 fL (ref 80.0–100.0)
Monocytes Absolute: 1 10*3/uL (ref 0.1–1.0)
Monocytes Relative: 13 %
Neutro Abs: 5.7 10*3/uL (ref 1.7–7.7)
Neutrophils Relative %: 75 %
Platelets: 138 10*3/uL — ABNORMAL LOW (ref 150–400)
RBC: 2.29 MIL/uL — ABNORMAL LOW (ref 3.87–5.11)
RDW: 12.5 % (ref 11.5–15.5)
WBC: 7.6 10*3/uL (ref 4.0–10.5)
nRBC: 0 % (ref 0.0–0.2)

## 2023-04-15 LAB — BASIC METABOLIC PANEL
Anion gap: 8 (ref 5–15)
BUN: 46 mg/dL — ABNORMAL HIGH (ref 8–23)
CO2: 22 mmol/L (ref 22–32)
Calcium: 8.3 mg/dL — ABNORMAL LOW (ref 8.9–10.3)
Chloride: 104 mmol/L (ref 98–111)
Creatinine, Ser: 1.82 mg/dL — ABNORMAL HIGH (ref 0.44–1.00)
GFR, Estimated: 25 mL/min — ABNORMAL LOW (ref >60)
Glucose, Bld: 119 mg/dL — ABNORMAL HIGH (ref 70–99)
Potassium: 4.8 mmol/L (ref 3.5–5.1)
Sodium: 134 mmol/L — ABNORMAL LOW (ref 135–145)

## 2023-04-15 LAB — GLUCOSE, CAPILLARY
Glucose-Capillary: 102 mg/dL — ABNORMAL HIGH (ref 70–99)
Glucose-Capillary: 106 mg/dL — ABNORMAL HIGH (ref 70–99)
Glucose-Capillary: 110 mg/dL — ABNORMAL HIGH (ref 70–99)
Glucose-Capillary: 91 mg/dL (ref 70–99)
Glucose-Capillary: 98 mg/dL (ref 70–99)
Glucose-Capillary: 99 mg/dL (ref 70–99)

## 2023-04-15 LAB — MAGNESIUM: Magnesium: 2.2 mg/dL (ref 1.7–2.4)

## 2023-04-15 NOTE — Progress Notes (Signed)
    Subjective:  Patient reports pain as mild to moderate.  Denies N/V/CP/SOB/. Reports had difficulty with PT, will try again today.   Objective:   VITALS:   Vitals:   04/14/23 1529 04/14/23 2003 04/15/23 0410 04/15/23 0743  BP: (!) 150/57 (!) 150/59 (!) 150/60 (!) 136/45  Pulse: 76 80 73 68  Resp: 18 17 16 18   Temp: 98.3 F (36.8 C) 98.2 F (36.8 C) 98.1 F (36.7 C) 98.9 F (37.2 C)  TempSrc: Oral Oral Oral Oral  SpO2: 99% 100% 95% 98%  Weight:      Height:        Patient lying in bed. NAD.   LLE:  ABD soft Neurovascular intact Sensation intact distally Intact pulses distally Dorsiflexion/Plantar flexion intact Incision: dressing C/D/I No cellulitis present Compartment soft   Lab Results  Component Value Date   WBC 8.0 04/14/2023   HGB 7.0 (L) 04/14/2023   HCT 21.9 (L) 04/14/2023   MCV 93.2 04/14/2023   PLT 150 04/14/2023   BMET    Component Value Date/Time   NA 134 (L) 04/14/2023 0321   NA 137 04/25/2022 1232   K 5.3 (H) 04/14/2023 0321   CL 107 04/14/2023 0321   CO2 22 04/14/2023 0321   GLUCOSE 123 (H) 04/14/2023 0321   GLUCOSE 126 (H) 04/23/2006 1043   BUN 45 (H) 04/14/2023 0321   BUN 36 04/25/2022 1232   CREATININE 1.70 (H) 04/14/2023 0321   CREATININE 1.79 (H) 01/05/2023 1131   CALCIUM 8.5 (L) 04/14/2023 0321   EGFR 26 (L) 01/05/2023 1131   EGFR 38 (L) 04/25/2022 1232   GFRNONAA 27 (L) 04/14/2023 0321     Assessment/Plan: 2 Days Post-Op   Principal Problem:   Closed left hip fracture (HCC)   TDWB LLE with walker DVT ppx: Lovenox and   , SCDs, TEDS PO pain control PT/OT: continue PT until d/c Dispo: Patient under care of the medical team, disposition per their recommendation. Pain medication and DVT ppx printed in chart.    Arbie Cookey, PA-C 04/15/2023, 7:44 AM   [facility]  [place] [address], [place], [place] [address] Phone: [phone] [email or web address] Facebook  [email or web address]

## 2023-04-15 NOTE — Progress Notes (Signed)
PROGRESS NOTE    ARITZEL GEERDES  UEA:[id number] DOB: 1925-08-13 DOA: 04/12/2023 PCP: Caesar Bookman, NP   Brief Narrative:  89 y.o. female with medical history significant for type 2 diabetes, essential hypertension, hypothyroidism, HFpEF presented with a fall and was found to have proximal left femur fracture.  She underwent surgical intervention on 04/13/2023 by orthopedics.  Assessment & Plan:   Acute proximal left femur fracture status post mechanical fall -underwent surgical intervention on 04/13/2023 by orthopedics.  Wound care/DVT prophylaxis/pain management/activity as per orthopedics recommendations -Fall precautions -PT/OT recommended SNF placement.  TOC consulted. -Pain management  Acute blood loss anemia on top of anemia of chronic disease -Patient probably has anemia of chronic disease from chronic illnesses.  Hemoglobin dropped down to 7 on 04/14/2023.  Patient refused packed red cell transfusion and wanted her hemoglobin repeated for today.  Hemoglobin pending for today.   Monitor H&H.  Hyperkalemia -Questionable cause.  Labs pending.  Hyponatremia Mild.  Labs pending today.  CKD stage IIIb -Creatinine stable.  Monitor.  Essential hypertension -Might have to resume antihypertensives.  Monitor blood pressure  Hyperlipidemia Continue ezetimibe  Hypothyroidism Continue levothyroxine  Diabetes mellitus type 2 -Metformin on hold.  Continue CBGs with SSI.  Last A1c was 6.1. -Carb modified diet  Goals of care -Palliative care following: CODE STATUS changed to DNR   DVT prophylaxis: Lovenox Code Status: DNR Family Communication: None at bedside Disposition Plan: Status is: Inpatient Remains inpatient appropriate because: Of severity of illness  Consultants: Orthopedics  Procedures: As above  Antimicrobials: Perioperative   Subjective: Patient seen and examined at bedside.  Hard of hearing.  Still having intermittent left hip pain.  No  vomiting, fever or chest pain reported. Objective: Vitals:   04/14/23 1529 04/14/23 2003 04/15/23 0410 04/15/23 0743  BP: (!) 150/57 (!) 150/59 (!) 150/60 (!) 136/45  Pulse: 76 80 73 68  Resp: 18 17 16 18   Temp: 98.3 F (36.8 C) 98.2 F (36.8 C) 98.1 F (36.7 C) 98.9 F (37.2 C)  TempSrc: Oral Oral Oral Oral  SpO2: 99% 100% 95% 98%  Weight:      Height:        Intake/Output Summary (Last 24 hours) at 04/15/2023 0802 Last data filed at 04/14/2023 1800 Gross per 24 hour  Intake 240 ml  Output 2 ml  Net 238 ml   Filed Weights   04/12/23 1841  Weight: 52.2 kg    Examination:  General: Currently on room air.  No distress.  Elevated.  Elderly female lying in bed. ENT/neck: No thyromegaly.  JVD is not elevated  respiratory: Decreased breath sounds at bases bilaterally with some crackles; no wheezing  CVS: S1-S2 heard, rate controlled currently Abdominal: Soft, nontender, slightly distended; no organomegaly,  bowel sounds are heard Extremities: Trace lower extremity edema; no cyanosis  CNS: Awake and alert.  Slow to respond.  No focal neurologic deficit.  Moves extremities Lymph: No obvious lymphadenopathy Skin: No obvious ecchymosis/lesions  psych: Flat affect.  Not agitated.     Data Reviewed: I have personally reviewed following labs and imaging studies  CBC: Recent Labs  Lab 04/12/23 1906 04/13/23 0603 04/13/23 1252 04/14/23 0321  WBC 9.7 10.8* 10.4 8.0  NEUTROABS 7.0  --   --  6.0  HGB 9.9* 8.7* 8.3* 7.0*  HCT 30.1* 26.7* 25.9* 21.9*  MCV 92.6 92.7 94.5 93.2  PLT 210 167 172 150   Basic Metabolic Panel: Recent Labs  Lab 04/12/23 1906 04/13/23 0603 04/13/23  1252 04/14/23 0321  NA 134* 133*  --  134*  K 5.0 5.7*  --  5.3*  CL 100 103  --  107  CO2 21* 20*  --  22  GLUCOSE 162* 130*  --  123*  BUN 50* 52*  --  45*  CREATININE 1.68* 1.75* 1.88* 1.70*  CALCIUM 9.2 8.8*  --  8.5*  MG  --  2.3  --   --   PHOS  --  4.9*  --   --    GFR: Estimated  Creatinine Clearance: 14.6 mL/min (A) (by C-G formula based on SCr of 1.7 mg/dL (H)). Liver Function Tests: Recent Labs  Lab 04/12/23 1906  AST 12*  ALT 7  ALKPHOS 25*  BILITOT 0.4  PROT 6.7  ALBUMIN 3.9   No results for input(s): "LIPASE", "AMYLASE" in the last 168 hours. No results for input(s): "AMMONIA" in the last 168 hours. Coagulation Profile: No results for input(s): "INR", "PROTIME" in the last 168 hours. Cardiac Enzymes: No results for input(s): "CKTOTAL", "CKMB", "CKMBINDEX", "TROPONINI" in the last 168 hours. BNP (last 3 results) Recent Labs    04/25/22 1232  PROBNP 4,724*   HbA1C: No results for input(s): "HGBA1C" in the last 72 hours. CBG: Recent Labs  Lab 04/14/23 1646 04/14/23 1956 04/15/23 0000 04/15/23 0407 04/15/23 0747  GLUCAP 144* 186* 98 102* 110*   Lipid Profile: No results for input(s): "CHOL", "HDL", "LDLCALC", "TRIG", "CHOLHDL", "LDLDIRECT" in the last 72 hours. Thyroid Function Tests: No results for input(s): "TSH", "T4TOTAL", "FREET4", "T3FREE", "THYROIDAB" in the last 72 hours. Anemia Panel: No results for input(s): "VITAMINB12", "FOLATE", "FERRITIN", "TIBC", "IRON", "RETICCTPCT" in the last 72 hours. Sepsis Labs: No results for input(s): "PROCALCITON", "LATICACIDVEN" in the last 168 hours.  Recent Results (from the past 240 hour(s))  Surgical pcr screen     Status: None   Collection Time: 04/12/23 11:46 PM   Specimen: Nasal Mucosa; Nasal Swab  Result Value Ref Range Status   MRSA, PCR NEGATIVE NEGATIVE Final   Staphylococcus aureus NEGATIVE NEGATIVE Final    Comment: (NOTE) The Xpert SA Assay (FDA approved for NASAL specimens in patients 33 years of age and older), is one component of a comprehensive surveillance program. It is not intended to diagnose infection nor to guide or monitor treatment. Performed at [facility], [address] [place], [place] [address]          Radiology Studies: DG Pelvis  Portable  Result Date: 04/13/2023 CLINICAL DATA:  Close left subtrochanteric femur fracture. Postoperative radiographs. EXAM: PORTABLE PELVIS 1-2 VIEWS; LEFT FEMUR PORTABLE 2 VIEWS COMPARISON:  Left femur intraoperative fluoroscopy 04/13/2023, pelvis and left femur radiographs 04/12/2023 FINDINGS: There is diffuse decreased bone mineralization. Marrow anterolateral femoroacetabular joint space narrowing. Moderate severe pubic symphysis joint space narrowing, subchondral sclerosis, peripheral osteophytosis. Mild-to-moderate right and mild left sacroiliac joint space narrowing with mild subchondral sclerosis. New long left intramedullary nail fixation of the previously seen comminuted fracture of the proximal femoral diaphysis predominantly from a superolateral to inferior medial orientation. Improved alignment with resolution of the prior mild varus angulation of the fracture. There is again a comminuted butterfly fracture fragment at the proximal lateral aspect of the fracture. No evidence of hardware failure. Moderate to severe lateral knee compartment joint space narrowing, subchondral sclerosis, peripheral osteophytosis. Moderate to severe patellofemoral joint space narrowing with moderate peripheral osteophytes. Moderate to high-grade atherosclerotic calcifications. Expected postoperative thigh subcutaneous air. Lateral proximal distal left thigh surgical staples. IMPRESSION:  New long left intramedullary nail fixation of the previously seen comminuted fracture of the proximal femoral diaphysis. Improved alignment with resolution of the prior mild varus angulation of the fracture. No evidence of hardware failure. Electronically Signed   By: Neita Garnet M.D.   On: 04/13/2023 12:14   DG FEMUR PORT MIN 2 VIEWS LEFT  Result Date: 04/13/2023 CLINICAL DATA:  Close left subtrochanteric femur fracture. Postoperative radiographs. EXAM: PORTABLE PELVIS 1-2 VIEWS; LEFT FEMUR PORTABLE 2 VIEWS COMPARISON:  Left  femur intraoperative fluoroscopy 04/13/2023, pelvis and left femur radiographs 04/12/2023 FINDINGS: There is diffuse decreased bone mineralization. Marrow anterolateral femoroacetabular joint space narrowing. Moderate severe pubic symphysis joint space narrowing, subchondral sclerosis, peripheral osteophytosis. Mild-to-moderate right and mild left sacroiliac joint space narrowing with mild subchondral sclerosis. New long left intramedullary nail fixation of the previously seen comminuted fracture of the proximal femoral diaphysis predominantly from a superolateral to inferior medial orientation. Improved alignment with resolution of the prior mild varus angulation of the fracture. There is again a comminuted butterfly fracture fragment at the proximal lateral aspect of the fracture. No evidence of hardware failure. Moderate to severe lateral knee compartment joint space narrowing, subchondral sclerosis, peripheral osteophytosis. Moderate to severe patellofemoral joint space narrowing with moderate peripheral osteophytes. Moderate to high-grade atherosclerotic calcifications. Expected postoperative thigh subcutaneous air. Lateral proximal distal left thigh surgical staples. IMPRESSION: New long left intramedullary nail fixation of the previously seen comminuted fracture of the proximal femoral diaphysis. Improved alignment with resolution of the prior mild varus angulation of the fracture. No evidence of hardware failure. Electronically Signed   By: Neita Garnet M.D.   On: 04/13/2023 12:14   DG FEMUR MIN 2 VIEWS LEFT  Result Date: 04/13/2023 CLINICAL DATA:  Elective surgery. EXAM: LEFT FEMUR 2 VIEWS COMPARISON:  Preoperative imaging FINDINGS: Eight fluoroscopic spot views of the left femur obtained in the operating room. Skull intra limit jet is during femoral intramedullary nail with trans trochanteric and distal locking screw fixation traversing proximal femur fracture. Fluoroscopy time 1 minutes 40 seconds.  Dose 11.5 mGy IMPRESSION: Intraoperative fluoroscopy during proximal femur fracture fixation. Electronically Signed   By: Narda Rutherford M.D.   On: 04/13/2023 11:02   DG C-Arm 1-60 Min-No Report  Result Date: 04/13/2023 Fluoroscopy was utilized by the requesting physician.  No radiographic interpretation.   DG C-Arm 1-60 Min-No Report  Result Date: 04/13/2023 Fluoroscopy was utilized by the requesting physician.  No radiographic interpretation.        Scheduled Meds:  sodium chloride   Intravenous Once   docusate sodium  100 mg Oral BID   enoxaparin (LOVENOX) injection  30 mg Subcutaneous Q24H   ezetimibe  10 mg Oral Daily   insulin aspart  0-9 Units Subcutaneous Q4H   levothyroxine  50 mcg Oral QAC breakfast   mometasone-formoterol  2 puff Inhalation BID   senna  1 tablet Oral BID   Continuous Infusions:        Glade Lloyd, MD [facility] 04/15/2023, 8:02 AM

## 2023-04-15 NOTE — Progress Notes (Signed)
Pt has critical hgb 6.9, MD made aware. Was told that pt refused transfusion yesterday. When this nurse spoke to the pt regarding the transfusion, pt stated she would like to think about it and will let me know sometime today.

## 2023-04-15 NOTE — Plan of Care (Signed)
  Problem: Activity: Goal: Risk for activity intolerance will decrease Outcome: Progressing   Problem: Nutrition: Goal: Adequate nutrition will be maintained Outcome: Progressing   Problem: Coping: Goal: Level of anxiety will decrease Outcome: Progressing   Problem: Pain Management: Goal: General experience of comfort will improve Outcome: Progressing

## 2023-04-16 ENCOUNTER — Encounter (HOSPITAL_COMMUNITY): Payer: Self-pay | Admitting: Orthopedic Surgery

## 2023-04-16 DIAGNOSIS — R739 Hyperglycemia, unspecified: Secondary | ICD-10-CM | POA: Diagnosis not present

## 2023-04-16 DIAGNOSIS — R2681 Unsteadiness on feet: Secondary | ICD-10-CM | POA: Diagnosis not present

## 2023-04-16 DIAGNOSIS — E7800 Pure hypercholesterolemia, unspecified: Secondary | ICD-10-CM | POA: Diagnosis not present

## 2023-04-16 DIAGNOSIS — E875 Hyperkalemia: Secondary | ICD-10-CM | POA: Diagnosis not present

## 2023-04-16 DIAGNOSIS — E039 Hypothyroidism, unspecified: Secondary | ICD-10-CM | POA: Diagnosis not present

## 2023-04-16 DIAGNOSIS — R1311 Dysphagia, oral phase: Secondary | ICD-10-CM | POA: Diagnosis not present

## 2023-04-16 DIAGNOSIS — W19XXXA Unspecified fall, initial encounter: Secondary | ICD-10-CM | POA: Diagnosis not present

## 2023-04-16 DIAGNOSIS — Z515 Encounter for palliative care: Secondary | ICD-10-CM | POA: Diagnosis not present

## 2023-04-16 DIAGNOSIS — Z9181 History of falling: Secondary | ICD-10-CM | POA: Diagnosis not present

## 2023-04-16 DIAGNOSIS — Z955 Presence of coronary angioplasty implant and graft: Secondary | ICD-10-CM | POA: Diagnosis not present

## 2023-04-16 DIAGNOSIS — K921 Melena: Secondary | ICD-10-CM | POA: Diagnosis not present

## 2023-04-16 DIAGNOSIS — N1832 Chronic kidney disease, stage 3b: Secondary | ICD-10-CM | POA: Diagnosis not present

## 2023-04-16 DIAGNOSIS — R413 Other amnesia: Secondary | ICD-10-CM | POA: Diagnosis not present

## 2023-04-16 DIAGNOSIS — Z794 Long term (current) use of insulin: Secondary | ICD-10-CM | POA: Diagnosis not present

## 2023-04-16 DIAGNOSIS — I4430 Unspecified atrioventricular block: Secondary | ICD-10-CM | POA: Diagnosis not present

## 2023-04-16 DIAGNOSIS — I5030 Unspecified diastolic (congestive) heart failure: Secondary | ICD-10-CM | POA: Diagnosis not present

## 2023-04-16 DIAGNOSIS — R079 Chest pain, unspecified: Secondary | ICD-10-CM | POA: Diagnosis not present

## 2023-04-16 DIAGNOSIS — I5032 Chronic diastolic (congestive) heart failure: Secondary | ICD-10-CM | POA: Diagnosis not present

## 2023-04-16 DIAGNOSIS — I959 Hypotension, unspecified: Secondary | ICD-10-CM | POA: Diagnosis not present

## 2023-04-16 DIAGNOSIS — Z7401 Bed confinement status: Secondary | ICD-10-CM | POA: Diagnosis not present

## 2023-04-16 DIAGNOSIS — Z90710 Acquired absence of both cervix and uterus: Secondary | ICD-10-CM | POA: Diagnosis not present

## 2023-04-16 DIAGNOSIS — D631 Anemia in chronic kidney disease: Secondary | ICD-10-CM | POA: Diagnosis not present

## 2023-04-16 DIAGNOSIS — E876 Hypokalemia: Secondary | ICD-10-CM | POA: Diagnosis not present

## 2023-04-16 DIAGNOSIS — I10 Essential (primary) hypertension: Secondary | ICD-10-CM | POA: Diagnosis not present

## 2023-04-16 DIAGNOSIS — K222 Esophageal obstruction: Secondary | ICD-10-CM | POA: Diagnosis not present

## 2023-04-16 DIAGNOSIS — Z79890 Hormone replacement therapy (postmenopausal): Secondary | ICD-10-CM | POA: Diagnosis not present

## 2023-04-16 DIAGNOSIS — R9431 Abnormal electrocardiogram [ECG] [EKG]: Secondary | ICD-10-CM | POA: Diagnosis not present

## 2023-04-16 DIAGNOSIS — Z66 Do not resuscitate: Secondary | ICD-10-CM | POA: Diagnosis not present

## 2023-04-16 DIAGNOSIS — E871 Hypo-osmolality and hyponatremia: Secondary | ICD-10-CM | POA: Diagnosis not present

## 2023-04-16 DIAGNOSIS — D329 Benign neoplasm of meninges, unspecified: Secondary | ICD-10-CM | POA: Diagnosis not present

## 2023-04-16 DIAGNOSIS — I130 Hypertensive heart and chronic kidney disease with heart failure and stage 1 through stage 4 chronic kidney disease, or unspecified chronic kidney disease: Secondary | ICD-10-CM | POA: Diagnosis not present

## 2023-04-16 DIAGNOSIS — R109 Unspecified abdominal pain: Secondary | ICD-10-CM | POA: Diagnosis not present

## 2023-04-16 DIAGNOSIS — R2689 Other abnormalities of gait and mobility: Secondary | ICD-10-CM | POA: Diagnosis not present

## 2023-04-16 DIAGNOSIS — S72002D Fracture of unspecified part of neck of left femur, subsequent encounter for closed fracture with routine healing: Secondary | ICD-10-CM | POA: Diagnosis not present

## 2023-04-16 DIAGNOSIS — I129 Hypertensive chronic kidney disease with stage 1 through stage 4 chronic kidney disease, or unspecified chronic kidney disease: Secondary | ICD-10-CM | POA: Diagnosis not present

## 2023-04-16 DIAGNOSIS — E1122 Type 2 diabetes mellitus with diabetic chronic kidney disease: Secondary | ICD-10-CM | POA: Diagnosis not present

## 2023-04-16 DIAGNOSIS — G9341 Metabolic encephalopathy: Secondary | ICD-10-CM | POA: Diagnosis not present

## 2023-04-16 DIAGNOSIS — D649 Anemia, unspecified: Secondary | ICD-10-CM | POA: Diagnosis not present

## 2023-04-16 DIAGNOSIS — E785 Hyperlipidemia, unspecified: Secondary | ICD-10-CM | POA: Diagnosis not present

## 2023-04-16 DIAGNOSIS — D72829 Elevated white blood cell count, unspecified: Secondary | ICD-10-CM | POA: Diagnosis not present

## 2023-04-16 DIAGNOSIS — G9389 Other specified disorders of brain: Secondary | ICD-10-CM | POA: Diagnosis not present

## 2023-04-16 DIAGNOSIS — N3289 Other specified disorders of bladder: Secondary | ICD-10-CM | POA: Diagnosis not present

## 2023-04-16 DIAGNOSIS — M6281 Muscle weakness (generalized): Secondary | ICD-10-CM | POA: Diagnosis not present

## 2023-04-16 DIAGNOSIS — D62 Acute posthemorrhagic anemia: Secondary | ICD-10-CM | POA: Diagnosis not present

## 2023-04-16 DIAGNOSIS — N179 Acute kidney failure, unspecified: Secondary | ICD-10-CM | POA: Diagnosis not present

## 2023-04-16 DIAGNOSIS — K5900 Constipation, unspecified: Secondary | ICD-10-CM | POA: Diagnosis not present

## 2023-04-16 DIAGNOSIS — S7222XD Displaced subtrochanteric fracture of left femur, subsequent encounter for closed fracture with routine healing: Secondary | ICD-10-CM | POA: Diagnosis not present

## 2023-04-16 DIAGNOSIS — S72002A Fracture of unspecified part of neck of left femur, initial encounter for closed fracture: Secondary | ICD-10-CM | POA: Diagnosis not present

## 2023-04-16 DIAGNOSIS — E860 Dehydration: Secondary | ICD-10-CM | POA: Diagnosis not present

## 2023-04-16 DIAGNOSIS — E119 Type 2 diabetes mellitus without complications: Secondary | ICD-10-CM | POA: Diagnosis not present

## 2023-04-16 LAB — BASIC METABOLIC PANEL
Anion gap: 8 (ref 5–15)
BUN: 42 mg/dL — ABNORMAL HIGH (ref 8–23)
CO2: 20 mmol/L — ABNORMAL LOW (ref 22–32)
Calcium: 8.4 mg/dL — ABNORMAL LOW (ref 8.9–10.3)
Chloride: 106 mmol/L (ref 98–111)
Creatinine, Ser: 1.32 mg/dL — ABNORMAL HIGH (ref 0.44–1.00)
GFR, Estimated: 37 mL/min — ABNORMAL LOW (ref >60)
Glucose, Bld: 95 mg/dL (ref 70–99)
Potassium: 4.7 mmol/L (ref 3.5–5.1)
Sodium: 134 mmol/L — ABNORMAL LOW (ref 135–145)

## 2023-04-16 LAB — CBC WITH DIFFERENTIAL/PLATELET
Abs Immature Granulocytes: 0.03 10*3/uL (ref 0.00–0.07)
Basophils Absolute: 0 10*3/uL (ref 0.0–0.1)
Basophils Relative: 0 %
Eosinophils Absolute: 0.1 10*3/uL (ref 0.0–0.5)
Eosinophils Relative: 1 %
HCT: 26.8 % — ABNORMAL LOW (ref 36.0–46.0)
Hemoglobin: 8.9 g/dL — ABNORMAL LOW (ref 12.0–15.0)
Immature Granulocytes: 0 %
Lymphocytes Relative: 12 %
Lymphs Abs: 1 10*3/uL (ref 0.7–4.0)
MCH: 30.9 pg (ref 26.0–34.0)
MCHC: 33.2 g/dL (ref 30.0–36.0)
MCV: 93.1 fL (ref 80.0–100.0)
Monocytes Absolute: 0.9 10*3/uL (ref 0.1–1.0)
Monocytes Relative: 11 %
Neutro Abs: 6.2 10*3/uL (ref 1.7–7.7)
Neutrophils Relative %: 76 %
Platelets: 153 10*3/uL (ref 150–400)
RBC: 2.88 MIL/uL — ABNORMAL LOW (ref 3.87–5.11)
RDW: 12.8 % (ref 11.5–15.5)
WBC: 8.2 10*3/uL (ref 4.0–10.5)
nRBC: 0 % (ref 0.0–0.2)

## 2023-04-16 LAB — GLUCOSE, CAPILLARY
Glucose-Capillary: 103 mg/dL — ABNORMAL HIGH (ref 70–99)
Glucose-Capillary: 112 mg/dL — ABNORMAL HIGH (ref 70–99)
Glucose-Capillary: 89 mg/dL (ref 70–99)
Glucose-Capillary: 93 mg/dL (ref 70–99)

## 2023-04-16 LAB — MAGNESIUM: Magnesium: 2.2 mg/dL (ref 1.7–2.4)

## 2023-04-16 MED ORDER — SENNA 8.6 MG PO TABS: 1.0000 | ORAL_TABLET | Freq: Two times a day (BID) | ORAL | 0 refills | Status: DC

## 2023-04-16 MED ORDER — FUROSEMIDE 40 MG PO TABS: 40.0000 mg | ORAL_TABLET | Freq: Every day | ORAL | Status: DC

## 2023-04-16 MED ORDER — POLYETHYLENE GLYCOL 3350 17 G PO PACK: 17.0000 g | PACK | Freq: Every day | ORAL | 0 refills | Status: DC | PRN

## 2023-04-16 NOTE — TOC Transition Note (Signed)
Transition of Care [facility]) - CM/SW Discharge Note   Patient Details  Name: Mikayla [name] MRN: [id number] Date of Birth: 04/12/1926  Transition of Care [facility]) CM/SW Contact:  Lorri Frederick, LCSW Phone Number: 04/16/2023, 12:49 PM   Clinical Narrative:   Pt discharging to [facility], room 501.  RN call report to [phone].    Final next level of care: Skilled Nursing Facility Barriers to Discharge: Barriers Resolved   Patient Goals and CMS Choice CMS Medicare.gov Compare Post Acute Care list provided to:: Patient Choice offered to / list presented to : Patient  Discharge Placement                Patient chooses bed at: [facility] Patient to be transferred to facility by: ptar Name of family member notified: daughter Olegario Messier Patient and family notified of of transfer: 04/16/23  Discharge Plan and Services Additional resources added to the After Visit Summary for   In-house Referral: Clinical Social Work                                   Social Determinants of Health (SDOH) Interventions SDOH Screenings   Food Insecurity: No Food Insecurity (04/12/2023)  Housing: Low Risk  (04/12/2023)  Transportation Needs: No Transportation Needs (04/12/2023)  Utilities: Not At Risk (04/12/2023)  Depression (PHQ2-9): Low Risk  (01/05/2023)  Tobacco Use: Low Risk  (04/13/2023)     Readmission Risk Interventions    01/17/2022   12:53 PM  Readmission Risk Prevention Plan  Transportation Screening Complete  PCP or Specialist Appt within 5-7 Days Complete  Home Care Screening Complete  Medication Review (RN CM) Complete

## 2023-04-16 NOTE — Progress Notes (Signed)
PROGRESS NOTE    Mikayla [name]  NWG:[id number] DOB: 1926/04/06 DOA: 04/12/2023 PCP: Caesar Bookman, NP   Brief Narrative:  89 y.o. female with medical history significant for type 2 diabetes, essential hypertension, hypothyroidism, HFpEF presented with a fall and was found to have proximal left femur fracture.  She underwent surgical intervention on 04/13/2023 by orthopedics.  Assessment & Plan:   Acute proximal left femur fracture status post mechanical fall -underwent surgical intervention on 04/13/2023 by orthopedics.  Wound care/DVT prophylaxis/pain management/activity as per orthopedics recommendations -Fall precautions -PT/OT recommended SNF placement.  TOC consulted. -Pain management  Acute blood loss anemia on top of anemia of chronic disease -Patient probably has anemia of chronic disease from chronic illnesses.  Hemoglobin dropped down to 7 on 04/14/2023.  Patient refused packed red cell transfusion.  Hemoglobin 6.9 on 04/15/2023: Patient initially refused transfusion but subsequently agreed and underwent 1 unit packed red cell transfusion.  Hemoglobin pending for today.   Monitor H&H.  Hyperkalemia -Resolved  Hyponatremia -Mild.  Labs pending today.  CKD stage IIIb -Creatinine stable.  Monitor.  Essential hypertension -Might have to resume antihypertensives.  Monitor blood pressure  Hyperlipidemia -Continue ezetimibe  Hypothyroidism -Continue levothyroxine  Diabetes mellitus type 2 -Metformin on hold.  Continue CBGs with SSI.  Last A1c was 6.1. -Carb modified diet  Goals of care -Palliative care following: CODE STATUS changed to DNR   DVT prophylaxis: Lovenox Code Status: DNR Family Communication: None at bedside Disposition Plan: Status is: Inpatient Remains inpatient appropriate because: Of severity of illness.  Need for SNF placement.  Currently medically stable for discharge to SNF  Consultants: Orthopedics  Procedures: As  above  Antimicrobials: Perioperative   Subjective: Patient seen and examined at bedside.  Hard of hearing.  Denies worsening shortness of breath, chest pain or fever.  Continues to have intermittent left hip pain.   Objective: Vitals:   04/15/23 1848 04/15/23 2301 04/16/23 0621 04/16/23 0734  BP: (!) 188/55 (!) 175/49 (!) 168/51 (!) 165/43  Pulse: 61 81 80 65  Resp: 14 16 18 17   Temp: 97.8 F (36.6 C) 97.8 F (36.6 C) 97.8 F (36.6 C) 98 F (36.7 C)  TempSrc: Oral Oral Oral Oral  SpO2: 98% 97% 98% 100%  Weight:      Height:        Intake/Output Summary (Last 24 hours) at 04/16/2023 0815 Last data filed at 04/15/2023 1847 Gross per 24 hour  Intake 472 ml  Output --  Net 472 ml   Filed Weights   04/12/23 1841  Weight: 52.2 kg    Examination:  General: On room air currently.  No acute distress.  Elderly female lying in bed.  Hard of hearing.  Slow to respond.  Poor historian. respiratory: Bilateral decreased breath sounds at bases bilaterally with scattered crackles CVS: Currently rate controlled; S1-S2 heard  abdominal: Soft, nontender, distended mildly, no organomegaly; normal bowel sounds are heard  extremities: No clubbing; mild lower extremity edema present   Data Reviewed: I have personally reviewed following labs and imaging studies  CBC: Recent Labs  Lab 04/12/23 1906 04/13/23 0603 04/13/23 1252 04/14/23 0321 04/15/23 0714  WBC 9.7 10.8* 10.4 8.0 7.6  NEUTROABS 7.0  --   --  6.0 5.7  HGB 9.9* 8.7* 8.3* 7.0* 6.9*  HCT 30.1* 26.7* 25.9* 21.9* 21.5*  MCV 92.6 92.7 94.5 93.2 93.9  PLT 210 167 172 150 138*   Basic Metabolic Panel: Recent Labs  Lab 04/12/23 1906 04/13/23  3086 04/13/23 1252 04/14/23 0321 04/15/23 0714  NA 134* 133*  --  134* 134*  K 5.0 5.7*  --  5.3* 4.8  CL 100 103  --  107 104  CO2 21* 20*  --  22 22  GLUCOSE 162* 130*  --  123* 119*  BUN 50* 52*  --  45* 46*  CREATININE 1.68* 1.75* 1.88* 1.70* 1.82*  CALCIUM 9.2 8.8*  --   8.5* 8.3*  MG  --  2.3  --   --  2.2  PHOS  --  4.9*  --   --   --    GFR: Estimated Creatinine Clearance: 13.7 mL/min (A) (by C-G formula based on SCr of 1.82 mg/dL (H)). Liver Function Tests: Recent Labs  Lab 04/12/23 1906  AST 12*  ALT 7  ALKPHOS 25*  BILITOT 0.4  PROT 6.7  ALBUMIN 3.9   No results for input(s): "LIPASE", "AMYLASE" in the last 168 hours. No results for input(s): "AMMONIA" in the last 168 hours. Coagulation Profile: No results for input(s): "INR", "PROTIME" in the last 168 hours. Cardiac Enzymes: No results for input(s): "CKTOTAL", "CKMB", "CKMBINDEX", "TROPONINI" in the last 168 hours. BNP (last 3 results) Recent Labs    04/25/22 1232  PROBNP 4,724*   HbA1C: No results for input(s): "HGBA1C" in the last 72 hours. CBG: Recent Labs  Lab 04/15/23 1613 04/15/23 2006 04/16/23 0019 04/16/23 0405 04/16/23 0732  GLUCAP 106* 99 103* 89 93   Lipid Profile: No results for input(s): "CHOL", "HDL", "LDLCALC", "TRIG", "CHOLHDL", "LDLDIRECT" in the last 72 hours. Thyroid Function Tests: No results for input(s): "TSH", "T4TOTAL", "FREET4", "T3FREE", "THYROIDAB" in the last 72 hours. Anemia Panel: No results for input(s): "VITAMINB12", "FOLATE", "FERRITIN", "TIBC", "IRON", "RETICCTPCT" in the last 72 hours. Sepsis Labs: No results for input(s): "PROCALCITON", "LATICACIDVEN" in the last 168 hours.  Recent Results (from the past 240 hour(s))  Surgical pcr screen     Status: None   Collection Time: 04/12/23 11:46 PM   Specimen: Nasal Mucosa; Nasal Swab  Result Value Ref Range Status   MRSA, PCR NEGATIVE NEGATIVE Final   Staphylococcus aureus NEGATIVE NEGATIVE Final    Comment: (NOTE) The Xpert SA Assay (FDA approved for NASAL specimens in patients 71 years of age and older), is one component of a comprehensive surveillance program. It is not intended to diagnose infection nor to guide or monitor treatment. Performed at [facility], [address], [place], [place] [address]          Radiology Studies: No results found.      Scheduled Meds:  docusate sodium  100 mg Oral BID   enoxaparin (LOVENOX) injection  30 mg Subcutaneous Q24H   ezetimibe  10 mg Oral Daily   insulin aspart  0-9 Units Subcutaneous Q4H   levothyroxine  50 mcg Oral QAC breakfast   mometasone-formoterol  2 puff Inhalation BID   senna  1 tablet Oral BID   Continuous Infusions:        Glade Lloyd, MD [facility] 04/16/2023, 8:15 AM

## 2023-04-16 NOTE — TOC CAGE-AID Note (Signed)
Transition of Care [facility]) - CAGE-AID Screening   Patient Details  Name: Mikayla [name] MRN: [id number] Date of Birth: 31-Aug-1925  [facility]) CM/SW Contact:    Janora Norlander, RN Phone Number: [phone] 04/16/2023, 1:35 PM   Clinical Narrative: Pt in hospital after sustaining a proximal left femur fracture due to a fall.  Pt denies drug or alcohol use and to be discharged to a SNF today. Screening complete.    CAGE-AID Screening:    Have You Ever Felt You Ought to Cut Down on Your Drinking or Drug Use?: No Have People Annoyed You By Critizing Your Drinking Or Drug Use?: No Have You Felt Bad Or Guilty About Your Drinking Or Drug Use?: No Have You Ever Had a Drink or Used Drugs First Thing In The Morning to Steady Your Nerves or to Get Rid of a Hangover?: No CAGE-AID Score: 0  Substance Abuse Education Offered: No

## 2023-04-16 NOTE — Discharge Summary (Signed)
Physician Discharge Summary  Mikayla [name] OEU:[id number] DOB: [birth year] DOA: 04/12/2023  PCP: Caesar Bookman, NP  Admit date: 04/12/2023 Discharge date: 04/16/2023  Admitted From: Home Disposition: SNF  Recommendations for Outpatient Follow-up:  Follow up with PCP in 1 week with repeat CBC/BMP Follow up in ED if symptoms worsen or new appear   Home Health: No Equipment/Devices: None  Discharge Condition: Stable CODE STATUS: DNR Diet recommendation: Heart healthy/carb modified  Brief/Interim Summary: 89 y.o. female with medical history significant for type 2 diabetes, essential hypertension, hypothyroidism, HFpEF presented with a fall and was found to have proximal left femur fracture.  She underwent surgical intervention on 04/13/2023 by orthopedics.  PT recommended SNF placement.  She will be discharged to SNF once bed is available.  Discharge Diagnoses:   Acute proximal left femur fracture status post mechanical fall -underwent surgical intervention on 04/13/2023 by orthopedics.  Wound care/DVT prophylaxis/pain management/activity as per orthopedics recommendations -Fall precautions -PT/OT recommended SNF placement.  She will be discharged to SNF once bed is available.   Acute blood loss anemia on top of anemia of chronic disease -Patient probably has anemia of chronic disease from chronic illnesses.  Hemoglobin dropped down to 7 on 04/14/2023.  Patient refused packed red cell transfusion.  Hemoglobin 6.9 on 04/15/2023: Patient initially refused transfusion but subsequently agreed and underwent 1 unit packed red cell transfusion.  Hemoglobin 8.9 today.   Monitor H&H intermittently as an outpatient.   Hyperkalemia -Resolved   Hyponatremia -Mild.  Encourage oral intake.  Outpatient follow-up.   CKD stage IIIb -Creatinine stable.  Monitor intermittently as an outpatient.   Essential hypertension -Blood pressure intermittently on the higher side.  Resume amlodipine,  bisoprolol, Lasix, losartan on discharge.  Hold hydralazine and isosorbide dinitrate.  This can be restarted as an outpatient if needed.  Chronic diastolic CHF -Currently compensated.  Antihypertensive plan as above.  Outpatient follow-up with Dr. Jacinto Halim.   Hyperlipidemia -Continue ezetimibe   Hypothyroidism -Continue levothyroxine   Diabetes mellitus type 2 -Resume metformin.  Carb modified diet..  Last A1c was 6.1.   Goals of care -Palliative care following: CODE STATUS changed to DNR   Discharge Instructions  Discharge Instructions     Diet - low sodium heart healthy   Complete by: As directed    Increase activity slowly   Complete by: As directed       Allergies as of 04/16/2023       Reactions   Amoxil [amoxicillin] Rash   Isosorb Dinitrate-hydralazine Other (See Comments)   Severe dizziness, altered mental status   Lipitor [atorvastatin] Other (See Comments)   Myalgias    Motrin [ibuprofen] Other (See Comments)   Told to avoid due to kidney function   Statins Other (See Comments)   Myalgias         Medication List     STOP taking these medications    Ensure Active High Protein Liqd   hydrALAZINE 50 MG tablet Commonly known as: APRESOLINE   isosorbide dinitrate 30 MG tablet Commonly known as: ISORDIL   ofloxacin 0.3 % ophthalmic solution Commonly known as: OCUFLOX       TAKE these medications    acetaminophen 500 MG tablet Commonly known as: TYLENOL Take 500 mg by mouth every 4 (four) hours as needed for mild pain or headache.   albuterol 108 (90 Base) MCG/ACT inhaler Commonly known as: VENTOLIN HFA Inhale 2 puffs into the lungs every 6 (six) hours as needed for wheezing or shortness  of breath.   amLODipine 10 MG tablet Commonly known as: NORVASC TAKE 1 TABLET BY MOUTH DAILY   aspirin 81 MG chewable tablet Commonly known as: Aspirin Childrens Chew 1 tablet (81 mg total) by mouth 2 (two) times daily with a meal.   bisoprolol 5 MG  tablet Commonly known as: ZEBETA TAKE 1 TABLET BY MOUTH 2 TIMES DAILY.   budesonide-formoterol 80-4.5 MCG/ACT inhaler Commonly known as: SYMBICORT Inhale 2 puffs into the lungs 2 (two) times daily as needed (shortness of breath/wheezing).   clopidogrel 75 MG tablet Commonly known as: PLAVIX TAKE 1 TABLET (75 MG TOTAL) BY MOUTH DAILY.   dorzolamide-timolol 2-0.5 % ophthalmic solution Commonly known as: COSOPT Place 1 drop into both eyes 2 (two) times daily.   ezetimibe 10 MG tablet Commonly known as: ZETIA TAKE 1 TABLET BY MOUTH DAILY   furosemide 40 MG tablet Commonly known as: LASIX Take 1 tablet (40 mg total) by mouth daily. Take 40mg  by mouth every other day. If shortness of breath, take twice daily for 3 days.   HYDROcodone-acetaminophen 5-325 MG tablet Commonly known as: NORCO/VICODIN Take 1 tablet by mouth every 4 (four) hours as needed for up to 7 days for moderate pain (pain score 4-6) or severe pain (pain score 7-10).   levothyroxine 50 MCG tablet Commonly known as: SYNTHROID TAKE 1 TABLET BY MOUTH DAILY BEFORE BREAKFAST.   losartan 25 MG tablet Commonly known as: COZAAR TAKE 1 TABLET BY MOUTH DAILY   metFORMIN 500 MG tablet Commonly known as: GLUCOPHAGE TAKE 1 TABLET (500 MG TOTAL) BY MOUTH DAILY WITH BREAKFAST.   nitroGLYCERIN 0.4 MG SL tablet Commonly known as: NITROSTAT Place 1 tablet (0.4 mg total) under the tongue every 5 (five) minutes as needed for chest pain.   polyethylene glycol 17 g packet Commonly known as: MIRALAX / GLYCOLAX Take 17 g by mouth daily as needed for mild constipation.   senna 8.6 MG Tabs tablet Commonly known as: SENOKOT Take 1 tablet (8.6 mg total) by mouth 2 (two) times daily.        Follow-up Information     Swinteck, Arlys John, MD. Schedule an appointment as soon as possible for a visit in 2 week(s).   Specialty: Orthopedic Surgery Why: For suture removal, For wound re-check Contact information: [address] [place] [address] [phone]                Allergies  Allergen Reactions   Amoxil [Amoxicillin] Rash   Isosorb Dinitrate-Hydralazine Other (See Comments)    Severe dizziness, altered mental status   Lipitor [Atorvastatin] Other (See Comments)    Myalgias    Motrin [Ibuprofen] Other (See Comments)    Told to avoid due to kidney function   Statins Other (See Comments)    Myalgias     Consultations: Orthopedic/palliative care   Procedures/Studies: DG Pelvis Portable  Result Date: 04/13/2023 CLINICAL DATA:  Close left subtrochanteric femur fracture. Postoperative radiographs. EXAM: PORTABLE PELVIS 1-2 VIEWS; LEFT FEMUR PORTABLE 2 VIEWS COMPARISON:  Left femur intraoperative fluoroscopy 04/13/2023, pelvis and left femur radiographs 04/12/2023 FINDINGS: There is diffuse decreased bone mineralization. Marrow anterolateral femoroacetabular joint space narrowing. Moderate severe pubic symphysis joint space narrowing, subchondral sclerosis, peripheral osteophytosis. Mild-to-moderate right and mild left sacroiliac joint space narrowing with mild subchondral sclerosis. New long left intramedullary nail fixation of the previously seen comminuted fracture of the proximal femoral diaphysis predominantly from a superolateral to inferior medial orientation. Improved alignment with resolution of the prior  mild varus angulation of the fracture. There is again a comminuted butterfly fracture fragment at the proximal lateral aspect of the fracture. No evidence of hardware failure. Moderate to severe lateral knee compartment joint space narrowing, subchondral sclerosis, peripheral osteophytosis. Moderate to severe patellofemoral joint space narrowing with moderate peripheral osteophytes. Moderate to high-grade atherosclerotic calcifications. Expected postoperative thigh subcutaneous air. Lateral proximal distal left thigh surgical staples. IMPRESSION: New long left intramedullary  nail fixation of the previously seen comminuted fracture of the proximal femoral diaphysis. Improved alignment with resolution of the prior mild varus angulation of the fracture. No evidence of hardware failure. Electronically Signed   By: Neita Garnet M.D.   On: 04/13/2023 12:14   DG FEMUR PORT MIN 2 VIEWS LEFT  Result Date: 04/13/2023 CLINICAL DATA:  Close left subtrochanteric femur fracture. Postoperative radiographs. EXAM: PORTABLE PELVIS 1-2 VIEWS; LEFT FEMUR PORTABLE 2 VIEWS COMPARISON:  Left femur intraoperative fluoroscopy 04/13/2023, pelvis and left femur radiographs 04/12/2023 FINDINGS: There is diffuse decreased bone mineralization. Marrow anterolateral femoroacetabular joint space narrowing. Moderate severe pubic symphysis joint space narrowing, subchondral sclerosis, peripheral osteophytosis. Mild-to-moderate right and mild left sacroiliac joint space narrowing with mild subchondral sclerosis. New long left intramedullary nail fixation of the previously seen comminuted fracture of the proximal femoral diaphysis predominantly from a superolateral to inferior medial orientation. Improved alignment with resolution of the prior mild varus angulation of the fracture. There is again a comminuted butterfly fracture fragment at the proximal lateral aspect of the fracture. No evidence of hardware failure. Moderate to severe lateral knee compartment joint space narrowing, subchondral sclerosis, peripheral osteophytosis. Moderate to severe patellofemoral joint space narrowing with moderate peripheral osteophytes. Moderate to high-grade atherosclerotic calcifications. Expected postoperative thigh subcutaneous air. Lateral proximal distal left thigh surgical staples. IMPRESSION: New long left intramedullary nail fixation of the previously seen comminuted fracture of the proximal femoral diaphysis. Improved alignment with resolution of the prior mild varus angulation of the fracture. No evidence of hardware  failure. Electronically Signed   By: Neita Garnet M.D.   On: 04/13/2023 12:14   DG FEMUR MIN 2 VIEWS LEFT  Result Date: 04/13/2023 CLINICAL DATA:  Elective surgery. EXAM: LEFT FEMUR 2 VIEWS COMPARISON:  Preoperative imaging FINDINGS: Eight fluoroscopic spot views of the left femur obtained in the operating room. Skull intra limit jet is during femoral intramedullary nail with trans trochanteric and distal locking screw fixation traversing proximal femur fracture. Fluoroscopy time 1 minutes 40 seconds. Dose 11.5 mGy IMPRESSION: Intraoperative fluoroscopy during proximal femur fracture fixation. Electronically Signed   By: Narda Rutherford M.D.   On: 04/13/2023 11:02   DG C-Arm 1-60 Min-No Report  Result Date: 04/13/2023 Fluoroscopy was utilized by the requesting physician.  No radiographic interpretation.   DG C-Arm 1-60 Min-No Report  Result Date: 04/13/2023 Fluoroscopy was utilized by the requesting physician.  No radiographic interpretation.   DG Chest Port 1 View  Result Date: 04/12/2023 CLINICAL DATA:  [id number]. Witnessed fall injury, left lower extremity shortening and pain, unable to move left lower extremity. EXAM: LEFT FEMUR 2 VIEWS; PORTABLE CHEST - 1 VIEW; RIGHT SHOULDER - 2+ VIEW; PELVIS - 1-2 VIEW COMPARISON:  PA and lateral chest 07/14/2022, and CT abdomen and pelvis and reformats 06/24/2008 FINDINGS: Chest AP portable 8:05 p.m.: The heart is mildly enlarged. No vascular congestion is seen. The aorta is tortuous and calcified with stable mediastinum. The lungs are clear. No displaced rib fracture or pneumothorax are seen. Osteopenia, degenerative change and mild levoscoliosis thoracic spine. No new  osseous findings. There is a tangle of overlying monitor wiring. Right shoulder, AP and transscapular Y-views only: Osteopenia. There is no evidence of fracture or dislocation. There are degenerative subcortical cystic changes along the humeral cuff insertion, slight spurring at the [facility]  joint and inferior glenohumeral joint. There is a somewhat high-riding appearance of the humerus which could be positional or due to chronic rotator cuff arthropathy. AP pelvis single view: Osteopenia. There is an acute oblique subtrochanteric proximal left femoral fracture, with mild medial angulation of the main distal fragment and less than 1 cortex width lateral displacement. There is a butterfly comminution fragment at the lateral proximal fracture margin which is somewhat laterally rotated. The pelvic rings are intact AP. No pelvic fracture or diastasis is seen. There are osteophytes of the SI joints, pubic symphysis. The visualized proximal right femur is unremarkable. There is mild arthrosis of both hips, pelvic enthesopathy. The iliofemoral arteries are heavily calcified. There is advanced degenerative change of the visualized lower lumbar spine. Multiple pelvic phleboliths. AP and cross-table lateral views left femur, total of 4 films: Above-described proximal femoral fracture is redemonstrated, with mild angulation and slight displacement. No fracture is seen in the remainder of the femur. There is moderate arthrosis at the knee. No suprapatellar effusion is evident. Heavy calcification continues down the superficial femoral artery and into the popliteal and popliteal trifurcation arteries. IMPRESSION: 1. Acute oblique subtrochanteric proximal left femoral fracture with mild angulation and slight displacement. 2. No fracture is seen in the remainder of the left femur. 3. No pelvic fracture or diastasis is seen. 4. No acute radiographic chest findings. Mild cardiomegaly. 5. Osteopenia and degenerative change right shoulder without evidence of fracture. 6. Heavy aortic and peripheral vascular atherosclerosis. Electronically Signed   By: Almira Bar M.D.   On: 04/12/2023 20:46   DG Shoulder Right  Result Date: 04/12/2023 CLINICAL DATA:  [id number]. Witnessed fall injury, left lower extremity shortening  and pain, unable to move left lower extremity. EXAM: LEFT FEMUR 2 VIEWS; PORTABLE CHEST - 1 VIEW; RIGHT SHOULDER - 2+ VIEW; PELVIS - 1-2 VIEW COMPARISON:  PA and lateral chest 07/14/2022, and CT abdomen and pelvis and reformats 06/24/2008 FINDINGS: Chest AP portable 8:05 p.m.: The heart is mildly enlarged. No vascular congestion is seen. The aorta is tortuous and calcified with stable mediastinum. The lungs are clear. No displaced rib fracture or pneumothorax are seen. Osteopenia, degenerative change and mild levoscoliosis thoracic spine. No new osseous findings. There is a tangle of overlying monitor wiring. Right shoulder, AP and transscapular Y-views only: Osteopenia. There is no evidence of fracture or dislocation. There are degenerative subcortical cystic changes along the humeral cuff insertion, slight spurring at the [facility] joint and inferior glenohumeral joint. There is a somewhat high-riding appearance of the humerus which could be positional or due to chronic rotator cuff arthropathy. AP pelvis single view: Osteopenia. There is an acute oblique subtrochanteric proximal left femoral fracture, with mild medial angulation of the main distal fragment and less than 1 cortex width lateral displacement. There is a butterfly comminution fragment at the lateral proximal fracture margin which is somewhat laterally rotated. The pelvic rings are intact AP. No pelvic fracture or diastasis is seen. There are osteophytes of the SI joints, pubic symphysis. The visualized proximal right femur is unremarkable. There is mild arthrosis of both hips, pelvic enthesopathy. The iliofemoral arteries are heavily calcified. There is advanced degenerative change of the visualized lower lumbar spine. Multiple pelvic phleboliths. AP and cross-table lateral views  left femur, total of 4 films: Above-described proximal femoral fracture is redemonstrated, with mild angulation and slight displacement. No fracture is seen in the remainder of the  femur. There is moderate arthrosis at the knee. No suprapatellar effusion is evident. Heavy calcification continues down the superficial femoral artery and into the popliteal and popliteal trifurcation arteries. IMPRESSION: 1. Acute oblique subtrochanteric proximal left femoral fracture with mild angulation and slight displacement. 2. No fracture is seen in the remainder of the left femur. 3. No pelvic fracture or diastasis is seen. 4. No acute radiographic chest findings. Mild cardiomegaly. 5. Osteopenia and degenerative change right shoulder without evidence of fracture. 6. Heavy aortic and peripheral vascular atherosclerosis. Electronically Signed   By: Almira Bar M.D.   On: 04/12/2023 20:46   DG Pelvis 1-2 Views  Result Date: 04/12/2023 CLINICAL DATA:  [id number]. Witnessed fall injury, left lower extremity shortening and pain, unable to move left lower extremity. EXAM: LEFT FEMUR 2 VIEWS; PORTABLE CHEST - 1 VIEW; RIGHT SHOULDER - 2+ VIEW; PELVIS - 1-2 VIEW COMPARISON:  PA and lateral chest 07/14/2022, and CT abdomen and pelvis and reformats 06/24/2008 FINDINGS: Chest AP portable 8:05 p.m.: The heart is mildly enlarged. No vascular congestion is seen. The aorta is tortuous and calcified with stable mediastinum. The lungs are clear. No displaced rib fracture or pneumothorax are seen. Osteopenia, degenerative change and mild levoscoliosis thoracic spine. No new osseous findings. There is a tangle of overlying monitor wiring. Right shoulder, AP and transscapular Y-views only: Osteopenia. There is no evidence of fracture or dislocation. There are degenerative subcortical cystic changes along the humeral cuff insertion, slight spurring at the [facility] joint and inferior glenohumeral joint. There is a somewhat high-riding appearance of the humerus which could be positional or due to chronic rotator cuff arthropathy. AP pelvis single view: Osteopenia. There is an acute oblique subtrochanteric proximal left femoral  fracture, with mild medial angulation of the main distal fragment and less than 1 cortex width lateral displacement. There is a butterfly comminution fragment at the lateral proximal fracture margin which is somewhat laterally rotated. The pelvic rings are intact AP. No pelvic fracture or diastasis is seen. There are osteophytes of the SI joints, pubic symphysis. The visualized proximal right femur is unremarkable. There is mild arthrosis of both hips, pelvic enthesopathy. The iliofemoral arteries are heavily calcified. There is advanced degenerative change of the visualized lower lumbar spine. Multiple pelvic phleboliths. AP and cross-table lateral views left femur, total of 4 films: Above-described proximal femoral fracture is redemonstrated, with mild angulation and slight displacement. No fracture is seen in the remainder of the femur. There is moderate arthrosis at the knee. No suprapatellar effusion is evident. Heavy calcification continues down the superficial femoral artery and into the popliteal and popliteal trifurcation arteries. IMPRESSION: 1. Acute oblique subtrochanteric proximal left femoral fracture with mild angulation and slight displacement. 2. No fracture is seen in the remainder of the left femur. 3. No pelvic fracture or diastasis is seen. 4. No acute radiographic chest findings. Mild cardiomegaly. 5. Osteopenia and degenerative change right shoulder without evidence of fracture. 6. Heavy aortic and peripheral vascular atherosclerosis. Electronically Signed   By: Almira Bar M.D.   On: 04/12/2023 20:46   DG FEMUR MIN 2 VIEWS LEFT  Result Date: 04/12/2023 CLINICAL DATA:  [id number]. Witnessed fall injury, left lower extremity shortening and pain, unable to move left lower extremity. EXAM: LEFT FEMUR 2 VIEWS; PORTABLE CHEST - 1 VIEW; RIGHT SHOULDER - 2+ VIEW; PELVIS -  1-2 VIEW COMPARISON:  PA and lateral chest 07/14/2022, and CT abdomen and pelvis and reformats 06/24/2008 FINDINGS: Chest AP  portable 8:05 p.m.: The heart is mildly enlarged. No vascular congestion is seen. The aorta is tortuous and calcified with stable mediastinum. The lungs are clear. No displaced rib fracture or pneumothorax are seen. Osteopenia, degenerative change and mild levoscoliosis thoracic spine. No new osseous findings. There is a tangle of overlying monitor wiring. Right shoulder, AP and transscapular Y-views only: Osteopenia. There is no evidence of fracture or dislocation. There are degenerative subcortical cystic changes along the humeral cuff insertion, slight spurring at the [facility] joint and inferior glenohumeral joint. There is a somewhat high-riding appearance of the humerus which could be positional or due to chronic rotator cuff arthropathy. AP pelvis single view: Osteopenia. There is an acute oblique subtrochanteric proximal left femoral fracture, with mild medial angulation of the main distal fragment and less than 1 cortex width lateral displacement. There is a butterfly comminution fragment at the lateral proximal fracture margin which is somewhat laterally rotated. The pelvic rings are intact AP. No pelvic fracture or diastasis is seen. There are osteophytes of the SI joints, pubic symphysis. The visualized proximal right femur is unremarkable. There is mild arthrosis of both hips, pelvic enthesopathy. The iliofemoral arteries are heavily calcified. There is advanced degenerative change of the visualized lower lumbar spine. Multiple pelvic phleboliths. AP and cross-table lateral views left femur, total of 4 films: Above-described proximal femoral fracture is redemonstrated, with mild angulation and slight displacement. No fracture is seen in the remainder of the femur. There is moderate arthrosis at the knee. No suprapatellar effusion is evident. Heavy calcification continues down the superficial femoral artery and into the popliteal and popliteal trifurcation arteries. IMPRESSION: 1. Acute oblique subtrochanteric  proximal left femoral fracture with mild angulation and slight displacement. 2. No fracture is seen in the remainder of the left femur. 3. No pelvic fracture or diastasis is seen. 4. No acute radiographic chest findings. Mild cardiomegaly. 5. Osteopenia and degenerative change right shoulder without evidence of fracture. 6. Heavy aortic and peripheral vascular atherosclerosis. Electronically Signed   By: Almira Bar M.D.   On: 04/12/2023 20:46      Subjective: Patient seen and examined at bedside.  Hard of hearing.  Denies worsening shortness of breath, chest pain or fever.  Continues to have intermittent left hip pain.    Discharge Exam: Vitals:   04/16/23 0621 04/16/23 0734  BP: (!) 168/51 (!) 165/43  Pulse: 80 65  Resp: 18 17  Temp: 97.8 F (36.6 C) 98 F (36.7 C)  SpO2: 98% 100%    General: On room air currently.  No acute distress.  Elderly female lying in bed.  Hard of hearing.  Slow to respond.  Poor historian. respiratory: Bilateral decreased breath sounds at bases bilaterally with scattered crackles CVS: Currently rate controlled; S1-S2 heard  abdominal: Soft, nontender, distended mildly, no organomegaly; normal bowel sounds are heard  extremities: No clubbing; mild lower extremity edema present    The results of significant diagnostics from this hospitalization (including imaging, microbiology, ancillary and laboratory) are listed below for reference.     Microbiology: Recent Results (from the past 240 hour(s))  Surgical pcr screen     Status: None   Collection Time: 04/12/23 11:46 PM   Specimen: Nasal Mucosa; Nasal Swab  Result Value Ref Range Status   MRSA, PCR NEGATIVE NEGATIVE Final   Staphylococcus aureus NEGATIVE NEGATIVE Final    Comment: (  NOTE) The Xpert SA Assay (FDA approved for NASAL specimens in patients 56 years of age and older), is one component of a comprehensive surveillance program. It is not intended to diagnose infection nor to guide or  monitor treatment. Performed at [facility], [address], [place], [place] [address]      Labs: BNP (last 3 results) No results for input(s): "BNP" in the last 8760 hours. Basic Metabolic Panel: Recent Labs  Lab 04/12/23 1906 04/13/23 0603 04/13/23 1252 04/14/23 0321 04/15/23 0714 04/16/23 0729  NA 134* 133*  --  134* 134* 134*  K 5.0 5.7*  --  5.3* 4.8 4.7  CL 100 103  --  107 104 106  CO2 21* 20*  --  22 22 20*  GLUCOSE 162* 130*  --  123* 119* 95  BUN 50* 52*  --  45* 46* 42*  CREATININE 1.68* 1.75* 1.88* 1.70* 1.82* 1.32*  CALCIUM 9.2 8.8*  --  8.5* 8.3* 8.4*  MG  --  2.3  --   --  2.2 2.2  PHOS  --  4.9*  --   --   --   --    Liver Function Tests: Recent Labs  Lab 04/12/23 1906  AST 12*  ALT 7  ALKPHOS 25*  BILITOT 0.4  PROT 6.7  ALBUMIN 3.9   No results for input(s): "LIPASE", "AMYLASE" in the last 168 hours. No results for input(s): "AMMONIA" in the last 168 hours. CBC: Recent Labs  Lab 04/12/23 1906 04/13/23 0603 04/13/23 1252 04/14/23 0321 04/15/23 0714 04/16/23 0729  WBC 9.7 10.8* 10.4 8.0 7.6 8.2  NEUTROABS 7.0  --   --  6.0 5.7 6.2  HGB 9.9* 8.7* 8.3* 7.0* 6.9* 8.9*  HCT 30.1* 26.7* 25.9* 21.9* 21.5* 26.8*  MCV 92.6 92.7 94.5 93.2 93.9 93.1  PLT 210 167 172 150 138* 153   Cardiac Enzymes: No results for input(s): "CKTOTAL", "CKMB", "CKMBINDEX", "TROPONINI" in the last 168 hours. BNP: Invalid input(s): "POCBNP" CBG: Recent Labs  Lab 04/15/23 2006 04/16/23 0019 04/16/23 0405 04/16/23 0732 04/16/23 1117  GLUCAP 99 103* 89 93 112*   D-Dimer No results for input(s): "DDIMER" in the last 72 hours. Hgb A1c No results for input(s): "HGBA1C" in the last 72 hours. Lipid Profile No results for input(s): "CHOL", "HDL", "LDLCALC", "TRIG", "CHOLHDL", "LDLDIRECT" in the last 72 hours. Thyroid function studies No results for input(s): "TSH", "T4TOTAL", "T3FREE", "THYROIDAB" in the last 72 hours.  Invalid input(s): "FREET3" Anemia  work up No results for input(s): "VITAMINB12", "FOLATE", "FERRITIN", "TIBC", "IRON", "RETICCTPCT" in the last 72 hours. Urinalysis    Component Value Date/Time   COLORURINE YELLOW 03/03/2014 1040   APPEARANCEUR CLEAR 03/03/2014 1040   LABSPEC 1.015 03/03/2014 1040   PHURINE 6.0 03/03/2014 1040   GLUCOSEU NEGATIVE 03/03/2014 1040   HGBUR NEGATIVE 03/03/2014 1040   BILIRUBINUR NEGATIVE 03/03/2014 1040   KETONESUR NEGATIVE 03/03/2014 1040   PROTEINUR NEGATIVE 06/24/2008 1118   UROBILINOGEN 0.2 03/03/2014 1040   NITRITE NEGATIVE 03/03/2014 1040   LEUKOCYTESUR MODERATE (A) 03/03/2014 1040   Sepsis Labs Recent Labs  Lab 04/13/23 1252 04/14/23 0321 04/15/23 0714 04/16/23 0729  WBC 10.4 8.0 7.6 8.2   Microbiology Recent Results (from the past 240 hour(s))  Surgical pcr screen     Status: None   Collection Time: 04/12/23 11:46 PM   Specimen: Nasal Mucosa; Nasal Swab  Result Value Ref Range Status   MRSA, PCR NEGATIVE NEGATIVE Final   Staphylococcus aureus  NEGATIVE NEGATIVE Final    Comment: (NOTE) The Xpert SA Assay (FDA approved for NASAL specimens in patients 68 years of age and older), is one component of a comprehensive surveillance program. It is not intended to diagnose infection nor to guide or monitor treatment. Performed at [facility], [address] [place], [place] [address]      Time coordinating discharge: 35 minutes  SIGNED:   Glade Lloyd, MD  [facility] 04/16/2023, 11:19 AM

## 2023-04-16 NOTE — TOC Progression Note (Addendum)
Transition of Care [facility] - Inpatient) - Progression Note    Patient Details  Name: Mikayla [name] MRN: [id number] Date of Birth: Jul 07, 1925  Transition of [facility]) CM/SW Contact  Lorri Frederick, LCSW Phone Number: 04/16/2023, 10:17 AM  Clinical Narrative:   Bed offers provided to pt on medicare choice document, she asked CSW to speak with her daughter Olegario Messier.  CSW spoke to [place] by phone, she also had copy of [email or web address].  Bed offers provided, she is asking for response from [facility].  CSW reached out to [employer].    1040: [facility] does make bed offer.  Daughter Olegario Messier informed, she does want to accept.    1110: [facility] confirms they can receive pt today.  MD informed.   Expected Discharge Plan: Skilled Nursing Facility Barriers to Discharge: Continued Medical Work up  Expected Discharge Plan and Services In-house Referral: Clinical Social Work     Living arrangements for the past 2 months: Single Family Home                                       Social Determinants of Health (SDOH) Interventions SDOH Screenings   Food Insecurity: No Food Insecurity (04/12/2023)  Housing: Low Risk  (04/12/2023)  Transportation Needs: No Transportation Needs (04/12/2023)  Utilities: Not At Risk (04/12/2023)  Depression (PHQ2-9): Low Risk  (01/05/2023)  Tobacco Use: Low Risk  (04/13/2023)    Readmission Risk Interventions    01/17/2022   12:53 PM  Readmission Risk Prevention Plan  Transportation Screening Complete  PCP or Specialist Appt within 5-7 Days Complete  Home Care Screening Complete  Medication Review (RN CM) Complete

## 2023-04-16 NOTE — Plan of Care (Signed)
  Problem: Education: Goal: Knowledge of General Education information will improve Description: Including pain rating scale, medication(s)/side effects and non-pharmacologic comfort measures Outcome: Not Progressing   Problem: Health Behavior/Discharge Planning: Goal: Ability to manage health-related needs will improve Outcome: Not Progressing   Problem: Clinical Measurements: Goal: Ability to maintain clinical measurements within normal limits will improve Outcome: Not Progressing Goal: Will remain free from infection Outcome: Not Progressing Goal: Diagnostic test results will improve Outcome: Not Progressing Goal: Respiratory complications will improve Outcome: Not Progressing Goal: Cardiovascular complication will be avoided Outcome: Not Progressing   Problem: Activity: Goal: Risk for activity intolerance will decrease Outcome: Not Progressing   Problem: Nutrition: Goal: Adequate nutrition will be maintained Outcome: Not Progressing   Problem: Coping: Goal: Level of anxiety will decrease Outcome: Not Progressing   Problem: Elimination: Goal: Will not experience complications related to bowel motility Outcome: Not Progressing Goal: Will not experience complications related to urinary retention Outcome: Not Progressing   Problem: Pain Management: Goal: General experience of comfort will improve Outcome: Not Progressing   Problem: Safety: Goal: Ability to remain free from injury will improve Outcome: Not Progressing   Problem: Skin Integrity: Goal: Risk for impaired skin integrity will decrease Outcome: Not Progressing   Problem: Education: Goal: Ability to describe self-care measures that may prevent or decrease complications (Diabetes Survival Skills Education) will improve Outcome: Not Progressing Goal: Individualized Educational Video(s) Outcome: Not Progressing   Problem: Coping: Goal: Ability to adjust to condition or change in health will  improve Outcome: Not Progressing   Problem: Fluid Volume: Goal: Ability to maintain a balanced intake and output will improve Outcome: Not Progressing   Problem: Health Behavior/Discharge Planning: Goal: Ability to identify and utilize available resources and services will improve Outcome: Not Progressing Goal: Ability to manage health-related needs will improve Outcome: Not Progressing   Problem: Metabolic: Goal: Ability to maintain appropriate glucose levels will improve Outcome: Not Progressing   Problem: Nutritional: Goal: Maintenance of adequate nutrition will improve Outcome: Not Progressing Goal: Progress toward achieving an optimal weight will improve Outcome: Not Progressing   Problem: Skin Integrity: Goal: Risk for impaired skin integrity will decrease Outcome: Not Progressing   Problem: Tissue Perfusion: Goal: Adequacy of tissue perfusion will improve Outcome: Not Progressing   Problem: Education: Goal: Verbalization of understanding the information provided (i.e., activity precautions, restrictions, etc) will improve Outcome: Not Progressing Goal: Individualized Educational Video(s) Outcome: Not Progressing   Problem: Activity: Goal: Ability to ambulate and perform ADLs will improve Outcome: Not Progressing   Problem: Clinical Measurements: Goal: Postoperative complications will be avoided or minimized Outcome: Not Progressing   Problem: Self-Concept: Goal: Ability to maintain and perform role responsibilities to the fullest extent possible will improve Outcome: Not Progressing   Problem: Pain Management: Goal: Pain level will decrease Outcome: Not Progressing

## 2023-04-16 NOTE — Care Management Important Message (Signed)
Important Message  Patient Details  Name: ROREE AKEMON MRN: [id number] Date of Birth: 1925/12/27   Important Message Given:  Yes - Medicare IM     Sherilyn Banker 04/16/2023, 3:16 PM

## 2023-04-17 LAB — BPAM RBC
Blood Product Expiration Date: 202412092359
Blood Product Unit Number: 202412232359
ISSUE DATE / TIME: 202412011443
Unit Type and Rh: 202412092359
Unit Type and Rh: 202412232359
Unit Type and Rh: 600
Unit Type and Rh: 600

## 2023-04-17 LAB — TYPE AND SCREEN
ABO/RH(D): A NEG
Antibody Screen: POSITIVE
Donor AG Type: NEGATIVE
Donor AG Type: NEGATIVE
PT AG Type: NEGATIVE
Unit division: 0
Unit division: 0

## 2023-04-18 DIAGNOSIS — S72002D Fracture of unspecified part of neck of left femur, subsequent encounter for closed fracture with routine healing: Secondary | ICD-10-CM | POA: Diagnosis not present

## 2023-04-18 DIAGNOSIS — E039 Hypothyroidism, unspecified: Secondary | ICD-10-CM | POA: Diagnosis not present

## 2023-04-18 DIAGNOSIS — I10 Essential (primary) hypertension: Secondary | ICD-10-CM | POA: Diagnosis not present

## 2023-04-18 DIAGNOSIS — I5030 Unspecified diastolic (congestive) heart failure: Secondary | ICD-10-CM | POA: Diagnosis not present

## 2023-04-18 DIAGNOSIS — D649 Anemia, unspecified: Secondary | ICD-10-CM | POA: Diagnosis not present

## 2023-04-18 DIAGNOSIS — N1832 Chronic kidney disease, stage 3b: Secondary | ICD-10-CM | POA: Diagnosis not present

## 2023-04-18 DIAGNOSIS — E119 Type 2 diabetes mellitus without complications: Secondary | ICD-10-CM | POA: Diagnosis not present

## 2023-04-18 DIAGNOSIS — E785 Hyperlipidemia, unspecified: Secondary | ICD-10-CM | POA: Diagnosis not present

## 2023-04-19 DIAGNOSIS — R2689 Other abnormalities of gait and mobility: Secondary | ICD-10-CM | POA: Diagnosis not present

## 2023-04-19 DIAGNOSIS — I5032 Chronic diastolic (congestive) heart failure: Secondary | ICD-10-CM | POA: Diagnosis not present

## 2023-04-19 DIAGNOSIS — I5030 Unspecified diastolic (congestive) heart failure: Secondary | ICD-10-CM | POA: Diagnosis not present

## 2023-04-19 DIAGNOSIS — N1832 Chronic kidney disease, stage 3b: Secondary | ICD-10-CM | POA: Diagnosis not present

## 2023-04-19 DIAGNOSIS — S7222XD Displaced subtrochanteric fracture of left femur, subsequent encounter for closed fracture with routine healing: Secondary | ICD-10-CM | POA: Diagnosis not present

## 2023-04-19 DIAGNOSIS — M6281 Muscle weakness (generalized): Secondary | ICD-10-CM | POA: Diagnosis not present

## 2023-04-19 DIAGNOSIS — S72002D Fracture of unspecified part of neck of left femur, subsequent encounter for closed fracture with routine healing: Secondary | ICD-10-CM | POA: Diagnosis not present

## 2023-04-19 DIAGNOSIS — D649 Anemia, unspecified: Secondary | ICD-10-CM | POA: Diagnosis not present

## 2023-04-23 ENCOUNTER — Inpatient Hospital Stay (HOSPITAL_BASED_OUTPATIENT_CLINIC_OR_DEPARTMENT_OTHER)
Admission: EM | Admit: 2023-04-23 | Discharge: 2023-05-16 | DRG: 377 | Disposition: E | Payer: Medicare Other | Source: Skilled Nursing Facility | Attending: Family Medicine | Admitting: Family Medicine

## 2023-04-23 ENCOUNTER — Encounter (HOSPITAL_BASED_OUTPATIENT_CLINIC_OR_DEPARTMENT_OTHER): Payer: Self-pay | Admitting: Emergency Medicine

## 2023-04-23 ENCOUNTER — Other Ambulatory Visit: Payer: Self-pay

## 2023-04-23 ENCOUNTER — Emergency Department (HOSPITAL_BASED_OUTPATIENT_CLINIC_OR_DEPARTMENT_OTHER): Payer: Medicare Other

## 2023-04-23 DIAGNOSIS — E860 Dehydration: Secondary | ICD-10-CM | POA: Diagnosis present

## 2023-04-23 DIAGNOSIS — D72829 Elevated white blood cell count, unspecified: Secondary | ICD-10-CM | POA: Diagnosis not present

## 2023-04-23 DIAGNOSIS — R079 Chest pain, unspecified: Secondary | ICD-10-CM | POA: Diagnosis present

## 2023-04-23 DIAGNOSIS — R0602 Shortness of breath: Secondary | ICD-10-CM | POA: Diagnosis not present

## 2023-04-23 DIAGNOSIS — Z825 Family history of asthma and other chronic lower respiratory diseases: Secondary | ICD-10-CM

## 2023-04-23 DIAGNOSIS — I129 Hypertensive chronic kidney disease with stage 1 through stage 4 chronic kidney disease, or unspecified chronic kidney disease: Secondary | ICD-10-CM | POA: Diagnosis not present

## 2023-04-23 DIAGNOSIS — G9341 Metabolic encephalopathy: Secondary | ICD-10-CM | POA: Diagnosis not present

## 2023-04-23 DIAGNOSIS — Z794 Long term (current) use of insulin: Secondary | ICD-10-CM | POA: Diagnosis not present

## 2023-04-23 DIAGNOSIS — D649 Anemia, unspecified: Secondary | ICD-10-CM | POA: Diagnosis present

## 2023-04-23 DIAGNOSIS — K222 Esophageal obstruction: Secondary | ICD-10-CM | POA: Diagnosis present

## 2023-04-23 DIAGNOSIS — Z955 Presence of coronary angioplasty implant and graft: Secondary | ICD-10-CM

## 2023-04-23 DIAGNOSIS — I252 Old myocardial infarction: Secondary | ICD-10-CM | POA: Diagnosis not present

## 2023-04-23 DIAGNOSIS — K922 Gastrointestinal hemorrhage, unspecified: Secondary | ICD-10-CM | POA: Diagnosis not present

## 2023-04-23 DIAGNOSIS — I4430 Unspecified atrioventricular block: Secondary | ICD-10-CM | POA: Diagnosis not present

## 2023-04-23 DIAGNOSIS — D329 Benign neoplasm of meninges, unspecified: Secondary | ICD-10-CM | POA: Diagnosis present

## 2023-04-23 DIAGNOSIS — Z515 Encounter for palliative care: Secondary | ICD-10-CM | POA: Diagnosis not present

## 2023-04-23 DIAGNOSIS — H9190 Unspecified hearing loss, unspecified ear: Secondary | ICD-10-CM | POA: Diagnosis present

## 2023-04-23 DIAGNOSIS — Z807 Family history of other malignant neoplasms of lymphoid, hematopoietic and related tissues: Secondary | ICD-10-CM

## 2023-04-23 DIAGNOSIS — R54 Age-related physical debility: Secondary | ICD-10-CM | POA: Diagnosis present

## 2023-04-23 DIAGNOSIS — R109 Unspecified abdominal pain: Secondary | ICD-10-CM | POA: Diagnosis not present

## 2023-04-23 DIAGNOSIS — G9389 Other specified disorders of brain: Secondary | ICD-10-CM | POA: Diagnosis not present

## 2023-04-23 DIAGNOSIS — N179 Acute kidney failure, unspecified: Secondary | ICD-10-CM | POA: Diagnosis present

## 2023-04-23 DIAGNOSIS — Z7951 Long term (current) use of inhaled steroids: Secondary | ICD-10-CM

## 2023-04-23 DIAGNOSIS — Z79890 Hormone replacement therapy (postmenopausal): Secondary | ICD-10-CM | POA: Diagnosis not present

## 2023-04-23 DIAGNOSIS — I130 Hypertensive heart and chronic kidney disease with heart failure and stage 1 through stage 4 chronic kidney disease, or unspecified chronic kidney disease: Secondary | ICD-10-CM | POA: Diagnosis not present

## 2023-04-23 DIAGNOSIS — Z66 Do not resuscitate: Secondary | ICD-10-CM | POA: Diagnosis not present

## 2023-04-23 DIAGNOSIS — E119 Type 2 diabetes mellitus without complications: Secondary | ICD-10-CM

## 2023-04-23 DIAGNOSIS — E871 Hypo-osmolality and hyponatremia: Secondary | ICD-10-CM | POA: Diagnosis not present

## 2023-04-23 DIAGNOSIS — N1832 Chronic kidney disease, stage 3b: Secondary | ICD-10-CM | POA: Diagnosis present

## 2023-04-23 DIAGNOSIS — E039 Hypothyroidism, unspecified: Secondary | ICD-10-CM | POA: Diagnosis present

## 2023-04-23 DIAGNOSIS — M6281 Muscle weakness (generalized): Secondary | ICD-10-CM | POA: Diagnosis not present

## 2023-04-23 DIAGNOSIS — R9431 Abnormal electrocardiogram [ECG] [EKG]: Secondary | ICD-10-CM | POA: Diagnosis not present

## 2023-04-23 DIAGNOSIS — I700 Atherosclerosis of aorta: Secondary | ICD-10-CM | POA: Diagnosis not present

## 2023-04-23 DIAGNOSIS — R413 Other amnesia: Secondary | ICD-10-CM | POA: Diagnosis not present

## 2023-04-23 DIAGNOSIS — N183 Chronic kidney disease, stage 3 unspecified: Secondary | ICD-10-CM | POA: Diagnosis not present

## 2023-04-23 DIAGNOSIS — W19XXXA Unspecified fall, initial encounter: Secondary | ICD-10-CM | POA: Diagnosis not present

## 2023-04-23 DIAGNOSIS — I5032 Chronic diastolic (congestive) heart failure: Secondary | ICD-10-CM

## 2023-04-23 DIAGNOSIS — Z7982 Long term (current) use of aspirin: Secondary | ICD-10-CM

## 2023-04-23 DIAGNOSIS — I2510 Atherosclerotic heart disease of native coronary artery without angina pectoris: Secondary | ICD-10-CM

## 2023-04-23 DIAGNOSIS — G9340 Encephalopathy, unspecified: Secondary | ICD-10-CM

## 2023-04-23 DIAGNOSIS — Z880 Allergy status to penicillin: Secondary | ICD-10-CM

## 2023-04-23 DIAGNOSIS — R4182 Altered mental status, unspecified: Secondary | ICD-10-CM | POA: Diagnosis not present

## 2023-04-23 DIAGNOSIS — K921 Melena: Principal | ICD-10-CM

## 2023-04-23 DIAGNOSIS — R110 Nausea: Secondary | ICD-10-CM | POA: Diagnosis present

## 2023-04-23 DIAGNOSIS — I959 Hypotension, unspecified: Secondary | ICD-10-CM | POA: Diagnosis not present

## 2023-04-23 DIAGNOSIS — E7800 Pure hypercholesterolemia, unspecified: Secondary | ICD-10-CM | POA: Diagnosis not present

## 2023-04-23 DIAGNOSIS — K5900 Constipation, unspecified: Secondary | ICD-10-CM | POA: Diagnosis present

## 2023-04-23 DIAGNOSIS — D62 Acute posthemorrhagic anemia: Principal | ICD-10-CM

## 2023-04-23 DIAGNOSIS — Z7984 Long term (current) use of oral hypoglycemic drugs: Secondary | ICD-10-CM

## 2023-04-23 DIAGNOSIS — Z7902 Long term (current) use of antithrombotics/antiplatelets: Secondary | ICD-10-CM

## 2023-04-23 DIAGNOSIS — K449 Diaphragmatic hernia without obstruction or gangrene: Secondary | ICD-10-CM | POA: Diagnosis not present

## 2023-04-23 DIAGNOSIS — E875 Hyperkalemia: Principal | ICD-10-CM | POA: Diagnosis present

## 2023-04-23 DIAGNOSIS — R739 Hyperglycemia, unspecified: Secondary | ICD-10-CM | POA: Diagnosis not present

## 2023-04-23 DIAGNOSIS — R000 Tachycardia, unspecified: Secondary | ICD-10-CM | POA: Diagnosis present

## 2023-04-23 DIAGNOSIS — Z90710 Acquired absence of both cervix and uterus: Secondary | ICD-10-CM

## 2023-04-23 DIAGNOSIS — S7222XD Displaced subtrochanteric fracture of left femur, subsequent encounter for closed fracture with routine healing: Secondary | ICD-10-CM | POA: Diagnosis not present

## 2023-04-23 DIAGNOSIS — H409 Unspecified glaucoma: Secondary | ICD-10-CM | POA: Diagnosis present

## 2023-04-23 DIAGNOSIS — Z7901 Long term (current) use of anticoagulants: Secondary | ICD-10-CM | POA: Diagnosis not present

## 2023-04-23 DIAGNOSIS — E1122 Type 2 diabetes mellitus with diabetic chronic kidney disease: Secondary | ICD-10-CM | POA: Diagnosis present

## 2023-04-23 DIAGNOSIS — Z9889 Other specified postprocedural states: Secondary | ICD-10-CM

## 2023-04-23 DIAGNOSIS — R569 Unspecified convulsions: Secondary | ICD-10-CM | POA: Diagnosis not present

## 2023-04-23 DIAGNOSIS — Z79899 Other long term (current) drug therapy: Secondary | ICD-10-CM

## 2023-04-23 DIAGNOSIS — Z801 Family history of malignant neoplasm of trachea, bronchus and lung: Secondary | ICD-10-CM

## 2023-04-23 DIAGNOSIS — R2689 Other abnormalities of gait and mobility: Secondary | ICD-10-CM | POA: Diagnosis not present

## 2023-04-23 DIAGNOSIS — Z888 Allergy status to other drugs, medicaments and biological substances status: Secondary | ICD-10-CM

## 2023-04-23 DIAGNOSIS — N3289 Other specified disorders of bladder: Secondary | ICD-10-CM | POA: Diagnosis not present

## 2023-04-23 DIAGNOSIS — M7989 Other specified soft tissue disorders: Secondary | ICD-10-CM | POA: Diagnosis not present

## 2023-04-23 LAB — BASIC METABOLIC PANEL
Anion gap: 8 (ref 5–15)
Anion gap: 10 (ref 5–15)
BUN: 135 mg/dL — ABNORMAL HIGH (ref 8–23)
BUN: 136 mg/dL — ABNORMAL HIGH (ref 8–23)
CO2: 23 mmol/L (ref 22–32)
CO2: 20 mmol/L — ABNORMAL LOW (ref 22–32)
Calcium: 8 mg/dL — ABNORMAL LOW (ref 8.9–10.3)
Calcium: 7.6 mg/dL — ABNORMAL LOW (ref 8.9–10.3)
Chloride: 97 mmol/L — ABNORMAL LOW (ref 98–111)
Chloride: 100 mmol/L (ref 98–111)
Creatinine, Ser: 1.83 mg/dL — ABNORMAL HIGH (ref 0.44–1.00)
Creatinine, Ser: 1.84 mg/dL — ABNORMAL HIGH (ref 0.44–1.00)
GFR, Estimated: 25 mL/min — ABNORMAL LOW (ref >60)
GFR, Estimated: 25 mL/min — ABNORMAL LOW (ref >60)
Glucose, Bld: 199 mg/dL — ABNORMAL HIGH (ref 70–99)
Glucose, Bld: 226 mg/dL — ABNORMAL HIGH (ref 70–99)
Potassium: 6.2 mmol/L — ABNORMAL HIGH (ref 3.5–5.1)
Potassium: 4.9 mmol/L (ref 3.5–5.1)
Sodium: 128 mmol/L — ABNORMAL LOW (ref 135–145)
Sodium: 130 mmol/L — ABNORMAL LOW (ref 135–145)

## 2023-04-23 LAB — FERRITIN: Ferritin: 65 ng/mL (ref 11–307)

## 2023-04-23 LAB — CBC
HCT: 17.3 % — ABNORMAL LOW (ref 36.0–46.0)
Hemoglobin: 5.7 g/dL — CL (ref 12.0–15.0)
MCH: 31 pg (ref 26.0–34.0)
MCHC: 32.9 g/dL (ref 30.0–36.0)
MCV: 94 fL (ref 80.0–100.0)
Platelets: 329 10*3/uL (ref 150–400)
RBC: 1.84 MIL/uL — ABNORMAL LOW (ref 3.87–5.11)
RDW: 13 % (ref 11.5–15.5)
WBC: 20.3 10*3/uL — ABNORMAL HIGH (ref 4.0–10.5)
nRBC: 0.1 % (ref 0.0–0.2)

## 2023-04-23 LAB — CBG MONITORING, ED: Glucose-Capillary: 243 mg/dL — ABNORMAL HIGH (ref 70–99)

## 2023-04-23 MED ORDER — PANTOPRAZOLE SODIUM 40 MG IV SOLR
40.0000 mg | Freq: Once | INTRAVENOUS | Status: AC
  Administered 2023-04-23: 40 mg via INTRAVENOUS
  Filled 2023-04-23: qty 10

## 2023-04-23 MED ORDER — SODIUM CHLORIDE 0.9 % IV BOLUS
500.0000 mL | Freq: Once | INTRAVENOUS | Status: AC
  Administered 2023-04-23: 500 mL via INTRAVENOUS

## 2023-04-23 MED ORDER — SODIUM ZIRCONIUM CYCLOSILICATE 10 G PO PACK
10.0000 g | PACK | Freq: Once | ORAL | Status: AC
  Administered 2023-04-23: 10 g via ORAL
  Filled 2023-04-23: qty 1

## 2023-04-23 MED ORDER — ALBUTEROL SULFATE (2.5 MG/3ML) 0.083% IN NEBU
10.0000 mg | INHALATION_SOLUTION | Freq: Once | RESPIRATORY_TRACT | Status: AC
  Administered 2023-04-23: 10 mg via RESPIRATORY_TRACT
  Filled 2023-04-23: qty 12

## 2023-04-23 MED ORDER — DEXTROSE 50 % IV SOLN
1.0000 | Freq: Once | INTRAVENOUS | Status: AC
  Administered 2023-04-23: 50 mL via INTRAVENOUS
  Filled 2023-04-23: qty 50

## 2023-04-23 MED ORDER — INSULIN ASPART 100 UNIT/ML IV SOLN
5.0000 [IU] | Freq: Once | INTRAVENOUS | Status: AC
  Administered 2023-04-23: 5 [IU] via INTRAVENOUS

## 2023-04-23 MED ORDER — SODIUM CHLORIDE 0.9% IV SOLUTION
Freq: Once | INTRAVENOUS | Status: AC
  Administered 2023-04-24: 250 mL via INTRAVENOUS

## 2023-04-23 NOTE — ED Notes (Signed)
Shortly after albuterol treatment began, patient refused to take anymore of treatment. EDP notified.

## 2023-04-23 NOTE — ED Triage Notes (Signed)
 of fenatnyl and 4mg  of zofran given by medics for left flank pain. Pain resolved

## 2023-04-23 NOTE — Plan of Care (Addendum)
Plan of Care Note for accepted transfer  Patient: Mikayla [name]              GNF:[id number]  DOA: 04/23/2023     Facility requesting transfer: [facility] emergency department Requesting Provider: Dr. Beckey Downing  Reason for transfer: Acute on chronic anemia-need for blood transfusion, AKI, hyponatremia and hyperkalemia.  Facility course: 89 year old female history of hypothyroidism, CKD stage IIIb, hyperlipidemia, DM type II, essential hypertension, heart failure with preserved ejection fraction, recent fracture of the left femur underwent surgical intervention 04/13/23 and chronic anemia presented to emergency department concern for abnormal lab.The patient was apparently pale appearing today and had labs checked.  Her BUN and creatinine were elevated.  She was sent to the ER at that time for further evaluation.  The patient's had some episodes where she has been staring off into space today.  She was recently admitted for a hip fracture and is currently in a rehab facility for this.  There has been no new injuries. She was complaining of some left flank pain with medics.  She received fentanyl for this with improvement.  Her family reports that she has not had a bowel movement in the past few days.  The patient is denying any complaints at this time after the fentanyl.  She states that she was feeling mildly nauseated earlier as well.  During last hospitalization 11/29 to 12/2 left-sided femoral fracture due to mechanical fall status post intramedullary fixation and patient developed acute blood loss anemia hemoglobin trended down to 7 without any active source of bleeding.  Patient initially refused blood transfusion then received 1 unit of RBC.    At presentation to ED patient has heart rate between 56-1 06.  Blood pressure within good range.  Respiratory rate at 24 and O2 sat 100% room air.  CBC showing leukocytosis 10.3 low RBC, low hemoglobin 5.7 (baseline hemoglobin around 7-9), low  hematocrit and normal platelet count.  BMP showing low sodium 128, elevated potassium 6.2, low chloride 97, elevated BUN 135, AKI 1.8, and low GFR 35.  Patient has hyperkalemia without any EKG change.  CT head: Heavily calcified meningioma along the medial aspect of the right sphenoid wing adjacent to the sella turcica. This is a chronic finding as it was present in 2006 although incompletely evaluated.   CT abdomen pelvis: No acute abnormality correspond with the given clinical history.  (Please refer to CT abdomen pelvis for detail information).  In the ED type and screen has been obtained, patient received albuterol nebulizer.  Treated for hyperkalemia and Protonix 40 mg IV.  For AKI patient received NS bolus 500 mL.  Patient does not have any evidence of GI bleed.  Denies any noticing blood in her stool.  Patient is hemodynamically stable.  Unclear etiology of low hemoglobin at this moment.  Hospitalist has been contacted for further evaluation management of acute on chronic anemia-need for blood transfusion, hyperkalemia, AKI and hyponatremia.  Spoke with Dr. Rhae Hammock, requested to check the FOBT.  There is no blood transfusion available at [facility] ED.   Plan of care: The patient is accepted for admission for inpatient status to Progressive unit, at [facility].  Check [email or web address] for on-call coverage.  [facility] will assume care on arrival to accepting facility. Until arrival, medical decision making responsibilities remain with the EDP.  However, [facility] available 24/7 for questions and assistance.   Nursing staff please page [facility] Admits and Consults [phone]) as soon as the patient arrives to the  hospital.    Author: Tereasa Coop, MD  04/23/2023  [facility]

## 2023-04-23 NOTE — ED Notes (Signed)
Report called to [facility] and given to [facility] [place]. Medics at bedside to transport pt

## 2023-04-23 NOTE — Plan of Care (Signed)
As there is no progressive unit bed available at [name] patient will be transferred to [name] to  [facility]ik[name]  [name] so at least can start the blood transfusion over here and Mikayla [name] team will be informed once patient comes there.

## 2023-04-23 NOTE — ED Provider Notes (Signed)
[place] EMERGENCY DEPARTMENT AT [facility] Provider Note   CSN: [id number] Arrival date & time: 04/23/23  1931     History  Chief Complaint  Patient presents with   Abnormal Lab    Pt coming from nursing facility with elevated BUN lab    Mikayla [name] is a 89 y.o. female.  89 year old female with past medical history of diabetes and hypertension presenting to the emergency department today with concern for abnormal labs.  The patient was apparently pale appearing today and had labs checked.  Her BUN and creatinine were elevated.  She was sent to the ER at that time for further evaluation.  The patient's had some episodes where she has been staring off into space today.  She was recently admitted for a hip fracture and is currently in a rehab facility for this.  There has been no new injuries.  She was sent to the ER today for further evaluation regarding this.  She was complaining of some left flank pain with medics.  She received fentanyl for this with improvement.  Her family reports that she has not had a bowel movement in the past few days.  The patient is denying any complaints at this time after the fentanyl.  She states that she was feeling mildly nauseated earlier as well.   Abnormal Lab      Home Medications Prior to Admission medications   Medication Sig Start Date End Date Taking? Authorizing Provider  acetaminophen (TYLENOL) 500 MG tablet Take 500 mg by mouth every 4 (four) hours as needed for mild pain or headache.    [provider]  albuterol (VENTOLIN HFA) 108 (90 Base) MCG/ACT inhaler Inhale 2 puffs into the lungs every 6 (six) hours as needed for wheezing or shortness of breath. 10/21/21   Sharon Seller, NP  amLODipine (NORVASC) 10 MG tablet TAKE 1 TABLET BY MOUTH DAILY 03/12/23   Ngetich, Dinah C, NP  aspirin (ASPIRIN CHILDRENS) 81 MG chewable tablet Chew 1 tablet (81 mg total) by mouth 2 (two) times daily with a meal. 04/14/23 05/29/23   Hill, Alain Honey, PA-C  bisoprolol (ZEBETA) 5 MG tablet TAKE 1 TABLET BY MOUTH 2 TIMES DAILY. 12/21/22   Sharon Seller, NP  budesonide-formoterol (SYMBICORT) 80-4.5 MCG/ACT inhaler Inhale 2 puffs into the lungs 2 (two) times daily as needed (shortness of breath/wheezing). 12/16/20   Parrett, Virgel Bouquet, NP  clopidogrel (PLAVIX) 75 MG tablet TAKE 1 TABLET (75 MG TOTAL) BY MOUTH DAILY. 01/11/23 01/11/24  Yates Decamp, MD  dorzolamide-timolol (COSOPT) 22.3-6.8 MG/ML ophthalmic solution Place 1 drop into both eyes 2 (two) times daily.  04/18/19   [provider]  ezetimibe (ZETIA) 10 MG tablet TAKE 1 TABLET BY MOUTH DAILY 10/18/22   Yates Decamp, MD  furosemide (LASIX) 40 MG tablet Take 1 tablet (40 mg total) by mouth daily. Take 40mg  by mouth every other day. If shortness of breath, take twice daily for 3 days. 04/16/23   Glade Lloyd, MD  levothyroxine (SYNTHROID) 50 MCG tablet TAKE 1 TABLET BY MOUTH DAILY BEFORE BREAKFAST. 05/10/22   Nyoka Cowden, MD  losartan (COZAAR) 25 MG tablet TAKE 1 TABLET BY MOUTH DAILY 10/10/22   Yates Decamp, MD  metFORMIN (GLUCOPHAGE) 500 MG tablet TAKE 1 TABLET (500 MG TOTAL) BY MOUTH DAILY WITH BREAKFAST. 03/12/23   Ngetich, Dinah C, NP  nitroGLYCERIN (NITROSTAT) 0.4 MG SL tablet Place 1 tablet (0.4 mg total) under the tongue every 5 (five) minutes as needed  for chest pain. 01/17/22   Yates Decamp, MD  polyethylene glycol (MIRALAX / GLYCOLAX) 17 g packet Take 17 g by mouth daily as needed for mild constipation. 04/16/23   Glade Lloyd, MD  senna (SENOKOT) 8.6 MG TABS tablet Take 1 tablet (8.6 mg total) by mouth 2 (two) times daily. 04/16/23   Glade Lloyd, MD      Allergies    Amoxil [amoxicillin], Isosorb dinitrate-hydralazine, Lipitor [atorvastatin], Motrin [ibuprofen], and Statins    Review of Systems   Review of Systems  Genitourinary:  Positive for flank pain.  All other systems reviewed and are negative.   Physical Exam Updated Vital Signs BP (!) 122/32   Pulse  (!) 56   Temp 98.1 F (36.7 C) (Oral)   Resp (!) 24   Ht 4\' 11"  (1.499 m)   Wt 55.5 kg   SpO2 100%   BMI 24.70 kg/m  Physical Exam Vitals and nursing note reviewed.   Gen: NAD, pale appearing Eyes: PERRL, EOMI HEENT: no oropharyngeal swelling Neck: trachea midline Resp: clear to auscultation bilaterally Card: RRR, no murmurs, rubs, or gallops Abd: nontender, nondistended Extremities: no calf tenderness, no edema Vascular: 2+ radial pulses bilaterally, 2+ DP pulses bilaterally Neuro: Cranial nerves intact, equal strength and sensation throughout bilateral upper and lower extremities, the patient has somewhat slow to answer questions Skin: no rashes Psyc: acting appropriately   ED Results / Procedures / Treatments   Labs (all labs ordered are listed, but only abnormal results are displayed) Labs Reviewed  BASIC METABOLIC PANEL - Abnormal; Notable for the following components:      Result Value   Sodium 128 (*)    Potassium 6.2 (*)    Chloride 97 (*)    Glucose, Bld 199 (*)    BUN 135 (*)    Creatinine, Ser 1.83 (*)    Calcium 8.0 (*)    GFR, Estimated 25 (*)    All other components within normal limits  CBC - Abnormal; Notable for the following components:   WBC 20.3 (*)    RBC 1.84 (*)    Hemoglobin 5.7 (*)    HCT 17.3 (*)    All other components within normal limits  CBG MONITORING, ED - Abnormal; Notable for the following components:   Glucose-Capillary 243 (*)    All other components within normal limits  OCCULT BLOOD X 1 CARD TO LAB, STOOL  BASIC METABOLIC PANEL  VITAMIN B12  FOLATE  IRON AND TIBC  FERRITIN  RETICULOCYTES  TYPE AND SCREEN    EKG None  Radiology CT ABDOMEN PELVIS WO CONTRAST  Result Date: 04/23/2023 CLINICAL DATA:  Left-sided flank pain EXAM: CT ABDOMEN AND PELVIS WITHOUT CONTRAST TECHNIQUE: Multidetector CT imaging of the abdomen and pelvis was performed following the standard protocol without IV contrast. RADIATION DOSE REDUCTION:  This exam was performed according to the departmental dose-optimization program which includes automated exposure control, adjustment of the mA and/or kV according to patient size and/or use of iterative reconstruction technique. COMPARISON:  06/24/2008 FINDINGS: Lower chest: No acute abnormality. Hepatobiliary: No focal liver abnormality is seen. No gallstones, gallbladder wall thickening, or biliary dilatation. Pancreas: Unremarkable. No pancreatic ductal dilatation or surrounding inflammatory changes. Spleen: Normal in size without focal abnormality. Adrenals/Urinary Tract: Adrenal glands are within normal limits. Kidneys are well visualized bilaterally. Mild atrophic changes are noted in the left kidney. No renal calculi or obstructive changes are seen. The bladder is partially distended. Stomach/Bowel: Scattered fecal material is noted throughout the  colon. No obstructive changes are seen. The appendix is within normal limits. Small bowel and stomach are unremarkable. Vascular/Lymphatic: Aortic atherosclerosis. No enlarged abdominal or pelvic lymph nodes. Reproductive: Status post hysterectomy. No adnexal masses. Other: No abdominal wall hernia or abnormality. No abdominopelvic ascites. Musculoskeletal: Prior operative fixation of a left femoral fracture is seen. Degenerative changes of the lumbar spine are noted. No acute bony abnormality is noted. Chronic L1 compression deformity is seen. IMPRESSION: Chronic changes as described above. No acute abnormality correspond with the given clinical history. Electronically Signed   By: Alcide Clever M.D.   On: 04/23/2023 20:49   CT Head Wo Contrast  Result Date: 04/23/2023 CLINICAL DATA:  Recent memory loss, initial encounter EXAM: CT HEAD WITHOUT CONTRAST TECHNIQUE: Contiguous axial images were obtained from the base of the skull through the vertex without intravenous contrast. RADIATION DOSE REDUCTION: This exam was performed according to the departmental  dose-optimization program which includes automated exposure control, adjustment of the mA and/or kV according to patient size and/or use of iterative reconstruction technique. COMPARISON:  01/10/2005 FINDINGS: Brain: Chronic atrophic changes are noted. Dystrophic calcification is noted in the deep white matter on the left. No acute hemorrhage or acute infarction is seen. Heavily calcified lesion is noted arising from the right sphenoid wing adjacent to the sella turcica consistent with a meningioma. This measures 2.1 x 2.1 by 1.6 cm in greatest AP, transverse and craniocaudad projections. When compared with the prior sinus CT from 2006 the lesion was present although incompletely evaluated on that limited exam. Vascular: No hyperdense vessel or unexpected calcification. Skull: Normal. Negative for fracture or focal lesion. Sinuses/Orbits: No acute finding. Other: None. IMPRESSION: Heavily calcified meningioma along the medial aspect of the right sphenoid wing adjacent to the sella turcica. This is a chronic finding as it was present in 2006 although incompletely evaluated. Mild atrophic changes without acute abnormality. Electronically Signed   By: Alcide Clever M.D.   On: 04/23/2023 20:46    Procedures Procedures    Medications Ordered in ED Medications  sodium chloride 0.9 % bolus 500 mL (500 mLs Intravenous New Bag/Given 04/23/23 2025)  pantoprazole (PROTONIX) injection 40 mg (40 mg Intravenous Given 04/23/23 2055)  sodium zirconium cyclosilicate (LOKELMA) packet 10 g (10 g Oral Given 04/23/23 2127)  albuterol (PROVENTIL) (2.5 MG/3ML) 0.083% nebulizer solution 10 mg (10 mg Nebulization Given 04/23/23 2124)  insulin aspart (novoLOG) injection 5 Units (5 Units Intravenous Given 04/23/23 2129)    And  dextrose 50 % solution 50 mL (50 mLs Intravenous Given 04/23/23 2126)    ED Course/ Medical Decision Making/ A&P                                 Medical Decision Making 89 year old female with past  medical history of diabetes and hypertension presented to the emergency department today with concern for abnormal labs as well as some episodes of staring off into space earlier today.  I will obtain labs here and give the patient some IV fluids as there was concern for some dehydration.  Will obtain a CT scan of her head to evaluate for intracranial hemorrhage or mass lesion or delayed bleed from her fall.  Will also obtain a CT scan of her abdomen to evaluate for her flank pain she was having.  She reports this has resolved with the fentanyl.  I will reevaluate for ultimate disposition.  The patient's globin was  low at 5.7.  We do not have blood here to give the patient at the freestanding ER.  A type and screen is ordered to be done at [facility] when she arrives there.  I talked with Dr. Francene Castle who accepts the patient for admission.  The patient also found to be hyperkalemic.  She is given medications for this including Lokelma as well as IV medications.  Her EKG does not show any acute findings suggestive of this.  The patient is given IV fluids for this as well.  She will be sent ED to ED transfer.  Dr. Doran Durand accepts the patient and Dr. Francene Castle will see the patient when they arrive at [facility].  Critical care time 42 minutes including reassessments, review of old records, treatment of hyperkalemia, coordination of care with hospitalist service  Amount and/or Complexity of Data Reviewed Labs: ordered. Radiology: ordered.  Risk OTC drugs. Prescription drug management. Decision regarding hospitalization.          Final Clinical Impression(s) / ED Diagnoses Final diagnoses:  Hyperkalemia  Anemia, unspecified type    Rx / DC Orders ED Discharge Orders     None         Durwin Glaze, MD 04/23/23 2225

## 2023-04-23 NOTE — ED Notes (Signed)
Report given to the next RN.Marland KitchenMarland Kitchen

## 2023-04-24 ENCOUNTER — Inpatient Hospital Stay (HOSPITAL_COMMUNITY): Payer: Medicare Other

## 2023-04-24 ENCOUNTER — Other Ambulatory Visit (HOSPITAL_COMMUNITY): Payer: Medicare Other

## 2023-04-24 DIAGNOSIS — D649 Anemia, unspecified: Secondary | ICD-10-CM

## 2023-04-24 DIAGNOSIS — I2510 Atherosclerotic heart disease of native coronary artery without angina pectoris: Secondary | ICD-10-CM

## 2023-04-24 DIAGNOSIS — I252 Old myocardial infarction: Secondary | ICD-10-CM

## 2023-04-24 DIAGNOSIS — I5032 Chronic diastolic (congestive) heart failure: Secondary | ICD-10-CM

## 2023-04-24 DIAGNOSIS — R4182 Altered mental status, unspecified: Secondary | ICD-10-CM

## 2023-04-24 DIAGNOSIS — R569 Unspecified convulsions: Secondary | ICD-10-CM

## 2023-04-24 DIAGNOSIS — G9340 Encephalopathy, unspecified: Secondary | ICD-10-CM

## 2023-04-24 LAB — URINALYSIS, ROUTINE W REFLEX MICROSCOPIC
Bilirubin Urine: NEGATIVE
Glucose, UA: NEGATIVE mg/dL
Hgb urine dipstick: NEGATIVE
Ketones, ur: NEGATIVE mg/dL
Leukocytes,Ua: NEGATIVE
Nitrite: NEGATIVE
Protein, ur: NEGATIVE mg/dL
Specific Gravity, Urine: 1.012 (ref 1.005–1.030)
pH: 5 (ref 5.0–8.0)

## 2023-04-24 LAB — COMPREHENSIVE METABOLIC PANEL
ALT: 9 U/L (ref 0–44)
AST: 15 U/L (ref 15–41)
Albumin: 2.1 g/dL — ABNORMAL LOW (ref 3.5–5.0)
Alkaline Phosphatase: 27 U/L — ABNORMAL LOW (ref 38–126)
Anion gap: 11 (ref 5–15)
BUN: 133 mg/dL — ABNORMAL HIGH (ref 8–23)
CO2: 18 mmol/L — ABNORMAL LOW (ref 22–32)
Calcium: 7.8 mg/dL — ABNORMAL LOW (ref 8.9–10.3)
Chloride: 103 mmol/L (ref 98–111)
Creatinine, Ser: 1.98 mg/dL — ABNORMAL HIGH (ref 0.44–1.00)
GFR, Estimated: 23 mL/min — ABNORMAL LOW (ref >60)
Glucose, Bld: 172 mg/dL — ABNORMAL HIGH (ref 70–99)
Potassium: 5.2 mmol/L — ABNORMAL HIGH (ref 3.5–5.1)
Sodium: 132 mmol/L — ABNORMAL LOW (ref 135–145)
Total Bilirubin: 0.6 mg/dL (ref <1.2)
Total Protein: 4.4 g/dL — ABNORMAL LOW (ref 6.5–8.1)

## 2023-04-24 LAB — CBG MONITORING, ED
Glucose-Capillary: 168 mg/dL — ABNORMAL HIGH (ref 70–99)
Glucose-Capillary: 116 mg/dL — ABNORMAL HIGH (ref 70–99)
Glucose-Capillary: 108 mg/dL — ABNORMAL HIGH (ref 70–99)

## 2023-04-24 LAB — IRON AND TIBC
Iron: 78 ug/dL (ref 28–170)
Saturation Ratios: 36 % — ABNORMAL HIGH (ref 10.4–31.8)
TIBC: 220 ug/dL — ABNORMAL LOW (ref 250–450)
UIBC: 142 ug/dL

## 2023-04-24 LAB — PROCALCITONIN: Procalcitonin: 0.56 ng/mL

## 2023-04-24 LAB — VITAMIN B12
Vitamin B-12: 595 pg/mL (ref 180–914)
Vitamin B-12: 719 pg/mL (ref 180–914)

## 2023-04-24 LAB — HEMOGLOBIN AND HEMATOCRIT, BLOOD
HCT: 24.3 % — ABNORMAL LOW (ref 36.0–46.0)
HCT: 22.9 % — ABNORMAL LOW (ref 36.0–46.0)
Hemoglobin: 8.2 g/dL — ABNORMAL LOW (ref 12.0–15.0)
Hemoglobin: 7.8 g/dL — ABNORMAL LOW (ref 12.0–15.0)

## 2023-04-24 LAB — TSH: TSH: 2.256 u[IU]/mL (ref 0.350–4.500)

## 2023-04-24 LAB — RETICULOCYTES
Immature Retic Fract: 27.6 % — ABNORMAL HIGH (ref 2.3–15.9)
RBC.: 1.83 MIL/uL — ABNORMAL LOW (ref 3.87–5.11)
Retic Count, Absolute: 76.6 10*3/uL (ref 19.0–186.0)
Retic Ct Pct: 4.2 % — ABNORMAL HIGH (ref 0.4–3.1)

## 2023-04-24 LAB — GLUCOSE, CAPILLARY
Glucose-Capillary: 133 mg/dL — ABNORMAL HIGH (ref 70–99)
Glucose-Capillary: 108 mg/dL — ABNORMAL HIGH (ref 70–99)

## 2023-04-24 LAB — CBC
HCT: 17.6 % — ABNORMAL LOW (ref 36.0–46.0)
Hemoglobin: 5.6 g/dL — CL (ref 12.0–15.0)
MCH: 29.6 pg (ref 26.0–34.0)
MCHC: 31.8 g/dL (ref 30.0–36.0)
MCV: 93.1 fL (ref 80.0–100.0)
Platelets: 263 10*3/uL (ref 150–400)
RBC: 1.89 MIL/uL — ABNORMAL LOW (ref 3.87–5.11)
RDW: 13.2 % (ref 11.5–15.5)
WBC: 18.5 10*3/uL — ABNORMAL HIGH (ref 4.0–10.5)
nRBC: 0 % (ref 0.0–0.2)

## 2023-04-24 LAB — BRAIN NATRIURETIC PEPTIDE: B Natriuretic Peptide: 193.6 pg/mL — ABNORMAL HIGH (ref 0.0–100.0)

## 2023-04-24 LAB — PREPARE RBC (CROSSMATCH)

## 2023-04-24 LAB — TROPONIN I (HIGH SENSITIVITY): Troponin I (High Sensitivity): 13 ng/L (ref <18)

## 2023-04-24 LAB — FOLATE: Folate: 6.8 ng/mL (ref >5.9)

## 2023-04-24 MED ORDER — SENNA 8.6 MG PO TABS
1.0000 | ORAL_TABLET | Freq: Two times a day (BID) | ORAL | Status: DC
  Administered 2023-04-25 – 2023-04-27 (×3): 8.6 mg via ORAL
  Filled 2023-04-24 (×7): qty 1

## 2023-04-24 MED ORDER — PANTOPRAZOLE SODIUM 40 MG IV SOLR
40.0000 mg | Freq: Two times a day (BID) | INTRAVENOUS | Status: DC
  Administered 2023-04-24 – 2023-04-28 (×10): 40 mg via INTRAVENOUS
  Filled 2023-04-24 (×10): qty 10

## 2023-04-24 MED ORDER — INSULIN ASPART 100 UNIT/ML IJ SOLN: 0.0000 [IU] | Freq: Every day | INTRAMUSCULAR | Status: DC

## 2023-04-24 MED ORDER — POLYETHYLENE GLYCOL 3350 17 G PO PACK
17.0000 g | PACK | Freq: Two times a day (BID) | ORAL | Status: DC
  Administered 2023-04-24 – 2023-04-27 (×4): 17 g via ORAL
  Filled 2023-04-24 (×6): qty 1

## 2023-04-24 MED ORDER — BISACODYL 10 MG RE SUPP
10.0000 mg | Freq: Once | RECTAL | Status: AC
  Administered 2023-04-24: 10 mg via RECTAL
  Filled 2023-04-24: qty 1

## 2023-04-24 MED ORDER — MOMETASONE FURO-FORMOTEROL FUM 100-5 MCG/ACT IN AERO
2.0000 | INHALATION_SPRAY | Freq: Two times a day (BID) | RESPIRATORY_TRACT | Status: DC
  Administered 2023-04-25 – 2023-04-26 (×2): 2 via RESPIRATORY_TRACT
  Filled 2023-04-24: qty 8.8

## 2023-04-24 MED ORDER — ALBUTEROL SULFATE (2.5 MG/3ML) 0.083% IN NEBU
3.0000 mL | INHALATION_SOLUTION | Freq: Four times a day (QID) | RESPIRATORY_TRACT | Status: DC | PRN
  Administered 2023-04-29: 3 mL via RESPIRATORY_TRACT
  Filled 2023-04-24: qty 3

## 2023-04-24 MED ORDER — DORZOLAMIDE HCL-TIMOLOL MAL 2-0.5 % OP SOLN
1.0000 [drp] | Freq: Two times a day (BID) | OPHTHALMIC | Status: DC
  Administered 2023-04-24 – 2023-04-28 (×9): 1 [drp] via OPHTHALMIC
  Filled 2023-04-24: qty 10

## 2023-04-24 MED ORDER — ACETAMINOPHEN 650 MG RE SUPP: 650.0000 mg | Freq: Four times a day (QID) | RECTAL | Status: DC | PRN

## 2023-04-24 MED ORDER — NITROGLYCERIN 0.4 MG SL SUBL: 0.4000 mg | SUBLINGUAL_TABLET | SUBLINGUAL | Status: DC | PRN

## 2023-04-24 MED ORDER — SODIUM CHLORIDE 0.9 % IV SOLN: INTRAVENOUS | Status: AC

## 2023-04-24 MED ORDER — LEVOTHYROXINE SODIUM 50 MCG PO TABS
50.0000 ug | ORAL_TABLET | Freq: Every day | ORAL | Status: DC
Start: 2023-04-25 — End: 2023-04-29
  Administered 2023-04-25 – 2023-04-28 (×3): 50 ug via ORAL
  Filled 2023-04-24 (×4): qty 1

## 2023-04-24 MED ORDER — SODIUM CHLORIDE 0.9 % IV SOLN: INTRAVENOUS | Status: DC

## 2023-04-24 MED ORDER — ACETAMINOPHEN 325 MG PO TABS
650.0000 mg | ORAL_TABLET | Freq: Four times a day (QID) | ORAL | Status: DC | PRN
  Administered 2023-04-25 – 2023-04-26 (×3): 650 mg via ORAL
  Filled 2023-04-24 (×3): qty 2

## 2023-04-24 MED ORDER — INSULIN ASPART 100 UNIT/ML IJ SOLN
0.0000 [IU] | Freq: Three times a day (TID) | INTRAMUSCULAR | Status: DC
  Administered 2023-04-26: 2 [IU] via SUBCUTANEOUS
  Administered 2023-04-26 – 2023-04-27 (×3): 1 [IU] via SUBCUTANEOUS
  Administered 2023-04-28: 2 [IU] via SUBCUTANEOUS
  Administered 2023-04-28 (×2): 1 [IU] via SUBCUTANEOUS

## 2023-04-24 MED ORDER — EZETIMIBE 10 MG PO TABS
10.0000 mg | ORAL_TABLET | Freq: Every day | ORAL | Status: DC
Start: 2023-04-24 — End: 2023-04-29
  Administered 2023-04-24 – 2023-04-27 (×4): 10 mg via ORAL
  Filled 2023-04-24 (×4): qty 1

## 2023-04-24 NOTE — ED Provider Notes (Signed)
  Physical Exam  BP (!) 117/34 (BP Location: Right Arm)   Pulse (!) 139   Temp 97.9 F (36.6 C) (Oral)   Resp (!) 25   Ht 4\' 11"  (1.499 m)   Wt 55.5 kg   SpO2 100%   BMI 24.70 kg/m   Physical Exam Vitals and nursing note reviewed. Exam conducted with a chaperone present.  Constitutional:      General: She is not in acute distress.    Appearance: She is well-developed. She is not diaphoretic.  HENT:     Head: Normocephalic and atraumatic.     Nose: Nose normal.  Eyes:     Pupils: Pupils are equal, round, and reactive to light.  Cardiovascular:     Rate and Rhythm: Normal rate and regular rhythm.     Pulses: Normal pulses.     Heart sounds: Normal heart sounds.  Pulmonary:     Effort: Pulmonary effort is normal. No respiratory distress.     Breath sounds: Normal breath sounds.  Abdominal:     General: Bowel sounds are normal. There is no distension.     Palpations: Abdomen is soft.     Tenderness: There is no abdominal tenderness. There is no guarding or rebound.  Genitourinary:    Vagina: No vaginal discharge.  Musculoskeletal:        General: Normal range of motion.     Cervical back: Neck supple.  Skin:    General: Skin is dry.     Capillary Refill: Capillary refill takes less than 2 seconds.     Coloration: Skin is pale.     Findings: No erythema or rash.  Neurological:     General: No focal deficit present.     Mental Status: She is alert.     Deep Tendon Reflexes: Reflexes normal.  Psychiatric:        Mood and Affect: Mood normal.     Procedures  .Critical Care  Performed by: Cy Blamer, MD Authorized by: Cy Blamer, MD   Critical care provider statement:    Critical care time (minutes):  30   Critical care end time:  04/24/2023 12:17 AM   Critical care was necessary to treat or prevent imminent or life-threatening deterioration of the following conditions: gi bleed.   Critical care was time spent personally by me on the following activities:   Examination of patient, pulse oximetry, ordering and review of laboratory studies and ordering and performing treatments and interventions   Care discussed with: admitting provider   Comments:     Consulted GI by chat and hospitalist for admission. Blood initiated by me    ED Course / MDM    Medical Decision Making Amount and/or Complexity of Data Reviewed External Data Reviewed: notes.    Details: Previous notes reviewed Labs: ordered.    Details: Hemoglobin low 5.7, potassium normal 4.9 Radiology: ordered.  Risk OTC drugs. Prescription drug management. Decision regarding hospitalization.     The patient appears reasonably stabilized for admission considering the current resources, flow, and capabilities available in the ED at this time, and I doubt any other [facility] requiring further screening and/or treatment in the ED prior to admission.      Kaikoa Magro, MD 04/24/23 [phone]

## 2023-04-24 NOTE — Consult Note (Addendum)
Consultation  Referring Provider: [facility]/ Jackquline Bosch Primary Care Physician:  Caesar Bookman, NP Primary Gastroenterologist:  Dr.Jacobs 2019  Reason for Consultation:  normocytic anemia  HPI: Mikayla [name] is a 89 y.o. female currently residing in a rehab facility after sustaining a hip fracture on 04/12/2023.  She underwent intramedullary fixation of the left femur.  Patient's was anemic during that admission with hemoglobin dropping to 6.9 after surgery, she was transfused 1 unit of packed RBCs and discharged with a hemoglobin in the 8 range.  Reviewing her labs she has had a hemoglobin baseline of around 10.  During that admission she also had palliative care consultation, and she changed her status to DNR/DNI, and opted to limit some aggressive care. She does have history of coronary artery disease status post PCI in September 2023, she is maintained on Plavix, currently also on aspirin., Chronic kidney disease stage IIIb, has history of hyperlipidemia diabetes mellitus, thyroidism congestive heart failure with preserved EF. She was noted to be pale at the rehab facility yesterday and labs were checked.  She had also apparently complained of some left flank pain and has not had a bowel movement for a couple of days. On arrival here WBC 20.3, hemoglobin 5.7/hematocrit 17.3/MCV 94/platelets 329 Sodium 128/potassium 6.2/BUN 135/creatinine 1.83 Ferritin is 65 remainder of iron studies B12 and folate all pending  She has been transfused 2 units of packed RBCs, tolerated well  CT head yesterday showed a heavily calcified meningioma chronic finding no acute findings CT abdomen and pelvis without contrast yesterday-scattered fecal material throughout the colon no obstructive changes small bowel and stomach unremarkable. Chest x-ray today no active disease  Blood cultures are pending, procalcitonin within normal limits  UA negative BNP 193 this morning  Patient denies any GI complaints  today, she denies any abdominal pain, no nausea or vomiting, she is unaware of any melena or hematochezia did say that she had not had a bowel movement for couple of days.  No nausea or vomiting.  She does have some mild dysphagia symptoms which been chronic for her she says she does well as long as she can sit up when she eats. No prior colonoscopies in our system. She did have EGD in 2019 after a food impaction and was found to have a focal peptic stricture versus a ring at the GE junction this was TTS dilated to 20 mm per Dr. Christella Hartigan. Patient relates that she would not want to have any procedures done at her age unless it was an emergency.    Past Medical History:  Diagnosis Date   Chronic rhinitis    CKD (chronic kidney disease), stage III (HCC)    Disorder of bone and cartilage, unspecified    Essential hypertension, benign    Glaucoma    Per [facility] new patient packet   Hearing loss    Per [facility] new patient packet   High blood pressure    Per [facility] new patient packet   High cholesterol    Per [facility] new patient packet   Obesity, unspecified    Other and unspecified hyperlipidemia    Type II or unspecified type diabetes mellitus without mention of complication, not stated as uncontrolled    Vision loss    Due to catarct Per [facility] new patient packet    Past Surgical History:  Procedure Laterality Date   CESAREAN SECTION N/A    CORONARY/GRAFT ACUTE MI REVASCULARIZATION N/A 01/15/2022   Procedure: Coronary/Graft Acute MI Revascularization;  Surgeon: Yates Decamp, MD;  Location: [facility] INVASIVE CV LAB;  Service: Cardiovascular;  Laterality: N/A;   ESOPHAGOGASTRODUODENOSCOPY N/A 04/29/2014   Procedure: ESOPHAGOGASTRODUODENOSCOPY (EGD);  Surgeon: Rachael Fee, MD;  Location: [facility];  Service: Endoscopy;  Laterality: N/A;   INTRAMEDULLARY (IM) NAIL INTERTROCHANTERIC Left 04/13/2023   Procedure: OPEN TREATMENT OF LEFT PROXIMAL FEMUR FRACTURE WITH INTERMEDULLARY NAILING;  Surgeon: Samson Frederic,  MD;  Location: [facility] OR;  Service: Orthopedics;  Laterality: Left;   LEFT HEART CATH AND CORONARY ANGIOGRAPHY N/A 01/15/2022   Procedure: LEFT HEART CATH AND CORONARY ANGIOGRAPHY;  Surgeon: Yates Decamp, MD;  Location: [facility] INVASIVE CV LAB;  Service: Cardiovascular;  Laterality: N/A;   TOTAL ABDOMINAL HYSTERECTOMY      Prior to Admission medications   Medication Sig Start Date End Date Taking? Authorizing Provider  acetaminophen (TYLENOL) 500 MG tablet Take 500 mg by mouth every 4 (four) hours as needed for mild pain or headache.   Yes [provider]  albuterol (VENTOLIN HFA) 108 (90 Base) MCG/ACT inhaler Inhale 2 puffs into the lungs every 6 (six) hours as needed for wheezing or shortness of breath. 10/21/21  Yes Sharon Seller, NP  amLODipine (NORVASC) 10 MG tablet TAKE 1 TABLET BY MOUTH DAILY 03/12/23  Yes Ngetich, Dinah C, NP  aspirin (ASPIRIN CHILDRENS) 81 MG chewable tablet Chew 1 tablet (81 mg total) by mouth 2 (two) times daily with a meal. 04/14/23 05/29/23 Yes Hill, Avery S, PA-C  bisoprolol (ZEBETA) 5 MG tablet TAKE 1 TABLET BY MOUTH 2 TIMES DAILY. 12/21/22  Yes Sharon Seller, NP  budesonide-formoterol (SYMBICORT) 80-4.5 MCG/ACT inhaler Inhale 2 puffs into the lungs 2 (two) times daily as needed (shortness of breath/wheezing). 12/16/20  Yes Parrett, Tammy S, NP  clopidogrel (PLAVIX) 75 MG tablet TAKE 1 TABLET (75 MG TOTAL) BY MOUTH DAILY. 01/11/23 01/11/24 Yes Yates Decamp, MD  dorzolamide-timolol (COSOPT) 22.3-6.8 MG/ML ophthalmic solution Place 1 drop into both eyes 2 (two) times daily.  04/18/19  Yes [provider]  ezetimibe (ZETIA) 10 MG tablet TAKE 1 TABLET BY MOUTH DAILY 10/18/22  Yes Yates Decamp, MD  furosemide (LASIX) 40 MG tablet Take 1 tablet (40 mg total) by mouth daily. Take 40mg  by mouth every other day. If shortness of breath, take twice daily for 3 days. 04/16/23  Yes Glade Lloyd, MD  levothyroxine (SYNTHROID) 50 MCG tablet TAKE 1 TABLET BY MOUTH DAILY BEFORE  BREAKFAST. 05/10/22  Yes Nyoka Cowden, MD  losartan (COZAAR) 25 MG tablet TAKE 1 TABLET BY MOUTH DAILY 10/10/22  Yes Yates Decamp, MD  metFORMIN (GLUCOPHAGE) 500 MG tablet TAKE 1 TABLET (500 MG TOTAL) BY MOUTH DAILY WITH BREAKFAST. 03/12/23  Yes Ngetich, Dinah C, NP  nitroGLYCERIN (NITROSTAT) 0.4 MG SL tablet Place 1 tablet (0.4 mg total) under the tongue every 5 (five) minutes as needed for chest pain. 01/17/22  Yes Yates Decamp, MD  polyethylene glycol (MIRALAX / GLYCOLAX) 17 g packet Take 17 g by mouth daily as needed for mild constipation. 04/16/23  Yes Glade Lloyd, MD  senna (SENOKOT) 8.6 MG TABS tablet Take 1 tablet (8.6 mg total) by mouth 2 (two) times daily. 04/16/23  Yes Glade Lloyd, MD    Current Facility-Administered Medications  Medication Dose Route Frequency Provider Last Rate Last Admin   0.9 %  sodium chloride infusion   Intravenous Continuous Briant Cedar, MD 100 mL/hr at 04/24/23 0742 New Bag at 04/24/23 0742   acetaminophen (TYLENOL) tablet 650 mg  650 mg Oral  Q6H PRN John Giovanni, MD       Or   acetaminophen (TYLENOL) suppository 650 mg  650 mg Rectal Q6H PRN John Giovanni, MD       insulin aspart (novoLOG) injection 0-5 Units  0-5 Units Subcutaneous QHS John Giovanni, MD       insulin aspart (novoLOG) injection 0-9 Units  0-9 Units Subcutaneous TID WC John Giovanni, MD       pantoprazole (PROTONIX) injection 40 mg  40 mg Intravenous Q12H John Giovanni, MD       Current Outpatient Medications  Medication Sig Dispense Refill   acetaminophen (TYLENOL) 500 MG tablet Take 500 mg by mouth every 4 (four) hours as needed for mild pain or headache.     albuterol (VENTOLIN HFA) 108 (90 Base) MCG/ACT inhaler Inhale 2 puffs into the lungs every 6 (six) hours as needed for wheezing or shortness of breath. 8 g 2   amLODipine (NORVASC) 10 MG tablet TAKE 1 TABLET BY MOUTH DAILY 90 tablet 1   aspirin (ASPIRIN CHILDRENS) 81 MG chewable tablet Chew 1 tablet  (81 mg total) by mouth 2 (two) times daily with a meal. 90 tablet 0   bisoprolol (ZEBETA) 5 MG tablet TAKE 1 TABLET BY MOUTH 2 TIMES DAILY. 180 tablet 1   budesonide-formoterol (SYMBICORT) 80-4.5 MCG/ACT inhaler Inhale 2 puffs into the lungs 2 (two) times daily as needed (shortness of breath/wheezing). 1 each 5   clopidogrel (PLAVIX) 75 MG tablet TAKE 1 TABLET (75 MG TOTAL) BY MOUTH DAILY. 90 tablet 3   dorzolamide-timolol (COSOPT) 22.3-6.8 MG/ML ophthalmic solution Place 1 drop into both eyes 2 (two) times daily.      ezetimibe (ZETIA) 10 MG tablet TAKE 1 TABLET BY MOUTH DAILY 90 tablet 1   furosemide (LASIX) 40 MG tablet Take 1 tablet (40 mg total) by mouth daily. Take 40mg  by mouth every other day. If shortness of breath, take twice daily for 3 days.     levothyroxine (SYNTHROID) 50 MCG tablet TAKE 1 TABLET BY MOUTH DAILY BEFORE BREAKFAST. 90 tablet 3   losartan (COZAAR) 25 MG tablet TAKE 1 TABLET BY MOUTH DAILY 90 tablet 1   metFORMIN (GLUCOPHAGE) 500 MG tablet TAKE 1 TABLET (500 MG TOTAL) BY MOUTH DAILY WITH BREAKFAST. 90 tablet 1   nitroGLYCERIN (NITROSTAT) 0.4 MG SL tablet Place 1 tablet (0.4 mg total) under the tongue every 5 (five) minutes as needed for chest pain. 25 tablet 2   polyethylene glycol (MIRALAX / GLYCOLAX) 17 g packet Take 17 g by mouth daily as needed for mild constipation. 14 each 0   senna (SENOKOT) 8.6 MG TABS tablet Take 1 tablet (8.6 mg total) by mouth 2 (two) times daily. 60 tablet 0    Allergies as of 04/23/2023 - Review Complete 04/23/2023  Allergen Reaction Noted   Amoxil [amoxicillin] Rash 03/28/2011   Isosorb dinitrate-hydralazine Other (See Comments) 03/28/2022   Lipitor [atorvastatin] Other (See Comments)    Motrin [ibuprofen] Other (See Comments) 01/16/2022   Statins Other (See Comments) 09/09/2021    Family History  Problem Relation Age of Onset   COPD Mother    Lung cancer Father    Multiple myeloma Son    Prostate cancer Other    Colon cancer Neg  Hx    Esophageal cancer Neg Hx    Rectal cancer Neg Hx    Stomach cancer Neg Hx     Social History   Socioeconomic History   Marital status: Married    Spouse  name: Not on file   Number of children: 3   Years of education: Not on file   Highest education level: Not on file  Occupational History   Not on file  Tobacco Use   Smoking status: Never   Smokeless tobacco: Never  Vaping Use   Vaping status: Never Used  Substance and Sexual Activity   Alcohol use: Yes    Alcohol/week: 7.0 standard drinks of alcohol    Types: 7 Standard drinks or equivalent per week    Comment: wine with dinner   Drug use: No   Sexual activity: Not on file  Other Topics Concern   Not on file  Social History Narrative   Diet      Do you drink/eat things with caffeine  YES      Marital Status Widowed  What year were you married?  1953      Do you live in a house, apartment, assisted living, condo, trailer, etc.?  House      Is it one or more stories?  One      How many persons live in your home?  1         Do you have any pets in your home?(please list)  No      Highest level of education completed:  High School      Current or past profession:  Insurance account manager      Do you exercise?:  NO    Type and how often:      Do you have a Living Will? (Form that indicates scenarios where you would not want your life prolonged)  YES      Do you have a DNR form?   YES      If not, would you like to discuss one?      Do you have signed POA/HPOA forms?  YES      Do you have difficulty bathing or dressing yourself?  NO      Do you have difficulty preparing food or eating?  NO      Do you have difficulty managing medications?  NO      Do you have difficulty managing your finances?  NO      Do you have difficulty affording your medications? NO                     Social Determinants of Health   Financial Resource Strain: Not on file  Food Insecurity: No Food Insecurity (04/12/2023)    Hunger Vital Sign    Worried About Running Out of Food in the Last Year: Never true    Ran Out of Food in the Last Year: Never true  Transportation Needs: No Transportation Needs (04/12/2023)   PRAPARE - Administrator, Civil Service (Medical): No    Lack of Transportation (Non-Medical): No  Physical Activity: Not on file  Stress: Not on file  Social Connections: Not on file  Intimate Partner Violence: Not At Risk (04/12/2023)   Humiliation, Afraid, Rape, and Kick questionnaire    Fear of Current or Ex-Partner: No    Emotionally Abused: No    Physically Abused: No    Sexually Abused: No    Review of Systems: Pertinent positive and negative review of systems were noted in the above HPI section.  All other review of systems was otherwise negative. Marland Kitchen  Physical Exam: Vital signs in last 24 hours: Temp:  [97.4 F (36.3 C)-98.9 F (37.2  C)] 98.7 F (37.1 C) (12/10 0730) Pulse Rate:  [56-139] 57 (12/10 0730) Resp:  [16-26] 19 (12/10 0730) BP: (112-148)/(29-76) 148/35 (12/10 0730) SpO2:  [95 %-100 %] 100 % (12/10 0730) Weight:  [55.5 kg] 55.5 kg (12/09 1943)   General:   Alert,  Well-developed, very elderly pale white female pleasant and cooperative in NAD-hard of hearing Head:  Normocephalic and atraumatic. Eyes:  Sclera clear, no icterus.   Conjunctiva pale Ears:  Normal auditory acuity. Nose:  No deformity, discharge,  or lesions. Mouth:  No deformity or lesions.   Neck:  Supple; no masses or thyromegaly. Lungs:  Clear throughout to auscultation.   No wheezes, crackles, or rhonchi.  Heart:  Regular rate and rhythm; no murmurs, clicks, rubs,  or gallops. Abdomen:  Soft,nontender, BS active,nonpalp mass or hsm.  Midline scar Rectal: Not done-stool for occult blood pending Msk:  Symmetrical without gross deformities. . Pulses:  Normal pulses noted. Extremities: Long dressing along the left Lateral hip and thigh Neurologic:  Alert and  oriented x4;  grossly normal  neurologically.  Hard of hearing Skin:  Intact without significant lesions or rashes.. Psych:  Alert and cooperative. Normal mood and affect.  Intake/Output from previous day: No intake/output data recorded. Intake/Output this shift: No intake/output data recorded.  Lab Results: Recent Labs    04/23/23 1954 04/24/23 0253  WBC 20.3* 18.5*  HGB 5.7* 5.6*  HCT 17.3* 17.6*  PLT 329 263   BMET Recent Labs    04/23/23 1954 04/23/23 2235 04/24/23 0253  NA 128* 130* 132*  K 6.2* 4.9 5.2*  CL 97* 100 103  CO2 23 20* 18*  GLUCOSE 199* 226* 172*  BUN 135* 136* 133*  CREATININE 1.83* 1.84* 1.98*  CALCIUM 8.0* 7.6* 7.8*   LFT Recent Labs    04/24/23 0253  PROT 4.4*  ALBUMIN 2.1*  AST 15  ALT 9  ALKPHOS 27*  BILITOT 0.6   PT/INR No results for input(s): "LABPROT", "INR" in the last 72 hours. Hepatitis Panel No results for input(s): "HEPBSAG", "HCVAB", "HEPAIGM", "HEPBIGM" in the last 72 hours.    IMPRESSION:  #30 89 year old white female brought to the emergency room after labs done at her current rehab facility were abnormal with hypokalemia, elevated BUN, and hemoglobin of 5.7 She is on chronic aspirin and Plavix  Patient has not had any evidence of any obvious GI blood loss, no melena or hematochezia, had not had a bowel movement for couple days.  She has no current GI symptoms, not been on a PPI  Anemia is normocytic, Hemoccult is pending Patient does have history of chronic anemia with baseline hemoglobin closer to 10, hemoglobin down to 6.9 during her very recent admission with hip fracture and she required transfusion.  Certainly she may have had some recent occult GI blood loss for melena or hematochezia/ Suspect the anemia may be multifactoral in setting of her chronic kidney disease, and very recent surgery  #2 status post left femur fracture 04/12/2023 surgical repair #3 diabetes mellitus #4 chronic kidney disease stage III #5.  Coronary artery disease  status post MI 2023 with PCI #6 prior history of distal esophageal stricture versus ring status post dilation 2019  Plan; okay for regular diet today Trend hemoglobin and transfuse to keep hemoglobin 7 Start Protonix 40 mg once daily Await pending labs Patient clearly stated that she does not want to undergo any endoscopic procedures plan supportive management for now. GI will follow with you  Amy EsterwoodPA-C  04/24/2023, 10:29 AM   I have taken an interval history, thoroughly reviewed the chart and examined the patient. I agree with the Advanced Practitioner's note, impression and recommendations, and have recorded additional findings, impressions and recommendations below. I performed a substantive portion of this encounter (>50% time spent), including a complete performance of the medical decision making.  My additional thoughts are as follows:  Worsening of acute postoperative anemia. Markedly elevated BUN, though she is not demonstrating hematemesis, melena or bright red blood per rectum.  She has been severely constipated lately as described by her daughter, and I am concerned she may also be volume depleted with a low sodium and elevated BUN and creatinine.  Patient has received 2 units PRBCs, and will be monitored closely for any signs of overt GI bleeding. She is on Plavix, so there is always the possibility of GI bleeding in elderly patient, especially with recent physiologic stressors of fracture and orthopedic surgery.  No current plans for endoscopic testing unless she demonstrates overt GI bleeding. Should that occur, we would need to have further discussions with her and her family to see if that is in keeping with her wishes.  (DNR)  We we will advise on treatments to relieve her obstipation.  MiraLAX has been started, we will also order a dose of Duphalac suppository this evening in hopes she may receive this soon since she has been under care in the ED for a long time  awaiting a medical bed.  Charlie Pitter III Office:[phone]

## 2023-04-24 NOTE — Procedures (Addendum)
Patient Name: NYAH KAO  MRN: [id number]  Epilepsy Attending: Charlsie Quest  Referring Physician/Provider: John Giovanni, MD  Date: 04/24/2023 Duration: 21.58 mins  Patient history: 89yo M with ams getting eeg to evaluate for seizure  Level of alertness: Awake  AEDs during EEG study: None  Technical aspects: This EEG study was done with scalp electrodes positioned according to the 10-20 International system of electrode placement. Electrical activity was reviewed with band pass filter of 1-70Hz , sensitivity of 7 uV/mm, display speed of 61mm/sec with a 60Hz  notched filter applied as appropriate. EEG data were recorded continuously and digitally stored.  Video monitoring was available and reviewed as appropriate.  Description: The posterior dominant rhythm consists of 7 Hz activity of moderate voltage (25-35 uV) seen predominantly in posterior head regions, symmetric and reactive to eye opening and eye closing. EEG showed continuous generalized 5 to 6 Hz theta slowing. Hyperventilation and photic stimulation were not performed.     ABNORMALITY - Continuous slow, generalized  IMPRESSION: This study is suggestive of mild to moderate diffuse encephalopathy.  No seizures or epileptiform discharges were seen throughout the recording.  Maggie Dworkin Annabelle Harman

## 2023-04-24 NOTE — H&P (Signed)
History and Physical    LINDSEY GOATES KVQ:[id number] DOB: 10-Nov-1925 DOA: 04/23/2023  PCP: Caesar Bookman, NP  Patient coming from: [facility] ED  Chief Complaint: Abnormal labs  HPI: Mikayla [name] is a 89 y.o. female with medical history significant of CAD status post PCI in September 2023, CKD stage IIIb, hypertension, hyperlipidemia, type 2 diabetes, hypothyroidism, chronic HFpEF, chronic anemia.  Patient was recently admitted 11/28-12/2 for proximal left femur fracture secondary to a mechanical fall and underwent surgery.  Also hemoglobin dropping to 6.9 postop and received 1 unit PRBCs with improvement of hemoglobin to 8.9.  Palliative care was following and CODE STATUS was changed to DNR/DNI.  Discharged to SNF.  Patient presented to the ED yesterday from SNF due to concern for abnormal labs and appearing pale.  Also had some episodes where she was staring off into the space.  Patient complained of left flank pain with EMS and family reported constipation.  Afebrile.  CBC notable for WBC count 20.3, hemoglobin 5.7 (baseline 9-10), MCV 94.0.  BMP notable for sodium 128, potassium 6.2, glucose 199, BUN 135, creatinine 1.8 (baseline 1.3-1.5), calcium 8.0.  FOBT and anemia panel pending.  CT head showing chronic heavily calcified meningioma and no acute intracranial abnormality.  CT abdomen pelvis negative for acute findings. Patient was given albuterol 10 g neb treatment, NovoLog 5 units with dextrose, and Lokelma 10 g for hyperkalemia.  She was also given IV Protonix 40 mg and 500 mL normal saline in the ED.  Repeat BMP showing sodium 130, potassium 4.9, bicarb 20, glucose 226, BUN 136, creatinine 1.8, calcium 7.6. Sent from [facility] to [facility] ED due to no bed availability and need for blood transfusions.  On arrival to [facility] ED, 2 units PRBCs ordered.  EDP sent a message to Dr. Leone Payor with GI requesting consultation in the morning.  [facility] called to admit.  Patient is very  confused, oriented to self only.  Not able to give any history.  Daughter at bedside states she was sent here from her nursing facility due to concern for abnormal labs/high potassium on labs.  Also patient has been receiving pain medications since after her recent left femur fracture surgery and has been constipated for the past 12 days but finally did have a bowel movement yesterday after they did fecal disimpaction at her facility.  No bloody stools reported and patient has not vomited blood.  Daughter states patient has also complained of left flank pain and she is concerned that the last time patient had a cardiac stent placed she had complained of left flank pain instead of chest pain.  Daughter is also reporting patient having confusion and staring spells.  Normally she is AAOx4 and does not have history of dementia.  Review of Systems:  Review of Systems  Reason unable to perform ROS: AMS.    Past Medical History:  Diagnosis Date   Chronic rhinitis    CKD (chronic kidney disease), stage III (HCC)    Disorder of bone and cartilage, unspecified    Essential hypertension, benign    Glaucoma    Per [facility] new patient packet   Hearing loss    Per [facility] new patient packet   High blood pressure    Per [facility] new patient packet   High cholesterol    Per [facility] new patient packet   Obesity, unspecified    Other and unspecified hyperlipidemia    Type II or unspecified type diabetes mellitus without  mention of complication, not stated as uncontrolled    Vision loss    Due to catarct Per [facility] new patient packet    Past Surgical History:  Procedure Laterality Date   CESAREAN SECTION N/A    CORONARY/GRAFT ACUTE MI REVASCULARIZATION N/A 01/15/2022   Procedure: Coronary/Graft Acute MI Revascularization;  Surgeon: Yates Decamp, MD;  Location: [facility] INVASIVE CV LAB;  Service: Cardiovascular;  Laterality: N/A;   ESOPHAGOGASTRODUODENOSCOPY N/A 04/29/2014   Procedure: ESOPHAGOGASTRODUODENOSCOPY (EGD);  Surgeon:  Rachael Fee, MD;  Location: [facility];  Service: Endoscopy;  Laterality: N/A;   INTRAMEDULLARY (IM) NAIL INTERTROCHANTERIC Left 04/13/2023   Procedure: OPEN TREATMENT OF LEFT PROXIMAL FEMUR FRACTURE WITH INTERMEDULLARY NAILING;  Surgeon: Samson Frederic, MD;  Location: [facility] OR;  Service: Orthopedics;  Laterality: Left;   LEFT HEART CATH AND CORONARY ANGIOGRAPHY N/A 01/15/2022   Procedure: LEFT HEART CATH AND CORONARY ANGIOGRAPHY;  Surgeon: Yates Decamp, MD;  Location: [facility] INVASIVE CV LAB;  Service: Cardiovascular;  Laterality: N/A;   TOTAL ABDOMINAL HYSTERECTOMY       reports that she has never smoked. She has never used smokeless tobacco. She reports current alcohol use of about 7.0 standard drinks of alcohol per week. She reports that she does not use drugs.  Allergies  Allergen Reactions   Amoxil [Amoxicillin] Rash   Isosorb Dinitrate-Hydralazine Other (See Comments)    Severe dizziness, altered mental status   Lipitor [Atorvastatin] Other (See Comments)    Myalgias    Motrin [Ibuprofen] Other (See Comments)    Told to avoid due to kidney function   Statins Other (See Comments)    Myalgias     Family History  Problem Relation Age of Onset   COPD Mother    Lung cancer Father    Multiple myeloma Son    Prostate cancer Other    Colon cancer Neg Hx    Esophageal cancer Neg Hx    Rectal cancer Neg Hx    Stomach cancer Neg Hx     Prior to Admission medications   Medication Sig Start Date End Date Taking? Authorizing Provider  acetaminophen (TYLENOL) 500 MG tablet Take 500 mg by mouth every 4 (four) hours as needed for mild pain or headache.    [provider]  albuterol (VENTOLIN HFA) 108 (90 Base) MCG/ACT inhaler Inhale 2 puffs into the lungs every 6 (six) hours as needed for wheezing or shortness of breath. 10/21/21   Sharon Seller, NP  amLODipine (NORVASC) 10 MG tablet TAKE 1 TABLET BY MOUTH DAILY 03/12/23   Ngetich, Dinah C, NP  aspirin (ASPIRIN CHILDRENS) 81 MG  chewable tablet Chew 1 tablet (81 mg total) by mouth 2 (two) times daily with a meal. 04/14/23 05/29/23  Hill, Alain Honey, PA-C  bisoprolol (ZEBETA) 5 MG tablet TAKE 1 TABLET BY MOUTH 2 TIMES DAILY. 12/21/22   Sharon Seller, NP  budesonide-formoterol (SYMBICORT) 80-4.5 MCG/ACT inhaler Inhale 2 puffs into the lungs 2 (two) times daily as needed (shortness of breath/wheezing). 12/16/20   Parrett, Virgel Bouquet, NP  clopidogrel (PLAVIX) 75 MG tablet TAKE 1 TABLET (75 MG TOTAL) BY MOUTH DAILY. 01/11/23 01/11/24  Yates Decamp, MD  dorzolamide-timolol (COSOPT) 22.3-6.8 MG/ML ophthalmic solution Place 1 drop into both eyes 2 (two) times daily.  04/18/19   [provider]  ezetimibe (ZETIA) 10 MG tablet TAKE 1 TABLET BY MOUTH DAILY 10/18/22   Yates Decamp, MD  furosemide (LASIX) 40 MG tablet Take 1 tablet (40 mg total) by mouth daily. Take  40mg  by mouth every other day. If shortness of breath, take twice daily for 3 days. 04/16/23   Glade Lloyd, MD  levothyroxine (SYNTHROID) 50 MCG tablet TAKE 1 TABLET BY MOUTH DAILY BEFORE BREAKFAST. 05/10/22   Nyoka Cowden, MD  losartan (COZAAR) 25 MG tablet TAKE 1 TABLET BY MOUTH DAILY 10/10/22   Yates Decamp, MD  metFORMIN (GLUCOPHAGE) 500 MG tablet TAKE 1 TABLET (500 MG TOTAL) BY MOUTH DAILY WITH BREAKFAST. 03/12/23   Ngetich, Dinah C, NP  nitroGLYCERIN (NITROSTAT) 0.4 MG SL tablet Place 1 tablet (0.4 mg total) under the tongue every 5 (five) minutes as needed for chest pain. 01/17/22   Yates Decamp, MD  polyethylene glycol (MIRALAX / GLYCOLAX) 17 g packet Take 17 g by mouth daily as needed for mild constipation. 04/16/23   Glade Lloyd, MD  senna (SENOKOT) 8.6 MG TABS tablet Take 1 tablet (8.6 mg total) by mouth 2 (two) times daily. 04/16/23   Glade Lloyd, MD    Physical Exam: Vitals:   04/23/23 2130 04/23/23 2200 04/23/23 2230 04/23/23 2335  BP: (!) 122/32 (!) 114/29 119/76 (!) 117/34  Pulse: (!) 56 (!) 58 60 (!) 139  Resp: (!) 24 (!) 26 (!) 23 (!) 25  Temp:    97.9 F  (36.6 C)  TempSrc:    Oral  SpO2: 100% 99% 98% 100%  Weight:      Height:        Physical Exam Vitals reviewed.  Constitutional:      General: She is not in acute distress. HENT:     Head: Normocephalic and atraumatic.     Mouth/Throat:     Mouth: Mucous membranes are dry.     Comments: Very dry mucous membranes Eyes:     Extraocular Movements: Extraocular movements intact.  Cardiovascular:     Rate and Rhythm: Normal rate and regular rhythm.     Pulses: Normal pulses.  Pulmonary:     Effort: Pulmonary effort is normal. No respiratory distress.     Breath sounds: Normal breath sounds. No wheezing or rales.  Abdominal:     General: Bowel sounds are normal. There is no distension.     Palpations: Abdomen is soft.     Tenderness: There is no abdominal tenderness. There is no guarding.  Musculoskeletal:     Cervical back: Normal range of motion.     Right lower leg: No edema.     Left lower leg: No edema.  Skin:    General: Skin is warm and dry.  Neurological:     Mental Status: She is alert.     Comments: Very confused, oriented to self only.  However, patient is able to follow commands appropriately.  She is moving all extremities on command, no focal weakness.  No facial droop.     Labs on Admission: I have personally reviewed following labs and imaging studies  CBC: Recent Labs  Lab 04/23/23 1954  WBC 20.3*  HGB 5.7*  HCT 17.3*  MCV 94.0  PLT 329   Basic Metabolic Panel: Recent Labs  Lab 04/23/23 1954 04/23/23 2235  NA 128* 130*  K 6.2* 4.9  CL 97* 100  CO2 23 20*  GLUCOSE 199* 226*  BUN 135* 136*  CREATININE 1.83* 1.84*  CALCIUM 8.0* 7.6*   GFR: Estimated Creatinine Clearance: 13.9 mL/min (A) (by C-G formula based on SCr of 1.84 mg/dL (H)). Liver Function Tests: No results for input(s): "AST", "ALT", "ALKPHOS", "BILITOT", "PROT", "ALBUMIN" in the last 168  hours. No results for input(s): "LIPASE", "AMYLASE" in the last 168 hours. No results for  input(s): "AMMONIA" in the last 168 hours. Coagulation Profile: No results for input(s): "INR", "PROTIME" in the last 168 hours. Cardiac Enzymes: No results for input(s): "CKTOTAL", "CKMB", "CKMBINDEX", "TROPONINI" in the last 168 hours. BNP (last 3 results) Recent Labs    04/25/22 1232  PROBNP 4,724*   HbA1C: No results for input(s): "HGBA1C" in the last 72 hours. CBG: Recent Labs  Lab 04/23/23 2209  GLUCAP 243*   Lipid Profile: No results for input(s): "CHOL", "HDL", "LDLCALC", "TRIG", "CHOLHDL", "LDLDIRECT" in the last 72 hours. Thyroid Function Tests: No results for input(s): "TSH", "T4TOTAL", "FREET4", "T3FREE", "THYROIDAB" in the last 72 hours. Anemia Panel: Recent Labs    04/23/23 2235  FERRITIN 65   Urine analysis:    Component Value Date/Time   COLORURINE YELLOW 03/03/2014 1040   APPEARANCEUR CLEAR 03/03/2014 1040   LABSPEC 1.015 03/03/2014 1040   PHURINE 6.0 03/03/2014 1040   GLUCOSEU NEGATIVE 03/03/2014 1040   HGBUR NEGATIVE 03/03/2014 1040   BILIRUBINUR NEGATIVE 03/03/2014 1040   KETONESUR NEGATIVE 03/03/2014 1040   PROTEINUR NEGATIVE 06/24/2008 1118   UROBILINOGEN 0.2 03/03/2014 1040   NITRITE NEGATIVE 03/03/2014 1040   LEUKOCYTESUR MODERATE (A) 03/03/2014 1040    Radiological Exams on Admission: CT ABDOMEN PELVIS WO CONTRAST  Result Date: 04/23/2023 CLINICAL DATA:  Left-sided flank pain EXAM: CT ABDOMEN AND PELVIS WITHOUT CONTRAST TECHNIQUE: Multidetector CT imaging of the abdomen and pelvis was performed following the standard protocol without IV contrast. RADIATION DOSE REDUCTION: This exam was performed according to the departmental dose-optimization program which includes automated exposure control, adjustment of the mA and/or kV according to patient size and/or use of iterative reconstruction technique. COMPARISON:  06/24/2008 FINDINGS: Lower chest: No acute abnormality. Hepatobiliary: No focal liver abnormality is seen. No gallstones, gallbladder  wall thickening, or biliary dilatation. Pancreas: Unremarkable. No pancreatic ductal dilatation or surrounding inflammatory changes. Spleen: Normal in size without focal abnormality. Adrenals/Urinary Tract: Adrenal glands are within normal limits. Kidneys are well visualized bilaterally. Mild atrophic changes are noted in the left kidney. No renal calculi or obstructive changes are seen. The bladder is partially distended. Stomach/Bowel: Scattered fecal material is noted throughout the colon. No obstructive changes are seen. The appendix is within normal limits. Small bowel and stomach are unremarkable. Vascular/Lymphatic: Aortic atherosclerosis. No enlarged abdominal or pelvic lymph nodes. Reproductive: Status post hysterectomy. No adnexal masses. Other: No abdominal wall hernia or abnormality. No abdominopelvic ascites. Musculoskeletal: Prior operative fixation of a left femoral fracture is seen. Degenerative changes of the lumbar spine are noted. No acute bony abnormality is noted. Chronic L1 compression deformity is seen. IMPRESSION: Chronic changes as described above. No acute abnormality correspond with the given clinical history. Electronically Signed   By: Alcide Clever M.D.   On: 04/23/2023 20:49   CT Head Wo Contrast  Result Date: 04/23/2023 CLINICAL DATA:  Recent memory loss, initial encounter EXAM: CT HEAD WITHOUT CONTRAST TECHNIQUE: Contiguous axial images were obtained from the base of the skull through the vertex without intravenous contrast. RADIATION DOSE REDUCTION: This exam was performed according to the departmental dose-optimization program which includes automated exposure control, adjustment of the mA and/or kV according to patient size and/or use of iterative reconstruction technique. COMPARISON:  01/10/2005 FINDINGS: Brain: Chronic atrophic changes are noted. Dystrophic calcification is noted in the deep white matter on the left. No acute hemorrhage or acute infarction is seen. Heavily  calcified lesion is  noted arising from the right sphenoid wing adjacent to the sella turcica consistent with a meningioma. This measures 2.1 x 2.1 by 1.6 cm in greatest AP, transverse and craniocaudad projections. When compared with the prior sinus CT from 2006 the lesion was present although incompletely evaluated on that limited exam. Vascular: No hyperdense vessel or unexpected calcification. Skull: Normal. Negative for fracture or focal lesion. Sinuses/Orbits: No acute finding. Other: None. IMPRESSION: Heavily calcified meningioma along the medial aspect of the right sphenoid wing adjacent to the sella turcica. This is a chronic finding as it was present in 2006 although incompletely evaluated. Mild atrophic changes without acute abnormality. Electronically Signed   By: Alcide Clever M.D.   On: 04/23/2023 20:46    EKG: Independently reviewed.  Sinus rhythm with first-degree AV block, no significant change since previous EKG.  Assessment and Plan  Acute on chronic normocytic anemia Hemoglobin down to 5.7 without obvious source of bleeding.  Baseline hemoglobin appears to be in the 9-10 range.  No GI bleed symptoms reported.  Anemia panel and FOBT pending.  EDP has already sent a message to Dr. Leone Payor with GI requesting consultation in the morning.  Continue IV Protonix 40 mg every 12 hours for now.  2 units PRBCs ordered in the ED.  Follow-up posttransfusion H&H and continue to transfuse if hemoglobin less than 8 given CAD.  Hold home aspirin and Plavix.  AKI on CKD stage IIIb Likely prerenal in etiology from dehydration.  She has very dry mucous membranes.  BUN 136, creatinine 1.8 (baseline 1.3-1.5).  CT showing no renal calculi or obstructive changes.  Continue gentle IV fluid hydration and monitor labs.  Avoid nephrotoxic agents/contrast.  Hold home Lasix and losartan.  Hyperkalemia In the setting of AKI on CKD stage IIIb.  Potassium initially 6.2 and EKG without acute changes.  Patient received  albuterol, insulin, and Lokelma in the ED.  Hyperkalemia has now resolved with potassium 4.9 on repeat labs.  Continue to monitor metabolic panel.  Acute encephalopathy CT head showing chronic heavily calcified meningioma and no acute intracranial abnormality.  No focal neurodeficit on exam.  UA ordered to rule out UTI.  Check TSH, B12, and ammonia level.  EEG ordered.  Mild hyponatremia Continue gentle IV fluid hydration with normal saline and monitor labs.  ?Hypocalcemia Calcium level low on BMP, repeat labs ordered to check albumin to calculate corrected calcium level.  Left flank pain CT abdomen pelvis negative for acute findings; no renal calculi or obstructive changes seen.  Leukocytosis No fever or signs of sepsis.  No obvious infectious source on CT abdomen pelvis.  Ordered chest x-ray and UA.  Check procalcitonin level and lactate.  Blood cultures ordered.  Monitor WBC count.  Chronic HFpEF Last echo done in September 2023 showing EF 55 to 60%, grade 2 diastolic dysfunction, moderate MVR.  No signs of volume overload at this time.  Check BNP.  CAD status post PCI in September 2023 EKG without acute ischemic changes.  Check troponin.  Hold aspirin and Plavix given worsening anemia/possible GI bleed.  Non-insulin-dependent type 2 diabetes Glucose in the 220s.  Last A1c 6.1 on 01/05/2023.  Ordered sensitive sliding scale insulin ACHS.  Hypertension: Hold Lasix and losartan given AKI. Hyperlipidemia Hypothyroidism: Check TSH. Pharmacy med rec pending.  DVT prophylaxis: SCDs Code Status: DNR/DNI (discussed with the patient's daughter) Family Communication: Daughter updated. Level of care: Progressive Care Unit Admission status: It is my clinical opinion that admission to INPATIENT is reasonable and  necessary because of the expectation that this patient will require hospital care that crosses at least 2 midnights to treat this condition based on the medical complexity of the  problems presented.  Given the aforementioned information, the predictability of an adverse outcome is felt to be significant.  John Giovanni MD [facility]  If 7PM-7AM, please contact night-coverage [email or web address]  04/24/2023, 12:15 AM

## 2023-04-24 NOTE — Progress Notes (Signed)
   Follow Up Note  HPI: Please see full H&P done earlier this morning   Briefly 89 year old female, presents from SNF for concern of abnormal labs and some left flank pain.  In the ED, labs notable for hemoglobin of 5.7 with baseline 9-10, BUN 135, with creatinine 1.8, baseline 1.3-1.5.  CT abdomen pelvis negative for acute findings.  2 units of PRBC ordered in the ED. GI consulted for further management.  Patient admitted for further management.  Of note, patient had recent left femur fracture surgery.   Saw patient this morning, was more awake, asking for her daughter.  Denied any abdominal pain, nausea/vomiting, chest pain, fever/chills.  General: NAD, elderly, chronically ill-appearing Cardiovascular: S1, S2 present Respiratory: CTAB Abdomen: Soft, nontender, nondistended, bowel sounds present Musculoskeletal: No bilateral pedal edema noted Skin: Normal Psychiatry: Normal mood   Present on Admission:  Acute on chronic anemia  Hyperkalemia  Hyponatremia   Assessment/plan Likely acute on chronic blood loss anemia Worsening acute postoperative anemia Rule out GI bleed Hemoglobin 5.7 on presentation, with baseline around 9-10, noted elevated BUN greater than 100 S/p 2 units of PRBC Of note, patient is on Plavix for history of CAD status post PCI in 2023, aspirin twice daily, due to recent left femur fracture repair on 04/13/2023 GI consulted, no plans for EGD except if overt GI bleeding occurs, trend H&H closely Monitor CBC closely  AKI Likely prerenal IV fluids Daily BMP  Mild hyperkalemia Repeat BMP in a.m. after hydration  Leukocytosis Possibly reactive, afebrile UA negative BC x 2 pending Chest x-ray unremarkable

## 2023-04-24 NOTE — ED Notes (Signed)
ED TO INPATIENT HANDOFF REPORT  ED Nurse Name and Phone #: Berna Spare [phone]  S Name/Age/Gender Mikayla [name] 89 y.o. female Room/Bed: 008C/008C  Code Status   Code Status: Limited: Do not attempt resuscitation (DNR) -DNR-LIMITED -Do Not Intubate/DNI   Home/SNF/Other Skilled nursing facility Patient oriented to: self, place, time, and situation Is this baseline? Yes   Triage Complete: Triage complete  Chief Complaint Acute anemia [D64.9]  Triage Note of fenatnyl and 4mg  of zofran given by medics for left flank pain. Pain resolved   Allergies Allergies  Allergen Reactions   Amoxil [Amoxicillin] Rash   Isosorb Dinitrate-Hydralazine Other (See Comments)    Severe dizziness, altered mental status   Lipitor [Atorvastatin] Other (See Comments)    Myalgias    Motrin [Ibuprofen] Other (See Comments)    Told to avoid due to kidney function   Statins Other (See Comments)    Myalgias     Level of Care/Admitting Diagnosis ED Disposition     ED Disposition  Admit   Condition  --   Comment  Hospital Area: [facility] [[id number]]  Level of Care: Progressive [102]  Admit to Progressive based on following criteria: Other see comments  Comments: Hemoglobin 5.9, concern for occult GI bleed  May admit patient to [facility] or [facility] if equivalent level of care is available:: No  Interfacility transfer: Yes  Covid Evaluation: Asymptomatic - no recent exposure (last 10 days) testing not required  Diagnosis: Acute anemia [[id number]]  Admitting Physician: Tereasa Coop [[id number]]  Attending Physician: Tereasa Coop [[id number]]  Certification:: I certify this patient will need inpatient services for at least 2 midnights  Expected Medical Readiness: 04/28/2023          B Medical/Surgery History Past Medical History:  Diagnosis Date   Chronic rhinitis    CKD (chronic kidney disease), stage III (HCC)    Disorder of bone and cartilage,  unspecified    Essential hypertension, benign    Glaucoma    Per [facility] new patient packet   Hearing loss    Per [facility] new patient packet   High blood pressure    Per [facility] new patient packet   High cholesterol    Per [facility] new patient packet   Obesity, unspecified    Other and unspecified hyperlipidemia    Type II or unspecified type diabetes mellitus without mention of complication, not stated as uncontrolled    Vision loss    Due to catarct Per [facility] new patient packet   Past Surgical History:  Procedure Laterality Date   CESAREAN SECTION N/A    CORONARY/GRAFT ACUTE MI REVASCULARIZATION N/A 01/15/2022   Procedure: Coronary/Graft Acute MI Revascularization;  Surgeon: Yates Decamp, MD;  Location: [facility];  Service: Cardiovascular;  Laterality: N/A;   ESOPHAGOGASTRODUODENOSCOPY N/A 04/29/2014   Procedure: ESOPHAGOGASTRODUODENOSCOPY (EGD);  Surgeon: Rachael Fee, MD;  Location: [facility];  Service: Endoscopy;  Laterality: N/A;   INTRAMEDULLARY (IM) NAIL INTERTROCHANTERIC Left 04/13/2023   Procedure: OPEN TREATMENT OF LEFT PROXIMAL FEMUR FRACTURE WITH INTERMEDULLARY NAILING;  Surgeon: Samson Frederic, MD;  Location: [facility] OR;  Service: Orthopedics;  Laterality: Left;   LEFT HEART CATH AND CORONARY ANGIOGRAPHY N/A 01/15/2022   Procedure: LEFT HEART CATH AND CORONARY ANGIOGRAPHY;  Surgeon: Yates Decamp, MD;  Location: [facility];  Service: Cardiovascular;  Laterality: N/A;   TOTAL ABDOMINAL HYSTERECTOMY       A IV Location/Drains/Wounds Patient Lines/Drains/Airways Status     Active Line/Drains/Airways  Name Placement date Placement time Site Days   Peripheral IV 20 G Left Antecubital --  --  Antecubital  --   Peripheral IV 04/24/23 20 G Anterior;Proximal;Right Forearm 04/24/23  0304  Forearm  less than 1            Intake/Output Last 24 hours No intake or output data in the 24 hours ending 04/24/23 1619  Labs/Imaging Results for orders placed or performed during  the hospital encounter of 04/23/23 (from the past 48 hour(s))  Basic metabolic panel     Status: Abnormal   Collection Time: 04/23/23  7:54 PM  Result Value Ref Range   Sodium 128 (L) 135 - 145 mmol/L   Potassium 6.2 (H) 3.5 - 5.1 mmol/L   Chloride 97 (L) 98 - 111 mmol/L   CO2 23 22 - 32 mmol/L   Glucose, Bld 199 (H) 70 - 99 mg/dL    Comment: Glucose reference range applies only to samples taken after fasting for at least 8 hours.   BUN 135 (H) 8 - 23 mg/dL    Comment: REPEATED TO VERIFY RESULT CONFIRMED BY AUTOMATED DILUTION    Creatinine, Ser 1.83 (H) 0.44 - 1.00 mg/dL   Calcium 8.0 (L) 8.9 - 10.3 mg/dL   GFR, Estimated 25 (L) >60 mL/min    Comment: (NOTE) Calculated using the CKD-EPI Creatinine Equation (2021)    Anion gap 8 5 - 15    Comment: Performed at [facility], [address] [place], [place] [address]  CBC     Status: Abnormal   Collection Time: 04/23/23  7:54 PM  Result Value Ref Range   WBC 20.3 (H) 4.0 - 10.5 K/uL   RBC 1.84 (L) 3.87 - 5.11 MIL/uL   Hemoglobin 5.7 (LL) 12.0 - 15.0 g/dL    Comment: REPEATED TO VERIFY THIS CRITICAL RESULT HAS VERIFIED AND BEEN CALLED TO LOU MERRICKS TN BY Joneen Boers ON 12 09 2024 AT 2042, AND HAS BEEN READ BACK.     HCT 17.3 (L) 36.0 - 46.0 %   MCV 94.0 80.0 - 100.0 fL   MCH 31.0 26.0 - 34.0 pg   MCHC 32.9 30.0 - 36.0 g/dL   RDW 25.4 27.0 - 62.3 %   Platelets 329 150 - 400 K/uL   nRBC 0.1 0.0 - 0.2 %    Comment: Performed at [facility], [address], [place], [place] [address]  Reticulocytes     Status: Abnormal   Collection Time: 04/23/23  7:54 PM  Result Value Ref Range   Retic Ct Pct 4.2 (H) 0.4 - 3.1 %   RBC. 1.83 (L) 3.87 - 5.11 MIL/uL   Retic Count, Absolute 76.6 19.0 - 186.0 K/uL   Immature Retic Fract 27.6 (H) 2.3 - 15.9 %    Comment: Performed at [facility], [address] [place], [place] [address]  CBG monitoring, ED     Status: Abnormal    Collection Time: 04/23/23 10:09 PM  Result Value Ref Range   Glucose-Capillary 243 (H) 70 - 99 mg/dL    Comment: Glucose reference range applies only to samples taken after fasting for at least 8 hours.  Basic metabolic panel     Status: Abnormal   Collection Time: 04/23/23 10:35 PM  Result Value Ref Range   Sodium 130 (L) 135 - 145 mmol/L   Potassium 4.9 3.5 - 5.1 mmol/L    Comment: CORRECTED RESULTS CALLED TO: Sharlett Iles, RN 2325 04/23/23 EO  Chloride 100 98 - 111 mmol/L   CO2 20 (L) 22 - 32 mmol/L   Glucose, Bld 226 (H) 70 - 99 mg/dL    Comment: Glucose reference range applies only to samples taken after fasting for at least 8 hours.   BUN 136 (H) 8 - 23 mg/dL   Creatinine, Ser 4.09 (H) 0.44 - 1.00 mg/dL   Calcium 7.6 (L) 8.9 - 10.3 mg/dL   GFR, Estimated 25 (L) >60 mL/min    Comment: (NOTE) Calculated using the CKD-EPI Creatinine Equation (2021)    Anion gap 10 5 - 15    Comment: Performed at [facility], [address] [place], [place] [address]  Vitamin B12     Status: None   Collection Time: 04/23/23 10:35 PM  Result Value Ref Range   Vitamin B-12 595 180 - 914 pg/mL    Comment: (NOTE) This assay is not validated for testing neonatal or myeloproliferative syndrome specimens for Vitamin B12 levels. Performed at [facility], [address] [place], [place] [address]   Folate     Status: None   Collection Time: 04/23/23 10:35 PM  Result Value Ref Range   Folate 6.8 >5.9 ng/mL    Comment: Performed at [facility], [address], [place], [place] [address]  Iron and TIBC     Status: Abnormal   Collection Time: 04/23/23 10:35 PM  Result Value Ref Range   Iron 78 28 - 170 ug/dL   TIBC 086 (L) 578 - 469 ug/dL   Saturation Ratios 36 (H) 10.4 - 31.8 %   UIBC 142 ug/dL    Comment: Performed at [facility], [address], [place], [place] [address]  Ferritin     Status: None   Collection Time: 04/23/23 10:35 PM  Result Value  Ref Range   Ferritin 65 11 - 307 ng/mL    Comment: Performed at [facility], [address], [place], [place] [address]  Type and screen [facility]     Status: None (Preliminary result)   Collection Time: 04/23/23 10:42 PM  Result Value Ref Range   ABO/RH(D) A NEG    Antibody Screen POS    Sample Expiration 04/26/2023,2359    Antibody Identification ANTI D ANTI C    Unit Number [id number]    Blood Component Type RED CELLS,LR    Unit division 00    Status of Unit ISSUED    Donor AG Type NEGATIVE FOR C ANTIGEN    Transfusion Status OK TO TRANSFUSE    Crossmatch Result COMPATIBLE    Unit Number [id number]    Blood Component Type RED CELLS,LR    Unit division 00    Status of Unit ISSUED    Donor AG Type NEGATIVE FOR C ANTIGEN    Transfusion Status OK TO TRANSFUSE    Crossmatch Result COMPATIBLE   Prepare RBC (crossmatch)     Status: None   Collection Time: 04/23/23 11:59 PM  Result Value Ref Range   Order Confirmation      ORDER PROCESSED BY BLOOD BANK Performed at [facility], [address] [place], [place] [address]   CBG monitoring, ED     Status: Abnormal   Collection Time: 04/24/23  1:14 AM  Result Value Ref Range   Glucose-Capillary 168 (H) 70 - 99 mg/dL    Comment: Glucose reference range applies only to samples taken after fasting for at least 8 hours.  CBC     Status: Abnormal   Collection Time: 04/24/23  2:53 AM  Result Value Ref Range   WBC 18.5 (H) 4.0 - 10.5 K/uL   RBC 1.89 (L) 3.87 - 5.11 MIL/uL   Hemoglobin 5.6 (LL) 12.0 - 15.0 g/dL    Comment: REPEATED TO VERIFY THIS CRITICAL RESULT HAS VERIFIED AND BEEN CALLED TO M. ROBERTS, RN BY JEZREEL LAS IGAN ON 12 10 2024 AT 0318, AND HAS BEEN READ BACK.     HCT 17.6 (L) 36.0 - 46.0 %   MCV 93.1 80.0 - 100.0 fL   MCH 29.6 26.0 - 34.0 pg   MCHC 31.8 30.0 - 36.0 g/dL   RDW 21.3 08.6 - 57.8 %   Platelets 263 150 - 400 K/uL   nRBC 0.0 0.0 - 0.2 %    Comment:  Performed at [facility], [address], [place], [place] [address]  Comprehensive metabolic panel     Status: Abnormal   Collection Time: 04/24/23  2:53 AM  Result Value Ref Range   Sodium 132 (L) 135 - 145 mmol/L   Potassium 5.2 (H) 3.5 - 5.1 mmol/L   Chloride 103 98 - 111 mmol/L   CO2 18 (L) 22 - 32 mmol/L   Glucose, Bld 172 (H) 70 - 99 mg/dL    Comment: Glucose reference range applies only to samples taken after fasting for at least 8 hours.   BUN 133 (H) 8 - 23 mg/dL   Creatinine, Ser 9.52 (H) 0.44 - 1.00 mg/dL   Calcium 7.8 (L) 8.9 - 10.3 mg/dL   Total Protein 4.4 (L) 6.5 - 8.1 g/dL   Albumin 2.1 (L) 3.5 - 5.0 g/dL   AST 15 15 - 41 U/L   ALT 9 0 - 44 U/L   Alkaline Phosphatase 27 (L) 38 - 126 U/L   Total Bilirubin 0.6 <1.2 mg/dL   GFR, Estimated 23 (L) >60 mL/min    Comment: (NOTE) Calculated using the CKD-EPI Creatinine Equation (2021)    Anion gap 11 5 - 15    Comment: Performed at [facility], [address] [place], [place] [address]  Procalcitonin     Status: None   Collection Time: 04/24/23  2:53 AM  Result Value Ref Range   Procalcitonin 0.56 ng/mL    Comment:        Interpretation: PCT > 0.5 ng/mL and <= 2 ng/mL: Systemic infection (sepsis) is possible, but other conditions are known to elevate PCT as well. (NOTE)       Sepsis PCT Algorithm           Lower Respiratory Tract                                      Infection PCT Algorithm    ----------------------------     ----------------------------         PCT < 0.25 ng/mL                PCT < 0.10 ng/mL          Strongly encourage             Strongly discourage   discontinuation of antibiotics    initiation of antibiotics    ----------------------------     -----------------------------       PCT 0.25 - 0.50 ng/mL  PCT 0.10 - 0.25 ng/mL               OR       >80% decrease in PCT            Discourage initiation of                                            antibiotics       Encourage discontinuation           of antibiotics    ----------------------------     -----------------------------         PCT >= 0.50 ng/mL              PCT 0.26 - 0.50 ng/mL                AND       <80% decrease in PCT             Encourage initiation of                                             antibiotics       Encourage continuation           of antibiotics    ----------------------------     -----------------------------        PCT >= 0.50 ng/mL                  PCT > 0.50 ng/mL               AND         increase in PCT                  Strongly encourage                                      initiation of antibiotics    Strongly encourage escalation           of antibiotics                                     -----------------------------                                           PCT <= 0.25 ng/mL                                                 OR                                        > 80% decrease in PCT  Discontinue / Do not initiate                                             antibiotics  Performed at [facility], [address], [place], [place] [address]   Culture, blood (Routine X 2) w Reflex to ID Panel     Status: None (Preliminary result)   Collection Time: 04/24/23  2:53 AM   Specimen: BLOOD  Result Value Ref Range   Specimen Description BLOOD SITE NOT SPECIFIED    Special Requests      BOTTLES DRAWN AEROBIC ONLY Blood Culture adequate volume   Culture      NO GROWTH < 12 HOURS Performed at [facility], [address], [place], [place] [address]    Report Status PENDING   Culture, blood (Routine X 2) w Reflex to ID Panel     Status: None (Preliminary result)   Collection Time: 04/24/23  2:53 AM   Specimen: BLOOD  Result Value Ref Range   Specimen Description BLOOD SITE NOT SPECIFIED    Special Requests      BOTTLES DRAWN AEROBIC ONLY Blood Culture results may not be optimal due to an inadequate  volume of blood received in culture bottles   Culture      NO GROWTH < 12 HOURS Performed at [facility], [address], [place], [place] [address]    Report Status PENDING   Troponin I (High Sensitivity)     Status: None   Collection Time: 04/24/23  2:53 AM  Result Value Ref Range   Troponin I (High Sensitivity) 13 <18 ng/L    Comment: (NOTE) Elevated high sensitivity troponin I (hsTnI) values and significant  changes across serial measurements may suggest ACS but many other  chronic and acute conditions are known to elevate hsTnI results.  Refer to the "Links" section for chest pain algorithms and additional  guidance. Performed at [facility], [address] [place], [place] [address]   TSH     Status: None   Collection Time: 04/24/23  2:53 AM  Result Value Ref Range   TSH 2.256 0.350 - 4.500 uIU/mL    Comment: Performed by a 3rd Generation assay with a functional sensitivity of <=0.01 uIU/mL. Performed at [facility], [address], [place], [place] [address]   Vitamin B12     Status: None   Collection Time: 04/24/23  2:53 AM  Result Value Ref Range   Vitamin B-12 719 180 - 914 pg/mL    Comment: (NOTE) This assay is not validated for testing neonatal or myeloproliferative syndrome specimens for Vitamin B12 levels. Performed at [facility], [address] [place], [place] [address]   Brain natriuretic peptide     Status: Abnormal   Collection Time: 04/24/23  2:53 AM  Result Value Ref Range   B Natriuretic Peptide 193.6 (H) 0.0 - 100.0 pg/mL    Comment: Performed at [facility], [address], [place], [place] [address]  Urinalysis, Routine w reflex microscopic -Urine, Clean Catch     Status: None   Collection Time: 04/24/23  5:53 AM  Result Value Ref Range   Color, Urine YELLOW YELLOW   APPearance CLEAR CLEAR   Specific Gravity, Urine 1.012 1.005 - 1.030   pH 5.0 5.0 - 8.0  Glucose, UA NEGATIVE NEGATIVE mg/dL   Hgb urine  dipstick NEGATIVE NEGATIVE   Bilirubin Urine NEGATIVE NEGATIVE   Ketones, ur NEGATIVE NEGATIVE mg/dL   Protein, ur NEGATIVE NEGATIVE mg/dL   Nitrite NEGATIVE NEGATIVE   Leukocytes,Ua NEGATIVE NEGATIVE    Comment: Performed at [facility], [address] [place], [place] [address]  CBG monitoring, ED     Status: Abnormal   Collection Time: 04/24/23  8:41 AM  Result Value Ref Range   Glucose-Capillary 116 (H) 70 - 99 mg/dL    Comment: Glucose reference range applies only to samples taken after fasting for at least 8 hours.  CBG monitoring, ED     Status: Abnormal   Collection Time: 04/24/23  1:28 PM  Result Value Ref Range   Glucose-Capillary 108 (H) 70 - 99 mg/dL    Comment: Glucose reference range applies only to samples taken after fasting for at least 8 hours.   EEG adult  Result Date: 04/24/2023 Charlsie Quest, MD     04/24/2023  1:19 PM Patient Name: Mikayla [name] MRN: [id number] Epilepsy Attending: Charlsie Quest Referring Physician/Provider: John Giovanni, MD Date: 04/24/2023 Duration: 21.58 mins Patient history: 89yo M with ams getting eeg to evaluate for seizure Level of alertness: Awake AEDs during EEG study: None Technical aspects: This EEG study was done with scalp electrodes positioned according to the 10-20 International system of electrode placement. Electrical activity was reviewed with band pass filter of 1-70Hz , sensitivity of 7 uV/mm, display speed of 57mm/sec with a 60Hz  notched filter applied as appropriate. EEG data were recorded continuously and digitally stored.  Video monitoring was available and reviewed as appropriate. Description: The posterior dominant rhythm consists of 7 Hz activity of moderate voltage (25-35 uV) seen predominantly in posterior head regions, symmetric and reactive to eye opening and eye closing. EEG showed continuous generalized 5 to 6 Hz theta slowing. Hyperventilation and photic stimulation were not performed.   ABNORMALITY  - Continuous slow, generalized IMPRESSION: This study is suggestive of mild to moderate diffuse encephalopathy. No seizures or epileptiform discharges were seen throughout the recording. Charlsie Quest   DG CHEST PORT 1 VIEW  Result Date: 04/24/2023 CLINICAL DATA:  Shortness of breath EXAM: PORTABLE CHEST 1 VIEW COMPARISON:  04/12/2023 FINDINGS: Cardiac shadow is within normal limits. Aortic calcifications are seen. Lungs are well aerated bilaterally. No focal infiltrate is seen. No bony abnormality is noted. IMPRESSION: No active disease. Electronically Signed   By: Alcide Clever M.D.   On: 04/24/2023 02:38   CT ABDOMEN PELVIS WO CONTRAST  Result Date: 04/23/2023 CLINICAL DATA:  Left-sided flank pain EXAM: CT ABDOMEN AND PELVIS WITHOUT CONTRAST TECHNIQUE: Multidetector CT imaging of the abdomen and pelvis was performed following the standard protocol without IV contrast. RADIATION DOSE REDUCTION: This exam was performed according to the departmental dose-optimization program which includes automated exposure control, adjustment of the mA and/or kV according to patient size and/or use of iterative reconstruction technique. COMPARISON:  06/24/2008 FINDINGS: Lower chest: No acute abnormality. Hepatobiliary: No focal liver abnormality is seen. No gallstones, gallbladder wall thickening, or biliary dilatation. Pancreas: Unremarkable. No pancreatic ductal dilatation or surrounding inflammatory changes. Spleen: Normal in size without focal abnormality. Adrenals/Urinary Tract: Adrenal glands are within normal limits. Kidneys are well visualized bilaterally. Mild atrophic changes are noted in the left kidney. No renal calculi or obstructive changes are seen. The bladder is partially distended. Stomach/Bowel: Scattered fecal material is noted throughout the colon. No obstructive changes  are seen. The appendix is within normal limits. Small bowel and stomach are unremarkable. Vascular/Lymphatic: Aortic  atherosclerosis. No enlarged abdominal or pelvic lymph nodes. Reproductive: Status post hysterectomy. No adnexal masses. Other: No abdominal wall hernia or abnormality. No abdominopelvic ascites. Musculoskeletal: Prior operative fixation of a left femoral fracture is seen. Degenerative changes of the lumbar spine are noted. No acute bony abnormality is noted. Chronic L1 compression deformity is seen. IMPRESSION: Chronic changes as described above. No acute abnormality correspond with the given clinical history. Electronically Signed   By: Alcide Clever M.D.   On: 04/23/2023 20:49   CT Head Wo Contrast  Result Date: 04/23/2023 CLINICAL DATA:  Recent memory loss, initial encounter EXAM: CT HEAD WITHOUT CONTRAST TECHNIQUE: Contiguous axial images were obtained from the base of the skull through the vertex without intravenous contrast. RADIATION DOSE REDUCTION: This exam was performed according to the departmental dose-optimization program which includes automated exposure control, adjustment of the mA and/or kV according to patient size and/or use of iterative reconstruction technique. COMPARISON:  01/10/2005 FINDINGS: Brain: Chronic atrophic changes are noted. Dystrophic calcification is noted in the deep white matter on the left. No acute hemorrhage or acute infarction is seen. Heavily calcified lesion is noted arising from the right sphenoid wing adjacent to the sella turcica consistent with a meningioma. This measures 2.1 x 2.1 by 1.6 cm in greatest AP, transverse and craniocaudad projections. When compared with the prior sinus CT from 2006 the lesion was present although incompletely evaluated on that limited exam. Vascular: No hyperdense vessel or unexpected calcification. Skull: Normal. Negative for fracture or focal lesion. Sinuses/Orbits: No acute finding. Other: None. IMPRESSION: Heavily calcified meningioma along the medial aspect of the right sphenoid wing adjacent to the sella turcica. This is a chronic  finding as it was present in 2006 although incompletely evaluated. Mild atrophic changes without acute abnormality. Electronically Signed   By: Alcide Clever M.D.   On: 04/23/2023 20:46    Pending Labs Unresulted Labs (From admission, onward)     Start     Ordered   04/25/23 0500  CBC with Differential/Platelet  Tomorrow morning,   R        04/24/23 1048   04/25/23 0500  Basic metabolic panel  Tomorrow morning,   R        04/24/23 1612   04/24/23 1612  Hemoglobin and hematocrit, blood  Once,   R       Comments: Call MD for hgb less than 7    04/24/23 1612   04/24/23 0211  Ammonia  ONCE - STAT,   STAT        04/24/23 0210   04/24/23 0208  Lactic acid, plasma  (Lactic Acid)  ONCE - STAT,   STAT        04/24/23 0209   04/23/23 2153  Occult blood card to lab, stool  Once,   R        04/23/23 2152            Vitals/Pain Today's Vitals   04/24/23 0130 04/24/23 0531 04/24/23 0711 04/24/23 0730  BP: (!) 114/33 (!) 121/35 (!) 144/36 (!) 148/35  Pulse: (!) 129 60 62 (!) 57  Resp: 20 18 19 19   Temp:  98.9 F (37.2 C) 98.7 F (37.1 C) 98.7 F (37.1 C)  TempSrc:  Axillary Oral Oral  SpO2: 96%  100% 100%  Weight:      Height:      PainSc:  2      Isolation Precautions No active isolations  Medications Medications  acetaminophen (TYLENOL) tablet 650 mg (has no administration in time range)    Or  acetaminophen (TYLENOL) suppository 650 mg (has no administration in time range)  insulin aspart (novoLOG) injection 0-9 Units ( Subcutaneous Not Given 04/24/23 1334)  insulin aspart (novoLOG) injection 0-5 Units (has no administration in time range)  pantoprazole (PROTONIX) injection 40 mg (40 mg Intravenous Given 04/24/23 1057)  0.9 %  sodium chloride infusion ( Intravenous New Bag/Given 04/24/23 0742)  polyethylene glycol (MIRALAX / GLYCOLAX) packet 17 g (has no administration in time range)  bisacodyl (DULCOLAX) suppository 10 mg (has no administration in time range)  sodium  chloride 0.9 % bolus 500 mL (0 mLs Intravenous Stopped 04/23/23 2348)  pantoprazole (PROTONIX) injection 40 mg (40 mg Intravenous Given 04/23/23 2055)  sodium zirconium cyclosilicate (LOKELMA) packet 10 g (10 g Oral Given 04/23/23 2127)  albuterol (PROVENTIL) (2.5 MG/3ML) 0.083% nebulizer solution 10 mg (10 mg Nebulization Given 04/23/23 2124)  insulin aspart (novoLOG) injection 5 Units (5 Units Intravenous Given 04/23/23 2129)    And  dextrose 50 % solution 50 mL (50 mLs Intravenous Given 04/23/23 2126)  0.9 %  sodium chloride infusion (Manually program via Guardrails IV Fluids) (250 mLs Intravenous Bolus 04/24/23 0023)    Mobility walks with device     Focused Assessments     R Recommendations: See Admitting Provider Note  Report given to:   Additional Notes: Please call or message with any questions.

## 2023-04-24 NOTE — ED Notes (Signed)
Blood bank will call when units ready/arrive.

## 2023-04-24 NOTE — ED Notes (Signed)
Patient repositioned in the bed, warm drinking water provided per patient request, cream for heels, and pt declined using a BSC or a bedpan for toileting, states she wants to urinate in her brief.

## 2023-04-24 NOTE — Progress Notes (Signed)
EEG complete - results pending 

## 2023-04-25 ENCOUNTER — Inpatient Hospital Stay (HOSPITAL_COMMUNITY): Payer: Medicare Other

## 2023-04-25 DIAGNOSIS — M7989 Other specified soft tissue disorders: Secondary | ICD-10-CM | POA: Diagnosis not present

## 2023-04-25 DIAGNOSIS — K5900 Constipation, unspecified: Secondary | ICD-10-CM

## 2023-04-25 DIAGNOSIS — D649 Anemia, unspecified: Secondary | ICD-10-CM | POA: Diagnosis not present

## 2023-04-25 DIAGNOSIS — E875 Hyperkalemia: Secondary | ICD-10-CM

## 2023-04-25 DIAGNOSIS — Z7902 Long term (current) use of antithrombotics/antiplatelets: Secondary | ICD-10-CM

## 2023-04-25 LAB — CBC WITH DIFFERENTIAL/PLATELET
Abs Immature Granulocytes: 0.25 10*3/uL — ABNORMAL HIGH (ref 0.00–0.07)
Basophils Absolute: 0 10*3/uL (ref 0.0–0.1)
Basophils Relative: 0 %
Eosinophils Absolute: 0.2 10*3/uL (ref 0.0–0.5)
Eosinophils Relative: 1 %
HCT: 23.3 % — ABNORMAL LOW (ref 36.0–46.0)
Hemoglobin: 7.6 g/dL — ABNORMAL LOW (ref 12.0–15.0)
Immature Granulocytes: 2 %
Lymphocytes Relative: 15 %
Lymphs Abs: 2.1 10*3/uL (ref 0.7–4.0)
MCH: 29.5 pg (ref 26.0–34.0)
MCHC: 32.6 g/dL (ref 30.0–36.0)
MCV: 90.3 fL (ref 80.0–100.0)
Monocytes Absolute: 1 10*3/uL (ref 0.1–1.0)
Monocytes Relative: 7 %
Neutro Abs: 10.4 10*3/uL — ABNORMAL HIGH (ref 1.7–7.7)
Neutrophils Relative %: 75 %
Platelets: 237 10*3/uL (ref 150–400)
RBC: 2.58 MIL/uL — ABNORMAL LOW (ref 3.87–5.11)
RDW: 15.3 % (ref 11.5–15.5)
WBC: 14 10*3/uL — ABNORMAL HIGH (ref 4.0–10.5)
nRBC: 0.4 % — ABNORMAL HIGH (ref 0.0–0.2)

## 2023-04-25 LAB — BASIC METABOLIC PANEL
Anion gap: 7 (ref 5–15)
BUN: 118 mg/dL — ABNORMAL HIGH (ref 8–23)
CO2: 19 mmol/L — ABNORMAL LOW (ref 22–32)
Calcium: 7.9 mg/dL — ABNORMAL LOW (ref 8.9–10.3)
Chloride: 108 mmol/L (ref 98–111)
Creatinine, Ser: 1.88 mg/dL — ABNORMAL HIGH (ref 0.44–1.00)
GFR, Estimated: 24 mL/min — ABNORMAL LOW (ref >60)
Glucose, Bld: 114 mg/dL — ABNORMAL HIGH (ref 70–99)
Potassium: 4.6 mmol/L (ref 3.5–5.1)
Sodium: 134 mmol/L — ABNORMAL LOW (ref 135–145)

## 2023-04-25 LAB — GLUCOSE, CAPILLARY
Glucose-Capillary: 88 mg/dL (ref 70–99)
Glucose-Capillary: 113 mg/dL — ABNORMAL HIGH (ref 70–99)
Glucose-Capillary: 115 mg/dL — ABNORMAL HIGH (ref 70–99)
Glucose-Capillary: 122 mg/dL — ABNORMAL HIGH (ref 70–99)

## 2023-04-25 MED ORDER — ONDANSETRON HCL 4 MG/2ML IJ SOLN
4.0000 mg | Freq: Four times a day (QID) | INTRAMUSCULAR | Status: DC | PRN
  Administered 2023-04-25 – 2023-04-28 (×2): 4 mg via INTRAVENOUS
  Filled 2023-04-25 (×2): qty 2

## 2023-04-25 MED ORDER — GLUCERNA SHAKE PO LIQD
237.0000 mL | Freq: Three times a day (TID) | ORAL | Status: DC
  Administered 2023-04-25 – 2023-04-26 (×3): 237 mL via ORAL

## 2023-04-25 MED ORDER — AMLODIPINE BESYLATE 5 MG PO TABS
5.0000 mg | ORAL_TABLET | Freq: Every day | ORAL | Status: DC
  Administered 2023-04-25 – 2023-04-27 (×3): 5 mg via ORAL
  Filled 2023-04-25 (×3): qty 1

## 2023-04-25 NOTE — Progress Notes (Addendum)
Patient ID: Mikayla [name], female   DOB: 02-May-1926, 89 y.o.   MRN: [id number]    Progress Note   Subjective  Day # 2 CC; abnormal labs/normocytic anemia in setting of chronic Plavix and aspirin, and very recent hip fracture with surgical repair  Patient resting, generally uncomfortable secondary to hip and back discomfort.  No complaints of abdominal pain, she still has not had a bowel movement but has started passing gas, says she is not hungry would like Glucerna   She is not confused, understands well when spoken to very closely into her ear.  Stool for occult blood still pending Labs today-WBC 14,000/hemoglobin 7.6/hematocrit 23.3-stable since transfusion x 2 yesterday Sodium 134/potassium 4.6/BUN 118/creatinine 1.88  EEG was done yesterday-generalized slowing suggestive of mild to moderate encephalopathy no seizures Lower extremity Doppler done today, pending    Objective   Vital signs in last 24 hours: Temp:  [96.6 F (35.9 C)-98 F (36.7 C)] 96.6 F (35.9 C) (12/11 0848) Pulse Rate:  [52-64] 60 (12/11 0752) Resp:  [13-22] 16 (12/11 0752) BP: (116-162)/(27-48) 159/42 (12/11 0752) SpO2:  [99 %-100 %] 99 % (12/11 0400)   General: Very elderly   white female in NAD present, hard of hearing Heart:  Regular rate and rhythm; no murmurs Lungs: Respirations even and unlabored, lungs CTA bilaterally Abdomen:  Soft, nontender and nondistended. Normal bowel sounds. Extremities: Left lower extremity with long dressing along the lateral thigh Neurologic:  Alert and oriented,  grossly normal neurologically. Psych:  Cooperative. Normal mood and affect.  Intake/Output from previous day: 12/10 0701 - 12/11 0700 In: -  Out: 650 [Urine:650] Intake/Output this shift: No intake/output data recorded.  Lab Results: Recent Labs    04/23/23 1954 04/24/23 0253 04/24/23 1733 04/24/23 2253 04/25/23 0424  WBC 20.3* 18.5*  --   --  14.0*  HGB 5.7* 5.6* 8.2* 7.8* 7.6*  HCT 17.3*  17.6* 24.3* 22.9* 23.3*  PLT 329 263  --   --  237   BMET Recent Labs    04/23/23 2235 04/24/23 0253 04/25/23 0424  NA 130* 132* 134*  K 4.9 5.2* 4.6  CL 100 103 108  CO2 20* 18* 19*  GLUCOSE 226* 172* 114*  BUN 136* 133* 118*  CREATININE 1.84* 1.98* 1.88*  CALCIUM 7.6* 7.8* 7.9*   LFT Recent Labs    04/24/23 0253  PROT 4.4*  ALBUMIN 2.1*  AST 15  ALT 9  ALKPHOS 27*  BILITOT 0.6   PT/INR No results for input(s): "LABPROT", "INR" in the last 72 hours.  Studies/Results: EEG adult  Result Date: 04/24/2023 Charlsie Quest, MD     04/24/2023  1:19 PM Patient Name: AKYA RITTINGER MRN: [id number] Epilepsy Attending: Charlsie Quest Referring Physician/Provider: John Giovanni, MD Date: 04/24/2023 Duration: 21.58 mins Patient history: 89yo M with ams getting eeg to evaluate for seizure Level of alertness: Awake AEDs during EEG study: None Technical aspects: This EEG study was done with scalp electrodes positioned according to the 10-20 International system of electrode placement. Electrical activity was reviewed with band pass filter of 1-70Hz , sensitivity of 7 uV/mm, display speed of 53mm/sec with a 60Hz  notched filter applied as appropriate. EEG data were recorded continuously and digitally stored.  Video monitoring was available and reviewed as appropriate. Description: The posterior dominant rhythm consists of 7 Hz activity of moderate voltage (25-35 uV) seen predominantly in posterior head regions, symmetric and reactive to eye opening and eye closing. EEG showed continuous generalized 5 to  6 Hz theta slowing. Hyperventilation and photic stimulation were not performed.   ABNORMALITY - Continuous slow, generalized IMPRESSION: This study is suggestive of mild to moderate diffuse encephalopathy. No seizures or epileptiform discharges were seen throughout the recording. Charlsie Quest   DG CHEST PORT 1 VIEW  Result Date: 04/24/2023 CLINICAL DATA:  Shortness of breath  EXAM: PORTABLE CHEST 1 VIEW COMPARISON:  04/12/2023 FINDINGS: Cardiac shadow is within normal limits. Aortic calcifications are seen. Lungs are well aerated bilaterally. No focal infiltrate is seen. No bony abnormality is noted. IMPRESSION: No active disease. Electronically Signed   By: Alcide Clever M.D.   On: 04/24/2023 02:38   CT ABDOMEN PELVIS WO CONTRAST  Result Date: 04/23/2023 CLINICAL DATA:  Left-sided flank pain EXAM: CT ABDOMEN AND PELVIS WITHOUT CONTRAST TECHNIQUE: Multidetector CT imaging of the abdomen and pelvis was performed following the standard protocol without IV contrast. RADIATION DOSE REDUCTION: This exam was performed according to the departmental dose-optimization program which includes automated exposure control, adjustment of the mA and/or kV according to patient size and/or use of iterative reconstruction technique. COMPARISON:  06/24/2008 FINDINGS: Lower chest: No acute abnormality. Hepatobiliary: No focal liver abnormality is seen. No gallstones, gallbladder wall thickening, or biliary dilatation. Pancreas: Unremarkable. No pancreatic ductal dilatation or surrounding inflammatory changes. Spleen: Normal in size without focal abnormality. Adrenals/Urinary Tract: Adrenal glands are within normal limits. Kidneys are well visualized bilaterally. Mild atrophic changes are noted in the left kidney. No renal calculi or obstructive changes are seen. The bladder is partially distended. Stomach/Bowel: Scattered fecal material is noted throughout the colon. No obstructive changes are seen. The appendix is within normal limits. Small bowel and stomach are unremarkable. Vascular/Lymphatic: Aortic atherosclerosis. No enlarged abdominal or pelvic lymph nodes. Reproductive: Status post hysterectomy. No adnexal masses. Other: No abdominal wall hernia or abnormality. No abdominopelvic ascites. Musculoskeletal: Prior operative fixation of a left femoral fracture is seen. Degenerative changes of the  lumbar spine are noted. No acute bony abnormality is noted. Chronic L1 compression deformity is seen. IMPRESSION: Chronic changes as described above. No acute abnormality correspond with the given clinical history. Electronically Signed   By: Alcide Clever M.D.   On: 04/23/2023 20:49   CT Head Wo Contrast  Result Date: 04/23/2023 CLINICAL DATA:  Recent memory loss, initial encounter EXAM: CT HEAD WITHOUT CONTRAST TECHNIQUE: Contiguous axial images were obtained from the base of the skull through the vertex without intravenous contrast. RADIATION DOSE REDUCTION: This exam was performed according to the departmental dose-optimization program which includes automated exposure control, adjustment of the mA and/or kV according to patient size and/or use of iterative reconstruction technique. COMPARISON:  01/10/2005 FINDINGS: Brain: Chronic atrophic changes are noted. Dystrophic calcification is noted in the deep white matter on the left. No acute hemorrhage or acute infarction is seen. Heavily calcified lesion is noted arising from the right sphenoid wing adjacent to the sella turcica consistent with a meningioma. This measures 2.1 x 2.1 by 1.6 cm in greatest AP, transverse and craniocaudad projections. When compared with the prior sinus CT from 2006 the lesion was present although incompletely evaluated on that limited exam. Vascular: No hyperdense vessel or unexpected calcification. Skull: Normal. Negative for fracture or focal lesion. Sinuses/Orbits: No acute finding. Other: None. IMPRESSION: Heavily calcified meningioma along the medial aspect of the right sphenoid wing adjacent to the sella turcica. This is a chronic finding as it was present in 2006 although incompletely evaluated. Mild atrophic changes without acute abnormality. Electronically Signed  By: Alcide Clever M.D.   On: 04/23/2023 20:46       Assessment / Plan:    #66  89 year old female admitted from rehab facility with abnormal labs and some  vague left flank pain-found hemoglobin of 5.7 with a baseline in the 9 range and markedly elevated BUN as well as mild acute kidney injury. CT abdomen pelvis no acute findings Patient has history of chronic anemia, has not had any evidence of overt bleeding, has been constipated over the past few days.  She also had a very recent hip surgery within the past 2 and half weeks and required a transfusion postoperatively.   Suspect her anemia is multifactorial and not secondary to her significant GI bleeding as she was on Plavix and aspirin at the rehab facility and has not had any evidence of melena or hematochezia.  She has voiced that she does not want any procedures.  Endoscopically  #2 acute kidney injury-as above #3 diabetes mellitus #4.  Coronary artery disease status post MI 2023 with PCI-on Plavix and has been on twice daily aspirin since her hip surgery #5 constipation postop fracture  Plan; as tolerates Daily PPI Continue to trend hemoglobin. No plans for endoscopic intervention at this time unless she would manifest active hemorrhage, and need that were to occur with you to discuss.  Patient and family as she has voiced that she does not want endoscopic evaluation. Continue MiraLAX twice daily dulcolax  suppository daily as needed- will give tap water enema today    Principal Problem:   Acute on chronic anemia Active Problems:   Type 2 diabetes mellitus (HCC)   Hyperkalemia   Acute renal failure superimposed on stage 3b chronic kidney disease (HCC)   Hyponatremia   Acute encephalopathy   Chronic heart failure with preserved ejection fraction (HFpEF) (HCC)   CAD (coronary artery disease)     LOS: 2 days   Amy EsterwoodPA-C  04/25/2023, 11:48 AM     I have taken an interval history, thoroughly reviewed the chart and examined the patient. I agree with the Advanced Practitioner's note, impression and recommendations, and have recorded additional findings, impressions and  recommendations below. I performed a substantive portion of this encounter (>50% time spent), including a complete performance of the medical decision making.  My additional thoughts are as follows:  Still no overt GI bleeding, no current plans for endoscopic procedures. Still with markedly elevated BUN out of proportion to creatinine Ongoing constipation, treatment in progress.  Will add a tapwater enema today.   Charlie Pitter III Office:[phone]

## 2023-04-25 NOTE — Progress Notes (Signed)
Bilateral lower extremity venous duplex has been completed. Preliminary results can be found in CV Proc through chart review.   04/25/23 9:27 AM Olen Cordial RVT

## 2023-04-25 NOTE — Progress Notes (Signed)
Tap water enema given. Results = small smear bowel movement.

## 2023-04-25 NOTE — Progress Notes (Signed)
Progress Note   Patient: Mikayla [name]:[id number] DOB: 1925-08-03 DOA: 04/23/2023     2 DOS: the patient was seen and examined on 04/25/2023   Brief hospital course: 89 year old female, presents from SNF for concern of abnormal labs and some left flank pain.  In the ED, labs notable for hemoglobin of 5.7 with baseline 9-10, BUN 135, with creatinine 1.8, baseline 1.3-1.5.  CT abdomen pelvis negative for acute findings.  2 units of PRBC ordered in the ED. GI consulted for further management.  Patient admitted for further management.  Of note, patient had recent left femur fracture surgery.   Assessment and Plan: Acute on chronic normocytic anemia Hemoglobin down to 5.7 without obvious source of bleeding.  Baseline hemoglobin appears to be in the 9-10 range.   -GI is following.  -Per GI , anemia is likely multifactorial and not secondary to significant GI bleed, as pt had no evidence of melena or hematochezia PTA -no plans for endoscopy at this time, which is also per pt preference -would resume ASA and plavix when OK with GI   AKI on CKD stage IIIb Likely prerenal in etiology from dehydration.  Holding home Lasix and losartan. Cr improved with gentle hydration   Hyperkalemia In the setting of AKI on CKD stage IIIb.  Potassium initially 6.2 and EKG without acute changes.  Patient received albuterol, insulin, and Lokelma in the ED.  Hyperkalemia has now resolved    Acute encephalopathy CT head showing chronic heavily calcified meningioma and no acute intracranial abnormality.  No focal neurodeficit on exam.  -Mentation has normalized. Pt conversing very appropriately   Mild hyponatremia Improved with gentle IVF   Hypocalcemia ruled out Corrected Ca of 9.3   Left flank pain CT abdomen pelvis negative for acute findings; no renal calculi or obstructive changes seen.   Leukocytosis No fever or signs of sepsis.  No obvious infectious source on CT abdomen pelvis.  Ordered chest  x-ray and UA. Cont to follow WBC trends   Chronic HFpEF Last echo done in September 2023 showing EF 55 to 60%, grade 2 diastolic dysfunction, moderate MVR.   Appears euvolemic this AM.   CAD status post PCI in September 2023 EKG without acute ischemic changes.  ASA and plavix currently on hold for now. Defer to GI when these can be resumed   Non-insulin-dependent type 2 diabetes Glucose in the 220s.  Last A1c 6.1 on 01/05/2023.  Ordered sensitive sliding scale insulin ACHS  Constipation -Continue with cathartics per GI recs     Subjective: Feeling better today. Still no BM yet  Physical Exam: Vitals:   04/25/23 0431 04/25/23 0752 04/25/23 0848 04/25/23 1248  BP:  (!) 159/42    Pulse:  60    Resp:  16    Temp: 97.9 F (36.6 C)  (!) 96.6 F (35.9 C) 97.8 F (36.6 C)  TempSrc: Oral  Axillary Oral  SpO2:      Weight:      Height:       General exam: Awake, laying in bed, in nad Respiratory system: Normal respiratory effort, no wheezing Cardiovascular system: regular rate, s1, s2 Gastrointestinal system: Soft, nondistended, positive BS Central nervous system: CN2-12 grossly intact, strength intact Extremities: Perfused, no clubbing Skin: Normal skin turgor, no notable skin lesions seen Psychiatry: Mood normal // no visual hallucinations   Data Reviewed:  Labs reviewed: Na 134, K 4.6, Cr 1.88, WBC 14.0, Hgb 7.6, Plts 237  Family Communication: Pt in room, family  not at bedside  Disposition: Status is: Inpatient Remains inpatient appropriate because: Severity of illness  Planned Discharge Destination:  Pending PT/OT eval    Author: Rickey Barbara, MD 04/25/2023 4:40 PM  For on call review [email or web address].

## 2023-04-25 NOTE — Hospital Course (Signed)
89 year old female, presents from SNF for concern of abnormal labs and some left flank pain.  In the ED, labs notable for hemoglobin of 5.7 with baseline 9-10, BUN 135, with creatinine 1.8, baseline 1.3-1.5.  CT abdomen pelvis negative for acute findings.  2 units of PRBC ordered in the ED. GI consulted for further management.  Patient admitted for further management.  Of note, patient had recent left femur fracture surgery.

## 2023-04-25 NOTE — Social Work (Signed)
CSW confirmed with [facility] that pt is from their short term rehab with no barriers to returning at this time. Will follow for PT/OT and medical recommendations for disposition. TOC will continue to monitor patient advancement.

## 2023-04-26 ENCOUNTER — Encounter: Payer: Self-pay | Admitting: Cardiology

## 2023-04-26 DIAGNOSIS — D649 Anemia, unspecified: Secondary | ICD-10-CM | POA: Diagnosis not present

## 2023-04-26 DIAGNOSIS — E875 Hyperkalemia: Secondary | ICD-10-CM | POA: Diagnosis not present

## 2023-04-26 LAB — COMPREHENSIVE METABOLIC PANEL
ALT: 9 U/L (ref 0–44)
AST: 10 U/L — ABNORMAL LOW (ref 15–41)
Albumin: 2 g/dL — ABNORMAL LOW (ref 3.5–5.0)
Alkaline Phosphatase: 26 U/L — ABNORMAL LOW (ref 38–126)
Anion gap: 7 (ref 5–15)
BUN: 121 mg/dL — ABNORMAL HIGH (ref 8–23)
CO2: 19 mmol/L — ABNORMAL LOW (ref 22–32)
Calcium: 7.8 mg/dL — ABNORMAL LOW (ref 8.9–10.3)
Chloride: 111 mmol/L (ref 98–111)
Creatinine, Ser: 1.63 mg/dL — ABNORMAL HIGH (ref 0.44–1.00)
GFR, Estimated: 29 mL/min — ABNORMAL LOW (ref >60)
Glucose, Bld: 138 mg/dL — ABNORMAL HIGH (ref 70–99)
Potassium: 4.4 mmol/L (ref 3.5–5.1)
Sodium: 137 mmol/L (ref 135–145)
Total Bilirubin: 0.6 mg/dL (ref <1.2)
Total Protein: 4.1 g/dL — ABNORMAL LOW (ref 6.5–8.1)

## 2023-04-26 LAB — CBC WITH DIFFERENTIAL/PLATELET
Abs Immature Granulocytes: 0.2 10*3/uL — ABNORMAL HIGH (ref 0.00–0.07)
Basophils Absolute: 0 10*3/uL (ref 0.0–0.1)
Basophils Relative: 0 %
Eosinophils Absolute: 0.2 10*3/uL (ref 0.0–0.5)
Eosinophils Relative: 2 %
HCT: 16.7 % — ABNORMAL LOW (ref 36.0–46.0)
Hemoglobin: 5.4 g/dL — CL (ref 12.0–15.0)
Immature Granulocytes: 2 %
Lymphocytes Relative: 12 %
Lymphs Abs: 1.6 10*3/uL (ref 0.7–4.0)
MCH: 30 pg (ref 26.0–34.0)
MCHC: 32.3 g/dL (ref 30.0–36.0)
MCV: 92.8 fL (ref 80.0–100.0)
Monocytes Absolute: 0.9 10*3/uL (ref 0.1–1.0)
Monocytes Relative: 7 %
Neutro Abs: 9.8 10*3/uL — ABNORMAL HIGH (ref 1.7–7.7)
Neutrophils Relative %: 77 %
Platelets: 247 10*3/uL (ref 150–400)
RBC: 1.8 MIL/uL — ABNORMAL LOW (ref 3.87–5.11)
RDW: 15 % (ref 11.5–15.5)
WBC: 12.8 10*3/uL — ABNORMAL HIGH (ref 4.0–10.5)
nRBC: 0.6 % — ABNORMAL HIGH (ref 0.0–0.2)

## 2023-04-26 LAB — GLUCOSE, CAPILLARY
Glucose-Capillary: 143 mg/dL — ABNORMAL HIGH (ref 70–99)
Glucose-Capillary: 154 mg/dL — ABNORMAL HIGH (ref 70–99)
Glucose-Capillary: 134 mg/dL — ABNORMAL HIGH (ref 70–99)

## 2023-04-26 LAB — HEMOGLOBIN AND HEMATOCRIT, BLOOD
HCT: 28.7 % — ABNORMAL LOW (ref 36.0–46.0)
Hemoglobin: 10 g/dL — ABNORMAL LOW (ref 12.0–15.0)

## 2023-04-26 LAB — PREPARE RBC (CROSSMATCH)

## 2023-04-26 LAB — OCCULT BLOOD X 1 CARD TO LAB, STOOL: Fecal Occult Bld: POSITIVE — AB

## 2023-04-26 MED ORDER — SODIUM CHLORIDE 0.9% IV SOLUTION: Freq: Once | INTRAVENOUS | Status: AC

## 2023-04-26 MED ORDER — CHLORHEXIDINE GLUCONATE CLOTH 2 % EX PADS
6.0000 | MEDICATED_PAD | Freq: Every day | CUTANEOUS | Status: DC
  Administered 2023-04-26 – 2023-04-27 (×2): 6 via TOPICAL

## 2023-04-26 MED ORDER — LACTATED RINGERS IV SOLN: INTRAVENOUS | Status: AC

## 2023-04-26 MED ORDER — SODIUM CHLORIDE 0.9 % IV SOLN: INTRAVENOUS | Status: DC

## 2023-04-26 NOTE — Progress Notes (Signed)
Progress Note   Patient: Mikayla [name]:[id number] DOB: 12-Apr-1926 DOA: 04/23/2023     3 DOS: the patient was seen and examined on 04/26/2023   Brief hospital course: 89 year old female, presents from SNF for concern of abnormal labs and some left flank pain.  In the ED, labs notable for hemoglobin of 5.7 with baseline 9-10, BUN 135, with creatinine 1.8, baseline 1.3-1.5.  CT abdomen pelvis negative for acute findings.  2 units of PRBC ordered in the ED. GI consulted for further management.  Patient admitted for further management.  Of note, patient had recent left femur fracture surgery.   Assessment and Plan: Acute on chronic normocytic anemia Hemoglobin down to 5.7 without obvious source of bleeding.  Baseline hemoglobin appears to be in the 9-10 range.   -GI is following.  -Per GI , anemia is likely multifactorial and not secondary to significant GI bleed, as pt had no evidence of melena or hematochezia PTA -Hgb is now down to 5.4 from 7.6 yesterday. Ordered 2 units PRBC"s - ASA and plavix remain on hold for now -Plan for EGD tomorrow   AKI on CKD stage IIIb Likely prerenal in etiology from dehydration.  Holding home Lasix and losartan. Cr improved with gentle hydration   Hyperkalemia In the setting of AKI on CKD stage IIIb.  Potassium initially 6.2 and EKG without acute changes.  Patient received albuterol, insulin, and Lokelma in the ED.  Hyperkalemia has now resolved    Acute encephalopathy CT head showing chronic heavily calcified meningioma and no acute intracranial abnormality.  No focal neurodeficit on exam.  -Mentation has normalized. Pt conversing very appropriately   Mild hyponatremia Normalized   Hypocalcemia ruled out Ca did correct to normal   Left flank pain CT abdomen pelvis negative for acute findings; no renal calculi or obstructive changes seen.   Leukocytosis No fever or signs of sepsis.  No obvious infectious source on CT abdomen pelvis.  Ordered  chest x-ray and UA. WBC improved   Chronic HFpEF Last echo done in September 2023 showing EF 55 to 60%, grade 2 diastolic dysfunction, moderate MVR.   Appears euvolemic this AM.   CAD status post PCI in September 2023 EKG without acute ischemic changes.  ASA and plavix currently on hold for now. Defer to GI when these can be resumed   Non-insulin-dependent type 2 diabetes Glucose in the 220s.  Last A1c 6.1 on 01/05/2023.  Ordered sensitive sliding scale insulin ACHS  Constipation -Continue with cathartics per GI recs     Subjective: Without complaints this AM  Physical Exam: Vitals:   04/26/23 1430 04/26/23 1454 04/26/23 1500 04/26/23 1541  BP: (!) 144/32 (!) 144/32 (!) 152/32   Pulse: 71 70 70   Resp: 18 15 (!) 23   Temp: 98.3 F (36.8 C) 98.3 F (36.8 C)  97.9 F (36.6 C)  TempSrc: Oral Oral  Oral  SpO2: 100%  100%   Weight:      Height:       General exam: Conversant, in no acute distress Respiratory system: normal chest rise, clear, no audible wheezing Cardiovascular system: regular rhythm, s1-s2 Gastrointestinal system: Nondistended, nontender, pos BS Central nervous system: No seizures, no tremors Extremities: No cyanosis, no joint deformities Skin: No rashes, no pallor Psychiatry: Affect normal // no auditory hallucinations   Data Reviewed:  Labs reviewed: Na 137, K 4.4, Cr 1.63, WBC 12.8, Hgb 5.4, Plts 247  Family Communication: Pt in room, family not at bedside  Disposition:  Status is: Inpatient Remains inpatient appropriate because: Severity of illness  Planned Discharge Destination:  Pending PT/OT eval    Author: Rickey Barbara, MD 04/26/2023 5:29 PM  For on call review [email or web address].

## 2023-04-26 NOTE — Progress Notes (Addendum)
PT Cancellation Note  Patient Details Name: Mikayla [name] MRN: [id number] DOB: 08-03-1925   Cancelled Treatment:    Reason Eval/Treat Not Completed: Other (comment)  Spoke with RN. Pt having labs redrawn for stat Hgb. Will check back later due to concern for low Hgb.   Addendum 914: Hgb returned 5.4  Will hold formal PT evaluation at this time. Please secure chat if evaluation urgent.     Kathlyn Sacramento, PT, DPT [facility]  Rehabilitation Services Physical Therapist Office: [phone] Website: [email or web address]  Berton Mount 04/26/2023, 8:33 AM

## 2023-04-26 NOTE — Progress Notes (Addendum)
Progress Note   Subjective  Day #3 Chief Complaint: Abnormal labs/normocytic anemia in setting of chronic Plavix and aspirin, very recent hip fracture with surgical repair  Unfortunately hemoglobin 7.6--> 5.4 overnight, still no overt signs of GI bleeding.  In fact patient has not had a bowel movement since Monday, 04/23/2023.  Daughter is by the bedside and very concerned about no bowel movement and wondering "where all this blood has gone", she tells me that the patient has gained 8 pounds since being in the hospital via her looking at the bed weight.  Also asked about a UTI.  Also wants it noted that her mother never refused an EGD, she is just hard of hearing.  Patient does wake up at time my interview and I am able to speak with her.  She tells me that ideally she would not want to have an EGD but understands that we are looking for source of bleeding and need to do one now.  We will let her get some blood today and plan for this procedure tomorrow.  Denies abdominal pain, nausea or vomiting.    Objective   Vital signs in last 24 hours: Temp:  [97.5 F (36.4 C)-98.7 F (37.1 C)] 98.7 F (37.1 C) (12/12 1145) Pulse Rate:  [57-78] 69 (12/12 1145) Resp:  [15-21] 20 (12/12 1145) BP: (111-148)/(34-92) 132/39 (12/12 1145) SpO2:  [88 %-99 %] 98 % (12/12 1100) Last BM Date : 04/23/23 (Per physician progress note.) General:    Frail appearing, elderly white female in NAD Heart:  Regular rate and rhythm; no murmurs Lungs: Respirations even and unlabored, lungs CTA bilaterally Abdomen:  Soft, nontender and nondistended. Normal bowel sounds. Psych:  Cooperative. Normal mood and affect.  Intake/Output from previous day: 12/11 0701 - 12/12 0700 In: 360 [P.O.:360] Out: 1400 [Urine:1400]  Lab Results: Recent Labs    04/24/23 0253 04/24/23 1733 04/24/23 2253 04/25/23 0424 04/26/23 0805  WBC 18.5*  --   --  14.0* 12.8*  HGB 5.6*   < > 7.8* 7.6* 5.4*  HCT 17.6*   < > 22.9* 23.3*  16.7*  PLT 263  --   --  237 247   < > = values in this interval not displayed.   BMET Recent Labs    04/24/23 0253 04/25/23 0424 04/26/23 0306  NA 132* 134* 137  K 5.2* 4.6 4.4  CL 103 108 111  CO2 18* 19* 19*  GLUCOSE 172* 114* 138*  BUN 133* 118* 121*  CREATININE 1.98* 1.88* 1.63*  CALCIUM 7.8* 7.9* 7.8*   LFT Recent Labs    04/26/23 0306  PROT 4.1*  ALBUMIN 2.0*  AST 10*  ALT 9  ALKPHOS 26*  BILITOT 0.6   Studies/Results: VAS [place] LOWER EXTREMITY VENOUS (DVT) Result Date: 04/25/2023  Lower Venous DVT Study Patient Name:  CHILD BEDWARD  Date of Exam:   04/25/2023 Medical Rec #: [id number]            Accession #:    [id number] Date of Birth: Jun 28, 1925             Patient Gender: F Patient Age:   89 years Exam Location:  [facility] Procedure:      VAS [place] LOWER EXTREMITY VENOUS (DVT) Referring Phys: [facility] EZENDUKA --------------------------------------------------------------------------------  Indications: Swelling.  Risk Factors: Surgery Trauma. Limitations: Poor ultrasound/tissue interface and patient positioning, patient immobility, patient pain tolerance. Comparison Study: No prior studies. Performing Technologist: Chanda Busing RVT  Examination Guidelines: A  complete evaluation includes B-mode imaging, spectral Doppler, color Doppler, and power Doppler as needed of all accessible portions of each vessel. Bilateral testing is considered an integral part of a complete examination. Limited examinations for reoccurring indications may be performed as noted. The reflux portion of the exam is performed with the patient in reverse Trendelenburg.  +---------+---------------+---------+-----------+----------+--------------+ RIGHT    CompressibilityPhasicitySpontaneityPropertiesThrombus Aging +---------+---------------+---------+-----------+----------+--------------+ CFV      Full           Yes      Yes                                  +---------+---------------+---------+-----------+----------+--------------+ SFJ      Full                                                        +---------+---------------+---------+-----------+----------+--------------+ FV Prox  Full                                                        +---------+---------------+---------+-----------+----------+--------------+ FV Mid   Full                                                        +---------+---------------+---------+-----------+----------+--------------+ FV DistalFull                                                        +---------+---------------+---------+-----------+----------+--------------+ PFV      Full                                                        +---------+---------------+---------+-----------+----------+--------------+ POP      Full           Yes      Yes                                 +---------+---------------+---------+-----------+----------+--------------+ PTV      Full                                                        +---------+---------------+---------+-----------+----------+--------------+ PERO     Full                                                        +---------+---------------+---------+-----------+----------+--------------+   +---------+---------------+---------+-----------+----------+-------------------+  LEFT     CompressibilityPhasicitySpontaneityPropertiesThrombus Aging      +---------+---------------+---------+-----------+----------+-------------------+ CFV      Full           Yes      Yes                                      +---------+---------------+---------+-----------+----------+-------------------+ SFJ      Full                                                             +---------+---------------+---------+-----------+----------+-------------------+ FV Prox  Full                                                              +---------+---------------+---------+-----------+----------+-------------------+ FV Mid   Full                                                             +---------+---------------+---------+-----------+----------+-------------------+ FV DistalFull           Yes      Yes                                      +---------+---------------+---------+-----------+----------+-------------------+ PFV      Full                                                             +---------+---------------+---------+-----------+----------+-------------------+ POP      Full           Yes      Yes                                      +---------+---------------+---------+-----------+----------+-------------------+ PTV                                                   patency shown with                                                        color doppler       +---------+---------------+---------+-----------+----------+-------------------+ PERO  patency shown with                                                        color doppler       +---------+---------------+---------+-----------+----------+-------------------+     Summary: RIGHT: - There is no evidence of deep vein thrombosis in the lower extremity.  - No cystic structure found in the popliteal fossa.  LEFT: - There is no evidence of deep vein thrombosis in the lower extremity. However, portions of this examination were limited- see technologist comments above.  - No cystic structure found in the popliteal fossa.  *See table(s) above for measurements and observations. Electronically signed by Coral Else MD on 04/25/2023 at 9:06:37 PM.    Final        Assessment / Plan:   Assessment: 1.  Acute on chronic anemia: Hemoglobin 7.6--> 5.4 overnight with no overt signs of GI bleeding, has not had a bowel movement since 04/23/2023, BUN also markedly elevated; concern for upper GI  bleed 2.  AKI 3.  Diabetes 4.  CAD status post MI 2023 with PCI-on Plavix and has been on twice daily aspirin since her hip surgery 5.  Constipation  Plan: 1.  Agree with 2 units PRBCs today 2.  Continue monitor hemoglobin and transfusion as needed less than 7 3.  Plan for EGD tomorrow for further evaluation of anemia.  Discussed this in depth with the patient and her daughter today and they both agreed to proceed.  We discussed risks and benefits. 4.  Just constipation today.  Patient has been on Senokot twice a day as well as MiraLAX twice a day for the past 3 days now as well as a recent enema with no real results.  We will work on this tomorrow pending results from EGD.  Thank you for your kind consultation, we will continue to follow along.   LOS: 3 days   Unk Lightning  04/26/2023, 12:27 PM  I have taken an interval history, thoroughly reviewed the chart and examined the patient. I agree with the Advanced Practitioner's note, impression and recommendations, and have recorded additional findings, impressions and recommendations below. I performed a substantive portion of this encounter (>50% time spent), including a complete performance of the medical decision making.  My additional thoughts are as follows:  Declining hemoglobin again and persistently elevated BUN raise concern for upper GI bleed, multiple potential sources in this elderly patient on Plavix and aspirin.  Further PRBC transfusion today, EGD with me tomorrow.  I spoke with her at length with her daughter at the bedside, and they were both agreeable. Risks and benefits reviewed Patient at increased risk for cardiopulmonary complications of procedure due to medical comorbidities.  After upper endoscopy, we will have her take a GoLytely bowel preparation for relief of her obstipation.  I am hopeful she will not actually need a colonoscopy performed if we do identify a bleeding source on the upper exam, but I feel  she still needs relief of this severe constipation.   Charlie Pitter III Office:[phone]

## 2023-04-26 NOTE — Progress Notes (Signed)
OT Cancellation Note  Patient Details Name: Mikayla [name] MRN: [id number] DOB: Oct 19, 1925   Cancelled Treatment:    Reason Eval/Treat Not Completed: Medical issues which prohibited therapy. In speaking with with RN, pt's original Hgb this AM was 5.9 (not in chart) and awaiting results of re-draw. Will hold eval until after we get those results.  Lindon Romp OT Acute Rehabilitation Services Office [phone]    Evette Georges 04/26/2023, 8:33 AM

## 2023-04-26 NOTE — Anesthesia Preprocedure Evaluation (Addendum)
Anesthesia Evaluation  Patient identified by MRN, date of birth, ID band Patient awake    Reviewed: Allergy & Precautions, NPO status , Patient's Chart, lab work & pertinent test results, reviewed documented beta blocker date and time   Airway Mallampati: III  TM Distance: >3 FB Neck ROM: Full  Mouth opening: Limited Mouth Opening  Dental no notable dental hx. (+) Teeth Intact, Dental Advisory Given   Pulmonary asthma    Pulmonary exam normal breath sounds clear to auscultation       Cardiovascular hypertension, Pt. on home beta blockers and Pt. on medications + CAD, + Past MI, + Cardiac Stents and + DOE  Normal cardiovascular exam Rhythm:Regular Rate:Normal   TTE 2023 1. Left ventricular ejection fraction, by estimation, is 55 to 60%. The  left ventricle has normal function. The left ventricle demonstrates  regional wall motion abnormalities (see scoring diagram/findings for  description). There is moderate left  ventricular hypertrophy. Left ventricular diastolic parameters are  consistent with Grade II diastolic dysfunction (pseudonormalization).  Elevated left ventricular end-diastolic pressure. The E/e' is 24.1. There  is moderate hypokinesis of the left  ventricular, basal-mid inferior wall.   2. Right ventricular systolic function is normal. The right ventricular  size is normal. Mildly increased right ventricular wall thickness.  Tricuspid regurgitation signal is inadequate for assessing PA pressure.   3. Left atrial size was mildly dilated.   4. The mitral valve is normal in structure. Moderate mitral valve  regurgitation. No evidence of mitral stenosis.   5. The aortic valve is tricuspid. Aortic valve regurgitation is not  visualized. Aortic valve sclerosis/calcification is present, without any  evidence of aortic stenosis.   6. There is mild (Grade II) plaque.   7. The inferior vena cava is normal in size with  greater than 50%  respiratory variability, suggesting right atrial pressure of 3 mmHg.   Left Heart Catheterization 01/15/22:  LV not performed. LM: Calcified without luminal obstruction. LAD: Mild diffuse disease, focal 40% stenosis in the midsegment, mild to moderate diffuse calcification.  Small D1 and D2 and D3.  Faint collaterals are noted to the distal RCA. CX: Small calibered vessel.  Gives origin to a small caliber but long OM1 and OM 3.  Proximal to mid segment of Cx has a focal calcific 70% stenosis. RCA: Dominant vessel, gives origin to large PL and PDA.  Mildly diffusely calcified.  Occluder of the origin of RV branch. Intervention data: Successful PTCA and stenting of the mid RCA with implantation of a 3.5 x 16 mm Synergy XT DES at 12 atmospheric pressure, stenosis reduced from 100% to 0%, TIMI 0 to TIMI-3 flow.  65 mill contrast utilized     Neuro/Psych negative neurological ROS  negative psych ROS   GI/Hepatic negative GI ROS, Neg liver ROS,,,  Endo/Other  diabetes, Type 2, Oral Hypoglycemic AgentsHypothyroidism    Renal/GU Renal InsufficiencyRenal diseaseLab Results      Component                Value               Date                      NA                       142                 04/27/2023  CL                       115 (H)             04/27/2023                K                        4.6                 04/27/2023                CO2                      19 (L)              04/27/2023                BUN                      117 (H)             04/27/2023                CREATININE               1.57 (H)            04/27/2023                GFRNONAA                 30 (L)              04/27/2023                CALCIUM                  8.0 (L)             04/27/2023                PHOS                     4.9 (H)             04/13/2023                ALBUMIN                  1.9 (L)             04/27/2023                GLUCOSE                  161  (H)             04/27/2023             negative genitourinary   Musculoskeletal negative musculoskeletal ROS (+)    Abdominal   Peds  Hematology  (+) Blood dyscrasia (plavix), anemia Lab Results      Component                Value               Date                      WBC  13.2 (H)            04/27/2023                HGB                      7.5 (L)             04/27/2023                HCT                      21.8 (L)            04/27/2023                MCV                      89.3                04/27/2023                PLT                      217                 04/27/2023              Anesthesia Other Findings 89 year old female, presents from SNF for concern of abnormal labs and some left flank pain.  In the ED, labs notable for hemoglobin of 5.7 with baseline 9-10, BUN 135, with creatinine 1.8, baseline 1.3-1.5.  CT abdomen pelvis negative for acute findings.  2 units of PRBC ordered in the ED. GI consulted for further management   Reproductive/Obstetrics                             Anesthesia Physical Anesthesia Plan  ASA: 3  Anesthesia Plan: MAC   Post-op Pain Management:    Induction: Intravenous  PONV Risk Score and Plan: Propofol infusion and Treatment may vary due to age or medical condition  Airway Management Planned: Natural Airway  Additional Equipment:   Intra-op Plan:   Post-operative Plan:   Informed Consent: I have reviewed the patients History and Physical, chart, labs and discussed the procedure including the risks, benefits and alternatives for the proposed anesthesia with the patient or authorized representative who has indicated his/her understanding and acceptance.   Patient has DNR.  Discussed DNR with patient and Suspend DNR.   Dental advisory given  Plan Discussed with: CRNA  Anesthesia Plan Comments:        Anesthesia Quick Evaluation

## 2023-04-26 NOTE — Plan of Care (Signed)

## 2023-04-27 ENCOUNTER — Inpatient Hospital Stay (HOSPITAL_COMMUNITY): Payer: Medicare Other | Admitting: Anesthesiology

## 2023-04-27 ENCOUNTER — Encounter (HOSPITAL_COMMUNITY): Admission: EM | Disposition: E | Payer: Self-pay | Source: Skilled Nursing Facility | Attending: Internal Medicine

## 2023-04-27 ENCOUNTER — Encounter (HOSPITAL_COMMUNITY): Payer: Self-pay | Admitting: Internal Medicine

## 2023-04-27 DIAGNOSIS — K222 Esophageal obstruction: Secondary | ICD-10-CM

## 2023-04-27 DIAGNOSIS — K921 Melena: Secondary | ICD-10-CM

## 2023-04-27 DIAGNOSIS — E875 Hyperkalemia: Secondary | ICD-10-CM | POA: Diagnosis not present

## 2023-04-27 DIAGNOSIS — D649 Anemia, unspecified: Secondary | ICD-10-CM | POA: Diagnosis not present

## 2023-04-27 DIAGNOSIS — D62 Acute posthemorrhagic anemia: Secondary | ICD-10-CM

## 2023-04-27 DIAGNOSIS — K449 Diaphragmatic hernia without obstruction or gangrene: Secondary | ICD-10-CM

## 2023-04-27 HISTORY — PX: ESOPHAGOGASTRODUODENOSCOPY (EGD) WITH PROPOFOL: SHX5813

## 2023-04-27 LAB — CBC
HCT: 21.8 % — ABNORMAL LOW (ref 36.0–46.0)
Hemoglobin: 7.5 g/dL — ABNORMAL LOW (ref 12.0–15.0)
MCH: 30.7 pg (ref 26.0–34.0)
MCHC: 34.4 g/dL (ref 30.0–36.0)
MCV: 89.3 fL (ref 80.0–100.0)
Platelets: 217 10*3/uL (ref 150–400)
RBC: 2.44 MIL/uL — ABNORMAL LOW (ref 3.87–5.11)
RDW: 14.7 % (ref 11.5–15.5)
WBC: 13.2 10*3/uL — ABNORMAL HIGH (ref 4.0–10.5)
nRBC: 0.4 % — ABNORMAL HIGH (ref 0.0–0.2)

## 2023-04-27 LAB — COMPREHENSIVE METABOLIC PANEL
ALT: 7 U/L (ref 0–44)
AST: 9 U/L — ABNORMAL LOW (ref 15–41)
Albumin: 1.9 g/dL — ABNORMAL LOW (ref 3.5–5.0)
Alkaline Phosphatase: 25 U/L — ABNORMAL LOW (ref 38–126)
Anion gap: 8 (ref 5–15)
BUN: 117 mg/dL — ABNORMAL HIGH (ref 8–23)
CO2: 19 mmol/L — ABNORMAL LOW (ref 22–32)
Calcium: 8 mg/dL — ABNORMAL LOW (ref 8.9–10.3)
Chloride: 115 mmol/L — ABNORMAL HIGH (ref 98–111)
Creatinine, Ser: 1.57 mg/dL — ABNORMAL HIGH (ref 0.44–1.00)
GFR, Estimated: 30 mL/min — ABNORMAL LOW (ref >60)
Glucose, Bld: 161 mg/dL — ABNORMAL HIGH (ref 70–99)
Potassium: 4.6 mmol/L (ref 3.5–5.1)
Sodium: 142 mmol/L (ref 135–145)
Total Bilirubin: 0.7 mg/dL (ref <1.2)
Total Protein: 3.9 g/dL — ABNORMAL LOW (ref 6.5–8.1)

## 2023-04-27 LAB — TYPE AND SCREEN
ABO/RH(D): A NEG
Antibody Screen: POSITIVE
Donor AG Type: NEGATIVE
Donor AG Type: NEGATIVE
Donor AG Type: NEGATIVE
Donor AG Type: NEGATIVE
Unit division: 0
Unit division: 0
Unit division: 0
Unit division: 0

## 2023-04-27 LAB — BPAM RBC
Blood Product Expiration Date: 202412302359
Blood Product Expiration Date: 202412312359
Blood Product Expiration Date: 202501012359
Blood Product Expiration Date: 202501112359
ISSUE DATE / TIME: 202412100111
ISSUE DATE / TIME: 202412100523
ISSUE DATE / TIME: 202412121124
ISSUE DATE / TIME: 202412121429
Unit Type and Rh: 600
Unit Type and Rh: 600
Unit Type and Rh: 600
Unit Type and Rh: 600

## 2023-04-27 LAB — GLUCOSE, CAPILLARY
Glucose-Capillary: 129 mg/dL — ABNORMAL HIGH (ref 70–99)
Glucose-Capillary: 152 mg/dL — ABNORMAL HIGH (ref 70–99)
Glucose-Capillary: 148 mg/dL — ABNORMAL HIGH (ref 70–99)
Glucose-Capillary: 144 mg/dL — ABNORMAL HIGH (ref 70–99)
Glucose-Capillary: 125 mg/dL — ABNORMAL HIGH (ref 70–99)

## 2023-04-27 LAB — HEMOGLOBIN AND HEMATOCRIT, BLOOD
HCT: 22.3 % — ABNORMAL LOW (ref 36.0–46.0)
Hemoglobin: 7.5 g/dL — ABNORMAL LOW (ref 12.0–15.0)

## 2023-04-27 SURGERY — ESOPHAGOGASTRODUODENOSCOPY (EGD) WITH PROPOFOL
Anesthesia: Monitor Anesthesia Care

## 2023-04-27 MED ORDER — PEG-KCL-NACL-NASULF-NA ASC-C 100 G PO SOLR
0.5000 | Freq: Once | ORAL | Status: AC
  Administered 2023-04-27: 100 g via ORAL
  Filled 2023-04-27 (×2): qty 1

## 2023-04-27 MED ORDER — PROPOFOL 10 MG/ML IV BOLUS
INTRAVENOUS | Status: DC | PRN
  Administered 2023-04-27: 75 ug/kg/min via INTRAVENOUS

## 2023-04-27 MED ORDER — PEG-KCL-NACL-NASULF-NA ASC-C 100 G PO SOLR
0.5000 | Freq: Once | ORAL | Status: AC
  Administered 2023-04-28: 0.5 via ORAL
  Filled 2023-04-27: qty 1

## 2023-04-27 MED ORDER — LIDOCAINE 2% (20 MG/ML) 5 ML SYRINGE
INTRAMUSCULAR | Status: DC | PRN
  Administered 2023-04-27: 20 mg via INTRAVENOUS

## 2023-04-27 MED ORDER — PEG-KCL-NACL-NASULF-NA ASC-C 100 G PO SOLR: 1.0000 | Freq: Once | ORAL | Status: DC

## 2023-04-27 MED ORDER — PHENYLEPHRINE 80 MCG/ML (10ML) SYRINGE FOR IV PUSH (FOR BLOOD PRESSURE SUPPORT)
PREFILLED_SYRINGE | INTRAVENOUS | Status: DC | PRN
  Administered 2023-04-27: 80 ug via INTRAVENOUS

## 2023-04-27 MED ORDER — GLUCERNA SHAKE PO LIQD: 237.0000 mL | Freq: Three times a day (TID) | ORAL | Status: DC

## 2023-04-27 MED ORDER — SACCHAROMYCES BOULARDII 250 MG PO CAPS
250.0000 mg | ORAL_CAPSULE | Freq: Two times a day (BID) | ORAL | Status: DC
  Administered 2023-04-27 (×2): 250 mg via ORAL
  Filled 2023-04-27 (×5): qty 1

## 2023-04-27 MED ORDER — SODIUM CHLORIDE 0.9 % IV SOLN: INTRAVENOUS | Status: DC | PRN

## 2023-04-27 SURGICAL SUPPLY — 14 items

## 2023-04-27 NOTE — Transfer of Care (Signed)
Immediate Anesthesia Transfer of Care Note  Patient: Mikayla [name]  Procedure(s) Performed: ESOPHAGOGASTRODUODENOSCOPY (EGD) WITH PROPOFOL  Patient Location: Endoscopy Unit  Anesthesia Type:MAC  Level of Consciousness: awake and alert   Airway & Oxygen Therapy: Patient Spontanous Breathing  Post-op Assessment: Report given to RN and Post -op Vital signs reviewed and stable  Post vital signs: Reviewed and stable  Last Vitals:  Vitals Value Taken Time  BP 140/42 04/27/23 0902  Temp 36.3 C 04/27/23 0902  Pulse 75 04/27/23 0902  Resp 22 04/27/23 0902  SpO2 99 % 04/27/23 0902    Last Pain:  Vitals:   04/27/23 0902  TempSrc: Temporal  PainSc: 0-No pain      Patients Stated Pain Goal: 0 (04/26/23 1500)  Complications: No notable events documented.

## 2023-04-27 NOTE — Discharge Planning (Signed)
RNCM contacted by daughter, Lynden Ang regarding disposition plan.  RNCM advised that the [facility] team from [facility] will reach out to her to coordinate.

## 2023-04-27 NOTE — TOC Progression Note (Signed)
Transition of Care [facility]) - Progression Note    Patient Details  Name: Mikayla [name] MRN: [id number] Date of Birth: 07/02/1925  [facility] of [facility] CM/SW Contact  Catrina Fellenz [place], [place] Phone Number: 04/27/2023, 10:27 AM  Clinical Narrative:      CSW informed by ED RNCM that daughter is unsure if [facility] still has a bed for pt. CSW contacted [facility] and is informed that bed availability will be dependent on pt's DC date which is currently unknown.   CSW called daughter to discuss. Daughter is aware that bed availability is dependent on when pt is Dcing. She is interested in [facility] as a second option. She has questions about follow up appointment that was scheduled by [facility]. CSW agreed to notify [facility]. Fl2 completed and sent to [facility]. TOC will continue to follow.    Social Determinants of Health (SDOH) Interventions SDOH Screenings   Food Insecurity: No Food Insecurity (04/26/2023)  Housing: Low Risk  (04/12/2023)  Transportation Needs: No Transportation Needs (04/26/2023)  Utilities: Not At Risk (04/26/2023)  Depression (PHQ2-9): Low Risk  (01/05/2023)  Tobacco Use: Low Risk  (04/27/2023)    Readmission Risk Interventions    01/17/2022   12:53 PM  Readmission Risk Prevention Plan  Transportation Screening Complete  PCP or Specialist Appt within 5-7 Days Complete  Home Care Screening Complete  Medication Review (RN CM) Complete

## 2023-04-27 NOTE — Evaluation (Signed)
Physical Therapy Evaluation Patient Details Name: Mikayla [name] MRN: [id number] DOB: [birth year] Today's Date: 04/27/2023  History of Present Illness  89 y.o. female presents from SNF on 04/23/2023 due to concern for abnormal labs and appearing pale. Found to have HGB 5.7 (now up to 7.6), AKI, mild hyonatremia, acute encephalopthy. PMHx: L femur IMN nail on 11/29, HTN,T2DM, CKD, inferior STEMI s/p angioplasty to RCA, chronic diastolic heart failure, vision loss, HOH.  Clinical Impression  Pt presents to PT with deficits in strength, power, gait, balance, functional mobility, endurance. Pt presents with significant weakness and requires physical assistance for all functional mobility at this time. Pt is anxious during transfers, reporting a fear of falling. Pt will benefit from continued functional mobility as tolerated in an effort to improve activity tolerance and reduce falls risk. Patient will benefit from continued inpatient follow up therapy, <3 hours/day.      If plan is discharge home, recommend the following: A lot of help with walking and/or transfers;A lot of help with bathing/dressing/bathroom;Assistance with cooking/housework;Assist for transportation;Help with stairs or ramp for entrance;Supervision due to cognitive status   Can travel by private vehicle   No    Equipment Recommendations  (TBD at post-acute setting)  Recommendations for Other Services       Functional Status Assessment Patient has had a recent decline in their functional status and demonstrates the ability to make significant improvements in function in a reasonable and predictable amount of time.     Precautions / Restrictions Precautions Precautions: Fall Restrictions Weight Bearing Restrictions Per Provider Order: Yes LLE Weight Bearing Per Provider Order: Touchdown weight bearing      Mobility  Bed Mobility Overal bed mobility: Needs Assistance Bed Mobility: Supine to Sit     Supine to sit:  Min assist, +2 for physical assistance, HOB elevated          Transfers Overall transfer level: Needs assistance Equipment used: Rolling walker (2 wheels), 1 person hand held assist Transfers: Sit to/from Stand, Bed to chair/wheelchair/BSC Sit to Stand: Mod assist, +2 physical assistance, Max assist (modA x2 with RW from bed, maxA from [facility] with face to face assist of PT)     Squat pivot transfers: Max assist (maxA x2 with RW, walker resulting in increased difficulty at this time as pt having great difficulty managing device)          Ambulation/Gait                  Stairs            Wheelchair Mobility     Tilt Bed    Modified Rankin (Stroke Patients Only)       Balance Overall balance assessment: Needs assistance Sitting-balance support: Feet supported, No upper extremity supported Sitting balance-Leahy Scale: Fair     Standing balance support: Bilateral upper extremity supported, Reliant on assistive device for balance Standing balance-Leahy Scale: Poor Standing balance comment: modA x2                             Pertinent Vitals/Pain Pain Assessment Pain Assessment: No/denies pain    Home Living Family/patient expects to be discharged to:: Private residence Living Arrangements: Alone Available Help at Discharge: Family;Available PRN/intermittently Type of Home: House Home Access: Stairs to enter Entrance Stairs-Rails: None Entrance Stairs-Number of Steps: 2   Home Layout: One level Home Equipment: Cane - single point;Grab bars - tub/shower  Prior Function Prior Level of Function : Needs assist;History of Falls (last six months)             Mobility Comments: Uses cane outside, no AD in the house. had been working on standing with walker at rehab ADLs Comments: Has a driver x1 a week for appointments, impaired vision L eye macular degenration, R eye catarats     Extremity/Trunk Assessment   Upper Extremity  Assessment Upper Extremity Assessment: Defer to OT evaluation    Lower Extremity Assessment Lower Extremity Assessment: Generalized weakness    Cervical / Trunk Assessment Cervical / Trunk Assessment: Kyphotic  Communication   Communication Communication: Hearing impairment Cueing Techniques: Verbal cues  Cognition Arousal: Alert Behavior During Therapy: Anxious Overall Cognitive Status: Within Functional Limits for tasks assessed                                          General Comments General comments (skin integrity, edema, etc.): VSS on RA, hypertension 168/91, pt with bowel movement noted to have dark tarry stool, RN made aware    Exercises     Assessment/Plan    PT Assessment Patient needs continued PT services  PT Problem List Decreased strength;Decreased activity tolerance;Decreased balance;Decreased mobility;Decreased knowledge of use of DME;Decreased safety awareness;Decreased knowledge of precautions;Pain       PT Treatment Interventions DME instruction;Gait training;Stair training;Functional mobility training;Therapeutic activities;Therapeutic exercise;Balance training;Neuromuscular re-education;Patient/family education;Wheelchair mobility training    PT Goals (Current goals can be found in the Care Plan section)  Acute Rehab PT Goals Patient Stated Goal: to return to independence, reduce falls risk PT Goal Formulation: With patient Time For Goal Achievement: 05/11/23 Potential to Achieve Goals: Fair    Frequency Min 1X/week     Co-evaluation PT/OT/SLP Co-Evaluation/Treatment: Yes Reason for Co-Treatment: For patient/therapist safety;To address functional/ADL transfers PT goals addressed during session: Mobility/safety with mobility;Balance;Proper use of DME;Strengthening/ROM         AM-PAC PT "6 Clicks" Mobility  Outcome Measure Help needed turning from your back to your side while in a flat bed without using bedrails?: A  Lot Help needed moving from lying on your back to sitting on the side of a flat bed without using bedrails?: A Lot Help needed moving to and from a bed to a chair (including a wheelchair)?: A Lot Help needed standing up from a chair using your arms (e.g., wheelchair or bedside chair)?: A Lot Help needed to walk in hospital room?: Total Help needed climbing 3-5 steps with a railing? : Total 6 Click Score: 10    End of Session Equipment Utilized During Treatment: Gait belt Activity Tolerance: Patient limited by fatigue Patient left: with call bell/phone within reach;in chair;with chair alarm set Nurse Communication: Mobility status (2 person assist) PT Visit Diagnosis: Other abnormalities of gait and mobility (R26.89);Muscle weakness (generalized) (M62.81)    Time: 1308-6578 PT Time Calculation (min) (ACUTE ONLY): 32 min   Charges:   PT Evaluation $PT Eval Moderate Complexity: 1 Mod   PT General Charges $$ ACUTE PT VISIT: 1 Visit         Arlyss Gandy, PT, DPT Acute Rehabilitation Office [phone]   Arlyss Gandy 04/27/2023, 3:06 PM

## 2023-04-27 NOTE — Op Note (Addendum)
[facility] Patient Name: Mikayla [name] Procedure Date : 04/27/2023 MRN: [id number] Attending MD: Mikayla [name] , MD, [id number] Date of Birth: Nov 04, 1925 CSN: [id number] Age: 89 Admit Type: Inpatient Procedure:                Upper GI endoscopy Indications:              Acute post hemorrhagic anemia, Melena                           Clinical details and hospital consult and progress                            notes                           Recent left hip fracture with ORIF. On Plavix for                            history of CAD with stent, recurrent significant                            drops in hemoglobin.                           No retroperitoneal bleed on inpatient CTAP, no                            postoperative hematoma Providers:                Mikayla Cooter L. Mikayla Neither, MD, Mikayla Evangelist, RN, Mikayla [name], Technician Referring MD:             Mikayla [name] Medicines:                Monitored Anesthesia Care Complications:            No immediate complications. Estimated Blood Loss:     Estimated blood loss: none. Procedure:                Pre-Anesthesia Assessment:                           - Prior to the procedure, a History and Physical                            was performed, and patient medications and                            allergies were reviewed. The patient's tolerance of                            previous anesthesia was also reviewed. The risks                            and benefits of the procedure and the sedation  options and risks were discussed with the patient.                            All questions were answered, and informed consent                            was obtained. Prior Anticoagulants: The patient has                            taken Plavix (clopidogrel), last dose was 4 days                            prior to procedure. ASA Grade Assessment: IV - A                             patient with severe systemic disease that is a                            constant threat to life. After reviewing the risks                            and benefits, the patient was deemed in                            satisfactory condition to undergo the procedure.                           After obtaining informed consent, the endoscope was                            passed under direct vision. Throughout the                            procedure, the patient's blood pressure, pulse, and                            oxygen saturations were monitored continuously. The                            [id number]-H190 ([id number]) Olympus endoscope was introduced                            through the mouth, and advanced to the proximal                            jejunum. The [id number]-HQ190TL ([id number]) Olympus peds                            colonoscope was introduced through the mouth, and                            advanced to the jejunum. The upper GI endoscopy was  accomplished without difficulty. The patient                            tolerated the procedure. Scope In: Scope Out: Findings:      A small hiatal hernia was present.      A moderate Schatzki ring was found at the gastroesophageal junction.      The exam of the esophagus was otherwise normal.      The stomach was normal.      The cardia and gastric fundus were normal on retroflexion.      The examined duodenum was normal except for nonspecific mucosal fold       edema in the bulb.      After completing the EGD, the scope was withdrawn and changed for       pediatric colonoscope because a rectal exam revealed scant liquid black       stool in the rectal vault (no impaction). The pediatric colonoscope was       then inserted into the mouth and advanced to the proximal jejunum.      The examined proximal jejunum was normal. No fresh or old blood anywhere       in the examined GI tract, no bleeding source was  found. Impression:               - Small hiatal hernia.                           - Moderate Schatzki ring.                           - Normal stomach.                           - Normal examined duodenum except mild mucosal fold                            edema.                           - Normal examined jejunum.                           - No specimens collected. Recommendation:           - Return patient to hospital ward for ongoing care.                           - Clear liquid diet today. (with some glucerna                            supplements today)                           - Moviprep bowel preparation for relief of recent                            severe postoperative constipation as well as  probable need for colonoscopy to identify bleeding                            source if patient is willing.                           - Continue every 6 hour hemoglobin and hematocrit                            with transfusion as needed.                           - Remain off plavix for now.                           - Discussed results and plan with patient's                            daughter by phone. Procedure Code(s):        --- Professional ---                           [phone], Small intestinal endoscopy, enteroscopy                            beyond second portion of duodenum, not including                            ileum; diagnostic, including collection of                            specimen(s) by brushing or washing, when performed                            (separate procedure) Diagnosis Code(s):        --- Professional ---                           K44.9, Diaphragmatic hernia without obstruction or                            gangrene                           K22.2, Esophageal obstruction                           D62, Acute posthemorrhagic anemia                           K92.1, Melena (includes Hematochezia) CPT copyright 2022 American  Medical Association. All rights reserved. The codes documented in this report are preliminary and upon coder review may  be revised to meet current compliance requirements. Mikayla Espino L. Mikayla Neither, MD 04/27/2023 9:10:54 AM This report has been signed electronically. Number of Addenda: 0

## 2023-04-27 NOTE — Progress Notes (Signed)
Progress Note   Patient: Mikayla [name] PPI:[id number] DOB: 1925-05-30 DOA: 04/23/2023     4 DOS: the patient was seen and examined on 04/27/2023   Brief hospital course: 89 year old female, presents from SNF for concern of abnormal labs and some left flank pain.  In the ED, labs notable for hemoglobin of 5.7 with baseline 9-10, BUN 135, with creatinine 1.8, baseline 1.3-1.5.  CT abdomen pelvis negative for acute findings.  2 units of PRBC ordered in the ED. GI consulted for further management.  Patient admitted for further management.  Of note, patient had recent left femur fracture surgery.   Assessment and Plan: Acute on chronic normocytic anemia Hemoglobin down to 5.7 without obvious source of bleeding.  Baseline hemoglobin appears to be in the 9-10 range.   -GI is following.  -Per GI , anemia is likely multifactorial and not secondary to significant GI bleed, as pt had no evidence of melena or hematochezia PTA -Hgb was down to 5.4 from 7.6 12/12. Pt now s/p 2 units PRBC"s with appropriate correction. Hgb stable thus far - ASA and plavix remain on hold for now -Pt now s/p EGD, unremarkable. Per GI, consider f/u colonoscopy after bowel prep   AKI on CKD stage IIIb Likely prerenal in etiology from dehydration.  Holding home Lasix and losartan. Cr improved with gentle hydration   Hyperkalemia In the setting of AKI on CKD stage IIIb.  Potassium initially 6.2 and EKG without acute changes.  Patient received albuterol, insulin, and Lokelma in the ED.  Hyperkalemia has now resolved    Acute encephalopathy CT head showing chronic heavily calcified meningioma and no acute intracranial abnormality.  No focal neurodeficit on exam.  -Mentation has normalized. Pt conversing very appropriately   Mild hyponatremia Normalized   Hypocalcemia ruled out Ca did correct to normal   Left flank pain CT abdomen pelvis negative for acute findings; no renal calculi or obstructive changes seen.    Leukocytosis No fever or signs of sepsis.  No obvious infectious source on CT abdomen pelvis.  Ordered chest x-ray and UA. WBC improved   Chronic HFpEF Last echo done in September 2023 showing EF 55 to 60%, grade 2 diastolic dysfunction, moderate MVR.   Appears euvolemic this AM.   CAD status post PCI in September 2023 EKG without acute ischemic changes.  ASA and plavix currently on hold for now. Defer to GI when these can be resumed   Non-insulin-dependent type 2 diabetes Glucose in the 220s.  Last A1c 6.1 on 01/05/2023.  Ordered sensitive sliding scale insulin ACHS  Constipation -Continue with cathartics per GI recs Pt to begin bowel prep this afternoon     Subjective: No complaints this AM  Physical Exam: Vitals:   04/27/23 1004 04/27/23 1100 04/27/23 1156 04/27/23 1200  BP:    (!) 129/103  Pulse:  88  78  Resp:  (!) 23  18  Temp: 98.5 F (36.9 C)  (!) 97.5 F (36.4 C)   TempSrc: Oral  Oral   SpO2:  100%  98%  Weight:      Height:       General exam: Awake, laying in bed, in nad Respiratory system: Normal respiratory effort, no wheezing Cardiovascular system: regular rate, s1, s2 Gastrointestinal system: Soft, nondistended, positive BS Central nervous system: CN2-12 grossly intact, strength intact Extremities: Perfused, no clubbing Skin: Normal skin turgor, no notable skin lesions seen Psychiatry: Mood normal // no visual hallucinations   Data Reviewed:  Labs reviewed: Na  142, K 4.6, Cr 1.57, Hgb 7.5  Family Communication: Pt in room, family not at bedside  Disposition: Status is: Inpatient Remains inpatient appropriate because: Severity of illness  Planned Discharge Destination:  Pending PT/OT eval    Author: Rickey Barbara, MD 04/27/2023 3:39 PM  For on call review [email or web address].

## 2023-04-27 NOTE — Anesthesia Procedure Notes (Signed)
Procedure Name: MAC Date/Time: 04/27/2023 8:34 AM  Performed by: Gwenyth Allegra, CRNAPre-anesthesia Checklist: Patient identified, Emergency Drugs available, Suction available, Patient being monitored and Timeout performed Patient Re-evaluated:Patient Re-evaluated prior to induction Oxygen Delivery Method: Nasal cannula Induction Type: IV induction

## 2023-04-27 NOTE — NC FL2 (Signed)
[place] MEDICAID FL2 LEVEL OF CARE FORM     IDENTIFICATION  Patient Name: Mikayla [name] Birthdate: [birth year] Sex: female Admission Date (Current Location): 04/23/2023  [facility] and [place] Number:  Producer, television/film/video and Address:  [facility] [place], [address] [place], [place] [address]      Provider Number: [id number]  Attending Physician Name and Address:  Jerald Kief, MD  Relative Name and Phone Number:  [name] (daughter) [phone]    Current Level of Care: Hospital Recommended Level of Care: Skilled Nursing Facility Prior Approval Number:    Date Approved/Denied:   PASRR Number: [id number]  Discharge Plan: SNF    Current Diagnoses: Patient Active Problem List   Diagnosis Date Noted   Melena 04/27/2023   ABLA (acute blood loss anemia) 04/27/2023   Acute encephalopathy 04/24/2023   Chronic heart failure with preserved ejection fraction (HFpEF) (HCC) 04/24/2023   CAD (coronary artery disease) 04/24/2023   Acute on chronic anemia 04/23/2023   Closed left hip fracture (HCC) 04/12/2023   Acute on chronic diastolic heart failure (HCC)    Hyponatremia    Acute ST elevation myocardial infarction (STEMI) of inferior wall (HCC) 01/15/2022   Right coronary artery occlusion (HCC)    Carotid stenosis, asymptomatic, left 06/19/2019   Carotid bruit 06/10/2019   DOE (dyspnea on exertion) 05/25/2019   Hyperkalemia 05/25/2019   Acute renal failure superimposed on stage 3b chronic kidney disease (HCC) 05/25/2019   Chronic renal disease, stage III (HCC) 05/21/2019   Anemia, normocytic normochromic 12/19/2017   Acute upper respiratory infection 07/25/2016   Hypothyroidism, acquired 05/04/2016   Fatigue 09/22/2014   Primary hypertension 03/03/2014   Back pain 02/04/2012   Mild intermittent extrinsic asthma without complication 08/06/2009   PELVIC MASS 06/25/2008   CYSTITIS, ACUTE 06/09/2008   Essential hypertension, benign 09/12/2007    Type 2 diabetes mellitus (HCC) 06/10/2007   Hypercholesteremia 06/10/2007   OBESITY 06/10/2007   RHINITIS 06/10/2007   Osteoporosis/osteopenia increased risk 06/10/2007    Orientation RESPIRATION BLADDER Height & Weight     Time, Self, Situation  Normal Continent, External catheter Weight: 122 lb 4.8 oz (55.5 kg) Height:  4\' 11"  (149.9 cm)  BEHAVIORAL SYMPTOMS/MOOD NEUROLOGICAL BOWEL NUTRITION STATUS      Continent Diet (see d/c summary)  AMBULATORY STATUS COMMUNICATION OF NEEDS Skin   Extensive Assist Verbally Surgical wounds (incision left thigh)                       Personal Care Assistance Level of Assistance  Bathing, Feeding, Dressing Bathing Assistance: Maximum assistance Feeding assistance: Independent Dressing Assistance: Maximum assistance     Functional Limitations Info  Sight, Hearing, Speech Sight Info: Impaired Hearing Info: Impaired Speech Info: Adequate    SPECIAL CARE FACTORS FREQUENCY  OT (By licensed OT), PT (By licensed PT)     PT Frequency: 5x/week OT Frequency: 5x/week            Contractures Contractures Info: Not present    Additional Factors Info  Code Status, Allergies, Insulin Sliding Scale Code Status Info: Full Allergies Info: Amoxil (amoxicillin),Isosorb Dinitrate-hydralazine,Lipitor (atorvastatin),Motrin (ibuprofen),Statins   Insulin Sliding Scale Info: insulin aspart (novoLOG) injection 0-9 Units every 4 hours       Current Medications (04/27/2023):  This is the current hospital active medication list Current Facility-Administered Medications  Medication Dose Route Frequency Provider Last Rate Last Admin   acetaminophen (TYLENOL) tablet 650 mg  650 mg Oral Q6H  PRN Sherrilyn Rist, MD   650 mg at 04/26/23 1523   Or   acetaminophen (TYLENOL) suppository 650 mg  650 mg Rectal Q6H PRN Charlie Pitter III, MD       albuterol (PROVENTIL) (2.5 MG/3ML) 0.083% nebulizer solution 3 mL  3 mL Inhalation Q6H PRN Charlie Pitter  III, MD       amLODipine (NORVASC) tablet 5 mg  5 mg Oral Daily Charlie Pitter III, MD   5 mg at 04/27/23 1023   Chlorhexidine Gluconate Cloth 2 % PADS 6 each  6 each Topical Daily Charlie Pitter III, MD   6 each at 04/27/23 1024   dorzolamide-timolol (COSOPT) 2-0.5 % ophthalmic solution 1 drop  1 drop Both Eyes BID Charlie Pitter III, MD   1 drop at 04/27/23 1024   ezetimibe (ZETIA) tablet 10 mg  10 mg Oral Daily Charlie Pitter III, MD   10 mg at 04/27/23 1022   feeding supplement (GLUCERNA SHAKE) (GLUCERNA SHAKE) liquid 237 mL  237 mL Oral TID BM Charlie Pitter III, MD       insulin aspart (novoLOG) injection 0-5 Units  0-5 Units Subcutaneous QHS Charlie Pitter III, MD       insulin aspart (novoLOG) injection 0-9 Units  0-9 Units Subcutaneous TID WC Charlie Pitter III, MD   2 Units at 04/26/23 1741   lactated ringers infusion   Intravenous Continuous Danis, Starr [place] III, MD       levothyroxine (SYNTHROID) tablet 50 mcg  50 mcg Oral Q0600 Charlie Pitter III, MD   50 mcg at 04/26/23 0546   mometasone-formoterol (DULERA) 100-5 MCG/ACT inhaler 2 puff  2 puff Inhalation BID Charlie Pitter III, MD   2 puff at 04/26/23 0848   nitroGLYCERIN (NITROSTAT) SL tablet 0.4 mg  0.4 mg Sublingual Q5 min PRN Sherrilyn Rist, MD       ondansetron (ZOFRAN) injection 4 mg  4 mg Intravenous Q6H PRN Charlie Pitter III, MD   4 mg at 04/25/23 2041   pantoprazole (PROTONIX) injection 40 mg  40 mg Intravenous Q12H Charlie Pitter III, MD   40 mg at 04/27/23 1023   peg 3350 powder (MOVIPREP) kit 100 g  0.5 kit Oral Once Francena Hanly, [facility]       And   [START ON 04/28/2023] peg 3350 powder (MOVIPREP) kit 100 g  0.5 kit Oral Once Francena Hanly, RPH       polyethylene glycol (MIRALAX / GLYCOLAX) packet 17 g  17 g Oral BID Charlie Pitter III, MD   17 g at 04/27/23 1023   senna (SENOKOT) tablet 8.6 mg  1 tablet Oral BID Charlie Pitter III, MD   8.6 mg at 04/27/23 1023     Discharge Medications: Please see discharge  summary for a list of discharge medications.  Relevant Imaging Results:  Relevant Lab Results:   Additional Information SSN-[id number]  Erin Sons, [place]

## 2023-04-27 NOTE — Evaluation (Signed)
Occupational Therapy Evaluation Patient Details Name: Mikayla [name] MRN: [id number] DOB: Mar 15, 1926 Today's Date: 04/27/2023   History of Present Illness 89 y.o. female presents from SNF on 04/23/2023 due to concern for abnormal labs and appearing pale. Found to have HGB 5.7 (now up to 7.6), AKI, mild hyonatremia, acute encephalopthy. PMHx: L femur IMN nail on 11/29, HTN,T2DM, CKD, inferior STEMI s/p angioplasty to RCA, chronic diastolic heart failure, vision loss, HOH.   Clinical Impression   Pt was living alone prior to her recent fall with L femur fx and using a cane outside. She does not drive due to poor vision. Pt could complete basic ADLs and light IADLs independently. Pt presents with generalized weakness, fear of falling and zero standing balance. She requires +2 mod assist for stand pivot, max assist for squat pivot transfers. She needs set up to total assist for ADLs. Patient will benefit from continued inpatient follow up therapy, <3 hours/day.      If plan is discharge home, recommend the following: Two people to help with walking and/or transfers;Two people to help with bathing/dressing/bathroom    Functional Status Assessment  Patient has had a recent decline in their functional status and/or demonstrates limited ability to make significant improvements in function in a reasonable and predictable amount of time  Equipment Recommendations  Other (comment) (defer)    Recommendations for Other Services       Precautions / Restrictions Precautions Precautions: Fall Precaution Comments: HoH Restrictions Weight Bearing Restrictions Per Provider Order: Yes LLE Weight Bearing Per Provider Order: Touchdown weight bearing      Mobility Bed Mobility Overal bed mobility: Needs Assistance Bed Mobility: Supine to Sit     Supine to sit: Min assist, +2 for physical assistance, HOB elevated          Transfers Overall transfer level: Needs assistance Equipment used:  Rolling walker (2 wheels), 1 person hand held assist Transfers: Sit to/from Stand, Bed to chair/wheelchair/BSC Sit to Stand: Mod assist, +2 physical assistance, Max assist   Squat pivot transfers: Max assist       General transfer comment: pt fearful of falling, unable to use RW to unload L LE, +2 mod assist bed>BSC, one person max assist to squat pivot to chair      Balance Overall balance assessment: Needs assistance Sitting-balance support: Feet supported, No upper extremity supported Sitting balance-Leahy Scale: Fair     Standing balance support: Bilateral upper extremity supported, Reliant on assistive device for balance Standing balance-Leahy Scale: Poor                             ADL either performed or assessed with clinical judgement   ADL Overall ADL's : Needs assistance/impaired Eating/Feeding: Set up;Sitting   Grooming: Set up;Sitting   Upper Body Bathing: Moderate assistance;Sitting   Lower Body Bathing: Total assistance;+2 for physical assistance;Sit to/from stand   Upper Body Dressing : Moderate assistance;Sitting   Lower Body Dressing: Total assistance;+2 for physical assistance;Sit to/from stand   Toilet Transfer: Stand-pivot;+2 for physical assistance;Maximal assistance;Rolling walker (2 wheels);BSC/3in1   Toileting- Clothing Manipulation and Hygiene: Total assistance               Vision Baseline Vision/History: 6 Macular Degeneration;4 Cataracts;1 Wears glasses Ability to See in Adequate Light: 1 Impaired Patient Visual Report: Blurring of vision Additional Comments: pt cannot see large print     Perception  Praxis         Pertinent Vitals/Pain Pain Assessment Pain Assessment: No/denies pain     Extremity/Trunk Assessment Upper Extremity Assessment Upper Extremity Assessment: Generalized weakness;Right hand dominant   Lower Extremity Assessment Lower Extremity Assessment: Generalized weakness   Cervical /  Trunk Assessment Cervical / Trunk Assessment: Kyphotic;Other exceptions (weakness)   Communication Communication Communication: Hearing impairment Cueing Techniques: Verbal cues   Cognition Arousal: Alert Behavior During Therapy: Anxious Overall Cognitive Status: Within Functional Limits for tasks assessed                                       General Comments  VSS on RA, hypertension 168/91, pt with bowel movement noted to have dark tarry stool, RN made aware    Exercises     Shoulder Instructions      Home Living Family/patient expects to be discharged to:: Private residence Living Arrangements: Alone Available Help at Discharge: Family;Available PRN/intermittently Type of Home: House Home Access: Stairs to enter [employer] of Steps: 2 Entrance Stairs-Rails: None Home Layout: One level     Bathroom Shower/Tub: Chief Strategy Officer: Standard     Home Equipment: Cane - single point;Grab bars - tub/shower          Prior Functioning/Environment Prior Level of Function : Needs assist;History of Falls (last six months)             Mobility Comments: Uses cane outside, no AD in the house. had been working on standing with walker at rehab ADLs Comments: Has a driver x1 a week for appointments, independent in self care and light IADLs        OT Problem List: Decreased strength;Decreased activity tolerance;Impaired balance (sitting and/or standing);Decreased knowledge of use of DME or AE;Decreased knowledge of precautions      OT Treatment/Interventions: Self-care/ADL training;DME and/or AE instruction;Therapeutic activities;Patient/family education;Balance training;Therapeutic exercise    OT Goals(Current goals can be found in the care plan section) Acute Rehab OT Goals OT Goal Formulation: With patient Time For Goal Achievement: 05/11/23 Potential to Achieve Goals: Good ADL Goals Pt Will Perform Grooming: with  supervision;sitting Pt Will Perform Upper Body Bathing: with min assist;sitting Pt Will Perform Upper Body Dressing: with min assist;sitting Pt Will Transfer to Toilet: with mod assist;stand pivot transfer;bedside commode Pt/caregiver will Perform Home Exercise Program: Increased strength;Both right and left upper extremity;With written HEP provided;With Supervision (level 2 theraband) Additional ADL Goal #1: Pt will complete bed mobility with min assist in preparation for ADLs.  OT Frequency: Min 1X/week    Co-evaluation PT/OT/SLP Co-Evaluation/Treatment: Yes Reason for Co-Treatment: For patient/therapist safety PT goals addressed during session: Mobility/safety with mobility;Balance;Proper use of DME;Strengthening/ROM        AM-PAC OT "6 Clicks" Daily Activity     Outcome Measure Help from another person eating meals?: A Little Help from another person taking care of personal grooming?: A Little Help from another person toileting, which includes using toliet, bedpan, or urinal?: Total Help from another person bathing (including washing, rinsing, drying)?: A Lot Help from another person to put on and taking off regular upper body clothing?: A Lot Help from another person to put on and taking off regular lower body clothing?: Total 6 Click Score: 12   End of Session Equipment Utilized During Treatment: Gait belt;Rolling walker (2 wheels) Nurse Communication: Mobility status;Other (comment) (had BM, needs new sacral patch)  Activity Tolerance: Patient tolerated treatment well Patient left: in chair;with call bell/phone within reach;with chair alarm set  OT Visit Diagnosis: Unsteadiness on feet (R26.81);Muscle weakness (generalized) (M62.81);History of falling (Z91.81)                Time: 1191-4782 OT Time Calculation (min): 37 min Charges:  OT General Charges $OT Visit: 1 Visit OT Evaluation $OT Eval Moderate Complexity: 1 Mod  Berna Spare, OTR/L Acute Rehabilitation  Services Office: [phone]   Evern Bio 04/27/2023, 4:07 PM

## 2023-04-27 NOTE — Anesthesia Postprocedure Evaluation (Signed)
Anesthesia Post Note  Patient: Mikayla [name]  Procedure(s) Performed: ESOPHAGOGASTRODUODENOSCOPY (EGD) WITH PROPOFOL     Patient location during evaluation: Endoscopy Anesthesia Type: MAC Level of consciousness: awake and alert Pain management: pain level controlled Vital Signs Assessment: post-procedure vital signs reviewed and stable Respiratory status: spontaneous breathing, nonlabored ventilation, respiratory function stable and patient connected to nasal cannula oxygen Cardiovascular status: blood pressure returned to baseline and stable Postop Assessment: no apparent nausea or vomiting Anesthetic complications: no  No notable events documented.  Last Vitals:  Vitals:   04/27/23 0910 04/27/23 0920  BP: (!) 160/46 (!) 160/45  Pulse: 75 74  Resp: 19 (!) 21  Temp:    SpO2: 99% 99%    Last Pain:  Vitals:   04/27/23 0920  TempSrc:   PainSc: 0-No pain                 Zai Chmiel L Leahna Hewson

## 2023-04-27 NOTE — Progress Notes (Signed)
OT Cancellation Note  Patient Details Name: Mikayla [name] MRN: [id number] DOB: [birth year]   Cancelled Treatment:    Reason Eval/Treat Not Completed: Patient at procedure or test/ unavailable  Evern Bio 04/27/2023, 8:01 AM Berna Spare, OTR/L Acute Rehabilitation Services Office: [phone]

## 2023-04-28 ENCOUNTER — Inpatient Hospital Stay (HOSPITAL_COMMUNITY): Payer: Medicare Other

## 2023-04-28 DIAGNOSIS — D62 Acute posthemorrhagic anemia: Secondary | ICD-10-CM

## 2023-04-28 DIAGNOSIS — Z7901 Long term (current) use of anticoagulants: Secondary | ICD-10-CM

## 2023-04-28 DIAGNOSIS — I2510 Atherosclerotic heart disease of native coronary artery without angina pectoris: Secondary | ICD-10-CM

## 2023-04-28 DIAGNOSIS — K921 Melena: Secondary | ICD-10-CM

## 2023-04-28 HISTORY — PX: IR ANGIOGRAM SELECTIVE EACH ADDITIONAL VESSEL: IMG667

## 2023-04-28 HISTORY — PX: IR US GUIDE VASC ACCESS RIGHT: IMG2390

## 2023-04-28 HISTORY — PX: IR ANGIOGRAM VISCERAL SELECTIVE: IMG657

## 2023-04-28 LAB — PREPARE RBC (CROSSMATCH)

## 2023-04-28 LAB — COMPREHENSIVE METABOLIC PANEL
ALT: 11 U/L (ref 0–44)
AST: 12 U/L — ABNORMAL LOW (ref 15–41)
Albumin: 2 g/dL — ABNORMAL LOW (ref 3.5–5.0)
Alkaline Phosphatase: 29 U/L — ABNORMAL LOW (ref 38–126)
Anion gap: 4 — ABNORMAL LOW (ref 5–15)
BUN: 93 mg/dL — ABNORMAL HIGH (ref 8–23)
CO2: 20 mmol/L — ABNORMAL LOW (ref 22–32)
Calcium: 7.6 mg/dL — ABNORMAL LOW (ref 8.9–10.3)
Chloride: 117 mmol/L — ABNORMAL HIGH (ref 98–111)
Creatinine, Ser: 1.42 mg/dL — ABNORMAL HIGH (ref 0.44–1.00)
GFR, Estimated: 34 mL/min — ABNORMAL LOW (ref >60)
Glucose, Bld: 160 mg/dL — ABNORMAL HIGH (ref 70–99)
Potassium: 4 mmol/L (ref 3.5–5.1)
Sodium: 141 mmol/L (ref 135–145)
Total Bilirubin: 0.8 mg/dL (ref <1.2)
Total Protein: 4 g/dL — ABNORMAL LOW (ref 6.5–8.1)

## 2023-04-28 LAB — HEMOGLOBIN AND HEMATOCRIT, BLOOD
HCT: 18.1 % — ABNORMAL LOW (ref 36.0–46.0)
HCT: 20.6 % — ABNORMAL LOW (ref 36.0–46.0)
Hemoglobin: 6 g/dL — CL (ref 12.0–15.0)
Hemoglobin: 7 g/dL — ABNORMAL LOW (ref 12.0–15.0)

## 2023-04-28 LAB — CBC
HCT: 18.7 % — ABNORMAL LOW (ref 36.0–46.0)
Hemoglobin: 6.1 g/dL — CL (ref 12.0–15.0)
MCH: 30.3 pg (ref 26.0–34.0)
MCHC: 32.6 g/dL (ref 30.0–36.0)
MCV: 93 fL (ref 80.0–100.0)
Platelets: 239 10*3/uL (ref 150–400)
RBC: 2.01 MIL/uL — ABNORMAL LOW (ref 3.87–5.11)
RDW: 15.8 % — ABNORMAL HIGH (ref 11.5–15.5)
WBC: 10.5 10*3/uL (ref 4.0–10.5)
nRBC: 0.3 % — ABNORMAL HIGH (ref 0.0–0.2)

## 2023-04-28 LAB — GLUCOSE, CAPILLARY
Glucose-Capillary: 125 mg/dL — ABNORMAL HIGH (ref 70–99)
Glucose-Capillary: 141 mg/dL — ABNORMAL HIGH (ref 70–99)
Glucose-Capillary: 154 mg/dL — ABNORMAL HIGH (ref 70–99)
Glucose-Capillary: 165 mg/dL — ABNORMAL HIGH (ref 70–99)

## 2023-04-28 MED ORDER — IOHEXOL 300 MG/ML  SOLN
100.0000 mL | Freq: Once | INTRAMUSCULAR | Status: AC | PRN
  Administered 2023-04-28: 100 mL via INTRA_ARTERIAL

## 2023-04-28 MED ORDER — LORAZEPAM 2 MG/ML IJ SOLN: INTRAMUSCULAR | Status: AC | PRN

## 2023-04-28 MED ORDER — MIDAZOLAM HCL 2 MG/2ML IJ SOLN
INTRAMUSCULAR | Status: AC | PRN
  Administered 2023-04-28: 1 mg via INTRAVENOUS
  Administered 2023-04-28 (×3): .5 mg via INTRAVENOUS

## 2023-04-28 MED ORDER — IOHEXOL 300 MG/ML  SOLN
100.0000 mL | Freq: Once | INTRAMUSCULAR | Status: AC | PRN
  Administered 2023-04-28: 22 mL via INTRA_ARTERIAL

## 2023-04-28 MED ORDER — MIDAZOLAM HCL 2 MG/2ML IJ SOLN
INTRAMUSCULAR | Status: AC
  Filled 2023-04-28: qty 2

## 2023-04-28 MED ORDER — LIDOCAINE HCL 1 % IJ SOLN
INTRAMUSCULAR | Status: AC
  Filled 2023-04-28: qty 20

## 2023-04-28 MED ORDER — SODIUM CHLORIDE 0.9% IV SOLUTION: Freq: Once | INTRAVENOUS | Status: DC

## 2023-04-28 MED ORDER — SODIUM CHLORIDE 0.9% IV SOLUTION: Freq: Once | INTRAVENOUS | Status: AC

## 2023-04-28 MED ORDER — FENTANYL CITRATE (PF) 100 MCG/2ML IJ SOLN
INTRAMUSCULAR | Status: AC
  Filled 2023-04-28: qty 2

## 2023-04-28 MED ORDER — TECHNETIUM TC 99M-LABELED RED BLOOD CELLS IV KIT
24.0000 | PACK | Freq: Once | INTRAVENOUS | Status: AC | PRN
  Administered 2023-04-28: 24 via INTRAVENOUS

## 2023-04-28 MED ORDER — IOHEXOL 300 MG/ML  SOLN
150.0000 mL | Freq: Once | INTRAMUSCULAR | Status: AC | PRN
  Administered 2023-04-28: 70 mL via INTRA_ARTERIAL

## 2023-04-28 MED ORDER — SODIUM CHLORIDE 0.9 % IV SOLN
INTRAVENOUS | Status: AC | PRN
  Administered 2023-04-28: 10 mL/h via INTRAVENOUS

## 2023-04-28 MED ORDER — FENTANYL CITRATE (PF) 100 MCG/2ML IJ SOLN
INTRAMUSCULAR | Status: AC | PRN
  Administered 2023-04-28 (×4): 25 ug via INTRAVENOUS

## 2023-04-28 MED ORDER — LIDOCAINE HCL 1 % IJ SOLN
20.0000 mL | Freq: Once | INTRAMUSCULAR | Status: AC
Start: 2023-04-28 — End: 2023-04-28
  Administered 2023-04-28: 4 mL via INTRADERMAL
  Filled 2023-04-28: qty 20

## 2023-04-28 MED ORDER — MIDAZOLAM HCL 2 MG/2ML IJ SOLN
INTRAMUSCULAR | Status: AC
Start: 2023-04-28 — End: ?
  Filled 2023-04-28: qty 2

## 2023-04-28 MED ORDER — LACTATED RINGERS IV SOLN: INTRAVENOUS | Status: DC

## 2023-04-28 MED ORDER — HYDROCORTISONE 1 % EX CREA
1.0000 | TOPICAL_CREAM | Freq: Three times a day (TID) | CUTANEOUS | Status: DC | PRN
  Administered 2023-04-28: 1 via TOPICAL
  Filled 2023-04-28: qty 28

## 2023-04-28 NOTE — Progress Notes (Signed)
Few second episode of AV dissociation with HR 36bpm, then converting back to A-fib w/CVR. BP stable, patient asymptomatic.

## 2023-04-28 NOTE — Progress Notes (Signed)
Hgb is 6.1 this am. Plan to transfuse 1 unit RBC.

## 2023-04-28 NOTE — Progress Notes (Signed)
Progress Note   Subjective  Patient has completed about 70% of the prep - dark maroon blood coming out. Hgb dropped to 6s, she is receiving another unit of PRBC. She is not in any pain. Daughter at bedside, son on phone.    Objective   Vital signs in last 24 hours: Temp:  [97.4 F (36.3 C)-98.8 F (37.1 C)] 97.8 F (36.6 C) (12/14 0945) Pulse Rate:  [36-92] 87 (12/14 0945) Resp:  [15-26] 26 (12/14 0945) BP: (116-154)/(40-103) 129/40 (12/14 0945) SpO2:  [86 %-100 %] 100 % (12/14 0945) Last BM Date : 04/28/23 General:    white female in NAD Psych:  Cooperative. Normal mood and affect.  Intake/Output from previous day: 12/13 0701 - 12/14 0700 In: 110.2 [I.V.:110.2] Out: 1300 [Urine:500; Stool:800] Intake/Output this shift: No intake/output data recorded.  Lab Results: Recent Labs    04/26/23 0805 04/26/23 1836 04/27/23 0327 04/27/23 1207 04/28/23 0322 04/28/23 0529  WBC 12.8*  --  13.2*  --  10.5  --   HGB 5.4*   < > 7.5* 7.5* 6.1* 6.0*  HCT 16.7*   < > 21.8* 22.3* 18.7* 18.1*  PLT 247  --  217  --  239  --    < > = values in this interval not displayed.   BMET Recent Labs    04/26/23 0306 04/27/23 0327 04/28/23 0322  NA 137 142 141  K 4.4 4.6 4.0  CL 111 115* 117*  CO2 19* 19* 20*  GLUCOSE 138* 161* 160*  BUN 121* 117* 93*  CREATININE 1.63* 1.57* 1.42*  CALCIUM 7.8* 8.0* 7.6*   LFT Recent Labs    04/28/23 0322  PROT 4.0*  ALBUMIN 2.0*  AST 12*  ALT 11  ALKPHOS 29*  BILITOT 0.8   PT/INR No results for input(s): "LABPROT", "INR" in the last 72 hours.  Studies/Results: No results found.     Assessment / Plan:    89 y/o female here with the following:  GI bleed  Post hemorrhagic anemia CAD on Plavix / aspirin S/p hip fracture with repair  Enteroscopy negative for source of bleeding yesterday per Dr. Myrtie Neither. She was given a bowel prep, drank about 70% of it, passing dark maroon blood per rectum. She has not cleared with the prep  she has drank. Hgb dropped to 6s, getting another unit PRBC transfusion now. Vitals are stable.   Had a thorough discussion with the patient's daughter in room and son on speaker phone about her options. Unclear if she is having colonic vs. Distal small bowel bleed. We could proceed with colonoscopy attempt today but I think visualization will be poor, based on what I see coming out of her, and chance of a high yield exam with visualization is probably low. We discussed other options to evaluate this with either a CTA or tagged RBC scan, or capsule endoscopy. Ideally would do CTA but she has had an AKI, while starting to improve, at risk for contrast nephropathy. I think tagged RBC scan is a reasonable option to help localize where this is coming from. If positive for colonic source can try colonoscopy tomorrow if she is willing to do more prep tonight. If it is a small bowel bleed we may be able to forgo colonoscopy, but would consider IR embolization. They understand risks of embolization would also be contrast related kidney injury, but would consider if it is her best option to stop the bleeding.  Family in agreement with  tagged RBC scan, ordered it STAT, hopefully radiology can do today. Pending findings will determine if we do IR consult or consider colonoscopy tomorrow if she can drink more prep tonight. Trend Hgb, continue PRBC transfusion as needed.   PLAN: - NPO for now - STAT tagged RBC scan - PRBC transfusion, post transfusion Hgb, additional PRBC if needed - consideration for colonoscopy tomorrow if she has a colonic source identified or if the tagged RBC scan is nondiagnostic. - if tagged RBC scan identifies active bleeding, please let us know. If small bowel source would consult IR for embolization. If significant active bleeding regardless may need IR consultation  Will follow closely, please call with questions / concerns.  Harlin Rain, MD [facility]

## 2023-04-28 NOTE — Progress Notes (Signed)
Date and time results received: 04/28/23 0414 (use smartphrase ".now" to insert current time)  Test: Hgb 6.1 Critical Value: 6.1  Name of Provider Notified: Opyd  Orders Received? Or Actions Taken?: Orders Received - See Orders for details

## 2023-04-28 NOTE — Progress Notes (Signed)
Returns from IR, called lab for repeat H&H.

## 2023-04-28 NOTE — Progress Notes (Signed)
Hgb is 7.0 after transfusion. She continues to bleed. Plan to transfuse 1 unit RBC now, continue serial H&H.

## 2023-04-28 NOTE — Treatment Plan (Signed)
Called by radiology regarding positive bleeding scan and concerns of active bleed around the duodeunum or jejunal regions. Have contacted both GI and IR on call. Will f/u on those recs. Cont to follow Hgb trends and transfuse as needed for now

## 2023-04-28 NOTE — Progress Notes (Signed)
Progress Note   Patient: Mikayla [name]:[id number] DOB: [birth year] DOA: 04/23/2023     5 DOS: the patient was seen and examined on 04/28/2023   Brief hospital course: 89 year old female, presents from SNF for concern of abnormal labs and some left flank pain.  In the ED, labs notable for hemoglobin of 5.7 with baseline 9-10, BUN 135, with creatinine 1.8, baseline 1.3-1.5.  CT abdomen pelvis negative for acute findings.  2 units of PRBC ordered in the ED. GI consulted for further management.  Patient admitted for further management.  Of note, patient had recent left femur fracture surgery.   Assessment and Plan: Acute on chronic normocytic anemia secondary to acute blood loss anemia -Pt has required total 5 units of PRBC's this admit thus far -GI is following.  - ASA and plavix remain on hold for now -Pt now s/p EGD, unremarkable. Gross blood per rectum with concerns of active GI bleed -Plan for tagged RBC scan, if tagged scan identifies bleed, then would need IR for embolization -consider colon tomorrow to identify any colonic source   AKI on CKD stage IIIb Likely prerenal in etiology from dehydration.  Holding home Lasix and losartan. Cr improved with gentle hydration   Hyperkalemia In the setting of AKI on CKD stage IIIb.  Potassium initially 6.2 and EKG without acute changes.  Patient received albuterol, insulin, and Lokelma in the ED.  Hyperkalemia has now resolved    Acute encephalopathy CT head showing chronic heavily calcified meningioma and no acute intracranial abnormality.  No focal neurodeficit on exam.  -Mentation has normalized. Pt conversing very appropriately   Mild hyponatremia Normalized   Hypocalcemia ruled out Ca did correct to normal   Left flank pain CT abdomen pelvis negative for acute findings; no renal calculi or obstructive changes seen.   Leukocytosis No fever or signs of sepsis.  No obvious infectious source on CT abdomen pelvis.  Ordered  chest x-ray and UA. WBC improved   Chronic HFpEF Last echo done in September 2023 showing EF 55 to 60%, grade 2 diastolic dysfunction, moderate MVR.   Appears euvolemic this AM.   CAD status post PCI in September 2023 EKG without acute ischemic changes.  ASA and plavix currently on hold for now. Defer to GI when these can be resumed   Non-insulin-dependent type 2 diabetes Glucose in the 220s.  Last A1c 6.1 on 01/05/2023.  Ordered sensitive sliding scale insulin ACHS  Constipation -resolved     Subjective: Weak today. BRBPR noted per family. Currently receiving PRBC transfusion  Physical Exam: Vitals:   04/28/23 0930 04/28/23 0945 04/28/23 1152 04/28/23 1200  BP: (!) 154/43 (!) 129/40  (!) 165/49  Pulse: 90 87  80  Resp: 20 (!) 26  (!) 21  Temp:  97.8 F (36.6 C) 98.4 F (36.9 C) 99 F (37.2 C)  TempSrc:  Axillary Axillary Axillary  SpO2: 97% 100%  96%  Weight:      Height:       General exam: Conversant, in no acute distress Respiratory system: normal chest rise, clear, no audible wheezing Cardiovascular system: regular rhythm, s1-s2 Gastrointestinal system: Nondistended, nontender, pos BS Central nervous system: No seizures, no tremors Extremities: No cyanosis, no joint deformities Skin: No rashes, no pallor Psychiatry: Affect normal // no auditory hallucinations   Data Reviewed:  Labs reviewed: Na 141, K 4.0, Cr 1.42, Hgb 6.0  Family Communication: Pt in room, family not at bedside  Disposition: Status is: Inpatient Remains inpatient appropriate because:  Severity of illness  Planned Discharge Destination: Skilled nursing facility    Author: Rickey Barbara, MD 04/28/2023 2:15 PM  For on call review [email or web address].

## 2023-04-28 NOTE — Progress Notes (Signed)
To nuclear med with transport.

## 2023-04-28 NOTE — Plan of Care (Signed)
  Problem: Nutrition: Goal: Adequate nutrition will be maintained Outcome: Not Progressing   

## 2023-04-28 NOTE — Procedures (Signed)
  Procedure:  Mesenteric arteriogram SMA, IMA, Celiac. No extravasation localized. Preprocedure diagnosis: The primary encounter diagnosis was Hyperkalemia. A diagnosis of Anemia, unspecified type was also pertinent to this visit. Postprocedure diagnosis: same EBL:    minimal Complications:   none immediate  See full dictation in [employer].  Thora Lance MD Main # [phone] Pager  [phone] Mobile [phone]

## 2023-04-28 NOTE — Plan of Care (Signed)
Continues to have intermittent bright red rectal drainage, NPO for testing.

## 2023-04-29 DIAGNOSIS — N1832 Chronic kidney disease, stage 3b: Secondary | ICD-10-CM

## 2023-04-29 DIAGNOSIS — N179 Acute kidney failure, unspecified: Secondary | ICD-10-CM

## 2023-04-29 LAB — CULTURE, BLOOD (ROUTINE X 2)
Culture: NO GROWTH
Culture: NO GROWTH
Special Requests: ADEQUATE

## 2023-04-29 LAB — COMPREHENSIVE METABOLIC PANEL
ALT: 10 U/L (ref 0–44)
AST: 13 U/L — ABNORMAL LOW (ref 15–41)
Albumin: 1.6 g/dL — ABNORMAL LOW (ref 3.5–5.0)
Alkaline Phosphatase: 22 U/L — ABNORMAL LOW (ref 38–126)
Anion gap: 14 (ref 5–15)
BUN: 96 mg/dL — ABNORMAL HIGH (ref 8–23)
CO2: 15 mmol/L — ABNORMAL LOW (ref 22–32)
Calcium: 7.4 mg/dL — ABNORMAL LOW (ref 8.9–10.3)
Chloride: 115 mmol/L — ABNORMAL HIGH (ref 98–111)
Creatinine, Ser: 1.74 mg/dL — ABNORMAL HIGH (ref 0.44–1.00)
GFR, Estimated: 27 mL/min — ABNORMAL LOW (ref >60)
Glucose, Bld: 197 mg/dL — ABNORMAL HIGH (ref 70–99)
Potassium: 4.7 mmol/L (ref 3.5–5.1)
Sodium: 144 mmol/L (ref 135–145)
Total Bilirubin: 0.8 mg/dL (ref <1.2)
Total Protein: 3 g/dL — ABNORMAL LOW (ref 6.5–8.1)

## 2023-04-29 LAB — CBC
HCT: 21.2 % — ABNORMAL LOW (ref 36.0–46.0)
Hemoglobin: 7.2 g/dL — ABNORMAL LOW (ref 12.0–15.0)
MCH: 29.9 pg (ref 26.0–34.0)
MCHC: 34 g/dL (ref 30.0–36.0)
MCV: 88 fL (ref 80.0–100.0)
Platelets: 217 10*3/uL (ref 150–400)
RBC: 2.41 MIL/uL — ABNORMAL LOW (ref 3.87–5.11)
RDW: 17.2 % — ABNORMAL HIGH (ref 11.5–15.5)
WBC: 21.3 10*3/uL — ABNORMAL HIGH (ref 4.0–10.5)
nRBC: 0.6 % — ABNORMAL HIGH (ref 0.0–0.2)

## 2023-04-29 LAB — GLUCOSE, CAPILLARY: Glucose-Capillary: 165 mg/dL — ABNORMAL HIGH (ref 70–99)

## 2023-04-29 MED ORDER — LORAZEPAM 2 MG/ML IJ SOLN: 1.0000 mg | INTRAMUSCULAR | Status: DC | PRN

## 2023-04-29 MED ORDER — LORAZEPAM 1 MG PO TABS: 1.0000 mg | ORAL_TABLET | ORAL | Status: DC | PRN

## 2023-04-29 MED ORDER — LORAZEPAM 2 MG/ML PO CONC: 1.0000 mg | ORAL | Status: DC | PRN

## 2023-04-29 MED ORDER — MORPHINE SULFATE (PF) 2 MG/ML IV SOLN: 1.0000 mg | INTRAVENOUS | Status: DC | PRN

## 2023-05-01 ENCOUNTER — Encounter (HOSPITAL_COMMUNITY): Payer: Self-pay | Admitting: Gastroenterology

## 2023-05-01 LAB — TYPE AND SCREEN
ABO/RH(D): A NEG
Antibody Screen: POSITIVE
Donor AG Type: NEGATIVE
Donor AG Type: NEGATIVE
Donor AG Type: NEGATIVE
Donor AG Type: NEGATIVE
Unit division: 0
Unit division: 0
Unit division: 0
Unit division: 0

## 2023-05-01 LAB — BPAM RBC
Blood Product Expiration Date: 202501022359
Blood Product Expiration Date: 202501032359
Blood Product Expiration Date: 202501112359
Blood Product Expiration Date: 202501132359
ISSUE DATE / TIME: 202412140922
ISSUE DATE / TIME: 202412142026
Unit Type and Rh: 600
Unit Type and Rh: 600
Unit Type and Rh: 600
Unit Type and Rh: 600

## 2023-05-03 ENCOUNTER — Ambulatory Visit: Payer: Medicare Other | Admitting: Podiatry

## 2023-05-16 NOTE — Progress Notes (Signed)
   04/24/2023 0408  Spiritual Encounters  Type of Visit Initial  Care provided to: Pt and family  Conversation partners present during encounter Nurse  Referral source Nurse (RN/NT/LPN)  Reason for visit Patient death  OnCall Visit Yes  Interventions  Spiritual Care Interventions Made Compassionate presence;Bereavement/grief support;Prayer;Supported grief process   Chaplain called to bedside for Pt death.  Pt's son Mikayla [name]) at bedside asking for a blessing to be said over his mother. Chaplain provided prayer as well as facilitating brief life review.  Son seeking guidance on next steps and chaplain provided that information. Chaplain provided update to RN upon departure.  No further Spiritual Care services needed at this time.

## 2023-05-16 NOTE — Death Summary Note (Addendum)
Death Summary  Mikayla [name] OZH:[id number] DOB: 03/15/1926 DOA: Apr 28, 2023  PCP: Caesar Bookman, NP  Admit date: 04/28/2023 Date of Death: 04-May-2023 Time of Death: 03:41 Notification: Ngetich, Donalee Citrin, NP notified of death of 05-04-23   History of present illness:  Mikayla [name] is a 89 y.o. female with a history of hypertension, hyperlipidemia, type 2 diabetes, hypothyroidism, CAD, HFpEF, CKD 3B, chronic anemia, and recent admission for left femur fracture status-post operative repair who presented to the emergency department the night of 04-28-23 for evaluation of abnormal labs.  She had appeared pale at her SNF, had labs drawn, was found to have acute on chronic anemia and acute on chronic renal failure, and was sent to the ED for this.  She was found to have acute GI bleeding with acute blood loss anemia, acute on chronic renal insufficiency, and was admitted to the hospitalist service.  She was evaluated by gastroenterology and no source of blood loss was identified on EGD.  She had a positive tagged red blood cell scan last night and underwent mesenteric angiogram with IR, but no active bleed was identified.  Early this morning, patient became tachycardic, tachypneic, and hypotensive.  A fluid bolus was started but her blood pressure continued to decline, heart rate slowed as low as the 20s, and she became unresponsive.  Her son was at the bedside, confirmed that she would not want CPR or other heroic measures at this juncture, and felt that she would want [place] to just focus on keeping her comfortable at this point.  Fortunately, patient did not appear to be in pain, anxious, or dyspneic.  She is expired at 03:41 with her son at the bedside.  Final Diagnoses:  1.   Acute GI bleeding 2.   Acute blood-loss anemia  3.   AKI superimposed on CKD 3B   The results of significant diagnostics from this hospitalization (including imaging, microbiology, ancillary and laboratory)  are listed below for reference.    Significant Diagnostic Studies: NM GI Blood Loss Addendum Date: 04/28/2023 ADDENDUM REPORT: 04/28/2023 18:22 ADDENDUM: Critical Value/emergent results were called by telephone at the time of interpretation on 04/28/2023 at 6:22 pm to provider DR. Rhona Leavens, who verbally acknowledged these results. Electronically Signed   By: Delbert Phenix M.D.   On: 04/28/2023 18:22   Result Date: 04/28/2023 CLINICAL DATA:  89 year old female with GI bleed.  Inpatient. EXAM: NUCLEAR MEDICINE GASTROINTESTINAL BLEEDING SCAN TECHNIQUE: Sequential abdominal images were obtained following intravenous administration of Tc-43m labeled red blood cells. RADIOPHARMACEUTICALS:  24 mCi Tc-82m pertechnetate in-vitro labeled red cells. COMPARISON:  04-28-2023 CT abdomen/pelvis FINDINGS: Prominent tagged red blood cell activity is seen initially centrally in the abdomen on image 7 and subsequently within the left abdomen progressing through multiple bowel loops, favor duodenal or jejunal origin active GI bleed. IMPRESSION: Positive tagged red blood cell GI bleed scan with initial activity centrally within the abdomen and subsequent left abdominal bowel loop activity, favoring duodenal or jejunal origin active GI bleed. Electronically Signed: By: Delbert Phenix M.D. On: 04/28/2023 18:10   VAS [place] LOWER EXTREMITY VENOUS (DVT) Result Date: 04/25/2023  Lower Venous DVT Study Patient Name:  Mikayla [name]  Date of Exam:   04/25/2023 Medical Rec #: [id number]            Accession #:    [id number] Date of Birth: 1925/06/24             Patient Gender: F Patient Age:   89 years  Exam Location:  [facility] Procedure:      VAS [place] LOWER EXTREMITY VENOUS (DVT) Referring Phys: [facility] EZENDUKA --------------------------------------------------------------------------------  Indications: Swelling.  Risk Factors: Surgery Trauma. Limitations: Poor ultrasound/tissue interface and patient positioning, patient  immobility, patient pain tolerance. Comparison Study: No prior studies. Performing Technologist: Chanda Busing RVT  Examination Guidelines: A complete evaluation includes B-mode imaging, spectral Doppler, color Doppler, and power Doppler as needed of all accessible portions of each vessel. Bilateral testing is considered an integral part of a complete examination. Limited examinations for reoccurring indications may be performed as noted. The reflux portion of the exam is performed with the patient in reverse Trendelenburg.  +---------+---------------+---------+-----------+----------+--------------+ RIGHT    CompressibilityPhasicitySpontaneityPropertiesThrombus Aging +---------+---------------+---------+-----------+----------+--------------+ CFV      Full           Yes      Yes                                 +---------+---------------+---------+-----------+----------+--------------+ SFJ      Full                                                        +---------+---------------+---------+-----------+----------+--------------+ FV Prox  Full                                                        +---------+---------------+---------+-----------+----------+--------------+ FV Mid   Full                                                        +---------+---------------+---------+-----------+----------+--------------+ FV DistalFull                                                        +---------+---------------+---------+-----------+----------+--------------+ PFV      Full                                                        +---------+---------------+---------+-----------+----------+--------------+ POP      Full           Yes      Yes                                 +---------+---------------+---------+-----------+----------+--------------+ PTV      Full                                                         +---------+---------------+---------+-----------+----------+--------------+  PERO     Full                                                        +---------+---------------+---------+-----------+----------+--------------+   +---------+---------------+---------+-----------+----------+-------------------+ LEFT     CompressibilityPhasicitySpontaneityPropertiesThrombus Aging      +---------+---------------+---------+-----------+----------+-------------------+ CFV      Full           Yes      Yes                                      +---------+---------------+---------+-----------+----------+-------------------+ SFJ      Full                                                             +---------+---------------+---------+-----------+----------+-------------------+ FV Prox  Full                                                             +---------+---------------+---------+-----------+----------+-------------------+ FV Mid   Full                                                             +---------+---------------+---------+-----------+----------+-------------------+ FV DistalFull           Yes      Yes                                      +---------+---------------+---------+-----------+----------+-------------------+ PFV      Full                                                             +---------+---------------+---------+-----------+----------+-------------------+ POP      Full           Yes      Yes                                      +---------+---------------+---------+-----------+----------+-------------------+ PTV                                                   patency shown with  color doppler       +---------+---------------+---------+-----------+----------+-------------------+ PERO                                                  patency shown with                                                         color doppler       +---------+---------------+---------+-----------+----------+-------------------+     Summary: RIGHT: - There is no evidence of deep vein thrombosis in the lower extremity.  - No cystic structure found in the popliteal fossa.  LEFT: - There is no evidence of deep vein thrombosis in the lower extremity. However, portions of this examination were limited- see technologist comments above.  - No cystic structure found in the popliteal fossa.  *See table(s) above for measurements and observations. Electronically signed by Coral Else MD on 04/25/2023 at 9:06:37 PM.    Final    EEG adult Result Date: 04/24/2023 Charlsie Quest, MD     04/24/2023  1:19 PM Patient Name: Mikayla [name] MRN: [id number] Epilepsy Attending: Charlsie Quest Referring Physician/Provider: John Giovanni, MD Date: 04/24/2023 Duration: 21.58 mins Patient history: 89yo M with ams getting eeg to evaluate for seizure Level of alertness: Awake AEDs during EEG study: None Technical aspects: This EEG study was done with scalp electrodes positioned according to the 10-20 International system of electrode placement. Electrical activity was reviewed with band pass filter of 1-70Hz , sensitivity of 7 uV/mm, display speed of 41mm/sec with a 60Hz  notched filter applied as appropriate. EEG data were recorded continuously and digitally stored.  Video monitoring was available and reviewed as appropriate. Description: The posterior dominant rhythm consists of 7 Hz activity of moderate voltage (25-35 uV) seen predominantly in posterior head regions, symmetric and reactive to eye opening and eye closing. EEG showed continuous generalized 5 to 6 Hz theta slowing. Hyperventilation and photic stimulation were not performed.   ABNORMALITY - Continuous slow, generalized IMPRESSION: This study is suggestive of mild to moderate diffuse encephalopathy. No seizures or epileptiform discharges were  seen throughout the recording. Charlsie Quest   DG CHEST PORT 1 VIEW Result Date: 04/24/2023 CLINICAL DATA:  Shortness of breath EXAM: PORTABLE CHEST 1 VIEW COMPARISON:  04/12/2023 FINDINGS: Cardiac shadow is within normal limits. Aortic calcifications are seen. Lungs are well aerated bilaterally. No focal infiltrate is seen. No bony abnormality is noted. IMPRESSION: No active disease. Electronically Signed   By: Alcide Clever M.D.   On: 04/24/2023 02:38   CT ABDOMEN PELVIS WO CONTRAST Result Date: 04/23/2023 CLINICAL DATA:  Left-sided flank pain EXAM: CT ABDOMEN AND PELVIS WITHOUT CONTRAST TECHNIQUE: Multidetector CT imaging of the abdomen and pelvis was performed following the standard protocol without IV contrast. RADIATION DOSE REDUCTION: This exam was performed according to the departmental dose-optimization program which includes automated exposure control, adjustment of the mA and/or kV according to patient size and/or use of iterative reconstruction technique. COMPARISON:  06/24/2008 FINDINGS: Lower chest: No acute abnormality. Hepatobiliary: No focal liver abnormality is seen. No gallstones, gallbladder wall thickening, or biliary dilatation. Pancreas: Unremarkable. No pancreatic ductal dilatation or surrounding inflammatory changes. Spleen: Normal in  size without focal abnormality. Adrenals/Urinary Tract: Adrenal glands are within normal limits. Kidneys are well visualized bilaterally. Mild atrophic changes are noted in the left kidney. No renal calculi or obstructive changes are seen. The bladder is partially distended. Stomach/Bowel: Scattered fecal material is noted throughout the colon. No obstructive changes are seen. The appendix is within normal limits. Small bowel and stomach are unremarkable. Vascular/Lymphatic: Aortic atherosclerosis. No enlarged abdominal or pelvic lymph nodes. Reproductive: Status post hysterectomy. No adnexal masses. Other: No abdominal wall hernia or abnormality. No  abdominopelvic ascites. Musculoskeletal: Prior operative fixation of a left femoral fracture is seen. Degenerative changes of the lumbar spine are noted. No acute bony abnormality is noted. Chronic L1 compression deformity is seen. IMPRESSION: Chronic changes as described above. No acute abnormality correspond with the given clinical history. Electronically Signed   By: Alcide Clever M.D.   On: 04/23/2023 20:49   CT Head Wo Contrast Result Date: 04/23/2023 CLINICAL DATA:  Recent memory loss, initial encounter EXAM: CT HEAD WITHOUT CONTRAST TECHNIQUE: Contiguous axial images were obtained from the base of the skull through the vertex without intravenous contrast. RADIATION DOSE REDUCTION: This exam was performed according to the departmental dose-optimization program which includes automated exposure control, adjustment of the mA and/or kV according to patient size and/or use of iterative reconstruction technique. COMPARISON:  01/10/2005 FINDINGS: Brain: Chronic atrophic changes are noted. Dystrophic calcification is noted in the deep white matter on the left. No acute hemorrhage or acute infarction is seen. Heavily calcified lesion is noted arising from the right sphenoid wing adjacent to the sella turcica consistent with a meningioma. This measures 2.1 x 2.1 by 1.6 cm in greatest AP, transverse and craniocaudad projections. When compared with the prior sinus CT from 2006 the lesion was present although incompletely evaluated on that limited exam. Vascular: No hyperdense vessel or unexpected calcification. Skull: Normal. Negative for fracture or focal lesion. Sinuses/Orbits: No acute finding. Other: None. IMPRESSION: Heavily calcified meningioma along the medial aspect of the right sphenoid wing adjacent to the sella turcica. This is a chronic finding as it was present in 2006 although incompletely evaluated. Mild atrophic changes without acute abnormality. Electronically Signed   By: Alcide Clever M.D.   On:  04/23/2023 20:46   DG Pelvis Portable Result Date: 04/13/2023 CLINICAL DATA:  Close left subtrochanteric femur fracture. Postoperative radiographs. EXAM: PORTABLE PELVIS 1-2 VIEWS; LEFT FEMUR PORTABLE 2 VIEWS COMPARISON:  Left femur intraoperative fluoroscopy 04/13/2023, pelvis and left femur radiographs 04/12/2023 FINDINGS: There is diffuse decreased bone mineralization. Marrow anterolateral femoroacetabular joint space narrowing. Moderate severe pubic symphysis joint space narrowing, subchondral sclerosis, peripheral osteophytosis. Mild-to-moderate right and mild left sacroiliac joint space narrowing with mild subchondral sclerosis. New long left intramedullary nail fixation of the previously seen comminuted fracture of the proximal femoral diaphysis predominantly from a superolateral to inferior medial orientation. Improved alignment with resolution of the prior mild varus angulation of the fracture. There is again a comminuted butterfly fracture fragment at the proximal lateral aspect of the fracture. No evidence of hardware failure. Moderate to severe lateral knee compartment joint space narrowing, subchondral sclerosis, peripheral osteophytosis. Moderate to severe patellofemoral joint space narrowing with moderate peripheral osteophytes. Moderate to high-grade atherosclerotic calcifications. Expected postoperative thigh subcutaneous air. Lateral proximal distal left thigh surgical staples. IMPRESSION: New long left intramedullary nail fixation of the previously seen comminuted fracture of the proximal femoral diaphysis. Improved alignment with resolution of the prior mild varus angulation of the fracture. No evidence of hardware failure.  Electronically Signed   By: Neita Garnet M.D.   On: 04/13/2023 12:14   DG FEMUR PORT MIN 2 VIEWS LEFT Result Date: 04/13/2023 CLINICAL DATA:  Close left subtrochanteric femur fracture. Postoperative radiographs. EXAM: PORTABLE PELVIS 1-2 VIEWS; LEFT FEMUR PORTABLE 2  VIEWS COMPARISON:  Left femur intraoperative fluoroscopy 04/13/2023, pelvis and left femur radiographs 04/12/2023 FINDINGS: There is diffuse decreased bone mineralization. Marrow anterolateral femoroacetabular joint space narrowing. Moderate severe pubic symphysis joint space narrowing, subchondral sclerosis, peripheral osteophytosis. Mild-to-moderate right and mild left sacroiliac joint space narrowing with mild subchondral sclerosis. New long left intramedullary nail fixation of the previously seen comminuted fracture of the proximal femoral diaphysis predominantly from a superolateral to inferior medial orientation. Improved alignment with resolution of the prior mild varus angulation of the fracture. There is again a comminuted butterfly fracture fragment at the proximal lateral aspect of the fracture. No evidence of hardware failure. Moderate to severe lateral knee compartment joint space narrowing, subchondral sclerosis, peripheral osteophytosis. Moderate to severe patellofemoral joint space narrowing with moderate peripheral osteophytes. Moderate to high-grade atherosclerotic calcifications. Expected postoperative thigh subcutaneous air. Lateral proximal distal left thigh surgical staples. IMPRESSION: New long left intramedullary nail fixation of the previously seen comminuted fracture of the proximal femoral diaphysis. Improved alignment with resolution of the prior mild varus angulation of the fracture. No evidence of hardware failure. Electronically Signed   By: Neita Garnet M.D.   On: 04/13/2023 12:14   DG FEMUR MIN 2 VIEWS LEFT Result Date: 04/13/2023 CLINICAL DATA:  Elective surgery. EXAM: LEFT FEMUR 2 VIEWS COMPARISON:  Preoperative imaging FINDINGS: Eight fluoroscopic spot views of the left femur obtained in the operating room. Skull intra limit jet is during femoral intramedullary nail with trans trochanteric and distal locking screw fixation traversing proximal femur fracture. Fluoroscopy time  1 minutes 40 seconds. Dose 11.5 mGy IMPRESSION: Intraoperative fluoroscopy during proximal femur fracture fixation. Electronically Signed   By: Narda Rutherford M.D.   On: 04/13/2023 11:02   DG C-Arm 1-60 Min-No Report Result Date: 04/13/2023 Fluoroscopy was utilized by the requesting physician.  No radiographic interpretation.   DG C-Arm 1-60 Min-No Report Result Date: 04/13/2023 Fluoroscopy was utilized by the requesting physician.  No radiographic interpretation.   DG Chest Port 1 View Result Date: 04/12/2023 CLINICAL DATA:  [id number]. Witnessed fall injury, left lower extremity shortening and pain, unable to move left lower extremity. EXAM: LEFT FEMUR 2 VIEWS; PORTABLE CHEST - 1 VIEW; RIGHT SHOULDER - 2+ VIEW; PELVIS - 1-2 VIEW COMPARISON:  PA and lateral chest 07/14/2022, and CT abdomen and pelvis and reformats 06/24/2008 FINDINGS: Chest AP portable 8:05 p.m.: The heart is mildly enlarged. No vascular congestion is seen. The aorta is tortuous and calcified with stable mediastinum. The lungs are clear. No displaced rib fracture or pneumothorax are seen. Osteopenia, degenerative change and mild levoscoliosis thoracic spine. No new osseous findings. There is a tangle of overlying monitor wiring. Right shoulder, AP and transscapular Y-views only: Osteopenia. There is no evidence of fracture or dislocation. There are degenerative subcortical cystic changes along the humeral cuff insertion, slight spurring at the [facility] joint and inferior glenohumeral joint. There is a somewhat high-riding appearance of the humerus which could be positional or due to chronic rotator cuff arthropathy. AP pelvis single view: Osteopenia. There is an acute oblique subtrochanteric proximal left femoral fracture, with mild medial angulation of the main distal fragment and less than 1 cortex width lateral displacement. There is a butterfly comminution fragment at the lateral proximal  fracture margin which is somewhat laterally rotated.  The pelvic rings are intact AP. No pelvic fracture or diastasis is seen. There are osteophytes of the SI joints, pubic symphysis. The visualized proximal right femur is unremarkable. There is mild arthrosis of both hips, pelvic enthesopathy. The iliofemoral arteries are heavily calcified. There is advanced degenerative change of the visualized lower lumbar spine. Multiple pelvic phleboliths. AP and cross-table lateral views left femur, total of 4 films: Above-described proximal femoral fracture is redemonstrated, with mild angulation and slight displacement. No fracture is seen in the remainder of the femur. There is moderate arthrosis at the knee. No suprapatellar effusion is evident. Heavy calcification continues down the superficial femoral artery and into the popliteal and popliteal trifurcation arteries. IMPRESSION: 1. Acute oblique subtrochanteric proximal left femoral fracture with mild angulation and slight displacement. 2. No fracture is seen in the remainder of the left femur. 3. No pelvic fracture or diastasis is seen. 4. No acute radiographic chest findings. Mild cardiomegaly. 5. Osteopenia and degenerative change right shoulder without evidence of fracture. 6. Heavy aortic and peripheral vascular atherosclerosis. Electronically Signed   By: Almira Bar M.D.   On: 04/12/2023 20:46   DG Shoulder Right Result Date: 04/12/2023 CLINICAL DATA:  [id number]. Witnessed fall injury, left lower extremity shortening and pain, unable to move left lower extremity. EXAM: LEFT FEMUR 2 VIEWS; PORTABLE CHEST - 1 VIEW; RIGHT SHOULDER - 2+ VIEW; PELVIS - 1-2 VIEW COMPARISON:  PA and lateral chest 07/14/2022, and CT abdomen and pelvis and reformats 06/24/2008 FINDINGS: Chest AP portable 8:05 p.m.: The heart is mildly enlarged. No vascular congestion is seen. The aorta is tortuous and calcified with stable mediastinum. The lungs are clear. No displaced rib fracture or pneumothorax are seen. Osteopenia, degenerative change  and mild levoscoliosis thoracic spine. No new osseous findings. There is a tangle of overlying monitor wiring. Right shoulder, AP and transscapular Y-views only: Osteopenia. There is no evidence of fracture or dislocation. There are degenerative subcortical cystic changes along the humeral cuff insertion, slight spurring at the [facility] joint and inferior glenohumeral joint. There is a somewhat high-riding appearance of the humerus which could be positional or due to chronic rotator cuff arthropathy. AP pelvis single view: Osteopenia. There is an acute oblique subtrochanteric proximal left femoral fracture, with mild medial angulation of the main distal fragment and less than 1 cortex width lateral displacement. There is a butterfly comminution fragment at the lateral proximal fracture margin which is somewhat laterally rotated. The pelvic rings are intact AP. No pelvic fracture or diastasis is seen. There are osteophytes of the SI joints, pubic symphysis. The visualized proximal right femur is unremarkable. There is mild arthrosis of both hips, pelvic enthesopathy. The iliofemoral arteries are heavily calcified. There is advanced degenerative change of the visualized lower lumbar spine. Multiple pelvic phleboliths. AP and cross-table lateral views left femur, total of 4 films: Above-described proximal femoral fracture is redemonstrated, with mild angulation and slight displacement. No fracture is seen in the remainder of the femur. There is moderate arthrosis at the knee. No suprapatellar effusion is evident. Heavy calcification continues down the superficial femoral artery and into the popliteal and popliteal trifurcation arteries. IMPRESSION: 1. Acute oblique subtrochanteric proximal left femoral fracture with mild angulation and slight displacement. 2. No fracture is seen in the remainder of the left femur. 3. No pelvic fracture or diastasis is seen. 4. No acute radiographic chest findings. Mild cardiomegaly. 5.  Osteopenia and degenerative change right shoulder without evidence of  fracture. 6. Heavy aortic and peripheral vascular atherosclerosis. Electronically Signed   By: Almira Bar M.D.   On: 04/12/2023 20:46   DG Pelvis 1-2 Views Result Date: 04/12/2023 CLINICAL DATA:  [id number]. Witnessed fall injury, left lower extremity shortening and pain, unable to move left lower extremity. EXAM: LEFT FEMUR 2 VIEWS; PORTABLE CHEST - 1 VIEW; RIGHT SHOULDER - 2+ VIEW; PELVIS - 1-2 VIEW COMPARISON:  PA and lateral chest 07/14/2022, and CT abdomen and pelvis and reformats 06/24/2008 FINDINGS: Chest AP portable 8:05 p.m.: The heart is mildly enlarged. No vascular congestion is seen. The aorta is tortuous and calcified with stable mediastinum. The lungs are clear. No displaced rib fracture or pneumothorax are seen. Osteopenia, degenerative change and mild levoscoliosis thoracic spine. No new osseous findings. There is a tangle of overlying monitor wiring. Right shoulder, AP and transscapular Y-views only: Osteopenia. There is no evidence of fracture or dislocation. There are degenerative subcortical cystic changes along the humeral cuff insertion, slight spurring at the [facility] joint and inferior glenohumeral joint. There is a somewhat high-riding appearance of the humerus which could be positional or due to chronic rotator cuff arthropathy. AP pelvis single view: Osteopenia. There is an acute oblique subtrochanteric proximal left femoral fracture, with mild medial angulation of the main distal fragment and less than 1 cortex width lateral displacement. There is a butterfly comminution fragment at the lateral proximal fracture margin which is somewhat laterally rotated. The pelvic rings are intact AP. No pelvic fracture or diastasis is seen. There are osteophytes of the SI joints, pubic symphysis. The visualized proximal right femur is unremarkable. There is mild arthrosis of both hips, pelvic enthesopathy. The iliofemoral arteries are  heavily calcified. There is advanced degenerative change of the visualized lower lumbar spine. Multiple pelvic phleboliths. AP and cross-table lateral views left femur, total of 4 films: Above-described proximal femoral fracture is redemonstrated, with mild angulation and slight displacement. No fracture is seen in the remainder of the femur. There is moderate arthrosis at the knee. No suprapatellar effusion is evident. Heavy calcification continues down the superficial femoral artery and into the popliteal and popliteal trifurcation arteries. IMPRESSION: 1. Acute oblique subtrochanteric proximal left femoral fracture with mild angulation and slight displacement. 2. No fracture is seen in the remainder of the left femur. 3. No pelvic fracture or diastasis is seen. 4. No acute radiographic chest findings. Mild cardiomegaly. 5. Osteopenia and degenerative change right shoulder without evidence of fracture. 6. Heavy aortic and peripheral vascular atherosclerosis. Electronically Signed   By: Almira Bar M.D.   On: 04/12/2023 20:46   DG FEMUR MIN 2 VIEWS LEFT Result Date: 04/12/2023 CLINICAL DATA:  [id number]. Witnessed fall injury, left lower extremity shortening and pain, unable to move left lower extremity. EXAM: LEFT FEMUR 2 VIEWS; PORTABLE CHEST - 1 VIEW; RIGHT SHOULDER - 2+ VIEW; PELVIS - 1-2 VIEW COMPARISON:  PA and lateral chest 07/14/2022, and CT abdomen and pelvis and reformats 06/24/2008 FINDINGS: Chest AP portable 8:05 p.m.: The heart is mildly enlarged. No vascular congestion is seen. The aorta is tortuous and calcified with stable mediastinum. The lungs are clear. No displaced rib fracture or pneumothorax are seen. Osteopenia, degenerative change and mild levoscoliosis thoracic spine. No new osseous findings. There is a tangle of overlying monitor wiring. Right shoulder, AP and transscapular Y-views only: Osteopenia. There is no evidence of fracture or dislocation. There are degenerative subcortical  cystic changes along the humeral cuff insertion, slight spurring at the [facility] joint and inferior glenohumeral  joint. There is a somewhat high-riding appearance of the humerus which could be positional or due to chronic rotator cuff arthropathy. AP pelvis single view: Osteopenia. There is an acute oblique subtrochanteric proximal left femoral fracture, with mild medial angulation of the main distal fragment and less than 1 cortex width lateral displacement. There is a butterfly comminution fragment at the lateral proximal fracture margin which is somewhat laterally rotated. The pelvic rings are intact AP. No pelvic fracture or diastasis is seen. There are osteophytes of the SI joints, pubic symphysis. The visualized proximal right femur is unremarkable. There is mild arthrosis of both hips, pelvic enthesopathy. The iliofemoral arteries are heavily calcified. There is advanced degenerative change of the visualized lower lumbar spine. Multiple pelvic phleboliths. AP and cross-table lateral views left femur, total of 4 films: Above-described proximal femoral fracture is redemonstrated, with mild angulation and slight displacement. No fracture is seen in the remainder of the femur. There is moderate arthrosis at the knee. No suprapatellar effusion is evident. Heavy calcification continues down the superficial femoral artery and into the popliteal and popliteal trifurcation arteries. IMPRESSION: 1. Acute oblique subtrochanteric proximal left femoral fracture with mild angulation and slight displacement. 2. No fracture is seen in the remainder of the left femur. 3. No pelvic fracture or diastasis is seen. 4. No acute radiographic chest findings. Mild cardiomegaly. 5. Osteopenia and degenerative change right shoulder without evidence of fracture. 6. Heavy aortic and peripheral vascular atherosclerosis. Electronically Signed   By: Almira Bar M.D.   On: 04/12/2023 20:46    Microbiology: Recent Results (from the past 240  hours)  Culture, blood (Routine X 2) w Reflex to ID Panel     Status: None (Preliminary result)   Collection Time: 04/24/23  2:53 AM   Specimen: BLOOD  Result Value Ref Range Status   Specimen Description BLOOD SITE NOT SPECIFIED  Final   Special Requests   Final    BOTTLES DRAWN AEROBIC ONLY Blood Culture adequate volume   Culture   Final    NO GROWTH 4 DAYS Performed at [facility], [address], [place], [place] [address]    Report Status PENDING  Incomplete  Culture, blood (Routine X 2) w Reflex to ID Panel     Status: None (Preliminary result)   Collection Time: 04/24/23  2:53 AM   Specimen: BLOOD  Result Value Ref Range Status   Specimen Description BLOOD SITE NOT SPECIFIED  Final   Special Requests   Final    BOTTLES DRAWN AEROBIC ONLY Blood Culture results may not be optimal due to an inadequate volume of blood received in culture bottles   Culture   Final    NO GROWTH 4 DAYS Performed at [facility], [address] [place], [place] [address]    Report Status PENDING  Incomplete     Labs: Basic Metabolic Panel: Recent Labs  Lab 04/25/23 0424 04/26/23 0306 04/27/23 0327 04/28/23 0322 05/11/2023 0200  NA 134* 137 142 141 144  K 4.6 4.4 4.6 4.0 4.7  CL 108 111 115* 117* 115*  CO2 19* 19* 19* 20* 15*  GLUCOSE 114* 138* 161* 160* 197*  BUN 118* 121* 117* 93* 96*  CREATININE 1.88* 1.63* 1.57* 1.42* 1.74*  CALCIUM 7.9* 7.8* 8.0* 7.6* 7.4*   Liver Function Tests: Recent Labs  Lab 04/24/23 0253 04/26/23 0306 04/27/23 0327 04/28/23 0322 04/24/2023 0200  AST 15 10* 9* 12* 13*  ALT 9 9 7 11  10  ALKPHOS 27* 26* 25* 29* 22*  BILITOT 0.6 0.6 0.7 0.8 0.8  PROT 4.4* 4.1* 3.9* 4.0* 3.0*  ALBUMIN 2.1* 2.0* 1.9* 2.0* 1.6*   No results for input(s): "LIPASE", "AMYLASE" in the last 168 hours. No results for input(s): "AMMONIA" in the last 168 hours. CBC: Recent Labs  Lab 04/25/23 0424 04/26/23 0805 04/26/23 1836 04/27/23 0327 04/27/23 1207  04/28/23 0322 04/28/23 0529 04/28/23 1835 05/05/2023 0200  WBC 14.0* 12.8*  --  13.2*  --  10.5  --   --  21.3*  NEUTROABS 10.4* 9.8*  --   --   --   --   --   --   --   HGB 7.6* 5.4*   < > 7.5* 7.5* 6.1* 6.0* 7.0* 7.2*  HCT 23.3* 16.7*   < > 21.8* 22.3* 18.7* 18.1* 20.6* 21.2*  MCV 90.3 92.8  --  89.3  --  93.0  --   --  88.0  PLT 237 247  --  217  --  239  --   --  217   < > = values in this interval not displayed.   Cardiac Enzymes: No results for input(s): "CKTOTAL", "CKMB", "CKMBINDEX", "TROPONINI" in the last 168 hours. D-Dimer No results for input(s): "DDIMER" in the last 72 hours. BNP: Invalid input(s): "POCBNP" CBG: Recent Labs  Lab 04/28/23 0725 04/28/23 1128 04/28/23 1838 04/28/23 2335 04/15/2023 0304  GLUCAP 141* 125* 165* 154* 165*   Anemia work up No results for input(s): "VITAMINB12", "FOLATE", "FERRITIN", "TIBC", "IRON", "RETICCTPCT" in the last 72 hours. Urinalysis    Component Value Date/Time   COLORURINE YELLOW 04/24/2023 0553   APPEARANCEUR CLEAR 04/24/2023 0553   LABSPEC 1.012 04/24/2023 0553   PHURINE 5.0 04/24/2023 0553   GLUCOSEU NEGATIVE 04/24/2023 0553   GLUCOSEU NEGATIVE 03/03/2014 1040   HGBUR NEGATIVE 04/24/2023 0553   BILIRUBINUR NEGATIVE 04/24/2023 0553   KETONESUR NEGATIVE 04/24/2023 0553   PROTEINUR NEGATIVE 04/24/2023 0553   UROBILINOGEN 0.2 03/03/2014 1040   NITRITE NEGATIVE 04/24/2023 0553   LEUKOCYTESUR NEGATIVE 04/24/2023 0553   Sepsis Labs Recent Labs  Lab 04/26/23 0805 04/27/23 0327 04/28/23 0322 05/02/2023 0200  WBC 12.8* 13.2* 10.5 21.3*       SIGNED:  Briscoe Deutscher, MD  [facility] 04/22/2023, 3:42 AM Pager   If 7PM-7AM, please contact night-coverage [email or web address] Password [email or web address]

## 2023-05-16 NOTE — Significant Event (Signed)
Patient was seen for change in status.   She became pale with rapid respirations, rapid heart rate, and hypotension. Fluid bolus was started but BP continued to decline, HR dropped to 20s-30s, and RR fell to ~5/min.   She appears to be actively dying.   Her son is at the bedside, confirms that the patient would not want CPR, and believes she would want [place] to just focus on comfort at this juncture.   The patient's son agrees with turning off the monitors and focusing only on the patient's comfort.

## 2023-05-16 DEATH — deceased

## 2023-07-10 ENCOUNTER — Ambulatory Visit: Payer: Medicare Other | Admitting: Family

## 2023-10-24 ENCOUNTER — Ambulatory Visit: Payer: Medicare Other | Admitting: Cardiology

## 2023-12-17 ENCOUNTER — Encounter: Payer: Medicare Other | Admitting: Family
